# Patient Record
Sex: Female | Born: 1950 | Race: White | Hispanic: No | Marital: Single | Smoking: Current every day smoker
Health system: Southern US, Community
[De-identification: ages and names within clinical notes are randomized; demographics above are authoritative.]

## PROBLEM LIST (undated history)

## (undated) DIAGNOSIS — D696 Thrombocytopenia, unspecified: Secondary | ICD-10-CM

## (undated) DIAGNOSIS — J449 Chronic obstructive pulmonary disease, unspecified: Secondary | ICD-10-CM

## (undated) DIAGNOSIS — I509 Heart failure, unspecified: Secondary | ICD-10-CM

## (undated) DIAGNOSIS — E039 Hypothyroidism, unspecified: Secondary | ICD-10-CM

## (undated) DIAGNOSIS — I499 Cardiac arrhythmia, unspecified: Secondary | ICD-10-CM

## (undated) DIAGNOSIS — G250 Essential tremor: Secondary | ICD-10-CM

## (undated) DIAGNOSIS — N10 Acute tubulo-interstitial nephritis: Secondary | ICD-10-CM

## (undated) DIAGNOSIS — F419 Anxiety disorder, unspecified: Secondary | ICD-10-CM

## (undated) DIAGNOSIS — R509 Fever, unspecified: Secondary | ICD-10-CM

## (undated) DIAGNOSIS — Z8619 Personal history of other infectious and parasitic diseases: Secondary | ICD-10-CM

## (undated) DIAGNOSIS — J9611 Chronic respiratory failure with hypoxia: Secondary | ICD-10-CM

## (undated) DIAGNOSIS — R32 Unspecified urinary incontinence: Secondary | ICD-10-CM

## (undated) DIAGNOSIS — E785 Hyperlipidemia, unspecified: Secondary | ICD-10-CM

## (undated) DIAGNOSIS — N1330 Unspecified hydronephrosis: Secondary | ICD-10-CM

## (undated) DIAGNOSIS — L309 Dermatitis, unspecified: Secondary | ICD-10-CM

## (undated) DIAGNOSIS — Z8489 Family history of other specified conditions: Secondary | ICD-10-CM

## (undated) DIAGNOSIS — T8859XA Other complications of anesthesia, initial encounter: Secondary | ICD-10-CM

## (undated) DIAGNOSIS — T4145XA Adverse effect of unspecified anesthetic, initial encounter: Secondary | ICD-10-CM

## (undated) DIAGNOSIS — F319 Bipolar disorder, unspecified: Secondary | ICD-10-CM

## (undated) HISTORY — PX: OTHER SURGICAL HISTORY: SHX169

## (undated) HISTORY — PX: EYE SURGERY: SHX253

---

## 2008-07-06 ENCOUNTER — Ambulatory Visit (HOSPITAL_COMMUNITY): Admission: RE | Admit: 2008-07-06 | Discharge: 2008-07-06 | Payer: Self-pay | Admitting: Unknown Physician Specialty

## 2008-07-19 ENCOUNTER — Ambulatory Visit (HOSPITAL_COMMUNITY): Admission: RE | Admit: 2008-07-19 | Discharge: 2008-07-19 | Payer: Self-pay | Admitting: Unknown Physician Specialty

## 2013-12-02 HISTORY — PX: COLONOSCOPY: SHX174

## 2016-07-15 HISTORY — PX: CARDIAC CATHETERIZATION: SHX172

## 2016-08-18 ENCOUNTER — Other Ambulatory Visit (HOSPITAL_COMMUNITY): Payer: Self-pay | Admitting: Family Medicine

## 2016-08-18 DIAGNOSIS — Z1231 Encounter for screening mammogram for malignant neoplasm of breast: Secondary | ICD-10-CM

## 2016-08-31 ENCOUNTER — Encounter (HOSPITAL_COMMUNITY): Payer: Self-pay | Admitting: Radiology

## 2016-08-31 ENCOUNTER — Ambulatory Visit (HOSPITAL_COMMUNITY)
Admission: RE | Admit: 2016-08-31 | Discharge: 2016-08-31 | Disposition: A | Discharge status: Home / Self Care | Payer: Medicare Other | Source: Ambulatory Visit | Attending: Family Medicine | Admitting: Family Medicine

## 2016-08-31 DIAGNOSIS — Z1231 Encounter for screening mammogram for malignant neoplasm of breast: Secondary | ICD-10-CM | POA: Insufficient documentation

## 2017-08-05 ENCOUNTER — Emergency Department (HOSPITAL_COMMUNITY): Payer: Medicare Other

## 2017-08-05 ENCOUNTER — Other Ambulatory Visit: Payer: Self-pay

## 2017-08-05 ENCOUNTER — Inpatient Hospital Stay (HOSPITAL_COMMUNITY)
Admission: EM | Admit: 2017-08-05 | Discharge: 2017-08-08 | DRG: 193 | Disposition: A | Discharge status: Nursing Home (Includes ICF) | Payer: Medicare Other | Attending: Internal Medicine | Admitting: Internal Medicine

## 2017-08-05 DIAGNOSIS — J440 Chronic obstructive pulmonary disease with acute lower respiratory infection: Secondary | ICD-10-CM | POA: Diagnosis present

## 2017-08-05 DIAGNOSIS — R410 Disorientation, unspecified: Secondary | ICD-10-CM

## 2017-08-05 DIAGNOSIS — R4182 Altered mental status, unspecified: Secondary | ICD-10-CM | POA: Diagnosis present

## 2017-08-05 DIAGNOSIS — F319 Bipolar disorder, unspecified: Secondary | ICD-10-CM | POA: Diagnosis present

## 2017-08-05 DIAGNOSIS — G250 Essential tremor: Secondary | ICD-10-CM | POA: Diagnosis present

## 2017-08-05 DIAGNOSIS — G9341 Metabolic encephalopathy: Secondary | ICD-10-CM | POA: Diagnosis present

## 2017-08-05 DIAGNOSIS — J189 Pneumonia, unspecified organism: Secondary | ICD-10-CM | POA: Diagnosis not present

## 2017-08-05 DIAGNOSIS — J9622 Acute and chronic respiratory failure with hypercapnia: Secondary | ICD-10-CM | POA: Diagnosis present

## 2017-08-05 DIAGNOSIS — Y95 Nosocomial condition: Secondary | ICD-10-CM | POA: Diagnosis present

## 2017-08-05 DIAGNOSIS — J9621 Acute and chronic respiratory failure with hypoxia: Secondary | ICD-10-CM | POA: Diagnosis present

## 2017-08-05 DIAGNOSIS — F17200 Nicotine dependence, unspecified, uncomplicated: Secondary | ICD-10-CM | POA: Diagnosis present

## 2017-08-05 DIAGNOSIS — Z7982 Long term (current) use of aspirin: Secondary | ICD-10-CM

## 2017-08-05 DIAGNOSIS — Z79899 Other long term (current) drug therapy: Secondary | ICD-10-CM

## 2017-08-05 DIAGNOSIS — Z79890 Hormone replacement therapy (postmenopausal): Secondary | ICD-10-CM

## 2017-08-05 DIAGNOSIS — Z7951 Long term (current) use of inhaled steroids: Secondary | ICD-10-CM

## 2017-08-05 HISTORY — DX: Chronic obstructive pulmonary disease, unspecified: J44.9

## 2017-08-05 HISTORY — DX: Dermatitis, unspecified: L30.9

## 2017-08-05 HISTORY — DX: Cardiac arrhythmia, unspecified: I49.9

## 2017-08-05 HISTORY — DX: Hyperlipidemia, unspecified: E78.5

## 2017-08-05 HISTORY — DX: Anxiety disorder, unspecified: F41.9

## 2017-08-05 HISTORY — DX: Bipolar disorder, unspecified: F31.9

## 2017-08-05 HISTORY — DX: Unspecified urinary incontinence: R32

## 2017-08-05 LAB — COMPREHENSIVE METABOLIC PANEL
ALBUMIN: 3.5 g/dL (ref 3.5–5.0)
ALK PHOS: 43 U/L (ref 38–126)
ALT: 9 U/L — AB (ref 14–54)
AST: 16 U/L (ref 15–41)
Anion gap: 10 (ref 5–15)
BUN: 19 mg/dL (ref 6–20)
CHLORIDE: 105 mmol/L (ref 101–111)
CO2: 25 mmol/L (ref 22–32)
Calcium: 8.1 mg/dL — ABNORMAL LOW (ref 8.9–10.3)
Creatinine, Ser: 0.88 mg/dL (ref 0.44–1.00)
GFR calc Af Amer: >60 mL/min (ref >60)
GFR calc non Af Amer: >60 mL/min (ref >60)
GLUCOSE: 133 mg/dL — AB (ref 65–99)
Potassium: 3.9 mmol/L (ref 3.5–5.1)
SODIUM: 140 mmol/L (ref 135–145)
Total Bilirubin: 0.9 mg/dL (ref 0.3–1.2)
Total Protein: 5.9 g/dL — ABNORMAL LOW (ref 6.5–8.1)

## 2017-08-05 LAB — BLOOD GAS, ARTERIAL
Acid-Base Excess: 0 mmol/L (ref 0.0–2.0)
BICARBONATE: 23.4 mmol/L (ref 20.0–28.0)
DRAWN BY: 317771
O2 CONTENT: 4 L/min
O2 Saturation: 93.1 %
PO2 ART: 71.4 mmHg — AB (ref 83.0–108.0)
pCO2 arterial: 56.4 mmHg — ABNORMAL HIGH (ref 32.0–48.0)
pH, Arterial: 7.284 — ABNORMAL LOW (ref 7.350–7.450)

## 2017-08-05 LAB — I-STAT CHEM 8, ED
BUN: 18 mg/dL (ref 6–20)
CALCIUM ION: 1.13 mmol/L — AB (ref 1.15–1.40)
CHLORIDE: 104 mmol/L (ref 101–111)
CREATININE: 0.9 mg/dL (ref 0.44–1.00)
Glucose, Bld: 128 mg/dL — ABNORMAL HIGH (ref 65–99)
HEMATOCRIT: 36 % (ref 36.0–46.0)
Hemoglobin: 12.2 g/dL (ref 12.0–15.0)
POTASSIUM: 4 mmol/L (ref 3.5–5.1)
SODIUM: 142 mmol/L (ref 135–145)
TCO2: 25 mmol/L (ref 22–32)

## 2017-08-05 LAB — I-STAT TROPONIN, ED: Troponin i, poc: 0 ng/mL (ref 0.00–0.08)

## 2017-08-05 NOTE — ED Provider Notes (Signed)
[facility] EMERGENCY DEPARTMENT Provider Note   CSN: [id number] Arrival date & time: 08/05/17  2259   Level 5 caveat due to altered mental status  History   Chief Complaint Chief Complaint  Patient presents with  . Weakness    HPI Laurie Moreno is a 67 y.o. female.  The history is provided by the nursing home and the patient. The history is limited by the condition of the patient.  Weakness  This is a new problem. Episode onset: Unknown. The problem has not changed since onset.There was no focality noted. Associated symptoms include altered mental status.  Patient with history of COPD, tremor presents with altered mental status.  Per nursing facility, patient was checked on around 930 or 10pm and appeared confused and weak.  Last known well is unknown. Patient is usually ambulatory and interactive.  She is a smoker. Family at bedside reports that she stayed with them last week, and went back to the facility earlier today.  She had otherwise been well per family No recent falls reported, no new medications reported.    PMH-essential tremor, COPD Soc hx - smoker, lives in [place] OB History   None      Home Medications    Prior to Admission medications   Medication Sig Start Date End Date Taking? Authorizing Provider  aspirin 81 MG chewable tablet Chew 81 mg by mouth daily.   Yes [provider]  atorvastatin (LIPITOR) 40 MG tablet Take 40 mg by mouth daily.   Yes [provider]  carvedilol (COREG) 3.125 MG tablet Take 3.125 mg by mouth 2 (two) times daily with a meal.   Yes [provider]  clonazePAM (KLONOPIN) 0.5 MG tablet Take 0.5 mg by mouth 2 (two) times daily.   Yes [provider]  divalproex (DEPAKOTE) 250 MG DR tablet Take 250 mg by mouth 3 (three) times daily.   Yes [provider]  escitalopram (LEXAPRO) 10 MG tablet Take 10 mg by mouth daily.   Yes [provider]  gabapentin (NEURONTIN) 300 MG capsule  Take 300 mg by mouth 4 (four) times daily.   Yes [provider]  hydrOXYzine (ATARAX/VISTARIL) 25 MG tablet Take 25 mg by mouth every 8 (eight) hours as needed.   Yes [provider]  levothyroxine (SYNTHROID, LEVOTHROID) 100 MCG tablet Take 100 mcg by mouth daily before breakfast.   Yes [provider]  loratadine (CLARITIN) 10 MG tablet Take 10 mg by mouth daily.   Yes [provider]  Multiple Vitamin (MULTIVITAMIN) tablet Take 1 tablet by mouth daily.   Yes [provider]  primidone (MYSOLINE) 50 MG tablet Take 50 mg by mouth daily at 8 pm.   Yes [provider]  albuterol (PROVENTIL) (2.5 MG/3ML) 0.083% nebulizer solution Take 2.5 mg by nebulization every 6 (six) hours as needed for wheezing or shortness of breath.    [provider]  docusate sodium (COLACE) 100 MG capsule Take 100 mg by mouth daily as needed for mild constipation.    [provider]  ipratropium-albuterol (DUONEB) 0.5-2.5 (3) MG/3ML SOLN Take 3 mLs by nebulization every 6 (six) hours as needed.    [provider]  zinc oxide 20 % ointment Apply 1 application topically as needed for irritation.    [provider]    Family History No family history on file.  Social History Social History   Tobacco Use  . Smoking status: Not on file  Substance Use Topics  .  Alcohol use: Not on file  . Drug use: Not on file     Allergies   Patient has no allergy information on record.   Review of Systems Review of Systems  Unable to perform ROS: Mental status change  Neurological: Positive for weakness.     Physical Exam Updated Vital Signs BP 97/66   Pulse 90   Temp (!) 97.3 F (36.3 C) (Axillary)   Resp 19   Ht 1.575 m (5\' 2" )   Wt 86.2 kg (190 lb)   SpO2 (!) 85%   BMI 34.75 kg/m   Physical Exam  CONSTITUTIONAL: Elderly HEAD: Normocephalic/atraumatic EYES: Pupils pinpoint but equal, EOMI ENMT: Mucous membranes  moist, edentulous NECK: supple no meningeal signs SPINE/BACK:entire spine nontender CV: S1/S2 noted, no murmurs/rubs/gallops noted LUNGS: Crackles bilaterally ABDOMEN: soft, nontender GU:no cva tenderness NEURO: Pt is somnolent but arousable. She appears globally weak, but no focal weakness noted.  No obvious facial droop EXTREMITIES: pulses normal/equal, full ROM SKIN: warm, color normal PSYCH: Unable to assess  ED Treatments / Results  Labs (all labs ordered are listed, but only abnormal results are displayed) Labs Reviewed  CBC - Abnormal; Notable for the following components:      Result Value   Platelets 84 (*)    All other components within normal limits  DIFFERENTIAL - Abnormal; Notable for the following components:   Neutro Abs 0.9 (*)    All other components within normal limits  COMPREHENSIVE METABOLIC PANEL - Abnormal; Notable for the following components:   Glucose, Bld 133 (*)    Calcium 8.1 (*)    Total Protein 5.9 (*)    ALT 9 (*)    All other components within normal limits  BLOOD GAS, ARTERIAL - Abnormal; Notable for the following components:   pH, Arterial 7.284 (*)    pCO2 arterial 56.4 (*)    pO2, Arterial 71.4 (*)    All other components within normal limits  AMMONIA - Abnormal; Notable for the following components:   Ammonia 40 (*)    All other components within normal limits  VALPROIC ACID LEVEL - Abnormal; Notable for the following components:   Valproic Acid Lvl 35 (*)    All other components within normal limits  LACTIC ACID, PLASMA - Abnormal; Notable for the following components:   Lactic Acid, Venous 2.1 (*)    All other components within normal limits  I-STAT CHEM 8, ED - Abnormal; Notable for the following components:   Glucose, Bld 128 (*)    Calcium, Ion 1.13 (*)    All other components within normal limits  ETHANOL  PROTIME-INR  APTT  RAPID URINE DRUG SCREEN, HOSP PERFORMED  URINALYSIS, ROUTINE W REFLEX MICROSCOPIC  LACTIC ACID,  PLASMA  I-STAT TROPONIN, ED    EKG EKG Interpretation  Date/Time:  Thursday August 05 2017 23:02:25 EDT Ventricular Rate:  89 PR Interval:    QRS Duration: 92 QT Interval:  370 QTC Calculation: 451 R Axis:   -39 Text Interpretation:  Sinus rhythm Left axis deviation Anterior infarct, old No previous ECGs available Confirmed by Ripley Fraise [phone]) on 08/05/2017 11:12:44 PM   Radiology Dg Chest 1 View  Result Date: 08/05/2017 CLINICAL DATA:  Confusion EXAM: CHEST  1 VIEW COMPARISON:  Report 04/04/2015 FINDINGS: Scoliosis of the spine. Patchy atelectasis or minimal infiltrate at the left base. No large effusion. Mild cardiomegaly. No pneumothorax. IMPRESSION: Patchy atelectasis or minimal infiltrate at the left base. Mild cardiomegaly. Electronically Signed   By: Maudie Mercury  Francoise Ceo M.D.   On: 08/05/2017 23:49   Ct Head Wo Contrast  Result Date: 08/05/2017 CLINICAL DATA:  Altered LOC EXAM: CT HEAD WITHOUT CONTRAST TECHNIQUE: Contiguous axial images were obtained from the base of the skull through the vertex without intravenous contrast. COMPARISON:  None. FINDINGS: Brain: No acute territorial infarction, hemorrhage or intracranial mass is visualized. Mild atrophy. Normal ventricle size. Vascular: No hyperdense vessel or unexpected calcification. Skull: Normal. Negative for fracture or focal lesion. Sinuses/Orbits: Mucosal thickening in the sphenoid sinus. No acute orbital abnormality Other: None IMPRESSION: No CT evidence for acute intracranial abnormality.  Mild atrophy Electronically Signed   By: Donavan Foil M.D.   On: 08/05/2017 23:49    Procedures Procedures  CRITICAL CARE Performed by: Sharyon Cable Total critical care time: 40 minutes Critical care time was exclusive of separately billable procedures and treating other patients. Critical care was necessary to treat or prevent imminent or life-threatening deterioration. Critical care was time spent personally by me on the  following activities: development of treatment plan with patient and/or surrogate as well as nursing, discussions with consultants, evaluation of patient's response to treatment, examination of patient, obtaining history from patient or surrogate, ordering and performing treatments and interventions, ordering and review of laboratory studies, ordering and review of radiographic studies, pulse oximetry and re-evaluation of patient's condition. Patient on BiPAP, requiring frequent reassessments, requiring IV fluids and IV antibiotics Medications Ordered in ED Medications  sodium chloride 0.9 % bolus 1,000 mL (1,000 mLs Intravenous New Bag/Given 08/06/17 0121)  aztreonam (AZACTAM) 2 g in sodium chloride 0.9 % 100 mL IVPB (has no administration in time range)  ipratropium-albuterol (DUONEB) 0.5-2.5 (3) MG/3ML nebulizer solution 3 mL (3 mLs Nebulization Given 08/06/17 0017)     Initial Impression / Assessment and Plan / ED Course  I have reviewed the triage vital signs and the nursing notes.  Pertinent labs & imaging results that were available during my care of the patient were reviewed by me and considered in my medical decision making (see chart for details).     11:56 PM Patient presents for altered mental status.  There was initial concern for stroke, but on my exam there is no focal weakness.  She appears somnolent and no focal weakness noted Workup pending at this time tPA in stroke considered but not given due to: Onset over 3-4.5hours Per my discussion with the nursing facility, it is unclear when this began. 1:07 AM CT Head is negative.  I reviewed this personally. Stroke is still a possibility, but this is unclear at this time.  Patient is somnolent but arousable.  No focal weakness noted. Unclear cause of altered mental status.  She has some elevation of pneumonia, also some hypercapnia.  Did have some initial hypoxia, that is improved and she is not normally on oxygen-therefore will  place on BiPAP for now. 1:48 AM Patient tolerating BiPAP, no acute distress, somnolent but easily arousable. IV fluids are infusing, and have also ordered IV antibiotics for potential pneumonia.  Still unclear cause of altered mental status.  Could be due to medications, she receives at the nursing facility  Discussed the case with Dr. Darrick Meigs for admission  Final Clinical Impressions(s) / ED Diagnoses   Final diagnoses:  HCAP (healthcare-associated pneumonia)  Delirium    ED Discharge Orders    None       Ripley Fraise, MD 08/06/17 0150

## 2017-08-05 NOTE — ED Triage Notes (Signed)
Pt from [facility], reports last rounds at 2130 pt was fine, reports left sided weakness, unable to stand, left facial droop, slurred speech which is normally normal, per EMS pupils reactive but sluggish but restricted, 116/67, CBG: 139

## 2017-08-06 ENCOUNTER — Encounter (HOSPITAL_COMMUNITY): Payer: Self-pay

## 2017-08-06 ENCOUNTER — Observation Stay (HOSPITAL_COMMUNITY): Payer: Medicare Other

## 2017-08-06 DIAGNOSIS — Z7951 Long term (current) use of inhaled steroids: Secondary | ICD-10-CM | POA: Diagnosis not present

## 2017-08-06 DIAGNOSIS — R410 Disorientation, unspecified: Secondary | ICD-10-CM

## 2017-08-06 DIAGNOSIS — J9622 Acute and chronic respiratory failure with hypercapnia: Secondary | ICD-10-CM | POA: Diagnosis present

## 2017-08-06 DIAGNOSIS — R4182 Altered mental status, unspecified: Secondary | ICD-10-CM | POA: Diagnosis not present

## 2017-08-06 DIAGNOSIS — Z7982 Long term (current) use of aspirin: Secondary | ICD-10-CM | POA: Diagnosis not present

## 2017-08-06 DIAGNOSIS — Y95 Nosocomial condition: Secondary | ICD-10-CM | POA: Diagnosis present

## 2017-08-06 DIAGNOSIS — G9341 Metabolic encephalopathy: Secondary | ICD-10-CM | POA: Diagnosis present

## 2017-08-06 DIAGNOSIS — J189 Pneumonia, unspecified organism: Principal | ICD-10-CM

## 2017-08-06 DIAGNOSIS — Z79899 Other long term (current) drug therapy: Secondary | ICD-10-CM | POA: Diagnosis not present

## 2017-08-06 DIAGNOSIS — J9621 Acute and chronic respiratory failure with hypoxia: Secondary | ICD-10-CM | POA: Diagnosis present

## 2017-08-06 DIAGNOSIS — G250 Essential tremor: Secondary | ICD-10-CM | POA: Diagnosis present

## 2017-08-06 DIAGNOSIS — F17200 Nicotine dependence, unspecified, uncomplicated: Secondary | ICD-10-CM | POA: Diagnosis present

## 2017-08-06 DIAGNOSIS — J440 Chronic obstructive pulmonary disease with acute lower respiratory infection: Secondary | ICD-10-CM | POA: Diagnosis present

## 2017-08-06 DIAGNOSIS — Z79890 Hormone replacement therapy (postmenopausal): Secondary | ICD-10-CM | POA: Diagnosis not present

## 2017-08-06 DIAGNOSIS — F319 Bipolar disorder, unspecified: Secondary | ICD-10-CM | POA: Diagnosis present

## 2017-08-06 LAB — CBC
HCT: 39.5 % (ref 36.0–46.0)
HEMATOCRIT: 38.6 % (ref 36.0–46.0)
HEMOGLOBIN: 12.6 g/dL (ref 12.0–15.0)
HEMOGLOBIN: 12.3 g/dL (ref 12.0–15.0)
MCH: 30.9 pg (ref 26.0–34.0)
MCH: 31.5 pg (ref 26.0–34.0)
MCHC: 31.9 g/dL (ref 30.0–36.0)
MCHC: 31.9 g/dL (ref 30.0–36.0)
MCV: 96.8 fL (ref 78.0–100.0)
MCV: 98.7 fL (ref 78.0–100.0)
Platelets: 84 10*3/uL — ABNORMAL LOW (ref 150–400)
Platelets: 78 10*3/uL — ABNORMAL LOW (ref 150–400)
RBC: 4.08 MIL/uL (ref 3.87–5.11)
RBC: 3.91 MIL/uL (ref 3.87–5.11)
RDW: 13.1 % (ref 11.5–15.5)
RDW: 13.3 % (ref 11.5–15.5)
WBC: 4.4 10*3/uL (ref 4.0–10.5)
WBC: 3.2 10*3/uL — ABNORMAL LOW (ref 4.0–10.5)

## 2017-08-06 LAB — RAPID URINE DRUG SCREEN, HOSP PERFORMED
Amphetamines: NOT DETECTED
BARBITURATES: POSITIVE — AB
Benzodiazepines: NOT DETECTED
Cocaine: NOT DETECTED
Opiates: NOT DETECTED
TETRAHYDROCANNABINOL: NOT DETECTED

## 2017-08-06 LAB — URINALYSIS, ROUTINE W REFLEX MICROSCOPIC
BILIRUBIN URINE: NEGATIVE
Bacteria, UA: NONE SEEN
GLUCOSE, UA: NEGATIVE mg/dL
KETONES UR: NEGATIVE mg/dL
LEUKOCYTES UA: NEGATIVE
NITRITE: NEGATIVE
PH: 5 (ref 5.0–8.0)
Protein, ur: NEGATIVE mg/dL
Specific Gravity, Urine: 1.009 (ref 1.005–1.030)

## 2017-08-06 LAB — COMPREHENSIVE METABOLIC PANEL
ALT: 9 U/L — AB (ref 14–54)
AST: 14 U/L — AB (ref 15–41)
Albumin: 3.3 g/dL — ABNORMAL LOW (ref 3.5–5.0)
Alkaline Phosphatase: 42 U/L (ref 38–126)
Anion gap: 11 (ref 5–15)
BUN: 19 mg/dL (ref 6–20)
CHLORIDE: 109 mmol/L (ref 101–111)
CO2: 25 mmol/L (ref 22–32)
Calcium: 7.6 mg/dL — ABNORMAL LOW (ref 8.9–10.3)
Creatinine, Ser: 0.77 mg/dL (ref 0.44–1.00)
Glucose, Bld: 127 mg/dL — ABNORMAL HIGH (ref 65–99)
Potassium: 4.2 mmol/L (ref 3.5–5.1)
SODIUM: 145 mmol/L (ref 135–145)
Total Bilirubin: 0.6 mg/dL (ref 0.3–1.2)
Total Protein: 5.8 g/dL — ABNORMAL LOW (ref 6.5–8.1)

## 2017-08-06 LAB — DIFFERENTIAL
Basophils Absolute: 0 10*3/uL (ref 0.0–0.1)
Basophils Relative: 0 %
EOS ABS: 0.1 10*3/uL (ref 0.0–0.7)
EOS PCT: 2 %
LYMPHS ABS: 2.9 10*3/uL (ref 0.7–4.0)
LYMPHS PCT: 65 %
MONO ABS: 0.6 10*3/uL (ref 0.1–1.0)
Monocytes Relative: 13 %
Neutro Abs: 0.9 10*3/uL — ABNORMAL LOW (ref 1.7–7.7)
Neutrophils Relative %: 20 %

## 2017-08-06 LAB — AMMONIA: Ammonia: 40 umol/L — ABNORMAL HIGH (ref 9–35)

## 2017-08-06 LAB — LACTIC ACID, PLASMA
LACTIC ACID, VENOUS: 2.1 mmol/L — AB (ref 0.5–1.9)
Lactic Acid, Venous: 2.4 mmol/L (ref 0.5–1.9)

## 2017-08-06 LAB — PROTIME-INR
INR: 1.14
Prothrombin Time: 14.5 seconds (ref 11.4–15.2)

## 2017-08-06 LAB — APTT: APTT: 30 s (ref 24–36)

## 2017-08-06 LAB — VALPROIC ACID LEVEL: Valproic Acid Lvl: 35 ug/mL — ABNORMAL LOW (ref 50.0–100.0)

## 2017-08-06 LAB — MRSA PCR SCREENING: MRSA by PCR: NEGATIVE

## 2017-08-06 LAB — ETHANOL: Alcohol, Ethyl (B): <10 mg/dL (ref <10)

## 2017-08-06 MED ORDER — CARVEDILOL 3.125 MG PO TABS
3.1250 mg | ORAL_TABLET | Freq: Two times a day (BID) | ORAL | Status: DC
  Administered 2017-08-06 – 2017-08-08 (×6): 3.125 mg via ORAL
  Filled 2017-08-06 (×6): qty 1

## 2017-08-06 MED ORDER — SODIUM CHLORIDE 0.9 % IV SOLN
INTRAVENOUS | Status: DC
  Administered 2017-08-06 – 2017-08-07 (×5): via INTRAVENOUS

## 2017-08-06 MED ORDER — ENOXAPARIN SODIUM 40 MG/0.4ML ~~LOC~~ SOLN
40.0000 mg | SUBCUTANEOUS | Status: DC
  Administered 2017-08-06 – 2017-08-08 (×3): 40 mg via SUBCUTANEOUS
  Filled 2017-08-06 (×3): qty 0.4

## 2017-08-06 MED ORDER — SODIUM CHLORIDE 0.9 % IV SOLN
1500.0000 mg | Freq: Once | INTRAVENOUS | Status: AC
  Administered 2017-08-06: 1500 mg via INTRAVENOUS
  Filled 2017-08-06 (×2): qty 1500

## 2017-08-06 MED ORDER — SODIUM CHLORIDE 0.9 % IV BOLUS
1000.0000 mL | Freq: Once | INTRAVENOUS | Status: AC
  Administered 2017-08-06: 1000 mL via INTRAVENOUS

## 2017-08-06 MED ORDER — ASPIRIN 81 MG PO CHEW
81.0000 mg | CHEWABLE_TABLET | Freq: Every day | ORAL | Status: DC
  Administered 2017-08-07 – 2017-08-08 (×2): 81 mg via ORAL
  Filled 2017-08-06 (×2): qty 1

## 2017-08-06 MED ORDER — IPRATROPIUM-ALBUTEROL 0.5-2.5 (3) MG/3ML IN SOLN: 3.0000 mL | Freq: Four times a day (QID) | RESPIRATORY_TRACT | Status: DC | PRN

## 2017-08-06 MED ORDER — ADULT MULTIVITAMIN W/MINERALS CH
1.0000 | ORAL_TABLET | Freq: Every day | ORAL | Status: DC
  Administered 2017-08-06 – 2017-08-08 (×3): 1 via ORAL
  Filled 2017-08-06 (×3): qty 1

## 2017-08-06 MED ORDER — LEVOTHYROXINE SODIUM 100 MCG PO TABS
100.0000 ug | ORAL_TABLET | Freq: Every day | ORAL | Status: DC
  Administered 2017-08-06 – 2017-08-08 (×3): 100 ug via ORAL
  Filled 2017-08-06 (×3): qty 1

## 2017-08-06 MED ORDER — LORATADINE 10 MG PO TABS
10.0000 mg | ORAL_TABLET | Freq: Every day | ORAL | Status: DC
  Administered 2017-08-06 – 2017-08-08 (×3): 10 mg via ORAL
  Filled 2017-08-06 (×3): qty 1

## 2017-08-06 MED ORDER — SODIUM CHLORIDE 0.9 % IV SOLN
2.0000 g | Freq: Three times a day (TID) | INTRAVENOUS | Status: DC
  Administered 2017-08-06 – 2017-08-07 (×4): 2 g via INTRAVENOUS
  Filled 2017-08-06 (×10): qty 2

## 2017-08-06 MED ORDER — VANCOMYCIN HCL 10 G IV SOLR
1250.0000 mg | Freq: Two times a day (BID) | INTRAVENOUS | Status: DC
  Filled 2017-08-06 (×6): qty 1250

## 2017-08-06 MED ORDER — ACETAMINOPHEN 325 MG PO TABS
650.0000 mg | ORAL_TABLET | Freq: Four times a day (QID) | ORAL | Status: DC | PRN
  Administered 2017-08-06 – 2017-08-07 (×4): 650 mg via ORAL
  Filled 2017-08-06 (×5): qty 2

## 2017-08-06 MED ORDER — ACETAMINOPHEN 650 MG RE SUPP: 650.0000 mg | Freq: Four times a day (QID) | RECTAL | Status: DC | PRN

## 2017-08-06 MED ORDER — IPRATROPIUM-ALBUTEROL 0.5-2.5 (3) MG/3ML IN SOLN
3.0000 mL | Freq: Once | RESPIRATORY_TRACT | Status: AC
  Administered 2017-08-06: 3 mL via RESPIRATORY_TRACT
  Filled 2017-08-06: qty 3

## 2017-08-06 MED ORDER — SODIUM CHLORIDE 0.9 % IV SOLN
2.0000 g | Freq: Once | INTRAVENOUS | Status: AC
  Administered 2017-08-06: 2 g via INTRAVENOUS
  Filled 2017-08-06: qty 2

## 2017-08-06 MED ORDER — ONDANSETRON HCL 4 MG PO TABS: 4.0000 mg | ORAL_TABLET | Freq: Four times a day (QID) | ORAL | Status: DC | PRN

## 2017-08-06 MED ORDER — ATORVASTATIN CALCIUM 40 MG PO TABS
40.0000 mg | ORAL_TABLET | Freq: Every day | ORAL | Status: DC
  Administered 2017-08-06 – 2017-08-08 (×3): 40 mg via ORAL
  Filled 2017-08-06 (×3): qty 1

## 2017-08-06 MED ORDER — SODIUM CHLORIDE 0.9 % IV SOLN
INTRAVENOUS | Status: AC
  Administered 2017-08-06: 2 g via INTRAVENOUS
  Filled 2017-08-06: qty 2

## 2017-08-06 MED ORDER — ONDANSETRON HCL 4 MG/2ML IJ SOLN: 4.0000 mg | Freq: Four times a day (QID) | INTRAMUSCULAR | Status: DC | PRN

## 2017-08-06 MED ORDER — PRIMIDONE 50 MG PO TABS
50.0000 mg | ORAL_TABLET | Freq: Every day | ORAL | Status: DC
  Administered 2017-08-06 – 2017-08-07 (×3): 50 mg via ORAL
  Filled 2017-08-06 (×5): qty 1

## 2017-08-06 MED ORDER — DIVALPROEX SODIUM 250 MG PO DR TAB
250.0000 mg | DELAYED_RELEASE_TABLET | Freq: Three times a day (TID) | ORAL | Status: DC
  Administered 2017-08-06 – 2017-08-08 (×8): 250 mg via ORAL
  Filled 2017-08-06 (×8): qty 1

## 2017-08-06 MED ORDER — ESCITALOPRAM OXALATE 10 MG PO TABS
10.0000 mg | ORAL_TABLET | Freq: Every day | ORAL | Status: DC
  Administered 2017-08-06 – 2017-08-08 (×3): 10 mg via ORAL
  Filled 2017-08-06 (×3): qty 1

## 2017-08-06 NOTE — Progress Notes (Signed)
Pharmacy Antibiotic Note  Laura Caldas is a 67 y.o. female admitted on 08/05/2017 with pneumonia.  Pharmacy has been consulted for Aztreonam and Vancomycin dosing.  Plan: Vancomycin 1250mg  IV every 12 hours.  Goal trough 15-20 mcg/mL.  Aztreonam 2gm IV q8h F/U cxs and clinical progress Monitor V/S, labs and levels as indicated  Height: 5\' 2"  (157.5 cm) Weight: 200 lb 9.9 oz (91 kg) IBW/kg (Calculated) : 50.1  Temp (24hrs), Avg:97.3 F (36.3 C), Min:97 F (36.1 C), Max:97.6 F (36.4 C)  Recent Labs  Lab 08/05/17 2306 08/05/17 2314 08/05/17 2352 08/06/17 0233 08/06/17 0400  WBC 4.4  --   --   --  3.2*  CREATININE 0.88 0.90  --   --  0.77  LATICACIDVEN  --   --  2.1* 2.4*  --     Estimated Creatinine Clearance: 72.6 mL/min (by C-G formula based on SCr of 0.77 mg/dL).    Allergies  Allergen Reactions  . Codeine Nausea And Vomiting    Other reaction(s): GI Upset (intolerance)  . Penicillins Hives and Rash    Not effective per patient Pt does not know. She states it was when she was a child. unsure     Antimicrobials this admission: Vancomycin 4/5 >>   Aztreonam 4/5 >>   Dose adjustments this admission: N/A Microbiology results: 4/5 BCx: pending 4/5 MRSA PCR: negative  Thank you for allowing pharmacy to be a part of this patient's care.  Isac Sarna, BS Pharm D, BCPS Clinical Pharmacist Pager [phone] 08/06/2017 8:00 AM

## 2017-08-06 NOTE — ED Notes (Signed)
Purewick placed on pt at this time.  °

## 2017-08-06 NOTE — ED Notes (Signed)
Date and time results received: 08/06/17  3:17 AM   Test: lactic acid Critical Value: 2.4 mmol/L  Name of Provider Notified: wickline  Orders Received? Or Actions Taken?: n/a

## 2017-08-06 NOTE — ED Notes (Signed)
Date and time results received: 08/06/17 0120 (use smartphrase ".now" to insert current time)  Test: Lactic Critical Value: 2.1  Name of Provider Notified: Wickline  Orders Received? Or Actions Taken?:

## 2017-08-06 NOTE — Clinical Social Work Note (Signed)
Clinical Social Work Assessment  Patient Details  Name: Laurie Moreno MRN: [id number] Date of Birth: [birth year]  Date of referral:  08/06/17               Reason for consult:  Discharge Planning                Permission sought to share information with:    Permission granted to share information::     Name::        Agency::     Relationship::     Contact Information:     Housing/Transportation Living arrangements for the past 2 months:  [place] of Information:  Facility Patient Interpreter Needed:  None Criminal Activity/Legal Involvement Pertinent to Current Situation/Hospitalization:  No - Comment as needed Significant Relationships:  Siblings Lives with:  Facility Resident Do you feel safe going back to the place where you live?  Yes Need for family participation in patient care:  Yes (Comment)  Care giving concerns: Pt resides at at ALF.   Social Worker assessment / plan: Pt is a 67 year old female admitted from [place]. Attempted to meet with pt this AM though she was gone for a test. Spoke with pt's sister briefly. Pt's sister states that pt has been at the ALF for about a year. [place] and spoke with staff who state that they assist pt with bathing and dressing. Pt is independent in other ADL's.   The ALF will need a new FL2 at dc. If pt is discharged on the weekend and is on any new medications, she will need to return to the facility with enough supply to get her through until Monday when the ALF pharmacy re-opens. Will update [facility] staff of this need. The ALF staff will transport pt at dc. Weekend CSW will be available as needed. Will follow up Monday if pt does not dc over the weekend.  Employment status:  Retired Nurse, adult PT Recommendations:  Not assessed at this time Information / Referral to community resources:     Patient/Family's Response to care: Pt and family accepting of  care.  Patient/Family's Understanding of and Emotional Response to Diagnosis, Current Treatment, and Prognosis: Unable to assess pt at this time but pt's RN stated that she appears to have a good understanding of her treatment plan and no emotional distress was identified.  Emotional Assessment Appearance:  Appears stated age Attitude/Demeanor/Rapport:    Affect (typically observed):  Calm Orientation:  Oriented to Self, Oriented to Place, Oriented to  Time, Oriented to Situation Alcohol / Substance use:  Not Applicable Psych involvement (Current and /or in the community):     Discharge Needs  Concerns to be addressed:  Discharge Planning Concerns Readmission within the last 30 days:  No Current discharge risk:  None Barriers to Discharge:      Shade Flood, LCSW 08/06/2017, 11:23 AM

## 2017-08-06 NOTE — H&P (Signed)
[facility] H&P    Patient Demographics:    Laurie Moreno, is a 67 y.o. female  MRN: [id number]  DOB - 09/24/1950  Admit Date - 08/05/2017  Referring MD/NP/PA: Dr. Christy Gentles   Outpatient Primary MD for the patient is Futrell, Gildardo Griffes., MD  Patient coming from: Skilled nursing facility  Chief complaint- altered mental status   HPI:    Laurie Moreno  is a 67 y.o. female, with history of COPD, tremors presents with altered mental status.   Per nursing facility patient was found to be confused around 10 PM.  Earlier patient's sister checked her in the skilled facility around 1 PM when she was fine.  Patient was found to be in acute hypoxic and hypercapnic respiratory failure and put on BiPAP.  She has improved but unable to provide any significant history. Chest x-ray showed possible infiltrate and patient started on vancomycin and Azactam.     Review of systems:     Review of systems is unobtainable due to patient's altered mental status.   With Past History of the following :       Social History:      Social History   Tobacco Use  . Smoking status: Not on file  Substance Use Topics  . Alcohol use: Not on file       Family History :   No family history obtainable   Home Medications:   Prior to Admission medications   Medication Sig Start Date End Date Taking? Authorizing Provider  aspirin 81 MG chewable tablet Chew 81 mg by mouth daily.   Yes [provider]  atorvastatin (LIPITOR) 40 MG tablet Take 40 mg by mouth daily.   Yes [provider]  carvedilol (COREG) 3.125 MG tablet Take 3.125 mg by mouth 2 (two) times daily with a meal.   Yes [provider]  clonazePAM (KLONOPIN) 0.5 MG tablet Take 0.5 mg by mouth 2 (two) times daily.   Yes [provider]  divalproex (DEPAKOTE) 250 MG DR tablet Take 250 mg by mouth 3 (three) times daily.   Yes [provider]  escitalopram (LEXAPRO) 10 MG tablet Take 10 mg by mouth daily.   Yes [provider]  gabapentin (NEURONTIN) 300 MG capsule Take 300 mg by mouth 4 (four) times daily.   Yes [provider]  hydrOXYzine (ATARAX/VISTARIL) 25 MG tablet Take 25 mg by mouth every 8 (eight) hours as needed.   Yes [provider]  levothyroxine (SYNTHROID, LEVOTHROID) 100 MCG tablet Take 100 mcg by mouth daily before breakfast.   Yes [provider]  loratadine (CLARITIN) 10 MG tablet Take 10 mg by mouth daily.   Yes [provider]  Multiple Vitamin (MULTIVITAMIN) tablet Take 1 tablet by mouth daily.   Yes [provider]  primidone (MYSOLINE) 50 MG tablet Take 50 mg by mouth daily at 8 pm.   Yes [provider]  albuterol (PROVENTIL) (2.5 MG/3ML) 0.083% nebulizer solution Take 2.5 mg by nebulization every 6 (six) hours as needed for wheezing or  shortness of breath.    [provider]  docusate sodium (COLACE) 100 MG capsule Take 100 mg by mouth daily as needed for mild constipation.    [provider]  ipratropium-albuterol (DUONEB) 0.5-2.5 (3) MG/3ML SOLN Take 3 mLs by nebulization every 6 (six) hours as needed.    [provider]  zinc oxide 20 % ointment Apply 1 application topically as needed for irritation.    [provider]     Allergies:     Allergies  Allergen Reactions  . Codeine Nausea And Vomiting    Other reaction(s): GI Upset (intolerance)  . Penicillins Hives and Rash    Not effective per patient Pt does not know. She states it was when she was a child. unsure      Physical Exam:   Vitals  Blood pressure 104/64, pulse 82, temperature (!) 97 F (36.1 C), temperature source Rectal, resp. rate 14, height 5\' 2"  (1.575 m), weight 86.2 kg (190 lb), SpO2 95 %.  1.  General: Patient is currently on BiPAP  2. Psychiatric: Not tested  3. Neurologic: No focal neurological  deficits, all cranial nerves intact.Strength 5/5 all 4 extremities, sensation intact all 4 extremities, plantars down going.  4. Eyes :  anicteric sclerae, moist conjunctivae with no lid lag. PERRLA.  5. ENMT:  On BiPAP  6. Neck:  supple, no cervical lymphadenopathy appriciated, No thyromegaly  7. Respiratory : Normal respiratory effort, good air movement bilaterally,clear to  auscultation bilaterally  8. Cardiovascular : RRR, no gallops, rubs or murmurs, no leg edema  9. Gastrointestinal:  Positive bowel sounds, abdomen soft, non-tender to palpation,no hepatosplenomegaly, no rigidity or guarding       10. Skin:  No cyanosis, normal texture and turgor, no rash, lesions or ulcers  11.Musculoskeletal:  Good muscle tone,  joints appear normal , no effusions,  normal range of motion    Data Review:    CBC Recent Labs  Lab 08/05/17 2306 08/05/17 2314  WBC 4.4  --   HGB 12.6 12.2  HCT 39.5 36.0  PLT 84*  --   MCV 96.8  --   MCH 30.9  --   MCHC 31.9  --   RDW 13.1  --   LYMPHSABS 2.9  --   MONOABS 0.6  --   EOSABS 0.1  --   BASOSABS 0.0  --    ------------------------------------------------------------------------------------------------------------------  Chemistries  Recent Labs  Lab 08/05/17 2306 08/05/17 2314  NA 140 142  K 3.9 4.0  CL 105 104  CO2 25  --   GLUCOSE 133* 128*  BUN 19 18  CREATININE 0.88 0.90  CALCIUM 8.1*  --   AST 16  --   ALT 9*  --   ALKPHOS 43  --   BILITOT 0.9  --    ------------------------------------------------------------------------------------------------------------------  ------------------------------------------------------------------------------------------------------------------ GFR: Estimated Creatinine Clearance: 62.6 mL/min (by C-G formula based on SCr of 0.9 mg/dL). Liver Function Tests: Recent Labs  Lab 08/05/17 2306  AST 16  ALT 9*  ALKPHOS 43  BILITOT 0.9  PROT 5.9*  ALBUMIN 3.5   No results  for input(s): LIPASE, AMYLASE in the last 168 hours. Recent Labs  Lab 08/05/17 2352  AMMONIA 40*   Coagulation Profile: Recent Labs  Lab 08/05/17 2306  INR 1.14    --------------------------------------------------------------------------------------------------------------- Urine analysis: No results found for: COLORURINE, APPEARANCEUR, LABSPEC, PHURINE, GLUCOSEU, HGBUR, BILIRUBINUR, KETONESUR, PROTEINUR, UROBILINOGEN, NITRITE, LEUKOCYTESUR    Imaging Results:    Dg Chest 1 View  Result Date: 08/05/2017 CLINICAL DATA:  Confusion EXAM: CHEST  1 VIEW COMPARISON:  Report 04/04/2015 FINDINGS: Scoliosis of the spine. Patchy atelectasis or minimal infiltrate at the left base. No large effusion. Mild cardiomegaly. No pneumothorax. IMPRESSION: Patchy atelectasis or minimal infiltrate at the left base. Mild cardiomegaly. Electronically Signed   By: Donavan Foil M.D.   On: 08/05/2017 23:49   Ct Head Wo Contrast  Result Date: 08/05/2017 CLINICAL DATA:  Altered LOC EXAM: CT HEAD WITHOUT CONTRAST TECHNIQUE: Contiguous axial images were obtained from the base of the skull through the vertex without intravenous contrast. COMPARISON:  None. FINDINGS: Brain: No acute territorial infarction, hemorrhage or intracranial mass is visualized. Mild atrophy. Normal ventricle size. Vascular: No hyperdense vessel or unexpected calcification. Skull: Normal. Negative for fracture or focal lesion. Sinuses/Orbits: Mucosal thickening in the sphenoid sinus. No acute orbital abnormality Other: None IMPRESSION: No CT evidence for acute intracranial abnormality.  Mild atrophy Electronically Signed   By: Donavan Foil M.D.   On: 08/05/2017 23:49    My personal review of EKG: Rhythm NSR   Assessment & Plan:    Active Problems:   Altered mental status  HCAP     1. Healthcare associated pneumonia-chest x-ray showed infiltrate at left base, patient started on vancomycin and Azactam per pharmacy.  Follow blood culture  results.   2. Acute hypoxic/hypercapnic respiratory failure-suspect multifactorial from pneumonia, polypharmacy.  Continue BiPAP 3. Altered mental status-likely from polypharmacy, CT head is negative for stroke.  Will obtain MRI brain in a.m.  Hold gabapentin, clonazepam, Atarax.  Ammonia level is 40, Depakote level is 35 4. Tremors-continue primidone 5. Bipolar disorder-continue Depakote     DVT Prophylaxis-   Lovenox   AM Labs Ordered, also please review Full Orders  Family Communication: Admission, patients condition and plan of care including tests being ordered have been discussed with the patient's sister at bedside who indicate understanding and agree with the plan and Code Status.  Code Status: Full code  Admission status: Observation  Time spent in minutes : 60 minutes   Oswald Hillock M.D on 08/06/2017 at 3:18 AM  Between 7am to 7pm - Pager - [phone]. After 7pm go to [email or web address] - password [facility]  [facility] - Office  [phone]

## 2017-08-06 NOTE — Progress Notes (Signed)
Patient seen and examined, database reviewed.  Discussed with brother and sister at bedside with patient's permission.  Patient was sent from her ALF yesterday due to confusion and shortness of breath.  Was found to have acute hypoxemic respiratory failure due to pneumonia.  She was started on vancomycin and aztreonam.  Did require noninvasive positive pressure ventilation transiently.  She has been weaned off BiPAP.  MRSA PCR has subsequently resulted negative so we will discontinue vancomycin and continue aztreonam only.  She appears generally well, is saturating 99% on 4 L of oxygen.  We will monitor in stepdown unit another night in case she needs to use BiPAP again, although doubt this.  We will continue to follow closely.  Do not believe she has had a CVA, MRI brain is negative, will discontinue neurochecks and advance diet.  Domingo Mend, MD [facility] Pager: [phone]

## 2017-08-06 NOTE — ED Notes (Signed)
Report to [facility]

## 2017-08-06 NOTE — Progress Notes (Signed)
ANTIBIOTIC CONSULT NOTE-Preliminary  Pharmacy Consult for Vancomycin Indication: Pneumonia  Allergies  Allergen Reactions  . Codeine Nausea And Vomiting    Other reaction(s): GI Upset (intolerance)  . Penicillins Hives and Rash    Not effective per patient Pt does not know. She states it was when she was a child. unsure     Patient Measurements: Height: 5\' 2"  (157.5 cm) Weight: 190 lb (86.2 kg) IBW/kg (Calculated) : 50.1  Vital Signs: Temp: 97 F (36.1 C) (04/05 0001) Temp Source: Rectal (04/05 0001) BP: 90/57 (04/05 0130) Pulse Rate: 82 (04/05 0130)  Labs: Recent Labs    08/05/17 2306 08/05/17 2314  WBC 4.4  --   HGB 12.6 12.2  PLT 84*  --   CREATININE 0.88 0.90    Estimated Creatinine Clearance: 62.6 mL/min (by C-G formula based on SCr of 0.9 mg/dL).  No results for input(s): VANCOTROUGH, VANCOPEAK, VANCORANDOM, GENTTROUGH, GENTPEAK, GENTRANDOM, TOBRATROUGH, TOBRAPEAK, TOBRARND, AMIKACINPEAK, AMIKACINTROU, AMIKACIN in the last 72 hours.   Microbiology: No results found for this or any previous visit (from the past 720 hour(s)).  Medical History: No past medical history on file.  Medications:  Aztreonam 2 Gm IX x 1 ED dose  Assessment: 67 yo female nursing home resident with hx of COPD seen in the ED for altered mental status and weakness. Pharmacy has been consulted for Vancomycin for HCAP.  Goal of Therapy:  Vancomycin troughs 15-20 mcg/ml  Plan:  Preliminary review of pertinent patient information completed.  Protocol will be initiated with a loading dose of Vancomycin 1500 mg IV.  [facility] clinical pharmacist will complete review during morning rounds to assess patient and finalize treatment regimen if needed.  Norberto Sorenson, [facility] 08/06/2017,2:46 AM

## 2017-08-07 DIAGNOSIS — G9341 Metabolic encephalopathy: Secondary | ICD-10-CM

## 2017-08-07 DIAGNOSIS — F319 Bipolar disorder, unspecified: Secondary | ICD-10-CM

## 2017-08-07 DIAGNOSIS — J189 Pneumonia, unspecified organism: Secondary | ICD-10-CM

## 2017-08-07 DIAGNOSIS — J9621 Acute and chronic respiratory failure with hypoxia: Secondary | ICD-10-CM

## 2017-08-07 DIAGNOSIS — G250 Essential tremor: Secondary | ICD-10-CM

## 2017-08-07 LAB — BASIC METABOLIC PANEL
Anion gap: 7 (ref 5–15)
BUN: 12 mg/dL (ref 6–20)
CALCIUM: 8 mg/dL — AB (ref 8.9–10.3)
CO2: 27 mmol/L (ref 22–32)
CREATININE: 0.7 mg/dL (ref 0.44–1.00)
Chloride: 107 mmol/L (ref 101–111)
GFR calc non Af Amer: >60 mL/min (ref >60)
Glucose, Bld: 98 mg/dL (ref 65–99)
Potassium: 4.3 mmol/L (ref 3.5–5.1)
SODIUM: 141 mmol/L (ref 135–145)

## 2017-08-07 LAB — CBC
HCT: 38.7 % (ref 36.0–46.0)
Hemoglobin: 12.4 g/dL (ref 12.0–15.0)
MCH: 30.8 pg (ref 26.0–34.0)
MCHC: 32 g/dL (ref 30.0–36.0)
MCV: 96 fL (ref 78.0–100.0)
PLATELETS: 83 10*3/uL — AB (ref 150–400)
RBC: 4.03 MIL/uL (ref 3.87–5.11)
RDW: 12.7 % (ref 11.5–15.5)
WBC: 3.9 10*3/uL — AB (ref 4.0–10.5)

## 2017-08-07 MED ORDER — LEVOFLOXACIN 500 MG PO TABS
500.0000 mg | ORAL_TABLET | Freq: Every day | ORAL | Status: DC
  Administered 2017-08-07 – 2017-08-08 (×2): 500 mg via ORAL
  Filled 2017-08-07 (×2): qty 1

## 2017-08-07 MED ORDER — AMOXICILLIN-POT CLAVULANATE 500-125 MG PO TABS: 1.0000 | ORAL_TABLET | Freq: Two times a day (BID) | ORAL | Status: DC

## 2017-08-07 MED ORDER — LEVOFLOXACIN 750 MG PO TABS: 750.0000 mg | ORAL_TABLET | Freq: Every day | ORAL | Status: DC

## 2017-08-07 MED ORDER — DIPHENHYDRAMINE HCL 25 MG PO CAPS
25.0000 mg | ORAL_CAPSULE | Freq: Once | ORAL | Status: AC
  Administered 2017-08-07: 25 mg via ORAL
  Filled 2017-08-07: qty 1

## 2017-08-07 NOTE — Plan of Care (Signed)
progressing 

## 2017-08-07 NOTE — Progress Notes (Signed)
PROGRESS NOTE    Laurie Moreno  WLN:[id number] DOB: [birth year] DOA: 08/05/2017 PCP: Blythe Stanford., MD     Brief Narrative:  67 year old woman admitted from her assisted living facility on 4/4 due to confusion and shortness of breath.  She was found to have acute hypoxemic respiratory failure due to pneumonia, did require BiPAP transiently.  Admission was requested.   Assessment & Plan:   Active Problems:   Altered mental status   Acute metabolic encephalopathy   HCAP (healthcare-associated pneumonia)   Bipolar 1 disorder (HCC)   Essential tremor   Acute on chronic respiratory failure with hypoxia (HCC)   Acute encephalopathy -Completely resolved. -Presumed due to hypoxemia with pneumonia.  Acute hypoxemic respiratory failure -Did require BiPAP transiently. -Is currently on 4 L of oxygen, will wean today. -She does not use oxygen at baseline.  Hospital-acquired pneumonia -Was initially placed on vancomycin and aztreonam. -Given negative MRSA PCR vancomycin was discontinued on 4/5. -Given continued symptomatic improvement will elect to discontinue IV antibiotics today and initiate oral treatment with Augmentin for plan of a total 7-10-day course.  Benign essential tremor -Continue primidone  Bipolar disorder -Mood stable, continue Depakote   DVT prophylaxis: Lovenox Code Status: Full code Family Communication: Discussed with sister and brother at bedside on 4/5 Disposition Plan: Transfer to medical floor, anticipate discharge in 24 hours  Consultants:   None  Procedures:   None  Antimicrobials:  Anti-infectives (From admission, onward)   Start     Dose/Rate Route Frequency Ordered Stop   08/06/17 1700  vancomycin (VANCOCIN) 1,250 mg in sodium chloride 0.9 % 250 mL IVPB  Status:  Discontinued     1,250 mg 166.7 mL/hr over 90 Minutes Intravenous Every 12 hours 08/06/17 0805 08/06/17 1029   08/06/17 1000  aztreonam (AZACTAM) 2 g in sodium chloride 0.9 %  100 mL IVPB     2 g 200 mL/hr over 30 Minutes Intravenous Every 8 hours 08/06/17 0805     08/06/17 0245  vancomycin (VANCOCIN) 1,500 mg in sodium chloride 0.9 % 500 mL IVPB     1,500 mg 250 mL/hr over 120 Minutes Intravenous  Once 08/06/17 0235 08/06/17 0523   08/06/17 0145  aztreonam (AZACTAM) 2 g in sodium chloride 0.9 % 100 mL IVPB     2 g 200 mL/hr over 30 Minutes Intravenous  Once 08/06/17 0135 08/06/17 0301       Subjective: Feels much improved, no acute issues.  Did not use BiPAP overnight.  Denies chest pain or shortness of breath.  Objective: Vitals:   08/07/17 0500 08/07/17 0600 08/07/17 0725 08/07/17 0756  BP:  (!) 124/93  123/74  Pulse: 65 73  61  Resp: 20 (!) 22    Temp:   98.5 F (36.9 C)   TempSrc:   Oral   SpO2: 96% 95%    Weight:      Height:        Intake/Output Summary (Last 24 hours) at 08/07/2017 1043 Last data filed at 08/07/2017 0600 Gross per 24 hour  Intake 2860 ml  Output 1700 ml  Net 1160 ml   Filed Weights   08/05/17 2306 08/06/17 0500 08/07/17 0400  Weight: 86.2 kg (190 lb) 91 kg (200 lb 9.9 oz) 92.7 kg (204 lb 5.9 oz)    Examination:  30 minutesGeneral exam: Alert, awake, oriented x 3 Respiratory system: Clear to auscultation. Respiratory effort normal. Cardiovascular system:RRR. No murmurs, rubs, gallops. Gastrointestinal system: Abdomen is nondistended, soft and nontender. No  organomegaly or masses felt. Normal bowel sounds heard. Central nervous system: Alert and oriented. No focal neurological deficits. Extremities: No C/C/E, +pedal pulses Skin: No rashes, lesions or ulcers Psychiatry: Judgement and insight appear normal. Mood & affect appropriate.     Data Reviewed: I have personally reviewed following labs and imaging studies  CBC: Recent Labs  Lab 08/05/17 2306 08/05/17 2314 08/06/17 0400 08/07/17 0430  WBC 4.4  --  3.2* 3.9*  NEUTROABS 0.9*  --   --   --   HGB 12.6 12.2 12.3 12.4  HCT 39.5 36.0 38.6 38.7  MCV 96.8   --  98.7 96.0  PLT 84*  --  78* 83*   Basic Metabolic Panel: Recent Labs  Lab 08/05/17 2306 08/05/17 2314 08/06/17 0400 08/07/17 0430  NA 140 142 145 141  K 3.9 4.0 4.2 4.3  CL 105 104 109 107  CO2 25  --  25 27  GLUCOSE 133* 128* 127* 98  BUN 19 18 19 12   CREATININE 0.88 0.90 0.77 0.70  CALCIUM 8.1*  --  7.6* 8.0*   GFR: Estimated Creatinine Clearance: 73.3 mL/min (by C-G formula based on SCr of 0.7 mg/dL). Liver Function Tests: Recent Labs  Lab 08/05/17 2306 08/06/17 0400  AST 16 14*  ALT 9* 9*  ALKPHOS 43 42  BILITOT 0.9 0.6  PROT 5.9* 5.8*  ALBUMIN 3.5 3.3*   No results for input(s): LIPASE, AMYLASE in the last 168 hours. Recent Labs  Lab 08/05/17 2352  AMMONIA 40*   Coagulation Profile: Recent Labs  Lab 08/05/17 2306  INR 1.14   Cardiac Enzymes: No results for input(s): CKTOTAL, CKMB, CKMBINDEX, TROPONINI in the last 168 hours. BNP (last 3 results) No results for input(s): PROBNP in the last 8760 hours. HbA1C: No results for input(s): HGBA1C in the last 72 hours. CBG: No results for input(s): GLUCAP in the last 168 hours. Lipid Profile: No results for input(s): CHOL, HDL, LDLCALC, TRIG, CHOLHDL, LDLDIRECT in the last 72 hours. Thyroid Function Tests: No results for input(s): TSH, T4TOTAL, FREET4, T3FREE, THYROIDAB in the last 72 hours. Anemia Panel: No results for input(s): VITAMINB12, FOLATE, FERRITIN, TIBC, IRON, RETICCTPCT in the last 72 hours. Urine analysis:    Component Value Date/Time   COLORURINE YELLOW 08/06/2017 0255   APPEARANCEUR CLEAR 08/06/2017 0255   LABSPEC 1.009 08/06/2017 0255   PHURINE 5.0 08/06/2017 0255   GLUCOSEU NEGATIVE 08/06/2017 0255   HGBUR SMALL (A) 08/06/2017 0255   BILIRUBINUR NEGATIVE 08/06/2017 0255   KETONESUR NEGATIVE 08/06/2017 0255   PROTEINUR NEGATIVE 08/06/2017 0255   NITRITE NEGATIVE 08/06/2017 0255   LEUKOCYTESUR NEGATIVE 08/06/2017 0255   Sepsis  Labs: @LABRCNTIP (procalcitonin:4,lacticidven:4)  ) Recent Results (from the past 240 hour(s))  Culture, blood (Routine X 2) w Reflex to ID Panel     Status: None (Preliminary result)   Collection Time: 08/06/17  3:57 AM  Result Value Ref Range Status   Specimen Description BLOOD RIGHT HAND  Final   Special Requests   Final    BOTTLES DRAWN AEROBIC AND ANAEROBIC Blood Culture adequate volume   Culture   Final    NO GROWTH < 12 HOURS Performed at [facility], [address] [place], [place] [address]    Report Status PENDING  Incomplete  Culture, blood (Routine X 2) w Reflex to ID Panel     Status: None (Preliminary result)   Collection Time: 08/06/17  3:59 AM  Result Value Ref Range Status   Specimen Description BLOOD LEFT  HAND  Final   Special Requests   Final    BOTTLES DRAWN AEROBIC AND ANAEROBIC Blood Culture adequate volume   Culture   Final    NO GROWTH < 12 HOURS Performed at [facility], [address] [place], [place] [address]    Report Status PENDING  Incomplete  MRSA PCR Screening     Status: None   Collection Time: 08/06/17  4:10 AM  Result Value Ref Range Status   MRSA by PCR NEGATIVE NEGATIVE Final    Comment:        The GeneXpert MRSA Assay (FDA approved for NASAL specimens only), is one component of a comprehensive MRSA colonization surveillance program. It is not intended to diagnose MRSA infection nor to guide or monitor treatment for MRSA infections. Performed at [facility], [address] [place],  [address]          Radiology Studies: Dg Chest 1 View  Result Date: 08/05/2017 CLINICAL DATA:  Confusion EXAM: CHEST  1 VIEW COMPARISON:  Report 04/04/2015 FINDINGS: Scoliosis of the spine. Patchy atelectasis or minimal infiltrate at the left base. No large effusion. Mild cardiomegaly. No pneumothorax. IMPRESSION: Patchy atelectasis or minimal infiltrate at the left base. Mild cardiomegaly. Electronically Signed   By: Donavan Foil M.D.    On: 08/05/2017 23:49   Ct Head Wo Contrast  Result Date: 08/05/2017 CLINICAL DATA:  Altered LOC EXAM: CT HEAD WITHOUT CONTRAST TECHNIQUE: Contiguous axial images were obtained from the base of the skull through the vertex without intravenous contrast. COMPARISON:  None. FINDINGS: Brain: No acute territorial infarction, hemorrhage or intracranial mass is visualized. Mild atrophy. Normal ventricle size. Vascular: No hyperdense vessel or unexpected calcification. Skull: Normal. Negative for fracture or focal lesion. Sinuses/Orbits: Mucosal thickening in the sphenoid sinus. No acute orbital abnormality Other: None IMPRESSION: No CT evidence for acute intracranial abnormality.  Mild atrophy Electronically Signed   By: Donavan Foil M.D.   On: 08/05/2017 23:49   Mr Brain Wo Contrast  Result Date: 08/06/2017 CLINICAL DATA:  Altered level of consciousness, unexplained. EXAM: MRI HEAD WITHOUT CONTRAST TECHNIQUE: Multiplanar, multiecho pulse sequences of the brain and surrounding structures were obtained without intravenous contrast. COMPARISON:  Head CT from yesterday FINDINGS: Brain: No acute infarction, hemorrhage, hydrocephalus, extra-axial collection or mass lesion. Mild periventricular FLAIR hyperintensity best attributed to chronic small vessel ischemia. Age normal brain volume. Vascular: Diffusely preserved flow voids. Tortuous intracranial vessels, no chart history of hypertension. Skull and upper cervical spine: No evidence of marrow lesion. Sinuses/Orbits: Bilateral cataract resection. Chronic right sphenoid sinusitis with sclerotic wall thickening by preceding CT. IMPRESSION: No acute finding or explanation for symptoms. Electronically Signed   By: Monte Fantasia M.D.   On: 08/06/2017 11:07        Scheduled Meds: . aspirin  81 mg Oral Daily  . atorvastatin  40 mg Oral Daily  . carvedilol  3.125 mg Oral BID WC  . divalproex  250 mg Oral TID  . enoxaparin (LOVENOX) injection  40 mg Subcutaneous  Q24H  . escitalopram  10 mg Oral Daily  . levothyroxine  100 mcg Oral QAC breakfast  . loratadine  10 mg Oral Daily  . multivitamin with minerals  1 tablet Oral Daily  . primidone  50 mg Oral Q2000   Continuous Infusions: . sodium chloride 100 mL/hr at 08/07/17 0300  . aztreonam 2 g (08/07/17 0509)     LOS: 1 day    Time spent: 30 minutes. Greater than 50%  of this time was spent in direct contact with the patient coordinating care.     Lelon Frohlich, MD [facility] Pager [phone]  If 7PM-7AM, please contact night-coverage [email or web address] Password [facility] 08/07/2017, 10:43 AM

## 2017-08-08 DIAGNOSIS — F319 Bipolar disorder, unspecified: Secondary | ICD-10-CM

## 2017-08-08 MED ORDER — TRAMADOL HCL 50 MG PO TABS
50.0000 mg | ORAL_TABLET | Freq: Once | ORAL | Status: AC
  Administered 2017-08-08: 50 mg via ORAL
  Filled 2017-08-08: qty 1

## 2017-08-08 MED ORDER — LEVOFLOXACIN 500 MG PO TABS: 500.0000 mg | ORAL_TABLET | Freq: Every day | ORAL | 0 refills | Status: DC

## 2017-08-08 NOTE — Progress Notes (Signed)
Patient will Discharge To: [facility] Anticipated DC Date:08/08/17 Family Notified:yes, Fabio Bering [phone] Transport By: Regis Bill   Per MD patient ready for DC to [facility], patient, patient's family, and facility notified of DC. Assessment, Fl2/Pasrr, and Discharge Summary sent to facility. RN given number for report [phone]). DC packet on chart.    CSW signing off.  Reed Breech LCSWA [phone]

## 2017-08-08 NOTE — NC FL2 (Signed)
[place] LEVEL OF CARE SCREENING TOOL     IDENTIFICATION  Patient Name: Laurie Moreno Birthdate: 1950/06/15 Sex: female Admission Date (Current Location): 08/05/2017  [place] and [place] Number:  [place] [facility] [id number] [place] and Address:  [facility] [address] [place]      Provider Number: [id number]  Attending Physician Name and Address:  Isaac Bliss, [place] Name and Phone Number:  Fabio Bering (sister) [phone]    Current Level of Care: Hospital Recommended Level of Care: [place] Prior Approval Number:    Date Approved/Denied:   PASRR Number:    Discharge Plan: Other (Comment)    Current Diagnoses: Patient Active Problem List   Diagnosis Date Noted  . Acute metabolic encephalopathy 31/49/7026  . HCAP (healthcare-associated pneumonia) 08/07/2017  . Bipolar 1 disorder ([place]) 08/07/2017  . Essential tremor 08/07/2017  . Acute on chronic respiratory failure with hypoxia ([place]) 08/07/2017  . Altered mental status 08/06/2017    Orientation RESPIRATION BLADDER Height & Weight     Self, Time, Situation, Place  Normal Continent Weight: 204 lb 5.9 oz (92.7 kg) Height:  5\' 2"  (157.5 cm)  BEHAVIORAL SYMPTOMS/MOOD NEUROLOGICAL BOWEL NUTRITION STATUS      Continent Diet(cut up meats, no added salt)  AMBULATORY STATUS COMMUNICATION OF NEEDS Skin   Independent(with walker) Verbally Normal                       Personal Care Assistance Level of Assistance  Bathing, Feeding, Dressing Bathing Assistance: Limited assistance Feeding assistance: Independent Dressing Assistance: Limited assistance     Functional Limitations Info  Sight, Hearing, Speech Sight Info: Adequate Hearing Info: Adequate Speech Info: Adequate    SPECIAL CARE FACTORS FREQUENCY                       Contractures Contractures Info: Not present    Additional Factors Info  Code Status, Allergies,  Psychotropic Code Status Info: full Allergies Info: codeine, penicillins Psychotropic Info: Lexapro, Depakote         Current Medications (08/08/2017):  This is the current hospital active medication list TAKE these medications   aspirin 81 MG chewable tablet Chew 81 mg by mouth daily.   atorvastatin 40 MG tablet Commonly known as:  LIPITOR Take 40 mg by mouth daily.   carvedilol 3.125 MG tablet Commonly known as:  COREG Take 3.125 mg by mouth 2 (two) times daily with a meal.   clonazePAM 0.5 MG tablet Commonly known as:  KLONOPIN Take 0.5 mg by mouth 2 (two) times daily.   divalproex 250 MG DR tablet Commonly known as:  DEPAKOTE Take 250 mg by mouth 3 (three) times daily.   escitalopram 10 MG tablet Commonly known as:  LEXAPRO Take 10 mg by mouth daily.   gabapentin 300 MG capsule Commonly known as:  NEURONTIN Take 300 mg by mouth 4 (four) times daily.   hydrOXYzine 25 MG tablet Commonly known as:  ATARAX/VISTARIL Take 25 mg by mouth every 8 (eight) hours as needed.   ipratropium-albuterol 0.5-2.5 (3) MG/3ML Soln Commonly known as:  DUONEB Take 3 mLs by nebulization every 6 (six) hours as needed.   levofloxacin 500 MG tablet Commonly known as:  LEVAQUIN Take 1 tablet (500 mg total) by mouth daily. Start taking on:  08/09/2017   levothyroxine 100 MCG tablet Commonly known as:  SYNTHROID, LEVOTHROID Take 100 mcg by mouth daily before breakfast.  loratadine 10 MG tablet Commonly known as:  CLARITIN Take 10 mg by mouth daily.   multivitamin tablet Take 1 tablet by mouth daily.   primidone 50 MG tablet Commonly known as:  MYSOLINE Take 50 mg by mouth daily at 8 pm.   zinc oxide 20 % ointment Apply 1 application topically as needed for irritation.          Discharge Medications: Please see discharge summary for a list of discharge medications.  Relevant Imaging Results:  Relevant Lab Results:   Additional [place] Laddie Math, LCSWA

## 2017-08-08 NOTE — Discharge Instructions (Signed)

## 2017-08-08 NOTE — Progress Notes (Signed)
Received a call from Reed Breech, social worker who is covering [facility] this weekend.  She states that [facility] has faxed back a document with requests regarding medication changes.  There were multiple requests.  I thought it would be easier to contact the admission director at the facility directly and Baxter Flattery has provided me with her phone number.  It seems to me that the facility is having unreasonable requests and is not assisting [place] in discharging this patient back.  She tells me that any changes to medications that I am making on discharge that differ from what she was taking prior to admission to the hospital, need a "piece of paper" stating that these medications are being discontinued.  She even tells me that she needs a prescription to discontinue medications (I ask her how I am supposed to write a prescription to discontinue medications to which she has no answer).  I have discussed with her that medications that are listed on my discharge summary should suffice as orders for medications, the only new medication that she is on is Levaquin for treatment of her pneumonia and she has already gotten today's dose and should not need further antibiotics until tomorrow.  After a few minutes of back and forth in regards to me explaining to her that my orders for medications are what is listed on my discharge summary and she shouldn't need additional documentation as to what medications she is being sent with, she states that she never received a discharge summary and is only going off the FL 2.  I have called Baxter Flattery back again who states that she has faxed proof that the discharge summary was sent.  Nonetheless, she will resend the discharge summary and contact the facility again.  Patient remains stable for discharge.  Domingo Mend, MD [facility] Pager: [phone]

## 2017-08-08 NOTE — Discharge Summary (Addendum)
Physician Discharge Summary  Laurie Moreno EYC:[id number] DOB: [birth year] DOA: 08/05/2017  PCP: Blythe Stanford., MD  Admit date: 08/05/2017 Discharge date: 08/08/2017  Time spent: 45 minutes  Recommendations for Outpatient Follow-up:  -To be discharged back to ALF today. -To continue Levaquin once daily for 4 additional days.  Dose for 4/7 has already been given.  Discharge Diagnoses:  Active Problems:   Altered mental status   Acute metabolic encephalopathy   HCAP (healthcare-associated pneumonia)   Bipolar 1 disorder ([place])   Essential tremor   Acute on chronic respiratory failure with hypoxia [facility])   Discharge Condition: Stable and improved  Filed Weights   08/05/17 2306 08/06/17 0500 08/07/17 0400  Weight: 86.2 kg (190 lb) 91 kg (200 lb 9.9 oz) 92.7 kg (204 lb 5.9 oz)    History of present illness:  As per Dr. Darrick Meigs on 4/5: Laurie Moreno  is a 67 y.o. female, with history of COPD, tremors presents with altered mental status.   Per nursing facility patient was found to be confused around 10 PM.  Earlier patient's sister checked her in the skilled facility around 1 PM when she was fine.  Patient was found to be in acute hypoxic and hypercapnic respiratory failure and put on BiPAP.  She has improved but unable to provide any significant history. Chest x-ray showed possible infiltrate and patient started on vancomycin and Azactam.    Hospital Course:   Acute encephalopathy -Completely resolved. -Presumed due to hypoxemia with pneumonia.  Acute hypoxemic respiratory failure -Did require BiPAP transiently. -Has been completely weaned off oxygen at time of discharge. -She does not use oxygen at baseline.  Hospital-acquired pneumonia -Was initially placed on vancomycin and aztreonam. -Given negative MRSA PCR vancomycin was discontinued on 4/5. -Antibiotics have been further narrowed to Levaquin on 4/6. -We will need to continue antibiotics for an additional 4 days on  discharge. -Of note Levaquin dose for 4/7 has been administered prior to discharge. -All culture data remains negative to date.  Benign essential tremor -Continue primidone. Apparently at ALF she was taking 50 mg 1.5 tablets qhs. This has been changed in the hospital to 1 tablet qhs.  Bipolar disorder -Mood stable, continue Depakote    Procedures:  None   Consultations:  None  Discharge Instructions  Discharge Instructions    Diet - low sodium heart healthy   Complete by:  As directed    Increase activity slowly   Complete by:  As directed      Allergies as of 08/08/2017      Reactions   Codeine Nausea And Vomiting   Other reaction(s): GI Upset (intolerance)   Penicillins Hives, Rash   Not effective per patient Pt does not know. She states it was when she was a child. unsure      Medication List    TAKE these medications   aspirin 81 MG chewable tablet Chew 81 mg by mouth daily.   atorvastatin 40 MG tablet Commonly known as:  LIPITOR Take 40 mg by mouth daily.   carvedilol 3.125 MG tablet Commonly known as:  COREG Take 3.125 mg by mouth 2 (two) times daily with a meal.   clonazePAM 0.5 MG tablet Commonly known as:  KLONOPIN Take 0.5 mg by mouth 2 (two) times daily.   divalproex 250 MG DR tablet Commonly known as:  DEPAKOTE Take 250 mg by mouth 3 (three) times daily.   escitalopram 10 MG tablet Commonly known as:  LEXAPRO Take 10 mg  by mouth daily.   gabapentin 300 MG capsule Commonly known as:  NEURONTIN Take 300 mg by mouth 4 (four) times daily.   hydrOXYzine 25 MG tablet Commonly known as:  ATARAX/VISTARIL Take 25 mg by mouth every 8 (eight) hours as needed.   ipratropium-albuterol 0.5-2.5 (3) MG/3ML Soln Commonly known as:  DUONEB Take 3 mLs by nebulization every 6 (six) hours as needed.   levofloxacin 500 MG tablet Commonly known as:  LEVAQUIN Take 1 tablet (500 mg total) by mouth daily. Start taking on:  08/09/2017   levothyroxine  100 MCG tablet Commonly known as:  SYNTHROID, LEVOTHROID Take 100 mcg by mouth daily before breakfast.   loratadine 10 MG tablet Commonly known as:  CLARITIN Take 10 mg by mouth daily.   multivitamin tablet Take 1 tablet by mouth daily.   primidone 50 MG tablet Commonly known as:  MYSOLINE Take 50 mg by mouth daily at 8 pm.   zinc oxide 20 % ointment Apply 1 application topically as needed for irritation.      Allergies  Allergen Reactions  . Codeine Nausea And Vomiting    Other reaction(s): GI Upset (intolerance)  . Penicillins Hives and Rash    Not effective per patient Pt does not know. She states it was when she was a child. unsure       The results of significant diagnostics from this hospitalization (including imaging, microbiology, ancillary and laboratory) are listed below for reference.    Significant Diagnostic Studies: Dg Chest 1 View  Result Date: 08/05/2017 CLINICAL DATA:  Confusion EXAM: CHEST  1 VIEW COMPARISON:  Report 04/04/2015 FINDINGS: Scoliosis of the spine. Patchy atelectasis or minimal infiltrate at the left base. No large effusion. Mild cardiomegaly. No pneumothorax. IMPRESSION: Patchy atelectasis or minimal infiltrate at the left base. Mild cardiomegaly. Electronically Signed   By: Donavan Foil M.D.   On: 08/05/2017 23:49   Ct Head Wo Contrast  Result Date: 08/05/2017 CLINICAL DATA:  Altered LOC EXAM: CT HEAD WITHOUT CONTRAST TECHNIQUE: Contiguous axial images were obtained from the base of the skull through the vertex without intravenous contrast. COMPARISON:  None. FINDINGS: Brain: No acute territorial infarction, hemorrhage or intracranial mass is visualized. Mild atrophy. Normal ventricle size. Vascular: No hyperdense vessel or unexpected calcification. Skull: Normal. Negative for fracture or focal lesion. Sinuses/Orbits: Mucosal thickening in the sphenoid sinus. No acute orbital abnormality Other: None IMPRESSION: No CT evidence for acute  intracranial abnormality.  Mild atrophy Electronically Signed   By: Donavan Foil M.D.   On: 08/05/2017 23:49   Mr Brain Wo Contrast  Result Date: 08/06/2017 CLINICAL DATA:  Altered level of consciousness, unexplained. EXAM: MRI HEAD WITHOUT CONTRAST TECHNIQUE: Multiplanar, multiecho pulse sequences of the brain and surrounding structures were obtained without intravenous contrast. COMPARISON:  Head CT from yesterday FINDINGS: Brain: No acute infarction, hemorrhage, hydrocephalus, extra-axial collection or mass lesion. Mild periventricular FLAIR hyperintensity best attributed to chronic small vessel ischemia. Age normal brain volume. Vascular: Diffusely preserved flow voids. Tortuous intracranial vessels, no chart history of hypertension. Skull and upper cervical spine: No evidence of marrow lesion. Sinuses/Orbits: Bilateral cataract resection. Chronic right sphenoid sinusitis with sclerotic wall thickening by preceding CT. IMPRESSION: No acute finding or explanation for symptoms. Electronically Signed   By: Monte Fantasia M.D.   On: 08/06/2017 11:07    Microbiology: Recent Results (from the past 240 hour(s))  Culture, blood (Routine X 2) w Reflex to ID Panel     Status: None (Preliminary result)  Collection Time: 08/06/17  3:57 AM  Result Value Ref Range Status   Specimen Description BLOOD RIGHT HAND  Final   Special Requests   Final    BOTTLES DRAWN AEROBIC AND ANAEROBIC Blood Culture adequate volume   Culture   Final    NO GROWTH 2 DAYS Performed at [facility], [address], [place], [place] [address]    Report Status PENDING  Incomplete  Culture, blood (Routine X 2) w Reflex to ID Panel     Status: None (Preliminary result)   Collection Time: 08/06/17  3:59 AM  Result Value Ref Range Status   Specimen Description BLOOD LEFT HAND  Final   Special Requests   Final    BOTTLES DRAWN AEROBIC AND ANAEROBIC Blood Culture adequate volume   Culture   Final    NO GROWTH 2 DAYS Performed at  [facility], [address] [place], [place] [address]    Report Status PENDING  Incomplete  MRSA PCR Screening     Status: None   Collection Time: 08/06/17  4:10 AM  Result Value Ref Range Status   MRSA by PCR NEGATIVE NEGATIVE Final    Comment:        The GeneXpert MRSA Assay (FDA approved for NASAL specimens only), is one component of a comprehensive MRSA colonization surveillance program. It is not intended to diagnose MRSA infection nor to guide or monitor treatment for MRSA infections. Performed at [facility] [address], [place], [place] [address]      Labs: Basic Metabolic Panel: Recent Labs  Lab 08/05/17 2306 08/05/17 2314 08/06/17 0400 08/07/17 0430  NA 140 142 145 141  K 3.9 4.0 4.2 4.3  CL 105 104 109 107  CO2 25  --  25 27  GLUCOSE 133* 128* 127* 98  BUN 19 18 19 12   CREATININE 0.88 0.90 0.77 0.70  CALCIUM 8.1*  --  7.6* 8.0*   Liver Function Tests: Recent Labs  Lab 08/05/17 2306 08/06/17 0400  AST 16 14*  ALT 9* 9*  ALKPHOS 43 42  BILITOT 0.9 0.6  PROT 5.9* 5.8*  ALBUMIN 3.5 3.3*   No results for input(s): LIPASE, AMYLASE in the last 168 hours. Recent Labs  Lab 08/05/17 2352  AMMONIA 40*   CBC: Recent Labs  Lab 08/05/17 2306 08/05/17 2314 08/06/17 0400 08/07/17 0430  WBC 4.4  --  3.2* 3.9*  NEUTROABS 0.9*  --   --   --   HGB 12.6 12.2 12.3 12.4  HCT 39.5 36.0 38.6 38.7  MCV 96.8  --  98.7 96.0  PLT 84*  --  78* 83*   Cardiac Enzymes: No results for input(s): CKTOTAL, CKMB, CKMBINDEX, TROPONINI in the last 168 hours. BNP: BNP (last 3 results) No results for input(s): BNP in the last 8760 hours.  ProBNP (last 3 results) No results for input(s): PROBNP in the last 8760 hours.  CBG: No results for input(s): GLUCAP in the last 168 hours.     Signed:  Lelon Frohlich  [facility] Pager: [phone] 08/08/2017, 12:23 PM

## 2017-08-08 NOTE — Progress Notes (Signed)
CSW contacted Maudie Mercury at [facility] to let her know FL2 and discharge summary had been faxed to her at 1:55pm. CSW spoke with Maudie Mercury 30 minutes later.  Kim had not had a chance to review FL2 and Discharge summary.  Kim contacted CSW to stated some of patient's current medication was not on the FL2.  Kim faxed a list of medication that she will need to be placed on placed or changed on FL2.  CSW spoke with Dr. Jerilee Hoh concerning the request Maudie Mercury sent.  Dr. Jerilee Hoh contacted Maudie Mercury at the facility. CSW contacted AD Zack concerning facility not taking patient back.  AD Zack spoke with Alyse Low at facility. Dr. Jerilee Hoh added in discharge summary that the primidone was changed in hospital.  Reed Breech [facility] [phone]

## 2017-08-11 ENCOUNTER — Other Ambulatory Visit (HOSPITAL_COMMUNITY): Payer: Self-pay | Admitting: Family Medicine

## 2017-08-11 DIAGNOSIS — Z1231 Encounter for screening mammogram for malignant neoplasm of breast: Secondary | ICD-10-CM

## 2017-08-11 LAB — CULTURE, BLOOD (ROUTINE X 2)
CULTURE: NO GROWTH
Culture: NO GROWTH
Special Requests: ADEQUATE
Special Requests: ADEQUATE

## 2017-08-21 ENCOUNTER — Emergency Department (HOSPITAL_COMMUNITY): Payer: Medicare Other

## 2017-08-21 ENCOUNTER — Encounter (HOSPITAL_COMMUNITY): Payer: Self-pay | Admitting: Emergency Medicine

## 2017-08-21 ENCOUNTER — Other Ambulatory Visit: Payer: Self-pay

## 2017-08-21 ENCOUNTER — Inpatient Hospital Stay (HOSPITAL_COMMUNITY)
Admission: EM | Admit: 2017-08-21 | Discharge: 2017-08-25 | DRG: 690 | Disposition: A | Discharge status: Skilled Nursing Facility | Payer: Medicare Other | Attending: Internal Medicine | Admitting: Internal Medicine

## 2017-08-21 DIAGNOSIS — N12 Tubulo-interstitial nephritis, not specified as acute or chronic: Secondary | ICD-10-CM | POA: Diagnosis present

## 2017-08-21 DIAGNOSIS — D696 Thrombocytopenia, unspecified: Secondary | ICD-10-CM | POA: Diagnosis present

## 2017-08-21 DIAGNOSIS — N135 Crossing vessel and stricture of ureter without hydronephrosis: Secondary | ICD-10-CM | POA: Insufficient documentation

## 2017-08-21 DIAGNOSIS — J449 Chronic obstructive pulmonary disease, unspecified: Secondary | ICD-10-CM | POA: Diagnosis present

## 2017-08-21 DIAGNOSIS — Z79899 Other long term (current) drug therapy: Secondary | ICD-10-CM

## 2017-08-21 DIAGNOSIS — B9629 Other Escherichia coli [E. coli] as the cause of diseases classified elsewhere: Secondary | ICD-10-CM | POA: Diagnosis not present

## 2017-08-21 DIAGNOSIS — R509 Fever, unspecified: Secondary | ICD-10-CM

## 2017-08-21 DIAGNOSIS — E785 Hyperlipidemia, unspecified: Secondary | ICD-10-CM | POA: Diagnosis present

## 2017-08-21 DIAGNOSIS — Z880 Allergy status to penicillin: Secondary | ICD-10-CM

## 2017-08-21 DIAGNOSIS — Z452 Encounter for adjustment and management of vascular access device: Secondary | ICD-10-CM

## 2017-08-21 DIAGNOSIS — N390 Urinary tract infection, site not specified: Secondary | ICD-10-CM | POA: Diagnosis not present

## 2017-08-21 DIAGNOSIS — R739 Hyperglycemia, unspecified: Secondary | ICD-10-CM | POA: Diagnosis present

## 2017-08-21 DIAGNOSIS — Z1612 Extended spectrum beta lactamase (ESBL) resistance: Secondary | ICD-10-CM | POA: Diagnosis present

## 2017-08-21 DIAGNOSIS — F17200 Nicotine dependence, unspecified, uncomplicated: Secondary | ICD-10-CM | POA: Diagnosis present

## 2017-08-21 DIAGNOSIS — B9620 Unspecified Escherichia coli [E. coli] as the cause of diseases classified elsewhere: Secondary | ICD-10-CM | POA: Diagnosis present

## 2017-08-21 DIAGNOSIS — F319 Bipolar disorder, unspecified: Secondary | ICD-10-CM | POA: Diagnosis present

## 2017-08-21 DIAGNOSIS — Z885 Allergy status to narcotic agent status: Secondary | ICD-10-CM

## 2017-08-21 DIAGNOSIS — R197 Diarrhea, unspecified: Secondary | ICD-10-CM

## 2017-08-21 DIAGNOSIS — Z7982 Long term (current) use of aspirin: Secondary | ICD-10-CM | POA: Diagnosis not present

## 2017-08-21 DIAGNOSIS — N136 Pyonephrosis: Principal | ICD-10-CM | POA: Diagnosis present

## 2017-08-21 DIAGNOSIS — K7460 Unspecified cirrhosis of liver: Secondary | ICD-10-CM | POA: Diagnosis present

## 2017-08-21 DIAGNOSIS — R112 Nausea with vomiting, unspecified: Secondary | ICD-10-CM

## 2017-08-21 LAB — COMPREHENSIVE METABOLIC PANEL
ALT: 11 U/L — AB (ref 14–54)
ANION GAP: 13 (ref 5–15)
AST: 17 U/L (ref 15–41)
Albumin: 3.9 g/dL (ref 3.5–5.0)
Alkaline Phosphatase: 45 U/L (ref 38–126)
BUN: 16 mg/dL (ref 6–20)
CHLORIDE: 103 mmol/L (ref 101–111)
CO2: 25 mmol/L (ref 22–32)
CREATININE: 0.78 mg/dL (ref 0.44–1.00)
Calcium: 9 mg/dL (ref 8.9–10.3)
GFR calc non Af Amer: >60 mL/min (ref >60)
GLUCOSE: 182 mg/dL — AB (ref 65–99)
Potassium: 4.4 mmol/L (ref 3.5–5.1)
SODIUM: 141 mmol/L (ref 135–145)
Total Bilirubin: 2.1 mg/dL — ABNORMAL HIGH (ref 0.3–1.2)
Total Protein: 7.1 g/dL (ref 6.5–8.1)

## 2017-08-21 LAB — CBC WITH DIFFERENTIAL/PLATELET
BASOS PCT: 0 %
Basophils Absolute: 0 10*3/uL (ref 0.0–0.1)
Eosinophils Absolute: 0 10*3/uL (ref 0.0–0.7)
Eosinophils Relative: 0 %
HCT: 42 % (ref 36.0–46.0)
Hemoglobin: 13.7 g/dL (ref 12.0–15.0)
Lymphocytes Relative: 18 %
Lymphs Abs: 1.5 10*3/uL (ref 0.7–4.0)
MCH: 31 pg (ref 26.0–34.0)
MCHC: 32.6 g/dL (ref 30.0–36.0)
MCV: 95 fL (ref 78.0–100.0)
MONOS PCT: 21 %
Monocytes Absolute: 1.8 10*3/uL — ABNORMAL HIGH (ref 0.1–1.0)
NEUTROS PCT: 61 %
Neutro Abs: 5.2 10*3/uL (ref 1.7–7.7)
Platelets: 92 10*3/uL — ABNORMAL LOW (ref 150–400)
RBC: 4.42 MIL/uL (ref 3.87–5.11)
RDW: 13.1 % (ref 11.5–15.5)
WBC: 8.6 10*3/uL (ref 4.0–10.5)

## 2017-08-21 LAB — URINALYSIS, ROUTINE W REFLEX MICROSCOPIC
BILIRUBIN URINE: NEGATIVE
Glucose, UA: NEGATIVE mg/dL
Ketones, ur: 5 mg/dL — AB
NITRITE: NEGATIVE
PROTEIN: 100 mg/dL — AB
Specific Gravity, Urine: 1.013 (ref 1.005–1.030)
pH: 6 (ref 5.0–8.0)

## 2017-08-21 LAB — LACTIC ACID, PLASMA
LACTIC ACID, VENOUS: 1.9 mmol/L (ref 0.5–1.9)
Lactic Acid, Venous: 1.5 mmol/L (ref 0.5–1.9)

## 2017-08-21 LAB — TROPONIN I

## 2017-08-21 LAB — LIPASE, BLOOD: LIPASE: 48 U/L (ref 11–51)

## 2017-08-21 MED ORDER — SODIUM CHLORIDE 0.9 % IV SOLN
INTRAVENOUS | Status: DC
  Administered 2017-08-21: 17:00:00 via INTRAVENOUS

## 2017-08-21 MED ORDER — ESCITALOPRAM OXALATE 10 MG PO TABS
10.0000 mg | ORAL_TABLET | Freq: Every day | ORAL | Status: DC
  Administered 2017-08-22 – 2017-08-25 (×4): 10 mg via ORAL
  Filled 2017-08-21 (×4): qty 1

## 2017-08-21 MED ORDER — HYDROXYZINE HCL 25 MG PO TABS: 25.0000 mg | ORAL_TABLET | Freq: Four times a day (QID) | ORAL | Status: DC | PRN

## 2017-08-21 MED ORDER — GABAPENTIN 300 MG PO CAPS
300.0000 mg | ORAL_CAPSULE | Freq: Four times a day (QID) | ORAL | Status: DC
  Administered 2017-08-22 – 2017-08-25 (×14): 300 mg via ORAL
  Filled 2017-08-21 (×14): qty 1

## 2017-08-21 MED ORDER — ACETAMINOPHEN 500 MG PO TABS
1000.0000 mg | ORAL_TABLET | Freq: Once | ORAL | Status: AC
  Administered 2017-08-21: 1000 mg via ORAL
  Filled 2017-08-21: qty 2

## 2017-08-21 MED ORDER — SODIUM CHLORIDE 0.9 % IV SOLN
1.0000 g | Freq: Once | INTRAVENOUS | Status: AC
  Administered 2017-08-22: 1 g via INTRAVENOUS
  Filled 2017-08-21: qty 1

## 2017-08-21 MED ORDER — CARVEDILOL 3.125 MG PO TABS
3.1250 mg | ORAL_TABLET | Freq: Two times a day (BID) | ORAL | Status: DC
  Administered 2017-08-22 – 2017-08-25 (×7): 3.125 mg via ORAL
  Filled 2017-08-21 (×7): qty 1

## 2017-08-21 MED ORDER — ONDANSETRON HCL 4 MG/2ML IJ SOLN: 4.0000 mg | Freq: Four times a day (QID) | INTRAMUSCULAR | Status: DC | PRN

## 2017-08-21 MED ORDER — LEVOTHYROXINE SODIUM 50 MCG PO TABS
100.0000 ug | ORAL_TABLET | Freq: Every day | ORAL | Status: DC
  Administered 2017-08-22 – 2017-08-25 (×4): 100 ug via ORAL
  Filled 2017-08-21 (×4): qty 2

## 2017-08-21 MED ORDER — SENNOSIDES-DOCUSATE SODIUM 8.6-50 MG PO TABS: 1.0000 | ORAL_TABLET | Freq: Every evening | ORAL | Status: DC | PRN

## 2017-08-21 MED ORDER — IPRATROPIUM-ALBUTEROL 0.5-2.5 (3) MG/3ML IN SOLN: 3.0000 mL | Freq: Four times a day (QID) | RESPIRATORY_TRACT | Status: DC | PRN

## 2017-08-21 MED ORDER — HYDROCODONE-ACETAMINOPHEN 5-325 MG PO TABS
1.0000 | ORAL_TABLET | ORAL | Status: DC | PRN
  Administered 2017-08-22: 2 via ORAL
  Administered 2017-08-23: 1 via ORAL
  Filled 2017-08-21: qty 2
  Filled 2017-08-21: qty 1

## 2017-08-21 MED ORDER — ONDANSETRON HCL 4 MG PO TABS: 4.0000 mg | ORAL_TABLET | Freq: Four times a day (QID) | ORAL | Status: DC | PRN

## 2017-08-21 MED ORDER — SODIUM CHLORIDE 0.9 % IV SOLN
INTRAVENOUS | Status: AC
  Administered 2017-08-21: 21:00:00 via INTRAVENOUS

## 2017-08-21 MED ORDER — IOPAMIDOL (ISOVUE-300) INJECTION 61%
100.0000 mL | Freq: Once | INTRAVENOUS | Status: AC | PRN
  Administered 2017-08-21: 100 mL via INTRAVENOUS

## 2017-08-21 MED ORDER — ATORVASTATIN CALCIUM 40 MG PO TABS
40.0000 mg | ORAL_TABLET | Freq: Every day | ORAL | Status: DC
  Administered 2017-08-22 – 2017-08-24 (×3): 40 mg via ORAL
  Filled 2017-08-21 (×3): qty 1

## 2017-08-21 MED ORDER — ACETAMINOPHEN 650 MG RE SUPP: 650.0000 mg | Freq: Four times a day (QID) | RECTAL | Status: DC | PRN

## 2017-08-21 MED ORDER — CLONAZEPAM 0.5 MG PO TABS
0.5000 mg | ORAL_TABLET | Freq: Two times a day (BID) | ORAL | Status: DC
  Administered 2017-08-22 – 2017-08-25 (×8): 0.5 mg via ORAL
  Filled 2017-08-21 (×8): qty 1

## 2017-08-21 MED ORDER — PRIMIDONE 50 MG PO TABS
50.0000 mg | ORAL_TABLET | Freq: Every day | ORAL | Status: DC
  Administered 2017-08-22 – 2017-08-24 (×4): 50 mg via ORAL
  Filled 2017-08-21 (×4): qty 1

## 2017-08-21 MED ORDER — BISACODYL 5 MG PO TBEC: 5.0000 mg | DELAYED_RELEASE_TABLET | Freq: Every day | ORAL | Status: DC | PRN

## 2017-08-21 MED ORDER — ASPIRIN 81 MG PO CHEW
81.0000 mg | CHEWABLE_TABLET | Freq: Every day | ORAL | Status: DC
  Administered 2017-08-22 – 2017-08-25 (×4): 81 mg via ORAL
  Filled 2017-08-21 (×4): qty 1

## 2017-08-21 MED ORDER — DIVALPROEX SODIUM 250 MG PO DR TAB
250.0000 mg | DELAYED_RELEASE_TABLET | Freq: Three times a day (TID) | ORAL | Status: DC
  Administered 2017-08-22 – 2017-08-25 (×12): 250 mg via ORAL
  Filled 2017-08-21 (×12): qty 1

## 2017-08-21 MED ORDER — CIPROFLOXACIN IN D5W 400 MG/200ML IV SOLN
400.0000 mg | Freq: Once | INTRAVENOUS | Status: AC
  Administered 2017-08-21: 400 mg via INTRAVENOUS
  Filled 2017-08-21: qty 200

## 2017-08-21 MED ORDER — ACETAMINOPHEN 325 MG PO TABS
650.0000 mg | ORAL_TABLET | Freq: Four times a day (QID) | ORAL | Status: DC | PRN
  Administered 2017-08-23: 650 mg via ORAL
  Filled 2017-08-21: qty 2

## 2017-08-21 NOTE — ED Provider Notes (Signed)
[facility] EMERGENCY DEPARTMENT Provider Note   CSN: [id number] Arrival date & time: 08/21/17  1647     History   Chief Complaint Chief Complaint  Patient presents with  . Abdominal Pain    HPI Laurie Moreno is a 67 y.o. female.   Abdominal Pain    Pt was seen at 1650.  Per NH report and pt, c/o gradual onset and persistence of constant generalized abd "pain" since yesterday.  Has been associated with multiple intermittent episodes of N/V/D. Describes the abd pain as "cramping." Pt also states she continues to "cough" since her hospital discharge 08/08/2017 for pneumonia. Pt did not know she had a fever until arrival to the ED. Denies back pain, no rash, no CP/SOB, no black or blood in stools or emesis.      Past Medical History:  Diagnosis Date  . Anxiety   . Bipolar disorder, unspecified ([place])    From [facility] form  . COPD (chronic obstructive pulmonary disease) ([place])   . Dermatitis   . Dysrhythmia   . Hyperlipidemia   . Urinary incontinence     Patient Active Problem List   Diagnosis Date Noted  . Acute metabolic encephalopathy 81/82/9937  . HCAP (healthcare-associated pneumonia) 08/07/2017  . Bipolar 1 disorder ([place]) 08/07/2017  . Essential tremor 08/07/2017  . Acute on chronic respiratory failure with hypoxia ([place]) 08/07/2017  . Altered mental status 08/06/2017    History reviewed. No pertinent surgical history.   OB History   None      Home Medications    Prior to Admission medications   Medication Sig Start Date End Date Taking? Authorizing Provider  aspirin 81 MG chewable tablet Chew 81 mg by mouth daily.    [provider]  atorvastatin (LIPITOR) 40 MG tablet Take 40 mg by mouth daily.    [provider]  carvedilol (COREG) 3.125 MG tablet Take 3.125 mg by mouth 2 (two) times daily with a meal.    [provider]  clonazePAM (KLONOPIN) 0.5 MG tablet Take 0.5 mg by mouth 2 (two) times daily.    [provider]   divalproex (DEPAKOTE) 250 MG DR tablet Take 250 mg by mouth 3 (three) times daily.    [provider]  escitalopram (LEXAPRO) 10 MG tablet Take 10 mg by mouth daily.    [provider]  gabapentin (NEURONTIN) 300 MG capsule Take 300 mg by mouth 4 (four) times daily.    [provider]  hydrOXYzine (ATARAX/VISTARIL) 25 MG tablet Take 25 mg by mouth every 8 (eight) hours as needed.    [provider]  ipratropium-albuterol (DUONEB) 0.5-2.5 (3) MG/3ML SOLN Take 3 mLs by nebulization every 6 (six) hours as needed.    [provider]  levofloxacin (LEVAQUIN) 500 MG tablet Take 1 tablet (500 mg total) by mouth daily. 08/09/17   Isaac Bliss, Rayford Halsted, MD  levothyroxine (SYNTHROID, LEVOTHROID) 100 MCG tablet Take 100 mcg by mouth daily before breakfast.    [provider]  loratadine (CLARITIN) 10 MG tablet Take 10 mg by mouth daily.    [provider]  Multiple Vitamin (MULTIVITAMIN) tablet Take 1 tablet by mouth daily.    [provider]  primidone (MYSOLINE) 50 MG tablet Take 50 mg by mouth daily at 8 pm.    [provider]  zinc oxide 20 % ointment Apply 1 application topically as needed for irritation.    [provider]    Family History No family  history on file.  Social History Social History   Tobacco Use  . Smoking status: Current Some Day Smoker  . Smokeless tobacco: Never Used  Substance Use Topics  . Alcohol use: Not Currently  . Drug use: Not Currently     Allergies   Codeine and Penicillins   Review of Systems Review of Systems  Gastrointestinal: Positive for abdominal pain.  ROS: Statement: All systems negative except as marked or noted in the HPI; Constitutional: Negative for chills. ; ; Eyes: Negative for eye pain, redness and discharge. ; ; ENMT: Negative for ear pain, hoarseness, nasal congestion, sinus pressure and sore throat. ; ; Cardiovascular: Negative for chest pain,  palpitations, diaphoresis, dyspnea and peripheral edema. ; ; Respiratory: +cough. Negative for wheezing and stridor. ; ; Gastrointestinal: +N/V/D, abd pain. Negative for blood in stool, hematemesis, jaundice and rectal bleeding. . ; ; Genitourinary: Negative for dysuria, flank pain and hematuria. ; ; Musculoskeletal: Negative for back pain and neck pain. Negative for swelling and trauma.; ; Skin: Negative for pruritus, rash, abrasions, blisters, bruising and skin lesion.; ; Neuro: Negative for headache, lightheadedness and neck stiffness. Negative for weakness, altered level of consciousness, altered mental status, extremity weakness, paresthesias, involuntary movement, seizure and syncope.      Physical Exam Updated Vital Signs BP 140/77   Pulse 91   Temp (!) 102.3 F (39.1 C) (Oral)   Resp (!) 25   Ht 5\' 3"  (1.6 m)   Wt 81.6 kg (180 lb)   SpO2 94%   BMI 31.89 kg/m    Patient Vitals for the past 24 hrs:  BP Temp Temp src Pulse Resp SpO2 Height Weight  08/21/17 1804 140/77 - - 91 (!) 25 94 % - -  08/21/17 1645 (!) 142/77 (!) 102.3 F (39.1 C) Oral 94 - 90 % - -  08/21/17 1643 - - - - - - 5\' 3"  (1.6 m) 81.6 kg (180 lb)      Physical Exam 1655: Physical examination:  Nursing notes reviewed; Vital signs and O2 SAT reviewed;  Constitutional: Well developed, Well nourished, In no acute distress; Head:  Normocephalic, atraumatic; Eyes: EOMI, PERRL, No scleral icterus; ENMT: Mouth and pharynx normal, Mucous membranes dry; Neck: Supple, Full range of motion, No lymphadenopathy; Cardiovascular: Regular rate and rhythm, No gallop; Respiratory: Breath sounds clear & equal bilaterally, No wheezes.  Speaking full sentences with ease, Normal respiratory effort/excursion; Chest: Nontender, Movement normal; Abdomen: Soft, +diffuse tenderness to palp. No rebound or guarding. Nondistended, Normal bowel sounds; Genitourinary: No CVA tenderness; Extremities: Peripheral pulses normal, No tenderness, +1 pedal  edema bilat. No calf asymmetry.; Neuro: AA&Ox3, Major CN grossly intact.  Speech clear. No gross focal motor deficits in extremities.; Skin: Color normal, Warm, Dry.    ED Treatments / Results  Labs (all labs ordered are listed, but only abnormal results are displayed)   EKG EKG Interpretation  Date/Time:  Saturday August 21 2017 17:51:13 EDT Ventricular Rate:  90 PR Interval:    QRS Duration: 100 QT Interval:  328 QTC Calculation: 402 R Axis:   -47 Text Interpretation:  Sinus rhythm Left anterior fascicular block Abnormal R-wave progression, late transition Borderline T wave abnormalities Artifact When compared with ECG of 08/05/2017 Artifact is now Present Otherwise no significant change Confirmed by Francine Graven [phone]) on 08/21/2017 6:02:17 PM   Radiology   Procedures Procedures (including critical care time)  Medications Ordered in ED Medications  acetaminophen (TYLENOL) tablet 1,000 mg (has no administration in time range)  Initial Impression / Assessment and Plan / ED Course  I have reviewed the triage vital signs and the nursing notes.  Pertinent labs & imaging results that were available during my care of the patient were reviewed by me and considered in my medical decision making (see chart for details).  MDM Reviewed: previous chart, nursing note and vitals Reviewed previous: labs and ECG Interpretation: labs, ECG, x-ray and CT scan   Results for orders placed or performed during the hospital encounter of 08/21/17  Lipase, blood  Result Value Ref Range   Lipase 48 11 - 51 U/L  Comprehensive metabolic panel  Result Value Ref Range   Sodium 141 135 - 145 mmol/L   Potassium 4.4 3.5 - 5.1 mmol/L   Chloride 103 101 - 111 mmol/L   CO2 25 22 - 32 mmol/L   Glucose, Bld 182 (H) 65 - 99 mg/dL   BUN 16 6 - 20 mg/dL   Creatinine, Ser 0.78 0.44 - 1.00 mg/dL   Calcium 9.0 8.9 - 10.3 mg/dL   Total Protein 7.1 6.5 - 8.1 g/dL   Albumin 3.9 3.5 - 5.0 g/dL    AST 17 15 - 41 U/L   ALT 11 (L) 14 - 54 U/L   Alkaline Phosphatase 45 38 - 126 U/L   Total Bilirubin 2.1 (H) 0.3 - 1.2 mg/dL   GFR calc non Af Amer >60 >60 mL/min   GFR calc Af Amer >60 >60 mL/min   Anion gap 13 5 - 15  Urinalysis, Routine w reflex microscopic  Result Value Ref Range   Color, Urine YELLOW YELLOW   APPearance HAZY (A) CLEAR   Specific Gravity, Urine 1.013 1.005 - 1.030   pH 6.0 5.0 - 8.0   Glucose, UA NEGATIVE NEGATIVE mg/dL   Hgb urine dipstick LARGE (A) NEGATIVE   Bilirubin Urine NEGATIVE NEGATIVE   Ketones, ur 5 (A) NEGATIVE mg/dL   Protein, ur 100 (A) NEGATIVE mg/dL   Nitrite NEGATIVE NEGATIVE   Leukocytes, UA MODERATE (A) NEGATIVE   RBC / HPF TOO NUMEROUS TO COUNT 0 - 5 RBC/hpf   WBC, UA TOO NUMEROUS TO COUNT 0 - 5 WBC/hpf   Bacteria, UA RARE (A) NONE SEEN   Squamous Epithelial / LPF 0-5 (A) NONE SEEN   WBC Clumps PRESENT   Troponin I  Result Value Ref Range   Troponin I <0.03 <0.03 ng/mL  Lactic acid, plasma  Result Value Ref Range   Lactic Acid, Venous 1.9 0.5 - 1.9 mmol/L  Lactic acid, plasma  Result Value Ref Range   Lactic Acid, Venous 1.5 0.5 - 1.9 mmol/L  CBC with Differential  Result Value Ref Range   WBC 8.6 4.0 - 10.5 K/uL   RBC 4.42 3.87 - 5.11 MIL/uL   Hemoglobin 13.7 12.0 - 15.0 g/dL   HCT 42.0 36.0 - 46.0 %   MCV 95.0 78.0 - 100.0 fL   MCH 31.0 26.0 - 34.0 pg   MCHC 32.6 30.0 - 36.0 g/dL   RDW 13.1 11.5 - 15.5 %   Platelets 92 (L) 150 - 400 K/uL   Neutrophils Relative % 61 %   Neutro Abs 5.2 1.7 - 7.7 K/uL   Lymphocytes Relative 18 %   Lymphs Abs 1.5 0.7 - 4.0 K/uL   Monocytes Relative 21 %   Monocytes Absolute 1.8 (H) 0.1 - 1.0 K/uL   Eosinophils Relative 0 %   Eosinophils Absolute 0.0 0.0 - 0.7 K/uL   Basophils Relative 0 %  Basophils Absolute 0.0 0.0 - 0.1 K/uL    Dg Chest 2 View Result Date: 08/21/2017 CLINICAL DATA:  Fever, cough, diarrhea, and abdominal pain. EXAM: CHEST - 2 VIEW COMPARISON:  08/05/2017 FINDINGS:  Heart is enlarged. Patient is slightly rotated towards the LEFT. There are no focal consolidations or pleural effusions. No evidence for pulmonary edema. IMPRESSION: Stable cardiomegaly. Electronically Signed   By: Nolon Nations M.D.   On: 08/21/2017 18:59    Ct Abdomen Pelvis W Contrast Result Date: 08/21/2017 CLINICAL DATA:  Abdominal pain EXAM: CT ABDOMEN AND PELVIS WITH CONTRAST TECHNIQUE: Multidetector CT imaging of the abdomen and pelvis was performed using the standard protocol following bolus administration of intravenous contrast. CONTRAST:  151mL ISOVUE-300 IOPAMIDOL (ISOVUE-300) INJECTION 61% COMPARISON:  08/12/2015 FINDINGS: Lower chest: Lung bases are well aerated with stable right lower lobe nodule when compared with the prior exam. Hepatobiliary: Liver demonstrates some nodularity consistent with mild underlying cirrhosis. No focal mass is seen. Gallbladder is within normal limits. Pancreas: Unremarkable. No pancreatic ductal dilatation or surrounding inflammatory changes. Spleen: Normal in size without focal abnormality. Adrenals/Urinary Tract: Adrenal glands are within normal limits. Kidneys are well visualized bilaterally. The right kidney demonstrates no renal calculi or obstructive change. The left kidney demonstrates considerable perinephric stranding as well as hydronephrosis without evidence of significant hydroureter. This is consistent with a ureteropelvic junction obstruction and is similar to that seen on the prior examination. The perinephric changes likely represent a component of pyelonephritis. Some mottled enhancement is noted in the upper pole on the left. Some slight increased attenuation in the distal ureter on the left is noted. Again no stone is identified. Stomach/Bowel: The appendix is not well visualized. No obstructive or inflammatory changes are seen. Vascular/Lymphatic: No significant vascular findings are present. No enlarged abdominal or pelvic lymph nodes.  Reproductive: Scattered small fibroids are noted. Other: No abdominal wall hernia or abnormality. No abdominopelvic ascites. Musculoskeletal: Degenerative changes of lumbar spine are noted. IMPRESSION: Changes consistent with left UPJ obstruction. Associated inflammatory changes and mottled enhancement in the left kidney are noted suggestive of pyelonephritis. Mild cirrhotic change Uterine fibroids. Electronically Signed   By: Inez Catalina M.D.   On: 08/21/2017 19:08    1925:  APAP given for fever. BC and UC obtained. +UTI, CT with pyelonephritis; given pt's allergies, will dose IV cipro and admit. T/C returned from [facility]. Myna Hidalgo, case discussed, including:  HPI, pertinent PM/SHx, VS/PE, dx testing, ED course and treatment:  Agreeable to admit.      Final Clinical Impressions(s) / ED Diagnoses   Final diagnoses:  None    ED Discharge Orders    None       Francine Graven, DO 08/23/17 2359

## 2017-08-21 NOTE — ED Triage Notes (Signed)
Pt from SNF c/o of mid abdominal pain with n/v/d since last night.   Pt has not had any n/v/d today

## 2017-08-21 NOTE — ED Notes (Signed)
Provider at bedside

## 2017-08-21 NOTE — H&P (Signed)
History and Physical    Laurie Moreno YQM:[id number] DOB: 04/10/1951 DOA: 08/21/2017  PCP: Blythe Stanford., MD   Patient coming from: SNF  Chief Complaint: Abdominal pain, N/V   HPI: Laurie Moreno is a 67 y.o. female with medical history significant for bipolar disorder, COPD, hypothyroidism, and chronic thrombocytopenia, now presenting to the emergency department from her SNF for evaluation of abdominal pain with nausea and vomiting.  Patient reports that symptoms developed yesterday.  She has not felt febrile but has documented fever.  She denies chest pain or shortness of breath, but reports residual cough from recent pneumonia.  ED Course: Upon arrival to the ED, patient is found to be febrile to 39.1 C, saturating low 90s on room air, and vitals otherwise normal.  EKG features a sinus rhythm with LAFB.  Chest x-ray is negative for acute cardiopulmonary disease.  Chemistry panel features a mild hyperbilirubinemia and hyperglycemia.  CBC is notable for a chronic stable thrombocytopenia with platelets 92,000.  Lactic acid is reassuringly normal.  CT of the abdomen and pelvis reveals a left-sided hydronephrosis, similar to scan from 2 years ago, with new findings to suggest left pyelonephritis.  Urinalysis is consistent with infection.  Blood and urine cultures were collected and the patient was treated with empiric ciprofloxacin in the ED.  She remains hemodynamically stable, in no apparent respiratory distress, and will be admitted to the medical-surgical unit for ongoing evaluation and management of pyelonephritis.  Review of Systems:  All other systems reviewed and apart from HPI, are negative.  Past Medical History:  Diagnosis Date  . Anxiety   . Bipolar disorder, unspecified ([place])    From [facility] form  . COPD (chronic obstructive pulmonary disease) ([place])   . Dermatitis   . Dysrhythmia   . Hyperlipidemia   . Urinary incontinence     History reviewed. No pertinent surgical  history.   reports that she has been smoking.  She has never used smokeless tobacco. She reports that she drank alcohol. She reports that she has current or past drug history.  Allergies  Allergen Reactions  . Penicillins Hives and Rash    Not effective per patient Pt does not know. She states it was when she was a child. unsure     No family history on file.   Prior to Admission medications   Medication Sig Start Date End Date Taking? Authorizing Provider  albuterol (PROVENTIL HFA;VENTOLIN HFA) 108 (90 Base) MCG/ACT inhaler Inhale into the lungs every 6 (six) hours as needed for wheezing or shortness of breath.   Yes [provider]  aspirin 81 MG chewable tablet Chew 81 mg by mouth daily.   Yes [provider]  atorvastatin (LIPITOR) 40 MG tablet Take 40 mg by mouth daily.   Yes [provider]  carvedilol (COREG) 3.125 MG tablet Take 3.125 mg by mouth 2 (two) times daily with a meal.   Yes [provider]  clonazePAM (KLONOPIN) 0.5 MG tablet Take 0.5 mg by mouth 2 (two) times daily.   Yes [provider]  docusate sodium (COLACE) 100 MG capsule Take 100 mg by mouth as needed for mild constipation.   Yes [provider]  escitalopram (LEXAPRO) 10 MG tablet Take 10 mg by mouth daily.   Yes [provider]  gabapentin (NEURONTIN) 300 MG capsule Take 300 mg by mouth 4 (four) times daily.   Yes [provider]  levothyroxine (SYNTHROID, LEVOTHROID) 100 MCG tablet Take 100 mcg by mouth  daily before breakfast.   Yes [provider]  Multiple Vitamin (MULTIVITAMIN) tablet Take 1 tablet by mouth daily.   Yes [provider]  divalproex (DEPAKOTE) 250 MG DR tablet Take 250 mg by mouth 3 (three) times daily.    [provider]  hydrOXYzine (ATARAX/VISTARIL) 25 MG tablet Take 25 mg by mouth every 8 (eight) hours as needed.    [provider]  ipratropium-albuterol (DUONEB) 0.5-2.5 (3)  MG/3ML SOLN Take 3 mLs by nebulization every 6 (six) hours as needed.    [provider]  loratadine (CLARITIN) 10 MG tablet Take 10 mg by mouth daily.    [provider]  primidone (MYSOLINE) 50 MG tablet Take 50 mg by mouth daily at 8 pm.    [provider]  zinc oxide 20 % ointment Apply 1 application topically as needed for irritation.    [provider]    Physical Exam: Vitals:   08/21/17 1643 08/21/17 1645 08/21/17 1804  BP:  (!) 142/77 140/77  Pulse:  94 91  Resp:   (!) 25  Temp:  (!) 102.3 F (39.1 C)   TempSrc:  Oral   SpO2:  90% 94%  Weight: 81.6 kg (180 lb)    Height: 5\' 3"  (1.6 m)        Constitutional: NAD, calm  Eyes: PERTLA, lids and conjunctivae normal ENMT: Mucous membranes are moist. Posterior pharynx clear of any exudate or lesions.   Neck: normal, supple, no masses, no thyromegaly Respiratory: clear to auscultation bilaterally, no wheezing, no crackles. Normal respiratory effort.    Cardiovascular: S1 & S2 heard, regular rate and rhythm. No significant JVD. Abdomen: No distension, tender in lower abd and left flank without rebound pain or guarding, soft. Bowel sounds normal.  Musculoskeletal: no clubbing / cyanosis. No joint deformity upper and lower extremities.   Skin: no significant rashes, lesions, ulcers. Warm, dry, well-perfused. Neurologic: CN 2-12 grossly intact. Sensation intact. Strength 5/5 in all 4 limbs.  Psychiatric: Alert and oriented x 3. Calm, cooperative.     Labs on Admission: I have personally reviewed following labs and imaging studies  CBC: Recent Labs  Lab 08/21/17 1713  WBC 8.6  NEUTROABS 5.2  HGB 13.7  HCT 42.0  MCV 95.0  PLT 92*   Basic Metabolic Panel: Recent Labs  Lab 08/21/17 1713  NA 141  K 4.4  CL 103  CO2 25  GLUCOSE 182*  BUN 16  CREATININE 0.78  CALCIUM 9.0   GFR: Estimated Creatinine Clearance: 70 mL/min (by C-G formula based on SCr of 0.78 mg/dL). Liver Function  Tests: Recent Labs  Lab 08/21/17 1713  AST 17  ALT 11*  ALKPHOS 45  BILITOT 2.1*  PROT 7.1  ALBUMIN 3.9   Recent Labs  Lab 08/21/17 1713  LIPASE 48   No results for input(s): AMMONIA in the last 168 hours. Coagulation Profile: No results for input(s): INR, PROTIME in the last 168 hours. Cardiac Enzymes: Recent Labs  Lab 08/21/17 1713  TROPONINI <0.03   BNP (last 3 results) No results for input(s): PROBNP in the last 8760 hours. HbA1C: No results for input(s): HGBA1C in the last 72 hours. CBG: No results for input(s): GLUCAP in the last 168 hours. Lipid Profile: No results for input(s): CHOL, HDL, LDLCALC, TRIG, CHOLHDL, LDLDIRECT in the last 72 hours. Thyroid Function Tests: No results for input(s): TSH, T4TOTAL, FREET4, T3FREE, THYROIDAB in the last 72 hours. Anemia Panel: No results for input(s): VITAMINB12, FOLATE, FERRITIN,  TIBC, IRON, RETICCTPCT in the last 72 hours. Urine analysis:    Component Value Date/Time   COLORURINE YELLOW 08/21/2017 1759   APPEARANCEUR HAZY (A) 08/21/2017 1759   LABSPEC 1.013 08/21/2017 1759   PHURINE 6.0 08/21/2017 1759   GLUCOSEU NEGATIVE 08/21/2017 1759   HGBUR LARGE (A) 08/21/2017 1759   BILIRUBINUR NEGATIVE 08/21/2017 1759   KETONESUR 5 (A) 08/21/2017 1759   PROTEINUR 100 (A) 08/21/2017 1759   NITRITE NEGATIVE 08/21/2017 1759   LEUKOCYTESUR MODERATE (A) 08/21/2017 1759   Sepsis Labs: @LABRCNTIP (procalcitonin:4,lacticidven:4) )No results found for this or any previous visit (from the past 240 hour(s)).   Radiological Exams on Admission: Dg Chest 2 View  Result Date: 08/21/2017 CLINICAL DATA:  Fever, cough, diarrhea, and abdominal pain. EXAM: CHEST - 2 VIEW COMPARISON:  08/05/2017 FINDINGS: Heart is enlarged. Patient is slightly rotated towards the LEFT. There are no focal consolidations or pleural effusions. No evidence for pulmonary edema. IMPRESSION: Stable cardiomegaly. Electronically Signed   By: Nolon Nations M.D.    On: 08/21/2017 18:59   Ct Abdomen Pelvis W Contrast  Result Date: 08/21/2017 CLINICAL DATA:  Abdominal pain EXAM: CT ABDOMEN AND PELVIS WITH CONTRAST TECHNIQUE: Multidetector CT imaging of the abdomen and pelvis was performed using the standard protocol following bolus administration of intravenous contrast. CONTRAST:  135mL ISOVUE-300 IOPAMIDOL (ISOVUE-300) INJECTION 61% COMPARISON:  08/12/2015 FINDINGS: Lower chest: Lung bases are well aerated with stable right lower lobe nodule when compared with the prior exam. Hepatobiliary: Liver demonstrates some nodularity consistent with mild underlying cirrhosis. No focal mass is seen. Gallbladder is within normal limits. Pancreas: Unremarkable. No pancreatic ductal dilatation or surrounding inflammatory changes. Spleen: Normal in size without focal abnormality. Adrenals/Urinary Tract: Adrenal glands are within normal limits. Kidneys are well visualized bilaterally. The right kidney demonstrates no renal calculi or obstructive change. The left kidney demonstrates considerable perinephric stranding as well as hydronephrosis without evidence of significant hydroureter. This is consistent with a ureteropelvic junction obstruction and is similar to that seen on the prior examination. The perinephric changes likely represent a component of pyelonephritis. Some mottled enhancement is noted in the upper pole on the left. Some slight increased attenuation in the distal ureter on the left is noted. Again no stone is identified. Stomach/Bowel: The appendix is not well visualized. No obstructive or inflammatory changes are seen. Vascular/Lymphatic: No significant vascular findings are present. No enlarged abdominal or pelvic lymph nodes. Reproductive: Scattered small fibroids are noted. Other: No abdominal wall hernia or abnormality. No abdominopelvic ascites. Musculoskeletal: Degenerative changes of lumbar spine are noted. IMPRESSION: Changes consistent with left UPJ  obstruction. Associated inflammatory changes and mottled enhancement in the left kidney are noted suggestive of pyelonephritis. Mild cirrhotic change Uterine fibroids. Electronically Signed   By: Inez Catalina M.D.   On: 08/21/2017 19:08    EKG: Independently reviewed. Sinus rhythm, LAFB.   Assessment/Plan   1. Pyelonephritis; chronic left UPJ obstruction  - Presents with abdominal pain, nausea, and vomiting, and is found to be febrile with UA suggestive of infection and CT-findings consistent with chronic left UPJ obstruction and pyelonephritis  - Renal fxn is preserved  - Blood and urine cultures collected in ED and empiric ciprofloxacin started  - Discussed with urology and advised to treat with abx and have her follow-up with urology after discharge  - Continue empiric abx, follow cultures and clinical course   2. COPD  - No wheezing or dyspnea on admission   - Continue prn nebs  3. Bipolar disorder  - Stable, continue Depakote, Lexapro, and Klonopin    4. Thrombocytosis  - Platelets stable at 92,000 without bleeding  - Likely secondary to chronic liver disease     DVT prophylaxis: SCD's  Code Status: Full  Family Communication: Family updated at bedside Consults called: Discussed case with urology Admission status: Inpatient    Vianne Bulls, MD [facility] Pager [phone]  If 7PM-7AM, please contact night-coverage [email or web address] Password [facility]  08/21/2017, 8:04 PM

## 2017-08-21 NOTE — ED Notes (Signed)
Call Pt's Sister at [phone] with any updates on Pt's status in the morning.

## 2017-08-22 DIAGNOSIS — N12 Tubulo-interstitial nephritis, not specified as acute or chronic: Secondary | ICD-10-CM

## 2017-08-22 DIAGNOSIS — F319 Bipolar disorder, unspecified: Secondary | ICD-10-CM

## 2017-08-22 LAB — CBC WITH DIFFERENTIAL/PLATELET
BASOS ABS: 0 10*3/uL (ref 0.0–0.1)
BASOS PCT: 0 %
Eosinophils Absolute: 0 10*3/uL (ref 0.0–0.7)
Eosinophils Relative: 0 %
HEMATOCRIT: 41.9 % (ref 36.0–46.0)
Hemoglobin: 13.1 g/dL (ref 12.0–15.0)
Lymphocytes Relative: 19 %
Lymphs Abs: 1.5 10*3/uL (ref 0.7–4.0)
MCH: 30.5 pg (ref 26.0–34.0)
MCHC: 31.3 g/dL (ref 30.0–36.0)
MCV: 97.7 fL (ref 78.0–100.0)
MONO ABS: 1.5 10*3/uL — AB (ref 0.1–1.0)
Monocytes Relative: 19 %
NEUTROS ABS: 5.1 10*3/uL (ref 1.7–7.7)
Neutrophils Relative %: 62 %
PLATELETS: 82 10*3/uL — AB (ref 150–400)
RBC: 4.29 MIL/uL (ref 3.87–5.11)
RDW: 13.6 % (ref 11.5–15.5)
WBC: 8.2 10*3/uL (ref 4.0–10.5)

## 2017-08-22 LAB — COMPREHENSIVE METABOLIC PANEL
ALT: 11 U/L — ABNORMAL LOW (ref 14–54)
ANION GAP: 10 (ref 5–15)
AST: 12 U/L — AB (ref 15–41)
Albumin: 3.5 g/dL (ref 3.5–5.0)
Alkaline Phosphatase: 39 U/L (ref 38–126)
BILIRUBIN TOTAL: 2.2 mg/dL — AB (ref 0.3–1.2)
BUN: 13 mg/dL (ref 6–20)
CO2: 25 mmol/L (ref 22–32)
Calcium: 8.3 mg/dL — ABNORMAL LOW (ref 8.9–10.3)
Chloride: 105 mmol/L (ref 101–111)
Creatinine, Ser: 0.71 mg/dL (ref 0.44–1.00)
GFR calc Af Amer: >60 mL/min (ref >60)
GFR calc non Af Amer: >60 mL/min (ref >60)
GLUCOSE: 136 mg/dL — AB (ref 65–99)
POTASSIUM: 4.7 mmol/L (ref 3.5–5.1)
Sodium: 140 mmol/L (ref 135–145)
Total Protein: 6.8 g/dL (ref 6.5–8.1)

## 2017-08-22 MED ORDER — CIPROFLOXACIN IN D5W 400 MG/200ML IV SOLN
400.0000 mg | Freq: Two times a day (BID) | INTRAVENOUS | Status: DC
  Administered 2017-08-22 – 2017-08-24 (×4): 400 mg via INTRAVENOUS
  Filled 2017-08-22 (×4): qty 200

## 2017-08-22 MED ORDER — SODIUM CHLORIDE 0.9 % IV SOLN
1.0000 g | Freq: Three times a day (TID) | INTRAVENOUS | Status: DC
  Administered 2017-08-22 (×2): 1 g via INTRAVENOUS
  Filled 2017-08-22 (×4): qty 1

## 2017-08-22 NOTE — Progress Notes (Signed)
Pharmacy Antibiotic Note  Laurie Moreno is a 67 y.o. female admitted on 08/21/2017 with UTI / pyelo w/ obstruction.  Pharmacy has been consulted for MEROPENEM dosing.  Plan: Meropenem 1gm IV q8h Monitor labs, progress, c/s  Height: 5\' 3"  (160 cm) Weight: 193 lb 9 oz (87.8 kg) IBW/kg (Calculated) : 52.4  Temp (24hrs), Avg:100.1 F (37.8 C), Min:98.9 F (37.2 C), Max:102.3 F (39.1 C)  Recent Labs  Lab 08/21/17 1713 08/21/17 1910 08/22/17 0616  WBC 8.6  --  8.2  CREATININE 0.78  --  0.71  LATICACIDVEN 1.9 1.5  --     Estimated Creatinine Clearance: 72.7 mL/min (by C-G formula based on SCr of 0.71 mg/dL).    Allergies  Allergen Reactions  . Penicillins Hives and Rash    Not effective per patient Pt does not know. She states it was when she was a child. unsure    Antimicrobials this admission: Meropenem 4/21 >>   Dose adjustments this admission:  Microbiology results:  BCx: pending  UCx: pending   Sputum:    MRSA PCR:   Thank you for allowing pharmacy to be a part of this patient's care.  Hart Robinsons A 08/22/2017 9:26 AM

## 2017-08-22 NOTE — Progress Notes (Signed)
PROGRESS NOTE    Florene Brill  RWE:[id number] DOB: [birth year] DOA: 08/21/2017 PCP: Blythe Stanford., MD     Brief Narrative:  67 year old woman admitted from home on 4/20 due to abdominal pain, nausea, vomiting.  She has a history significant for bipolar disorder, COPD, hypothyroidism and chronic thrombocytopenia.  In the ED she was found to be febrile, CT scan of the abdomen shows left-sided hydronephrosis which is similar to scan from 2 years ago and acute findings concerning for pyelonephritis.  Admission was requested.   Assessment & Plan:   Principal Problem:   Pyelonephritis Active Problems:   Bipolar 1 disorder (HCC)   COPD (chronic obstructive pulmonary disease) (HCC)   Thrombocytopenia (HCC)   Pyelonephritis -Continue Cipro pending culture data. -Given findings of hydronephrosis that was observed on CT scan, urology was consulted by EDP who recommended outpatient follow-up given these are chronic findings. -Patient is symptomatically improved and would anticipate discharge home over the next 24 hours.  Bipolar disorder -Mood is stable, continue psychotropic medications.  COPD -Stable, compensated.  Thrombocytopenia -Platelet count is stable at around 80,000, likely secondary to chronic liver disease.   DVT prophylaxis: SCDs Code Status: Full code Family Communication: Patient only Disposition Plan: Anticipate discharge home over next 24 hours with outpatient urology follow-up  Consultants:   None  Procedures:   None  Antimicrobials:  Anti-infectives (From admission, onward)   Start     Dose/Rate Route Frequency Ordered Stop   08/22/17 0900  meropenem (MERREM) 1 g in sodium chloride 0.9 % 100 mL IVPB     1 g 200 mL/hr over 30 Minutes Intravenous Every 8 hours 08/22/17 0739     08/21/17 2130  meropenem (MERREM) 1 g in sodium chloride 0.9 % 100 mL IVPB     1 g 200 mL/hr over 30 Minutes Intravenous  Once 08/21/17 2117 08/22/17 0030   08/21/17 1915   ciprofloxacin (CIPRO) IVPB 400 mg     400 mg 200 mL/hr over 60 Minutes Intravenous  Once 08/21/17 1914 08/21/17 2113       Subjective: Lying in bed, feels weak but other than this no complaints, no further nausea or vomiting, tolerating diet without issues.  Objective: Vitals:   08/21/17 2147 08/22/17 0526 08/22/17 1417 08/22/17 1442  BP: (!) 146/81 137/80 122/67 121/60  Pulse: 85 95 91 91  Resp:  (!) 21 20 20   Temp: 99 F (37.2 C) 98.9 F (37.2 C) 97.6 F (36.4 C) 97.6 F (36.4 C)  TempSrc: Oral Oral    SpO2: 95% (!) 88% 92% 99%  Weight: 87.8 kg (193 lb 9 oz)     Height: 5\' 3"  (1.6 m)       Intake/Output Summary (Last 24 hours) at 08/22/2017 1705 Last data filed at 08/22/2017 1546 Gross per 24 hour  Intake 1427.5 ml  Output 1350 ml  Net 77.5 ml   Filed Weights   08/21/17 1643 08/21/17 2147  Weight: 81.6 kg (180 lb) 87.8 kg (193 lb 9 oz)    Examination:  General exam: Alert, awake, oriented x 3 Respiratory system: Clear to auscultation. Respiratory effort normal. Cardiovascular system:RRR. No murmurs, rubs, gallops. Gastrointestinal system: Abdomen is nondistended, soft and nontender. No organomegaly or masses felt. Normal bowel sounds heard. Central nervous system: Alert and oriented. No focal neurological deficits. Extremities: No C/C/E, +pedal pulses Skin: No rashes, lesions or ulcers Psychiatry: Judgement and insight appear normal. Mood & affect appropriate.     Data Reviewed: I have personally reviewed  following labs and imaging studies  CBC: Recent Labs  Lab 08/21/17 1713 08/22/17 0616  WBC 8.6 8.2  NEUTROABS 5.2 5.1  HGB 13.7 13.1  HCT 42.0 41.9  MCV 95.0 97.7  PLT 92* 82*   Basic Metabolic Panel: Recent Labs  Lab 08/21/17 1713 08/22/17 0616  NA 141 140  K 4.4 4.7  CL 103 105  CO2 25 25  GLUCOSE 182* 136*  BUN 16 13  CREATININE 0.78 0.71  CALCIUM 9.0 8.3*   GFR: Estimated Creatinine Clearance: 72.7 mL/min (by C-G formula based on  SCr of 0.71 mg/dL). Liver Function Tests: Recent Labs  Lab 08/21/17 1713 08/22/17 0616  AST 17 12*  ALT 11* 11*  ALKPHOS 45 39  BILITOT 2.1* 2.2*  PROT 7.1 6.8  ALBUMIN 3.9 3.5   Recent Labs  Lab 08/21/17 1713  LIPASE 48   No results for input(s): AMMONIA in the last 168 hours. Coagulation Profile: No results for input(s): INR, PROTIME in the last 168 hours. Cardiac Enzymes: Recent Labs  Lab 08/21/17 1713  TROPONINI <0.03   BNP (last 3 results) No results for input(s): PROBNP in the last 8760 hours. HbA1C: No results for input(s): HGBA1C in the last 72 hours. CBG: No results for input(s): GLUCAP in the last 168 hours. Lipid Profile: No results for input(s): CHOL, HDL, LDLCALC, TRIG, CHOLHDL, LDLDIRECT in the last 72 hours. Thyroid Function Tests: No results for input(s): TSH, T4TOTAL, FREET4, T3FREE, THYROIDAB in the last 72 hours. Anemia Panel: No results for input(s): VITAMINB12, FOLATE, FERRITIN, TIBC, IRON, RETICCTPCT in the last 72 hours. Urine analysis:    Component Value Date/Time   COLORURINE YELLOW 08/21/2017 1759   APPEARANCEUR HAZY (A) 08/21/2017 1759   LABSPEC 1.013 08/21/2017 1759   PHURINE 6.0 08/21/2017 1759   GLUCOSEU NEGATIVE 08/21/2017 1759   HGBUR LARGE (A) 08/21/2017 1759   BILIRUBINUR NEGATIVE 08/21/2017 1759   KETONESUR 5 (A) 08/21/2017 1759   PROTEINUR 100 (A) 08/21/2017 1759   NITRITE NEGATIVE 08/21/2017 1759   LEUKOCYTESUR MODERATE (A) 08/21/2017 1759   Sepsis Labs: @LABRCNTIP (procalcitonin:4,lacticidven:4)  ) Recent Results (from the past 240 hour(s))  Culture, blood (routine x 2)     Status: None (Preliminary result)   Collection Time: 08/21/17  5:13 PM  Result Value Ref Range Status   Specimen Description BLOOD  Final   Special Requests NONE  Final   Culture   Final    NO GROWTH < 12 HOURS Performed at [facility], [address], [place], [place] [address]    Report Status PENDING  Incomplete  Culture, blood (routine x  2)     Status: None (Preliminary result)   Collection Time: 08/21/17  5:15 PM  Result Value Ref Range Status   Specimen Description BLOOD  Final   Special Requests NONE  Final   Culture   Final    NO GROWTH < 12 HOURS Performed at [facility], [address], [place], [place] [address]    Report Status PENDING  Incomplete  Urine culture     Status: Abnormal (Preliminary result)   Collection Time: 08/21/17  5:59 PM  Result Value Ref Range Status   Specimen Description   Final    URINE, CLEAN CATCH Performed at [facility], [address] [place], [place] [address]    Special Requests   Final    NONE Performed at [facility], [address] [place], [place] [address]    Culture (A)  Final    >=100,000  COLONIES/mL ESCHERICHIA COLI SUSCEPTIBILITIES TO FOLLOW Performed at [facility] [address], [place], [place] [address]    Report Status PENDING  Incomplete         Radiology Studies: Dg Chest 2 View  Result Date: 08/21/2017 CLINICAL DATA:  Fever, cough, diarrhea, and abdominal pain. EXAM: CHEST - 2 VIEW COMPARISON:  08/05/2017 FINDINGS: Heart is enlarged. Patient is slightly rotated towards the LEFT. There are no focal consolidations or pleural effusions. No evidence for pulmonary edema. IMPRESSION: Stable cardiomegaly. Electronically Signed   By: Nolon Nations M.D.   On: 08/21/2017 18:59   Ct Abdomen Pelvis W Contrast  Result Date: 08/21/2017 CLINICAL DATA:  Abdominal pain EXAM: CT ABDOMEN AND PELVIS WITH CONTRAST TECHNIQUE: Multidetector CT imaging of the abdomen and pelvis was performed using the standard protocol following bolus administration of intravenous contrast. CONTRAST:  137mL ISOVUE-300 IOPAMIDOL (ISOVUE-300) INJECTION 61% COMPARISON:  08/12/2015 FINDINGS: Lower chest: Lung bases are well aerated with stable right lower lobe nodule when compared with the prior exam. Hepatobiliary: Liver demonstrates some nodularity consistent with mild underlying  cirrhosis. No focal mass is seen. Gallbladder is within normal limits. Pancreas: Unremarkable. No pancreatic ductal dilatation or surrounding inflammatory changes. Spleen: Normal in size without focal abnormality. Adrenals/Urinary Tract: Adrenal glands are within normal limits. Kidneys are well visualized bilaterally. The right kidney demonstrates no renal calculi or obstructive change. The left kidney demonstrates considerable perinephric stranding as well as hydronephrosis without evidence of significant hydroureter. This is consistent with a ureteropelvic junction obstruction and is similar to that seen on the prior examination. The perinephric changes likely represent a component of pyelonephritis. Some mottled enhancement is noted in the upper pole on the left. Some slight increased attenuation in the distal ureter on the left is noted. Again no stone is identified. Stomach/Bowel: The appendix is not well visualized. No obstructive or inflammatory changes are seen. Vascular/Lymphatic: No significant vascular findings are present. No enlarged abdominal or pelvic lymph nodes. Reproductive: Scattered small fibroids are noted. Other: No abdominal wall hernia or abnormality. No abdominopelvic ascites. Musculoskeletal: Degenerative changes of lumbar spine are noted. IMPRESSION: Changes consistent with left UPJ obstruction. Associated inflammatory changes and mottled enhancement in the left kidney are noted suggestive of pyelonephritis. Mild cirrhotic change Uterine fibroids. Electronically Signed   By: Inez Catalina M.D.   On: 08/21/2017 19:08        Scheduled Meds: . aspirin  81 mg Oral Daily  . atorvastatin  40 mg Oral q1800  . carvedilol  3.125 mg Oral BID WC  . clonazePAM  0.5 mg Oral BID  . divalproex  250 mg Oral TID  . escitalopram  10 mg Oral Daily  . gabapentin  300 mg Oral QID  . levothyroxine  100 mcg Oral QAC breakfast  . primidone  50 mg Oral Q2000   Continuous Infusions: . meropenem  (MERREM) IV Stopped (08/22/17 1552)     LOS: 1 day    Time spent: 25 minutes.      Lelon Frohlich, MD [facility] Pager [phone]  If 7PM-7AM, please contact night-coverage [email or web address] Password [facility] 08/22/2017, 5:05 PM

## 2017-08-23 LAB — BASIC METABOLIC PANEL
Anion gap: 9 (ref 5–15)
BUN: 14 mg/dL (ref 6–20)
CO2: 28 mmol/L (ref 22–32)
Calcium: 8.7 mg/dL — ABNORMAL LOW (ref 8.9–10.3)
Chloride: 103 mmol/L (ref 101–111)
Creatinine, Ser: 0.75 mg/dL (ref 0.44–1.00)
GFR calc Af Amer: >60 mL/min (ref >60)
GLUCOSE: 150 mg/dL — AB (ref 65–99)
Potassium: 4.7 mmol/L (ref 3.5–5.1)
Sodium: 140 mmol/L (ref 135–145)

## 2017-08-23 LAB — CBC
HEMATOCRIT: 37.5 % (ref 36.0–46.0)
Hemoglobin: 12 g/dL (ref 12.0–15.0)
MCH: 31 pg (ref 26.0–34.0)
MCHC: 32 g/dL (ref 30.0–36.0)
MCV: 96.9 fL (ref 78.0–100.0)
Platelets: 92 10*3/uL — ABNORMAL LOW (ref 150–400)
RBC: 3.87 MIL/uL (ref 3.87–5.11)
RDW: 13.1 % (ref 11.5–15.5)
WBC: 6.5 10*3/uL (ref 4.0–10.5)

## 2017-08-23 NOTE — Evaluation (Signed)
Clinical/Bedside Swallow Evaluation Patient Details  Name: Laurie Moreno MRN: [id number] Date of Birth: 1950-08-07  Today's Date: 08/23/2017 Time: SLP Start Time (ACUTE ONLY): 1455 SLP Stop Time (ACUTE ONLY): 1513 SLP Time Calculation (min) (ACUTE ONLY): 18 min  Past Medical History:  Past Medical History:  Diagnosis Date  . Anxiety   . Bipolar disorder, unspecified ([place])    From [facility] form  . COPD (chronic obstructive pulmonary disease) ([place])   . Dermatitis   . Dysrhythmia   . Hyperlipidemia   . Urinary incontinence    Past Surgical History: History reviewed. No pertinent surgical history. HPI:  67 year old woman admitted from home on 4/20 due to abdominal pain, nausea, vomiting.  She has a history significant for bipolar disorder, COPD, hypothyroidism and chronic thrombocytopenia.  In the ED she was found to be febrile, CT scan of the abdomen shows left-sided hydronephrosis which is similar to scan from 2 years ago and acute findings concerning for pyelonephritis.  Admission was requested. Family reports 2 choking episodes in the last 24 hours. BSE ordered   Assessment / Plan / Recommendation Clinical Impression  SLP administered clinical swallowing evaluation at bedside. Pt ws repositioned to optimal swallowing positioning and consumed thin liquids, regular textures and puree textures with no overt s/sx of aspiration noted. Pt reported that recent episodes of getting "strangled" occured while she was "not sitting up straight". SLP provided education of the importance of always being seated at 90 degrees for all PO. Pt also reports being lethargic during meals and nursing reports family feeding Pt even when she is "falling asleep". Recommend pt self feed and only when she is seated at 90 degrees and is ALERT. Recommend universal aspiration precautions be followed. No further ST needs noted at this time. ST to sign off. SLP Visit Diagnosis: Dysphagia, unspecified (R13.10)     Aspiration Risk  Mild aspiration risk    Diet Recommendation Regular;Thin liquid   Liquid Administration via: Cup;Straw Medication Administration: Whole meds with liquid Supervision: Patient able to self feed;Intermittent supervision to cue for compensatory strategies Compensations: Minimize environmental distractions;Slow rate;Small sips/bites;Follow solids with liquid;Clear throat intermittently Postural Changes: Seated upright at 90 degrees;Remain upright for at least 30 minutes after po intake    Other  Recommendations Oral Care Recommendations: Oral care BID   Follow up Recommendations None        Swallow Study   General Date of Onset: 08/21/17 HPI: 67 year old woman admitted from home on 4/20 due to abdominal pain, nausea, vomiting.  She has a history significant for bipolar disorder, COPD, hypothyroidism and chronic thrombocytopenia.  In the ED she was found to be febrile, CT scan of the abdomen shows left-sided hydronephrosis which is similar to scan from 2 years ago and acute findings concerning for pyelonephritis.  Admission was requested. Family reports 2 choking episodes in the last 24 hours. BSE ordered Type of Study: Bedside Swallow Evaluation Previous Swallow Assessment: none Diet Prior to this Study: Regular;Thin liquids Temperature Spikes Noted: No Respiratory Status: Nasal cannula History of Recent Intubation: No Behavior/Cognition: Alert;Cooperative;Pleasant mood Oral Cavity Assessment: Within Functional Limits Oral Cavity - Dentition: Dentures, top;Dentures, bottom Vision: Functional for self-feeding Self-Feeding Abilities: Able to feed self;Needs set up Patient Positioning: Upright in bed Baseline Vocal Quality: Normal Volitional Cough: Strong Volitional Swallow: Able to elicit    Oral/Motor/Sensory Function Overall Oral Motor/Sensory Function: Within functional limits   Ice Chips Ice chips: Within functional limits   Thin Liquid Thin Liquid: Within  functional limits  Nectar Thick Nectar Thick Liquid: Not tested   Honey Thick     Puree Puree: Within functional limits   Solid   Laurie Moreno H. Roddie Mc, CCC-SLP Speech Language Pathologist  Solid: Within functional limits        Wende Bushy 08/23/2017,3:18 PM

## 2017-08-23 NOTE — Clinical Social Work Note (Signed)
Patient was recently admitted 08/06/17-08/08/17. She will return to [facility] at discharge today. See full assessment below.   Clinical Social Work Assessment  Patient Details  Name: Laurie Moreno MRN: [id number] Date of Birth: Sep 27, 1950  Date of referral:  08/06/17               Reason for consult:  Discharge Planning                           Permission sought to share information with:    Permission granted to share information::                Name::                   Agency::                Relationship::                Contact Information:     Housing/Transportation Living arrangements for the past 2 months:  [place] of Information:  Facility Patient Interpreter Needed:  None Criminal Activity/Legal Involvement Pertinent to Current Situation/Hospitalization:  No - Comment as needed Significant Relationships:  Siblings Lives with:  Facility Resident Do you feel safe going back to the place where you live?  Yes Need for family participation in patient care:  Yes (Comment)  Care giving concerns: Pt resides at at ALF.   Social Worker assessment / plan: Pt is a 67 year old female admitted from [place]. Attempted to meet with pt this AM though she was gone for a test. Spoke with pt's sister briefly. Pt's sister states that pt has been at the ALF for about a year. [place] and spoke with staff who state that they assist pt with bathing and dressing. Pt is independent in other ADL's.   The ALF will need a new FL2 at dc. If pt is discharged on the weekend and is on any new medications, she will need to return to the facility with enough supply to get her through until Monday when the ALF pharmacy re-opens. Will update [facility] staff of this need. The ALF staff will transport pt at dc. Weekend CSW will be available as needed. Will follow up Monday if pt does not dc over the weekend.  Employment status:  Retired Programmer, applications PT Recommendations:  Not assessed at this time Information / Referral to community resources:     Patient/Family's Response to care: Pt and family accepting of care.  Patient/Family's Understanding of and Emotional Response to Diagnosis, Current Treatment, and Prognosis: Unable to assess pt at this time but pt's RN stated that she appears to have a good understanding of her treatment plan and no emotional distress was identified.  Emotional Assessment Appearance:  Appears stated age Attitude/Demeanor/Rapport:    Affect (typically observed):  Calm Orientation:  Oriented to Self, Oriented to Place, Oriented to  Time, Oriented to Situation Alcohol / Substance use:  Not Applicable Psych involvement (Current and /or in the community):     Discharge Needs  Concerns to be addressed:  Discharge Planning Concerns Readmission within the last 30 days:  No Current discharge risk:  None Barriers to Discharge:      Shade Flood, LCSW 08/06/2017, 11:23 AM

## 2017-08-23 NOTE — Progress Notes (Signed)
Received verbal order to Discontinue patient's Norco from Dr. Jerilee Hoh.

## 2017-08-23 NOTE — Progress Notes (Signed)
PROGRESS NOTE    Laurie Moreno  PXT:[id number] DOB: [birth year] DOA: 08/21/2017 PCP: Blythe Stanford., MD     Brief Narrative:  67 year old woman admitted from home on 4/20 due to abdominal pain, nausea, vomiting.  She has a history significant for bipolar disorder, COPD, hypothyroidism and chronic thrombocytopenia.  In the ED she was found to be febrile, CT scan of the abdomen shows left-sided hydronephrosis which is similar to scan from 2 years ago and acute findings concerning for pyelonephritis.  Admission was requested.   Assessment & Plan:   Principal Problem:   Pyelonephritis Active Problems:   Bipolar 1 disorder (HCC)   COPD (chronic obstructive pulmonary disease) (HCC)   Thrombocytopenia (HCC)   Pyelonephritis -Continue Cipro pending culture data. -Given findings of hydronephrosis that was observed on CT scan, urology was consulted by EDP who recommended outpatient follow-up given these are chronic findings. -Patient is symptomatically improved and would anticipate discharge home over the next 24 hours pending finalization of urine cx data.  Bipolar disorder -Mood is stable, continue psychotropic medications.  COPD -Stable, compensated.  Thrombocytopenia -Platelet count is stable at around 80-90,000, likely secondary to chronic liver disease.   DVT prophylaxis: SCDs Code Status: Full code Family Communication: Patient only Disposition Plan: Anticipate discharge home over next 24 hours with outpatient urology follow-up  Consultants:   None  Procedures:   None  Antimicrobials:  Anti-infectives (From admission, onward)   Start     Dose/Rate Route Frequency Ordered Stop   08/22/17 1800  ciprofloxacin (CIPRO) IVPB 400 mg     400 mg 200 mL/hr over 60 Minutes Intravenous Every 12 hours 08/22/17 1707     08/22/17 0900  meropenem (MERREM) 1 g in sodium chloride 0.9 % 100 mL IVPB  Status:  Discontinued     1 g 200 mL/hr over 30 Minutes Intravenous Every 8  hours 08/22/17 0739 08/22/17 1707   08/21/17 2130  meropenem (MERREM) 1 g in sodium chloride 0.9 % 100 mL IVPB     1 g 200 mL/hr over 30 Minutes Intravenous  Once 08/21/17 2117 08/22/17 0030   08/21/17 1915  ciprofloxacin (CIPRO) IVPB 400 mg     400 mg 200 mL/hr over 60 Minutes Intravenous  Once 08/21/17 1914 08/21/17 2113       Subjective: In bed, no acute issues.  Objective: Vitals:   08/22/17 1417 08/22/17 1442 08/23/17 0516 08/23/17 1508  BP: 122/67 121/60 125/71 118/71  Pulse: 91 91 78 84  Resp: 20 20 16 18   Temp: 97.6 F (36.4 C) 97.6 F (36.4 C) (!) 97.4 F (36.3 C) 98.4 F (36.9 C)  TempSrc:   Oral Oral  SpO2: 92% 99% 93% 90%  Weight:      Height:        Intake/Output Summary (Last 24 hours) at 08/23/2017 1516 Last data filed at 08/23/2017 0838 Gross per 24 hour  Intake 1140 ml  Output 1850 ml  Net -710 ml   Filed Weights   08/21/17 1643 08/21/17 2147  Weight: 81.6 kg (180 lb) 87.8 kg (193 lb 9 oz)    Examination:  General exam: Alert, awake, oriented x 3, morbidly obese. Respiratory system: Clear to auscultation. Respiratory effort normal. Cardiovascular system:RRR. No murmurs, rubs, gallops. Gastrointestinal system: Abdomen is nondistended, soft and nontender. No organomegaly or masses felt. Normal bowel sounds heard. Central nervous system: Alert and oriented. No focal neurological deficits. Extremities: No C/C/E, +pedal pulses Skin: No rashes, lesions or ulcers Psychiatry: Judgement and insight  appear normal. Mood & affect appropriate.      Data Reviewed: I have personally reviewed following labs and imaging studies  CBC: Recent Labs  Lab 08/21/17 1713 08/22/17 0616 08/23/17 0548  WBC 8.6 8.2 6.5  NEUTROABS 5.2 5.1  --   HGB 13.7 13.1 12.0  HCT 42.0 41.9 37.5  MCV 95.0 97.7 96.9  PLT 92* 82* 92*   Basic Metabolic Panel: Recent Labs  Lab 08/21/17 1713 08/22/17 0616 08/23/17 0548  NA 141 140 140  K 4.4 4.7 4.7  CL 103 105 103    CO2 25 25 28   GLUCOSE 182* 136* 150*  BUN 16 13 14   CREATININE 0.78 0.71 0.75  CALCIUM 9.0 8.3* 8.7*   GFR: Estimated Creatinine Clearance: 72.7 mL/min (by C-G formula based on SCr of 0.75 mg/dL). Liver Function Tests: Recent Labs  Lab 08/21/17 1713 08/22/17 0616  AST 17 12*  ALT 11* 11*  ALKPHOS 45 39  BILITOT 2.1* 2.2*  PROT 7.1 6.8  ALBUMIN 3.9 3.5   Recent Labs  Lab 08/21/17 1713  LIPASE 48   No results for input(s): AMMONIA in the last 168 hours. Coagulation Profile: No results for input(s): INR, PROTIME in the last 168 hours. Cardiac Enzymes: Recent Labs  Lab 08/21/17 1713  TROPONINI <0.03   BNP (last 3 results) No results for input(s): PROBNP in the last 8760 hours. HbA1C: No results for input(s): HGBA1C in the last 72 hours. CBG: No results for input(s): GLUCAP in the last 168 hours. Lipid Profile: No results for input(s): CHOL, HDL, LDLCALC, TRIG, CHOLHDL, LDLDIRECT in the last 72 hours. Thyroid Function Tests: No results for input(s): TSH, T4TOTAL, FREET4, T3FREE, THYROIDAB in the last 72 hours. Anemia Panel: No results for input(s): VITAMINB12, FOLATE, FERRITIN, TIBC, IRON, RETICCTPCT in the last 72 hours. Urine analysis:    Component Value Date/Time   COLORURINE YELLOW 08/21/2017 1759   APPEARANCEUR HAZY (A) 08/21/2017 1759   LABSPEC 1.013 08/21/2017 1759   PHURINE 6.0 08/21/2017 1759   GLUCOSEU NEGATIVE 08/21/2017 1759   HGBUR LARGE (A) 08/21/2017 1759   BILIRUBINUR NEGATIVE 08/21/2017 1759   KETONESUR 5 (A) 08/21/2017 1759   PROTEINUR 100 (A) 08/21/2017 1759   NITRITE NEGATIVE 08/21/2017 1759   LEUKOCYTESUR MODERATE (A) 08/21/2017 1759   Sepsis Labs: @LABRCNTIP (procalcitonin:4,lacticidven:4)  ) Recent Results (from the past 240 hour(s))  Culture, blood (routine x 2)     Status: None (Preliminary result)   Collection Time: 08/21/17  5:13 PM  Result Value Ref Range Status   Specimen Description BLOOD RIGHT ARM  Final   Special  Requests   Final    BOTTLES DRAWN AEROBIC AND ANAEROBIC Blood Culture adequate volume   Culture   Final    NO GROWTH 2 DAYS Performed at [facility], [address], [place], [place] [address]    Report Status PENDING  Incomplete  Culture, blood (routine x 2)     Status: None (Preliminary result)   Collection Time: 08/21/17  5:15 PM  Result Value Ref Range Status   Specimen Description BLOOD RIGHT HAND  Final   Special Requests   Final    BOTTLES DRAWN AEROBIC AND ANAEROBIC Blood Culture adequate volume   Culture   Final    NO GROWTH 2 DAYS Performed at [facility], [address] [place], [place] [address]    Report Status PENDING  Incomplete  Urine culture     Status: Abnormal (Preliminary result)   Collection Time: 08/21/17  5:59 PM  Result Value Ref Range Status   Specimen Description   Final    URINE, CLEAN CATCH Performed at [facility], [address], [place], [place] [address]    Special Requests   Final    NONE Performed at [facility], [address], [place], [place] [address]    Culture (A)  Final    >=100,000 COLONIES/mL ESCHERICHIA COLI REPEATING SENSITIVITIES Performed at [facility], [place] [address] [place], [place] [address]    Report Status PENDING  Incomplete         Radiology Studies: Dg Chest 2 View  Result Date: 08/21/2017 CLINICAL DATA:  Fever, cough, diarrhea, and abdominal pain. EXAM: CHEST - 2 VIEW COMPARISON:  08/05/2017 FINDINGS: Heart is enlarged. Patient is slightly rotated towards the LEFT. There are no focal consolidations or pleural effusions. No evidence for pulmonary edema. IMPRESSION: Stable cardiomegaly. Electronically Signed   By: Nolon Nations M.D.   On: 08/21/2017 18:59   Ct Abdomen Pelvis W Contrast  Result Date: 08/21/2017 CLINICAL DATA:  Abdominal pain EXAM: CT ABDOMEN AND PELVIS WITH CONTRAST TECHNIQUE: Multidetector CT imaging of the abdomen and pelvis was performed using the standard protocol following bolus  administration of intravenous contrast. CONTRAST:  164mL ISOVUE-300 IOPAMIDOL (ISOVUE-300) INJECTION 61% COMPARISON:  08/12/2015 FINDINGS: Lower chest: Lung bases are well aerated with stable right lower lobe nodule when compared with the prior exam. Hepatobiliary: Liver demonstrates some nodularity consistent with mild underlying cirrhosis. No focal mass is seen. Gallbladder is within normal limits. Pancreas: Unremarkable. No pancreatic ductal dilatation or surrounding inflammatory changes. Spleen: Normal in size without focal abnormality. Adrenals/Urinary Tract: Adrenal glands are within normal limits. Kidneys are well visualized bilaterally. The right kidney demonstrates no renal calculi or obstructive change. The left kidney demonstrates considerable perinephric stranding as well as hydronephrosis without evidence of significant hydroureter. This is consistent with a ureteropelvic junction obstruction and is similar to that seen on the prior examination. The perinephric changes likely represent a component of pyelonephritis. Some mottled enhancement is noted in the upper pole on the left. Some slight increased attenuation in the distal ureter on the left is noted. Again no stone is identified. Stomach/Bowel: The appendix is not well visualized. No obstructive or inflammatory changes are seen. Vascular/Lymphatic: No significant vascular findings are present. No enlarged abdominal or pelvic lymph nodes. Reproductive: Scattered small fibroids are noted. Other: No abdominal wall hernia or abnormality. No abdominopelvic ascites. Musculoskeletal: Degenerative changes of lumbar spine are noted. IMPRESSION: Changes consistent with left UPJ obstruction. Associated inflammatory changes and mottled enhancement in the left kidney are noted suggestive of pyelonephritis. Mild cirrhotic change Uterine fibroids. Electronically Signed   By: Inez Catalina M.D.   On: 08/21/2017 19:08        Scheduled Meds: . aspirin  81 mg  Oral Daily  . atorvastatin  40 mg Oral q1800  . carvedilol  3.125 mg Oral BID WC  . clonazePAM  0.5 mg Oral BID  . divalproex  250 mg Oral TID  . escitalopram  10 mg Oral Daily  . gabapentin  300 mg Oral QID  . levothyroxine  100 mcg Oral QAC breakfast  . primidone  50 mg Oral Q2000   Continuous Infusions: . ciprofloxacin Stopped (08/23/17 0648)     LOS: 2 days    Time spent: 25 minutes.      Lelon Frohlich, MD [facility] Pager [phone]  If 7PM-7AM, please contact night-coverage [email or web address] Password [facility] 08/23/2017, 3:16 PM

## 2017-08-24 LAB — URINE CULTURE: Culture: >=100000 — AB

## 2017-08-24 MED ORDER — SODIUM CHLORIDE 0.9 % IV SOLN
1.0000 g | Freq: Three times a day (TID) | INTRAVENOUS | Status: DC
  Administered 2017-08-24 – 2017-08-25 (×4): 1 g via INTRAVENOUS
  Filled 2017-08-24 (×7): qty 1

## 2017-08-24 MED ORDER — SODIUM CHLORIDE 0.9 % IV SOLN: 1.0000 g | Freq: Three times a day (TID) | INTRAVENOUS | Status: AC

## 2017-08-24 MED ORDER — SODIUM CHLORIDE 0.9% FLUSH: 10.0000 mL | INTRAVENOUS | Status: DC | PRN

## 2017-08-24 MED ORDER — SODIUM CHLORIDE 0.9% FLUSH
10.0000 mL | Freq: Two times a day (BID) | INTRAVENOUS | Status: DC
  Administered 2017-08-24 – 2017-08-25 (×3): 10 mL

## 2017-08-24 NOTE — Evaluation (Signed)
Physical Therapy Evaluation Patient Details Name: Laurie Moreno MRN: [id number] DOB: 07/15/1950 Today's Date: 08/24/2017   History of Present Illness  Laurie Moreno is a 67 y.o. female with medical history significant for bipolar disorder, COPD, hypothyroidism, and chronic thrombocytopenia, now presenting to the emergency department from her SNF for evaluation of abdominal pain with nausea and vomiting.  Patient reports that symptoms developed yesterday.  She has not felt febrile but has documented fever.  She denies chest pain or shortness of breath, but reports residual cough from recent pneumonia.    Clinical Impression  Patient slightly impulsive requiring frequent verbal cues for safety, had difficulty with sit to stands from commode in bathroom with near loss of balance due to BLE weakness, very shaky/trembly during gait training, limited mostly due to c/o fatigue, on room air with O2 saturation between 90-92 % after sitting in chair, patient put back on 2 LPM O2 and tolerated sitting up in chair to eat lunch after therapy.  Patient will benefit from continued physical therapy in hospital and recommended venue below to increase strength, balance, endurance for safe ADLs and gait.    Follow Up Recommendations SNF;Supervision/Assistance - 24 hour    Equipment Recommendations  None recommended by PT    Recommendations for Other Services       Precautions / Restrictions Precautions Precautions: Fall Restrictions Weight Bearing Restrictions: No      Mobility  Bed Mobility Overal bed mobility: Needs Assistance Bed Mobility: Supine to Sit     Supine to sit: Supervision        Transfers Overall transfer level: Needs assistance Equipment used: Rolling walker (2 wheeled) Transfers: Sit to/from [facility] Sit to Stand: Min assist Stand pivot transfers: Min assist          Ambulation/Gait Ambulation/Gait assistance: Min assist Ambulation Distance (Feet): 45  Feet Assistive device: Rolling walker (2 wheeled) Gait Pattern/deviations: Decreased step length - right;Decreased step length - left;Decreased stride length Gait velocity: decreased   General Gait Details: demonstrates slightly unsteady shaky cadence without loss of balance, limited secondary to fatigue while on room air with O2 saturation between 89-92%  Stairs            Wheelchair Mobility    Modified Rankin (Stroke Patients Only)       Balance Overall balance assessment: Needs assistance Sitting-balance support: Feet supported;No upper extremity supported Sitting balance-Leahy Scale: Good     Standing balance support: During functional activity;No upper extremity supported Standing balance-Leahy Scale: Poor Standing balance comment: fair using RW                             Pertinent Vitals/Pain Pain Assessment: 0-10 Pain Score: 5  Pain Location: stomach Pain Descriptors / Indicators: Aching Pain Intervention(s): Limited activity within patient's tolerance;Monitored during session    Home Living Family/patient expects to be discharged to:: Assisted living               Home Equipment: Walker - 2 wheels      Prior Function Level of Independence: Needs assistance   Gait / Transfers Assistance Needed: Supervised household gait with RW  ADL's / Homemaking Assistance Needed: assisted by ALF staff        Hand Dominance        Extremity/Trunk Assessment   Upper Extremity Assessment Upper Extremity Assessment: Generalized weakness    Lower Extremity Assessment Lower Extremity Assessment: Generalized weakness    Cervical /  Trunk Assessment Cervical / Trunk Assessment: Normal  Communication   Communication: No difficulties  Cognition Arousal/Alertness: Awake/alert Behavior During Therapy: WFL for tasks assessed/performed Overall Cognitive Status: Within Functional Limits for tasks assessed                                         General Comments      Exercises     Assessment/Plan    PT Assessment Patient needs continued PT services  PT Problem List Decreased strength;Decreased activity tolerance;Decreased balance;Decreased mobility       PT Treatment Interventions Gait training;Functional mobility training;Therapeutic activities;Therapeutic exercise;Stair training;Patient/family education    PT Goals (Current goals can be found in the Care Plan section)  Acute Rehab PT Goals Patient Stated Goal: return to ALF when able to walk better PT Goal Formulation: With patient Time For Goal Achievement: 09/07/17 Potential to Achieve Goals: Good    Frequency Min 3X/week   Barriers to discharge        Co-evaluation               AM-PAC PT "6 Clicks" Daily Activity  Outcome Measure Difficulty turning over in bed (including adjusting bedclothes, sheets and blankets)?: None Difficulty moving from lying on back to sitting on the side of the bed? : None Difficulty sitting down on and standing up from a chair with arms (e.g., wheelchair, bedside commode, etc,.)?: A Little Help needed moving to and from a bed to chair (including a wheelchair)?: A Little Help needed walking in hospital room?: A Little Help needed climbing 3-5 steps with a railing? : A Lot 6 Click Score: 19    End of Session Equipment Utilized During Treatment: Gait belt Activity Tolerance: Patient tolerated treatment well;Patient limited by fatigue Patient left: in chair;with call bell/phone within reach;with chair alarm set Nurse Communication: Mobility status PT Visit Diagnosis: Unsteadiness on feet (R26.81);Other abnormalities of gait and mobility (R26.89);Muscle weakness (generalized) (M62.81)    Time: 0737-1062 PT Time Calculation (min) (ACUTE ONLY): 37 min   Charges:   PT Evaluation $PT Eval Moderate Complexity: 1 Mod PT Treatments $Therapeutic Activity: 23-37 mins   PT G Codes:        12:21 PM,  August 25, 2017 Lonell Grandchild, MPT Physical Therapist with [facility] 336 [phone] office [phone] mobile phone

## 2017-08-24 NOTE — Discharge Summary (Signed)
Physician Discharge Summary  Laurie Moreno UDJ:[id number] DOB: 13-Aug-1950 DOA: 08/21/2017  PCP: Blythe Stanford., MD  Admit date: 08/21/2017 Discharge date: 08/24/2017  Time spent: 45 minutes  Recommendations for Outpatient Follow-up:  -To be discharged to skilled nursing facility today to complete 7 days of meropenem for ESBL UTI. -Please arrange outpatient follow-up with urology for evaluation of chronic left hydronephrosis.  Discharge Diagnoses:  Principal Problem:   Pyelonephritis Active Problems:   Bipolar 1 disorder ([place])   COPD (chronic obstructive pulmonary disease) (HCC)   Thrombocytopenia ([place])   Discharge Condition: Stable and improved  Filed Weights   08/21/17 1643 08/21/17 2147  Weight: 81.6 kg (180 lb) 87.8 kg (193 lb 9 oz)    History of present illness:  As per Dr. Myna Hidalgo on 4/20: Laurie Moreno is a 67 y.o. female with medical history significant for bipolar disorder, COPD, hypothyroidism, and chronic thrombocytopenia, now presenting to the emergency department from her SNF for evaluation of abdominal pain with nausea and vomiting.  Patient reports that symptoms developed yesterday.  She has not felt febrile but has documented fever.  She denies chest pain or shortness of breath, but reports residual cough from recent pneumonia.  ED Course: Upon arrival to the ED, patient is found to be febrile to 39.1 C, saturating low 90s on room air, and vitals otherwise normal.  EKG features a sinus rhythm with LAFB.  Chest x-ray is negative for acute cardiopulmonary disease.  Chemistry panel features a mild hyperbilirubinemia and hyperglycemia.  CBC is notable for a chronic stable thrombocytopenia with platelets 92,000.  Lactic acid is reassuringly normal.  CT of the abdomen and pelvis reveals a left-sided hydronephrosis, similar to scan from 2 years ago, with new findings to suggest left pyelonephritis.  Urinalysis is consistent with infection.  Blood and urine cultures were  collected and the patient was treated with empiric ciprofloxacin in the ED.  She remains hemodynamically stable, in no apparent respiratory distress, and will be admitted to the medical-surgical unit for ongoing evaluation and management of pyelonephritis.    Hospital Course:   Pyelonephritis -Urine culture has unfortunately grown ESBL E. coli. -We will need to transition antibiotics over to meropenem which she will need to continue for 7 days.  Midline has been placed for this to be administered at SNF. -Given findings of hydronephrosis that was observed on CT scan, urology was consulted by EDP who recommended outpatient follow-up given these are chronic findings.  Bipolar disorder -Mood is stable, continue psychotropic medications.  COPD -Stable, compensated.  Thrombocytopenia -Platelet count is stable at around 80-90,000, likely secondary to chronic liver disease.    Procedures:  None   Consultations:  None  Discharge Instructions  Discharge Instructions    Diet - low sodium heart healthy   Complete by:  As directed    Increase activity slowly   Complete by:  As directed      Allergies as of 08/24/2017      Reactions   Penicillins Hives, Rash   Not effective per patient Pt does not know. She states it was when she was a child. unsure      Medication List    STOP taking these medications   hydrOXYzine 25 MG tablet Commonly known as:  ATARAX/VISTARIL     TAKE these medications   albuterol 108 (90 Base) MCG/ACT inhaler Commonly known as:  PROVENTIL HFA;VENTOLIN HFA Inhale into the lungs every 6 (six) hours as needed for wheezing or shortness of breath.  aspirin 81 MG chewable tablet Chew 81 mg by mouth daily.   atorvastatin 40 MG tablet Commonly known as:  LIPITOR Take 40 mg by mouth daily.   carvedilol 3.125 MG tablet Commonly known as:  COREG Take 3.125 mg by mouth 2 (two) times daily with a meal.   clonazePAM 0.5 MG tablet Commonly known  as:  KLONOPIN Take 0.5 mg by mouth 2 (two) times daily.   divalproex 250 MG DR tablet Commonly known as:  DEPAKOTE Take 250 mg by mouth 3 (three) times daily.   docusate sodium 100 MG capsule Commonly known as:  COLACE Take 100 mg by mouth as needed for mild constipation.   escitalopram 10 MG tablet Commonly known as:  LEXAPRO Take 10 mg by mouth daily.   gabapentin 300 MG capsule Commonly known as:  NEURONTIN Take 300 mg by mouth 4 (four) times daily.   ipratropium-albuterol 0.5-2.5 (3) MG/3ML Soln Commonly known as:  DUONEB Take 3 mLs by nebulization every 6 (six) hours as needed.   levothyroxine 100 MCG tablet Commonly known as:  SYNTHROID, LEVOTHROID Take 100 mcg by mouth daily before breakfast.   loratadine 10 MG tablet Commonly known as:  CLARITIN Take 10 mg by mouth daily.   meropenem 1 g in sodium chloride 0.9 % 100 mL Inject 1 g into the vein every 8 (eight) hours for 7 days.   multivitamin tablet Take 1 tablet by mouth daily.   primidone 50 MG tablet Commonly known as:  MYSOLINE Take 50 mg by mouth daily at 8 pm.   zinc oxide 20 % ointment Apply 1 application topically as needed for irritation.      Allergies  Allergen Reactions  . Penicillins Hives and Rash    Not effective per patient Pt does not know. She states it was when she was a child. unsure    Contact information for after-discharge care    [facility] .   Service:  Skilled Nursing Contact information: [address] [place] [phone]               The results of significant diagnostics from this hospitalization (including imaging, microbiology, ancillary and laboratory) are listed below for reference.    Significant Diagnostic Studies: Dg Chest 1 View  Result Date: 08/05/2017 CLINICAL DATA:  Confusion EXAM: CHEST  1 VIEW COMPARISON:  Report 04/04/2015 FINDINGS: Scoliosis of the spine. Patchy atelectasis or  minimal infiltrate at the left base. No large effusion. Mild cardiomegaly. No pneumothorax. IMPRESSION: Patchy atelectasis or minimal infiltrate at the left base. Mild cardiomegaly. Electronically Signed   By: Donavan Foil M.D.   On: 08/05/2017 23:49   Dg Chest 2 View  Result Date: 08/21/2017 CLINICAL DATA:  Fever, cough, diarrhea, and abdominal pain. EXAM: CHEST - 2 VIEW COMPARISON:  08/05/2017 FINDINGS: Heart is enlarged. Patient is slightly rotated towards the LEFT. There are no focal consolidations or pleural effusions. No evidence for pulmonary edema. IMPRESSION: Stable cardiomegaly. Electronically Signed   By: Nolon Nations M.D.   On: 08/21/2017 18:59   Ct Head Wo Contrast  Result Date: 08/05/2017 CLINICAL DATA:  Altered LOC EXAM: CT HEAD WITHOUT CONTRAST TECHNIQUE: Contiguous axial images were obtained from the base of the skull through the vertex without intravenous contrast. COMPARISON:  None. FINDINGS: Brain: No acute territorial infarction, hemorrhage or intracranial mass is visualized. Mild atrophy. Normal ventricle size. Vascular: No hyperdense vessel or unexpected calcification. Skull: Normal. Negative for  fracture or focal lesion. Sinuses/Orbits: Mucosal thickening in the sphenoid sinus. No acute orbital abnormality Other: None IMPRESSION: No CT evidence for acute intracranial abnormality.  Mild atrophy Electronically Signed   By: Donavan Foil M.D.   On: 08/05/2017 23:49   Mr Brain Wo Contrast  Result Date: 08/06/2017 CLINICAL DATA:  Altered level of consciousness, unexplained. EXAM: MRI HEAD WITHOUT CONTRAST TECHNIQUE: Multiplanar, multiecho pulse sequences of the brain and surrounding structures were obtained without intravenous contrast. COMPARISON:  Head CT from yesterday FINDINGS: Brain: No acute infarction, hemorrhage, hydrocephalus, extra-axial collection or mass lesion. Mild periventricular FLAIR hyperintensity best attributed to chronic small vessel ischemia. Age normal brain  volume. Vascular: Diffusely preserved flow voids. Tortuous intracranial vessels, no chart history of hypertension. Skull and upper cervical spine: No evidence of marrow lesion. Sinuses/Orbits: Bilateral cataract resection. Chronic right sphenoid sinusitis with sclerotic wall thickening by preceding CT. IMPRESSION: No acute finding or explanation for symptoms. Electronically Signed   By: Monte Fantasia M.D.   On: 08/06/2017 11:07   Ct Abdomen Pelvis W Contrast  Result Date: 08/21/2017 CLINICAL DATA:  Abdominal pain EXAM: CT ABDOMEN AND PELVIS WITH CONTRAST TECHNIQUE: Multidetector CT imaging of the abdomen and pelvis was performed using the standard protocol following bolus administration of intravenous contrast. CONTRAST:  135mL ISOVUE-300 IOPAMIDOL (ISOVUE-300) INJECTION 61% COMPARISON:  08/12/2015 FINDINGS: Lower chest: Lung bases are well aerated with stable right lower lobe nodule when compared with the prior exam. Hepatobiliary: Liver demonstrates some nodularity consistent with mild underlying cirrhosis. No focal mass is seen. Gallbladder is within normal limits. Pancreas: Unremarkable. No pancreatic ductal dilatation or surrounding inflammatory changes. Spleen: Normal in size without focal abnormality. Adrenals/Urinary Tract: Adrenal glands are within normal limits. Kidneys are well visualized bilaterally. The right kidney demonstrates no renal calculi or obstructive change. The left kidney demonstrates considerable perinephric stranding as well as hydronephrosis without evidence of significant hydroureter. This is consistent with a ureteropelvic junction obstruction and is similar to that seen on the prior examination. The perinephric changes likely represent a component of pyelonephritis. Some mottled enhancement is noted in the upper pole on the left. Some slight increased attenuation in the distal ureter on the left is noted. Again no stone is identified. Stomach/Bowel: The appendix is not well  visualized. No obstructive or inflammatory changes are seen. Vascular/Lymphatic: No significant vascular findings are present. No enlarged abdominal or pelvic lymph nodes. Reproductive: Scattered small fibroids are noted. Other: No abdominal wall hernia or abnormality. No abdominopelvic ascites. Musculoskeletal: Degenerative changes of lumbar spine are noted. IMPRESSION: Changes consistent with left UPJ obstruction. Associated inflammatory changes and mottled enhancement in the left kidney are noted suggestive of pyelonephritis. Mild cirrhotic change Uterine fibroids. Electronically Signed   By: Inez Catalina M.D.   On: 08/21/2017 19:08    Microbiology: Recent Results (from the past 240 hour(s))  Culture, blood (routine x 2)     Status: None (Preliminary result)   Collection Time: 08/21/17  5:13 PM  Result Value Ref Range Status   Specimen Description BLOOD RIGHT ARM  Final   Special Requests   Final    BOTTLES DRAWN AEROBIC AND ANAEROBIC Blood Culture adequate volume   Culture   Final    NO GROWTH 3 DAYS Performed at [facility], [address], [place], [place] [address]    Report Status PENDING  Incomplete  Culture, blood (routine x 2)     Status: None (Preliminary result)   Collection Time: 08/21/17  5:15 PM  Result  Value Ref Range Status   Specimen Description BLOOD RIGHT HAND  Final   Special Requests   Final    BOTTLES DRAWN AEROBIC AND ANAEROBIC Blood Culture adequate volume   Culture   Final    NO GROWTH 3 DAYS Performed at [facility], [address], [place], [place] [address]    Report Status PENDING  Incomplete  Urine culture     Status: Abnormal   Collection Time: 08/21/17  5:59 PM  Result Value Ref Range Status   Specimen Description   Final    URINE, CLEAN CATCH Performed at [facility], [address], [place], [place] [address]    Special Requests   Final    NONE Performed at [facility], [address], [place], [place] [address]    Culture >=100,000  COLONIES/mL ESCHERICHIA COLI (A)  Final   Report Status 08/24/2017 FINAL  Final   Organism ID, Bacteria ESCHERICHIA COLI (A)  Final      Susceptibility   Escherichia coli - MIC*    AMPICILLIN >=32 RESISTANT Resistant     CEFAZOLIN >=64 RESISTANT Resistant     CEFTRIAXONE >=64 RESISTANT Resistant     CIPROFLOXACIN >=4 RESISTANT Resistant     GENTAMICIN <=1 SENSITIVE Sensitive     IMIPENEM <=0.25 SENSITIVE Sensitive     NITROFURANTOIN <=16 SENSITIVE Sensitive     TRIMETH/SULFA >=320 RESISTANT Resistant     AMPICILLIN/SULBACTAM >=32 RESISTANT Resistant     PIP/TAZO <=4 SENSITIVE Sensitive     * >=100,000 COLONIES/mL ESCHERICHIA COLI     Labs: Basic Metabolic Panel: Recent Labs  Lab 08/21/17 1713 08/22/17 0616 08/23/17 0548  NA 141 140 140  K 4.4 4.7 4.7  CL 103 105 103  CO2 25 25 28   GLUCOSE 182* 136* 150*  BUN 16 13 14   CREATININE 0.78 0.71 0.75  CALCIUM 9.0 8.3* 8.7*   Liver Function Tests: Recent Labs  Lab 08/21/17 1713 08/22/17 0616  AST 17 12*  ALT 11* 11*  ALKPHOS 45 39  BILITOT 2.1* 2.2*  PROT 7.1 6.8  ALBUMIN 3.9 3.5   Recent Labs  Lab 08/21/17 1713  LIPASE 48   No results for input(s): AMMONIA in the last 168 hours. CBC: Recent Labs  Lab 08/21/17 1713 08/22/17 0616 08/23/17 0548  WBC 8.6 8.2 6.5  NEUTROABS 5.2 5.1  --   HGB 13.7 13.1 12.0  HCT 42.0 41.9 37.5  MCV 95.0 97.7 96.9  PLT 92* 82* 92*   Cardiac Enzymes: Recent Labs  Lab 08/21/17 1713  TROPONINI <0.03   BNP: BNP (last 3 results) No results for input(s): BNP in the last 8760 hours.  ProBNP (last 3 results) No results for input(s): PROBNP in the last 8760 hours.  CBG: No results for input(s): GLUCAP in the last 168 hours.     Signed:  Lelon Frohlich  [facility] Pager: [phone] 08/24/2017, 2:00 PM

## 2017-08-24 NOTE — Care Management Important Message (Signed)
Important Message  Patient Details  Name: Laurie Moreno MRN: [id number] Date of Birth: [birth year]   Medicare Important Message Given:  Yes    Kendrea Cerritos, Chauncey Reading, RN 08/24/2017, 2:22 PM

## 2017-08-24 NOTE — NC FL2 (Signed)
[place] LEVEL OF CARE SCREENING TOOL     IDENTIFICATION  Patient Name: Laurie Moreno Birthdate: 1950/05/19 Sex: female Admission Date (Current Location): 08/21/2017  [place] and [place] Number:  [place] [id number] [place] and Address:  [facility] [address] [place]      Provider Number: [id number]  Attending Physician Name and Address:  Isaac Bliss, [place]  Relative Name and Phone Number:  Fabio Bering (sister) [phone]    Current Level of Care: Hospital Recommended Level of Care: [place] Prior Approval Number:    Date Approved/Denied:   PASRR Number: pending  Discharge Plan: SNF    Current Diagnoses: Patient Active Problem List   Diagnosis Date Noted  . Pyelonephritis 08/21/2017  . COPD (chronic obstructive pulmonary disease) ([place]) 08/21/2017  . Thrombocytopenia ([place]) 08/21/2017  . Ureteropelvic junction (UPJ) obstruction, left   . HCAP (healthcare-associated pneumonia) 08/07/2017  . Bipolar 1 disorder ([place]) 08/07/2017  . Essential tremor 08/07/2017  . Acute on chronic respiratory failure with hypoxia ([place]) 08/07/2017  . Altered mental status 08/06/2017    Orientation RESPIRATION BLADDER Height & Weight     Self, Situation, Place    Continent Weight: 193 lb 9 oz (87.8 kg) Height:  5\' 3"  (160 cm)  BEHAVIORAL SYMPTOMS/MOOD NEUROLOGICAL BOWEL NUTRITION STATUS      Continent Diet(See Discharge Summary)  AMBULATORY STATUS COMMUNICATION OF NEEDS Skin   Limited Assist Verbally Normal                       Personal Care Assistance Level of Assistance  Bathing, Feeding, Dressing Bathing Assistance: Limited assistance Feeding assistance: Independent Dressing Assistance: Limited assistance     Functional Limitations Info  Sight, Hearing, Speech Sight Info: Adequate Hearing Info: Adequate Speech Info: Adequate    SPECIAL CARE FACTORS FREQUENCY  PT (By licensed PT)     PT  Frequency: 5 times week              Contractures Contractures Info: Not present    Additional Factors Info  Code Status, Allergies, Psychotropic Code Status Info: Full code Allergies Info: Codeine, Penicillins Psychotropic Info: Lexapro, Depakote, Klonopin         Current Medications (08/24/2017):  This is the current hospital active medication list Current Facility-Administered Medications  Medication Dose Route Frequency Provider Last Rate Last Dose  . acetaminophen (TYLENOL) tablet 650 mg  650 mg Oral Q6H PRN Opyd, Ilene Qua, MD   650 mg at 08/23/17 1723   Or  . acetaminophen (TYLENOL) suppository 650 mg  650 mg Rectal Q6H PRN Opyd, Ilene Qua, MD      . aspirin chewable tablet 81 mg  81 mg Oral Daily Opyd, Ilene Qua, MD   81 mg at 08/24/17 1029  . atorvastatin (LIPITOR) tablet 40 mg  40 mg Oral q1800 Opyd, Ilene Qua, MD   40 mg at 08/23/17 1724  . bisacodyl (DULCOLAX) EC tablet 5 mg  5 mg Oral Daily PRN Opyd, Ilene Qua, MD      . carvedilol (COREG) tablet 3.125 mg  3.125 mg Oral BID WC Opyd, Ilene Qua, MD   3.125 mg at 08/24/17 0804  . clonazePAM (KLONOPIN) tablet 0.5 mg  0.5 mg Oral BID Opyd, Ilene Qua, MD   0.5 mg at 08/24/17 1030  . divalproex (DEPAKOTE) DR tablet 250 mg  250 mg Oral TID Vianne Bulls, MD   250 mg at 08/24/17 1029  . escitalopram (LEXAPRO)  tablet 10 mg  10 mg Oral Daily Opyd, Ilene Qua, MD   10 mg at 08/24/17 1029  . gabapentin (NEURONTIN) capsule 300 mg  300 mg Oral QID Opyd, Ilene Qua, MD   300 mg at 08/24/17 1029  . hydrOXYzine (ATARAX/VISTARIL) tablet 25 mg  25 mg Oral Q6H PRN Opyd, Ilene Qua, MD      . ipratropium-albuterol (DUONEB) 0.5-2.5 (3) MG/3ML nebulizer solution 3 mL  3 mL Nebulization Q6H PRN Opyd, Ilene Qua, MD      . levothyroxine (SYNTHROID, LEVOTHROID) tablet 100 mcg  100 mcg Oral QAC breakfast Opyd, Ilene Qua, MD   100 mcg at 08/24/17 0803  . meropenem (MERREM) 1 g in sodium chloride 0.9 % 100 mL IVPB  1 g Intravenous Q8H Isaac Bliss, Rayford Halsted, MD      . ondansetron [facility]) tablet 4 mg  4 mg Oral Q6H PRN Opyd, Ilene Qua, MD       Or  . ondansetron (ZOFRAN) injection 4 mg  4 mg Intravenous Q6H PRN Opyd, Ilene Qua, MD      . primidone (MYSOLINE) tablet 50 mg  50 mg Oral Q2000 Opyd, Ilene Qua, MD   50 mg at 08/23/17 2108  . senna-docusate (Senokot-S) tablet 1 tablet  1 tablet Oral QHS PRN Opyd, Ilene Qua, MD         Discharge Medications: Please see discharge summary for a list of discharge medications.  Relevant Imaging Results:  Relevant Lab Results:   Additional Information SSN: [place]  Shade Flood, LCSW

## 2017-08-24 NOTE — Plan of Care (Signed)
  Problem: Acute Rehab PT Goals(only PT should resolve) Goal: Pt Will Go Supine/Side To Sit Outcome: Progressing Flowsheets (Taken 08/24/2017 1223) Pt will go Supine/Side to Sit: with modified independence Goal: Patient Will Transfer Sit To/From Stand Outcome: Progressing Flowsheets (Taken 08/24/2017 1223) Patient will transfer sit to/from stand: with min guard assist Goal: Pt Will Transfer Bed To Chair/Chair To Bed Outcome: Progressing Flowsheets (Taken 08/24/2017 1223) Pt will Transfer Bed to Chair/Chair to Bed: min guard assist Goal: Pt Will Ambulate Outcome: Progressing Flowsheets (Taken 08/24/2017 1223) Pt will Ambulate: with min guard assist;75 feet;with rolling walker  12:23 PM, 08/24/17 Lonell Grandchild, MPT Physical Therapist with [facility] [phone] office [phone] mobile phone

## 2017-08-25 ENCOUNTER — Inpatient Hospital Stay (HOSPITAL_COMMUNITY): Payer: Medicare Other

## 2017-08-25 ENCOUNTER — Inpatient Hospital Stay
Admission: RE | Admit: 2017-08-25 | Discharge: 2017-09-07 | Disposition: A | Discharge status: Other Acute Inpatient Hospital | Payer: Medicare Other | Source: Ambulatory Visit | Attending: Internal Medicine | Admitting: Internal Medicine

## 2017-08-25 DIAGNOSIS — B9629 Other Escherichia coli [E. coli] as the cause of diseases classified elsewhere: Secondary | ICD-10-CM | POA: Diagnosis present

## 2017-08-25 DIAGNOSIS — N390 Urinary tract infection, site not specified: Secondary | ICD-10-CM

## 2017-08-25 DIAGNOSIS — Z1612 Extended spectrum beta lactamase (ESBL) resistance: Secondary | ICD-10-CM

## 2017-08-25 MED ORDER — SODIUM CHLORIDE 0.9% FLUSH: 10.0000 mL | Freq: Two times a day (BID) | INTRAVENOUS | Status: DC

## 2017-08-25 MED ORDER — SODIUM CHLORIDE 0.9% FLUSH: 10.0000 mL | INTRAVENOUS | Status: DC | PRN

## 2017-08-25 NOTE — Discharge Summary (Signed)
Physician Discharge Summary  Laurie Moreno GUR:[id number] DOB: 1951-04-02 DOA: 08/21/2017  PCP: Blythe Stanford., MD  Admit date: 08/21/2017 Discharge date: 08/25/2017  Admitted From: Home Disposition: Skilled nursing facility [facility])  Recommendations for Outpatient Follow-up:  1. Follow-up with MD at SNF within 1 week.  Patient will complete 10-day course of antibiotic with meropenem on 4/30.  Please discontinue PICC line after completion of antibiotics.  Home Health: None Equipment/Devices: As per therapy at the facility  Discharge Condition: Fair CODE STATUS: Full code Diet recommendation: Regular    Discharge Diagnoses:  Principal Problem:   Urinary tract infection due to extended-spectrum beta lactamase (ESBL) producing Escherichia coli  Active Problems:   Bipolar 1 disorder (HCC)   Pyelonephritis   COPD (chronic obstructive pulmonary disease) (HCC)   Thrombocytopenia (HCC)  Brief narrative/HPI Please refer to admission H&P for details, in brief, 67 year old obese female with history of bipolar disorder, COPD, hypothyroidism and chronic thrombus cytopenia presented to the ED from SNF with abdominal pain associated with nausea and vomiting.  Symptoms started on the day prior to admission.  In the ED she was febrile with temperature of 39.1 C, remaining vitals normal.  Chest x-ray was negative for any infiltrate.  CBC showed chronic stable thrombocytopenia with platelets of 92,000, normal lactic acid, chemistry showed mild hyperbilirubinemia and hyperglycemia. CT of the abdomen pelvis showed a left-sided hydronephrosis unchanged from CT scan 2 years ago but showed findings of left pyelonephritis.  UA was positive for UTI. Culture sent and patient placed on empiric ciprofloxacin and admitted to medical floor.Marland Kitchen  Hospital course ESBL E. coli UTI with acute pyelonephritis. Antibiotic transition to IV meropenem with plan on treating for 7 more days.  Midline placed but is not  working so I have ordered for a PICC line to be placed today prior to discharge. Encourage patient on hydration.  Right-sided hydronephrosis Urology was consulted by ED physician who recommended outpatient follow-up given chronic findings.  Bipolar disorder Continue home medications.  Stable  Chronic thrombocytopenia Platelets stable in the range of 80-90,000.  COPD Stable.  Continue albuterol inhaler.  Remaining medical issues are stable.  Seen by PT and recommends SNF.  Consults: None  Procedure: CT abdomen  Family to medication: Brother at bedside  Disposition: SNF   Discharge Instructions  Discharge Instructions    Diet - low sodium heart healthy   Complete by:  As directed    Increase activity slowly   Complete by:  As directed      Allergies as of 08/25/2017      Reactions   Penicillins Hives, Rash   Not effective per patient Pt does not know. She states it was when she was a child. unsure      Medication List    STOP taking these medications   hydrOXYzine 25 MG tablet Commonly known as:  ATARAX/VISTARIL     TAKE these medications   albuterol 108 (90 Base) MCG/ACT inhaler Commonly known as:  PROVENTIL HFA;VENTOLIN HFA Inhale into the lungs every 6 (six) hours as needed for wheezing or shortness of breath.   aspirin 81 MG chewable tablet Chew 81 mg by mouth daily.   atorvastatin 40 MG tablet Commonly known as:  LIPITOR Take 40 mg by mouth daily.   carvedilol 3.125 MG tablet Commonly known as:  COREG Take 3.125 mg by mouth 2 (two) times daily with a meal.   clonazePAM 0.5 MG tablet Commonly known as:  KLONOPIN Take 0.5 mg by mouth 2 (  two) times daily.   divalproex 250 MG DR tablet Commonly known as:  DEPAKOTE Take 250 mg by mouth 3 (three) times daily.   docusate sodium 100 MG capsule Commonly known as:  COLACE Take 100 mg by mouth as needed for mild constipation.   escitalopram 10 MG tablet Commonly known as:  LEXAPRO Take 10 mg by  mouth daily.   gabapentin 300 MG capsule Commonly known as:  NEURONTIN Take 300 mg by mouth 4 (four) times daily.   ipratropium-albuterol 0.5-2.5 (3) MG/3ML Soln Commonly known as:  DUONEB Take 3 mLs by nebulization every 6 (six) hours as needed.   levothyroxine 100 MCG tablet Commonly known as:  SYNTHROID, LEVOTHROID Take 100 mcg by mouth daily before breakfast.   loratadine 10 MG tablet Commonly known as:  CLARITIN Take 10 mg by mouth daily.   meropenem 1 g in sodium chloride 0.9 % 100 mL Inject 1 g into the vein every 8 (eight) hours for 7 days.   multivitamin tablet Take 1 tablet by mouth daily.   primidone 50 MG tablet Commonly known as:  MYSOLINE Take 50 mg by mouth daily at 8 pm.   zinc oxide 20 % ointment Apply 1 application topically as needed for irritation.      Contact information for after-discharge care    [facility] .   Service:  Skilled Nursing Contact information: [address] [address] [phone]             Allergies  Allergen Reactions  . Penicillins Hives and Rash    Not effective per patient Pt does not know. She states it was when she was a child. unsure        Procedures/Studies: Dg Chest 1 View  Result Date: 08/05/2017 CLINICAL DATA:  Confusion EXAM: CHEST  1 VIEW COMPARISON:  Report 04/04/2015 FINDINGS: Scoliosis of the spine. Patchy atelectasis or minimal infiltrate at the left base. No large effusion. Mild cardiomegaly. No pneumothorax. IMPRESSION: Patchy atelectasis or minimal infiltrate at the left base. Mild cardiomegaly. Electronically Signed   By: Donavan Foil M.D.   On: 08/05/2017 23:49   Dg Chest 2 View  Result Date: 08/21/2017 CLINICAL DATA:  Fever, cough, diarrhea, and abdominal pain. EXAM: CHEST - 2 VIEW COMPARISON:  08/05/2017 FINDINGS: Heart is enlarged. Patient is slightly rotated towards the LEFT. There are no focal consolidations or pleural  effusions. No evidence for pulmonary edema. IMPRESSION: Stable cardiomegaly. Electronically Signed   By: Nolon Nations M.D.   On: 08/21/2017 18:59   Ct Head Wo Contrast  Result Date: 08/05/2017 CLINICAL DATA:  Altered LOC EXAM: CT HEAD WITHOUT CONTRAST TECHNIQUE: Contiguous axial images were obtained from the base of the skull through the vertex without intravenous contrast. COMPARISON:  None. FINDINGS: Brain: No acute territorial infarction, hemorrhage or intracranial mass is visualized. Mild atrophy. Normal ventricle size. Vascular: No hyperdense vessel or unexpected calcification. Skull: Normal. Negative for fracture or focal lesion. Sinuses/Orbits: Mucosal thickening in the sphenoid sinus. No acute orbital abnormality Other: None IMPRESSION: No CT evidence for acute intracranial abnormality.  Mild atrophy Electronically Signed   By: Donavan Foil M.D.   On: 08/05/2017 23:49   Mr Brain Wo Contrast  Result Date: 08/06/2017 CLINICAL DATA:  Altered level of consciousness, unexplained. EXAM: MRI HEAD WITHOUT CONTRAST TECHNIQUE: Multiplanar, multiecho pulse sequences of the brain and surrounding structures were obtained without intravenous contrast. COMPARISON:  Head CT from yesterday FINDINGS: Brain: No  acute infarction, hemorrhage, hydrocephalus, extra-axial collection or mass lesion. Mild periventricular FLAIR hyperintensity best attributed to chronic small vessel ischemia. Age normal brain volume. Vascular: Diffusely preserved flow voids. Tortuous intracranial vessels, no chart history of hypertension. Skull and upper cervical spine: No evidence of marrow lesion. Sinuses/Orbits: Bilateral cataract resection. Chronic right sphenoid sinusitis with sclerotic wall thickening by preceding CT. IMPRESSION: No acute finding or explanation for symptoms. Electronically Signed   By: Monte Fantasia M.D.   On: 08/06/2017 11:07   Ct Abdomen Pelvis W Contrast  Result Date: 08/21/2017 CLINICAL DATA:  Abdominal pain  EXAM: CT ABDOMEN AND PELVIS WITH CONTRAST TECHNIQUE: Multidetector CT imaging of the abdomen and pelvis was performed using the standard protocol following bolus administration of intravenous contrast. CONTRAST:  183mL ISOVUE-300 IOPAMIDOL (ISOVUE-300) INJECTION 61% COMPARISON:  08/12/2015 FINDINGS: Lower chest: Lung bases are well aerated with stable right lower lobe nodule when compared with the prior exam. Hepatobiliary: Liver demonstrates some nodularity consistent with mild underlying cirrhosis. No focal mass is seen. Gallbladder is within normal limits. Pancreas: Unremarkable. No pancreatic ductal dilatation or surrounding inflammatory changes. Spleen: Normal in size without focal abnormality. Adrenals/Urinary Tract: Adrenal glands are within normal limits. Kidneys are well visualized bilaterally. The right kidney demonstrates no renal calculi or obstructive change. The left kidney demonstrates considerable perinephric stranding as well as hydronephrosis without evidence of significant hydroureter. This is consistent with a ureteropelvic junction obstruction and is similar to that seen on the prior examination. The perinephric changes likely represent a component of pyelonephritis. Some mottled enhancement is noted in the upper pole on the left. Some slight increased attenuation in the distal ureter on the left is noted. Again no stone is identified. Stomach/Bowel: The appendix is not well visualized. No obstructive or inflammatory changes are seen. Vascular/Lymphatic: No significant vascular findings are present. No enlarged abdominal or pelvic lymph nodes. Reproductive: Scattered small fibroids are noted. Other: No abdominal wall hernia or abnormality. No abdominopelvic ascites. Musculoskeletal: Degenerative changes of lumbar spine are noted. IMPRESSION: Changes consistent with left UPJ obstruction. Associated inflammatory changes and mottled enhancement in the left kidney are noted suggestive of  pyelonephritis. Mild cirrhotic change Uterine fibroids. Electronically Signed   By: Inez Catalina M.D.   On: 08/21/2017 19:08       Subjective: Denies any pain or dysuria.  No overnight events.  Discharge Exam: Vitals:   08/24/17 2122 08/25/17 0334  BP: 120/75 (!) 146/88  Pulse: 86 77  Resp: 19 18  Temp: 97.8 F (36.6 C) 99.7 F (37.6 C)  SpO2: 93% 94%   Vitals:   08/24/17 1335 08/24/17 2022 08/24/17 2122 08/25/17 0334  BP: 125/79  120/75 (!) 146/88  Pulse: 75  86 77  Resp: 20  19 18   Temp: 99.5 F (37.5 C)  97.8 F (36.6 C) 99.7 F (37.6 C)  TempSrc: Oral  Oral Oral  SpO2: 94% (!) 88% 93% 94%  Weight:      Height:        General: Elderly obese female with flat affect, not in distress HEENT: Moist mucosa, supple neck Chest: Clear to auscultation bilaterally CVs: Normal S1-S2, no murmurs GI: Soft, nondistended, nontender Musculoskeletal: Warm, no edema    The results of significant diagnostics from this hospitalization (including imaging, microbiology, ancillary and laboratory) are listed below for reference.     Microbiology: Recent Results (from the past 240 hour(s))  Culture, blood (routine x 2)     Status: None (Preliminary result)   Collection Time:  08/21/17  5:13 PM  Result Value Ref Range Status   Specimen Description BLOOD RIGHT ARM  Final   Special Requests   Final    BOTTLES DRAWN AEROBIC AND ANAEROBIC Blood Culture adequate volume   Culture   Final    NO GROWTH 4 DAYS Performed at [facility], [address], [place], [place] [address]    Report Status PENDING  Incomplete  Culture, blood (routine x 2)     Status: None (Preliminary result)   Collection Time: 08/21/17  5:15 PM  Result Value Ref Range Status   Specimen Description BLOOD RIGHT HAND  Final   Special Requests   Final    BOTTLES DRAWN AEROBIC AND ANAEROBIC Blood Culture adequate volume   Culture   Final    NO GROWTH 4 DAYS Performed at [facility], [address]  [place], [place] [address]    Report Status PENDING  Incomplete  Urine culture     Status: Abnormal   Collection Time: 08/21/17  5:59 PM  Result Value Ref Range Status   Specimen Description   Final    URINE, CLEAN CATCH Performed at [facility], [address] [place], [place] [address]    Special Requests   Final    NONE Performed at [facility], [address], [place], [place] [address]    Culture >=100,000 COLONIES/mL ESCHERICHIA COLI (A)  Final   Report Status 08/24/2017 FINAL  Final   Organism ID, Bacteria ESCHERICHIA COLI (A)  Final      Susceptibility   Escherichia coli - MIC*    AMPICILLIN >=32 RESISTANT Resistant     CEFAZOLIN >=64 RESISTANT Resistant     CEFTRIAXONE >=64 RESISTANT Resistant     CIPROFLOXACIN >=4 RESISTANT Resistant     GENTAMICIN <=1 SENSITIVE Sensitive     IMIPENEM <=0.25 SENSITIVE Sensitive     NITROFURANTOIN <=16 SENSITIVE Sensitive     TRIMETH/SULFA >=320 RESISTANT Resistant     AMPICILLIN/SULBACTAM >=32 RESISTANT Resistant     PIP/TAZO <=4 SENSITIVE Sensitive     * >=100,000 COLONIES/mL ESCHERICHIA COLI     Labs: BNP (last 3 results) No results for input(s): BNP in the last 8760 hours. Basic Metabolic Panel: Recent Labs  Lab 08/21/17 1713 08/22/17 0616 08/23/17 0548  NA 141 140 140  K 4.4 4.7 4.7  CL 103 105 103  CO2 25 25 28   GLUCOSE 182* 136* 150*  BUN 16 13 14   CREATININE 0.78 0.71 0.75  CALCIUM 9.0 8.3* 8.7*   Liver Function Tests: Recent Labs  Lab 08/21/17 1713 08/22/17 0616  AST 17 12*  ALT 11* 11*  ALKPHOS 45 39  BILITOT 2.1* 2.2*  PROT 7.1 6.8  ALBUMIN 3.9 3.5   Recent Labs  Lab 08/21/17 1713  LIPASE 48   No results for input(s): AMMONIA in the last 168 hours. CBC: Recent Labs  Lab 08/21/17 1713 08/22/17 0616 08/23/17 0548  WBC 8.6 8.2 6.5  NEUTROABS 5.2 5.1  --   HGB 13.7 13.1 12.0  HCT 42.0 41.9 37.5  MCV 95.0 97.7 96.9  PLT 92* 82* 92*   Cardiac Enzymes: Recent Labs  Lab 08/21/17 1713   TROPONINI <0.03   BNP: Invalid input(s): POCBNP CBG: No results for input(s): GLUCAP in the last 168 hours. D-Dimer No results for input(s): DDIMER in the last 72 hours. Hgb A1c No results for input(s): HGBA1C in the last 72 hours. Lipid Profile No results for input(s): CHOL, HDL, LDLCALC, TRIG, CHOLHDL, LDLDIRECT in  the last 72 hours. Thyroid function studies No results for input(s): TSH, T4TOTAL, T3FREE, THYROIDAB in the last 72 hours.  Invalid input(s): FREET3 Anemia work up No results for input(s): VITAMINB12, FOLATE, FERRITIN, TIBC, IRON, RETICCTPCT in the last 72 hours. Urinalysis    Component Value Date/Time   COLORURINE YELLOW 08/21/2017 1759   APPEARANCEUR HAZY (A) 08/21/2017 1759   LABSPEC 1.013 08/21/2017 1759   PHURINE 6.0 08/21/2017 1759   GLUCOSEU NEGATIVE 08/21/2017 1759   HGBUR LARGE (A) 08/21/2017 1759   BILIRUBINUR NEGATIVE 08/21/2017 1759   KETONESUR 5 (A) 08/21/2017 1759   PROTEINUR 100 (A) 08/21/2017 1759   NITRITE NEGATIVE 08/21/2017 1759   LEUKOCYTESUR MODERATE (A) 08/21/2017 1759   Sepsis Labs Invalid input(s): PROCALCITONIN,  WBC,  LACTICIDVEN Microbiology Recent Results (from the past 240 hour(s))  Culture, blood (routine x 2)     Status: None (Preliminary result)   Collection Time: 08/21/17  5:13 PM  Result Value Ref Range Status   Specimen Description BLOOD RIGHT ARM  Final   Special Requests   Final    BOTTLES DRAWN AEROBIC AND ANAEROBIC Blood Culture adequate volume   Culture   Final    NO GROWTH 4 DAYS Performed at [facility], [address], [place], [place] [address]    Report Status PENDING  Incomplete  Culture, blood (routine x 2)     Status: None (Preliminary result)   Collection Time: 08/21/17  5:15 PM  Result Value Ref Range Status   Specimen Description BLOOD RIGHT HAND  Final   Special Requests   Final    BOTTLES DRAWN AEROBIC AND ANAEROBIC Blood Culture adequate volume   Culture   Final    NO GROWTH 4 DAYS Performed  at [facility], [address], [place], [place] [address]    Report Status PENDING  Incomplete  Urine culture     Status: Abnormal   Collection Time: 08/21/17  5:59 PM  Result Value Ref Range Status   Specimen Description   Final    URINE, CLEAN CATCH Performed at [facility], [address] [place], [place] [address]    Special Requests   Final    NONE Performed at [facility], [address], [place],  [address]    Culture >=100,000 COLONIES/mL ESCHERICHIA COLI (A)  Final   Report Status 08/24/2017 FINAL  Final   Organism ID, Bacteria ESCHERICHIA COLI (A)  Final      Susceptibility   Escherichia coli - MIC*    AMPICILLIN >=32 RESISTANT Resistant     CEFAZOLIN >=64 RESISTANT Resistant     CEFTRIAXONE >=64 RESISTANT Resistant     CIPROFLOXACIN >=4 RESISTANT Resistant     GENTAMICIN <=1 SENSITIVE Sensitive     IMIPENEM <=0.25 SENSITIVE Sensitive     NITROFURANTOIN <=16 SENSITIVE Sensitive     TRIMETH/SULFA >=320 RESISTANT Resistant     AMPICILLIN/SULBACTAM >=32 RESISTANT Resistant     PIP/TAZO <=4 SENSITIVE Sensitive     * >=100,000 COLONIES/mL ESCHERICHIA COLI     Time coordinating discharge: Over 30 minutes  SIGNED:   Louellen Molder, MD  [facility] 08/25/2017, 12:01 PM Pager   If 7PM-7AM, please contact night-coverage [email or web address] Password [email or web address]

## 2017-08-25 NOTE — Progress Notes (Signed)
Report called to the [facility]. 

## 2017-08-25 NOTE — Progress Notes (Addendum)
Peripherally Inserted Central Catheter/Midline Placement  The IV Nurse has discussed with the patient and/or persons authorized to consent for the patient, the purpose of this procedure and the potential benefits and risks involved with this procedure.  The benefits include less needle sticks, lab draws from the catheter, and the patient may be discharged home with the catheter. Risks include, but not limited to, infection, bleeding, blood clot (thrombus formation), and puncture of an artery; nerve damage and irregular heartbeat and possibility to perform a PICC exchange if needed/ordered by physician.  Alternatives to this procedure were also discussed.  Bard Power PICC patient education guide, fact sheet on infection prevention and patient information card has been provided to patient /or left at bedside.  Consent signed by sister at patient request.  PICC/Midline Placement Documentation  PICC Single Lumen 23/76/28 PICC Right Basilic 42 cm 0 cm (Active)  Indication for Insertion or Continuance of Line Home intravenous therapies (PICC only) 08/25/2017  2:43 PM  Exposed Catheter (cm) 0 cm 08/25/2017  2:43 PM  Site Assessment Clean;Dry;Intact 08/25/2017  2:43 PM  Line Status Flushed;Saline locked;Blood return noted 08/25/2017  2:43 PM  Dressing Type Transparent 08/25/2017  2:43 PM  Dressing Status Clean;Dry;Intact;Antimicrobial disc in place 08/25/2017  2:43 PM  Dressing Change Due 09/01/17 08/25/2017  2:43 PM       Alaina Donati, Nicolette Bang 08/25/2017, 2:44 PM

## 2017-08-26 ENCOUNTER — Non-Acute Institutional Stay (SKILLED_NURSING_FACILITY): Payer: Medicare Other | Admitting: Internal Medicine

## 2017-08-26 ENCOUNTER — Encounter: Payer: Self-pay | Admitting: Internal Medicine

## 2017-08-26 DIAGNOSIS — F319 Bipolar disorder, unspecified: Secondary | ICD-10-CM | POA: Diagnosis not present

## 2017-08-26 DIAGNOSIS — N135 Crossing vessel and stricture of ureter without hydronephrosis: Secondary | ICD-10-CM

## 2017-08-26 DIAGNOSIS — N12 Tubulo-interstitial nephritis, not specified as acute or chronic: Secondary | ICD-10-CM | POA: Diagnosis not present

## 2017-08-26 DIAGNOSIS — D696 Thrombocytopenia, unspecified: Secondary | ICD-10-CM | POA: Diagnosis not present

## 2017-08-26 LAB — CULTURE, BLOOD (ROUTINE X 2)
Culture: NO GROWTH
Culture: NO GROWTH
SPECIAL REQUESTS: ADEQUATE
SPECIAL REQUESTS: ADEQUATE

## 2017-08-26 NOTE — Progress Notes (Signed)
Provider: Veleta Miners  Location:   [place] Room Number: 159/P Place of Service:  SNF (31)  PCP: No primary care provider on file. No care team member to display  Extended Emergency Contact Information Primary Emergency Contact: [facility] Address: [place],  [place] Home Phone: [phone] Relation: Sister  Code Status: Full Code Goals of Care: Advanced Directive information Advanced Directives 08/26/2017  Does Patient Have a Medical Advance Directive? Yes  Type of Advance Directive (No Data)  Does patient want to make changes to medical advance directive? No - Patient declined  Would patient like information on creating a medical advance directive? No - Patient declined      Chief Complaint  Patient presents with  . New Admit To SNF    New Admission Visit    HPI: Patient is a 67 y.o. female seen today for admission to SNF for therapy and IV antibiotics after staying in the hospital from 04/20-04/24 for Pyelonephritis. Patient has h/o Bipolar disorder, COPD, Hypothyroidism,,tremors, Pneumonia with respiratory failure, CHF with EF of 50% in 03/19  She lives in Assisted living facility and presented to ED for fever and lower abdominal pain with dysuria. Her CT scan of abdomen showed Left UPJ Obstruction and Pyelonephritis. Her Urine Culture was positive for E Coli resistant to Most of the Antibiotics and she was started on IV Meropenem for 7 Days. Patient doing well in facility. Her pain is resolved. No Fever or chills. She is walking with her Walker.Denies any SOB or Chest pain. She lives in [facility] and is planning to go back there.   Past Medical History:  Diagnosis Date  . Anxiety   . Bipolar disorder, unspecified ([place])    From [facility] form  . COPD (chronic obstructive pulmonary disease) ([place])   . Dermatitis   . Dysrhythmia   . Hyperlipidemia   . Urinary incontinence    History reviewed. No  pertinent surgical history.  reports that she has been smoking.  She has never used smokeless tobacco. She reports that she drank alcohol. She reports that she has current or past drug history. Social History   Socioeconomic History  . Marital status: Single    Spouse name: Not on file  . Number of children: Not on file  . Years of education: Not on file  . Highest education level: Not on file  Occupational History  . Not on file  Social Needs  . Financial resource strain: Not on file  . Food insecurity:    Worry: Not on file    Inability: Not on file  . Transportation needs:    Medical: Not on file    Non-medical: Not on file  Tobacco Use  . Smoking status: Current Some Day Smoker  . Smokeless tobacco: Never Used  Substance and Sexual Activity  . Alcohol use: Not Currently  . Drug use: Not Currently  . Sexual activity: Not Currently  Lifestyle  . Physical activity:    Days per week: Not on file    Minutes per session: Not on file  . Stress: Not on file  Relationships  . Social connections:    Talks on phone: Not on file    Gets together: Not on file    Attends religious service: Not on file    Active member of club or organization: Not on file    Attends meetings of clubs or organizations: Not on  file    Relationship status: Not on file  . Intimate partner violence:    Fear of current or ex partner: Not on file    Emotionally abused: Not on file    Physically abused: Not on file    Forced sexual activity: Not on file  Other Topics Concern  . Not on file  Social History Narrative  . Not on file    Functional Status Survey:    History reviewed. No pertinent family history.  There are no preventive care reminders to display for this patient.  Allergies  Allergen Reactions  . Penicillins Hives and Rash    Not effective per patient Pt does not know. She states it was when she was a child. unsure     Outpatient Encounter Medications as of 08/26/2017    Medication Sig  . albuterol (PROVENTIL HFA;VENTOLIN HFA) 108 (90 Base) MCG/ACT inhaler Inhale into the lungs every 6 (six) hours as needed for wheezing or shortness of breath.  Marland Kitchen aspirin 81 MG chewable tablet Chew 81 mg by mouth daily.  Marland Kitchen atorvastatin (LIPITOR) 40 MG tablet Take 40 mg by mouth daily.  . carvedilol (COREG) 3.125 MG tablet Take 3.125 mg by mouth 2 (two) times daily with a meal.  . clonazePAM (KLONOPIN) 0.5 MG tablet Take 0.5 mg by mouth 2 (two) times daily.  . divalproex (DEPAKOTE) 250 MG DR tablet Take 250 mg by mouth 3 (three) times daily.  Marland Kitchen docusate sodium (COLACE) 100 MG capsule Take 100 mg by mouth as needed for mild constipation.  Marland Kitchen escitalopram (LEXAPRO) 10 MG tablet Take 10 mg by mouth daily.  Marland Kitchen gabapentin (NEURONTIN) 300 MG capsule Take 300 mg by mouth 4 (four) times daily.  Marland Kitchen ipratropium-albuterol (DUONEB) 0.5-2.5 (3) MG/3ML SOLN Take 3 mLs by nebulization every 6 (six) hours as needed.  Marland Kitchen levothyroxine (SYNTHROID, LEVOTHROID) 100 MCG tablet Take 100 mcg by mouth daily before breakfast.  . loratadine (CLARITIN) 10 MG tablet Take 10 mg by mouth daily.  . meropenem 1 g in sodium chloride 0.9 % 100 mL Inject 1 g into the vein every 8 (eight) hours for 7 days.  . Multiple Vitamin (MULTIVITAMIN) tablet Take 1 tablet by mouth daily.  . OXYGEN Oxygen @@ 2lpm via [place] every shift  . primidone (MYSOLINE) 50 MG tablet Take 50 mg by mouth daily at 8 pm.  . [DISCONTINUED] zinc oxide 20 % ointment Apply 1 application topically as needed for irritation.   No facility-administered encounter medications on file as of 08/26/2017.      Review of Systems  Review of Systems  Constitutional: Negative for activity change, appetite change, chills, diaphoresis, fatigue and fever.  HENT: Negative for mouth sores, postnasal drip, rhinorrhea, sinus pain and sore throat.   Respiratory: Negative for apnea, cough, chest tightness, shortness of breath and wheezing.   Cardiovascular: Negative  for chest pain, palpitations and leg swelling.  Gastrointestinal: Negative for abdominal distention, abdominal pain, constipation, diarrhea, nausea and vomiting.  Genitourinary: Negative for dysuria and frequency.  Musculoskeletal: Negative for arthralgias, joint swelling and myalgias.  Skin: Negative for rash.  Neurological: Negative for dizziness, syncope, weakness, light-headedness and numbness.  Psychiatric/Behavioral: Negative for behavioral problems, confusion and sleep disturbance.     Vitals:   08/26/17 1511  BP: 104/73  Pulse: 74  Resp: 18  Temp: 98.7 F (37.1 C)  TempSrc: Oral  SpO2: 94%   There is no height or weight on file to calculate BMI. Physical Exam  Constitutional: She  is oriented to person, place, and time. She appears well-developed and well-nourished.  HENT:  Head: Normocephalic.  Mouth/Throat: Oropharynx is clear and moist.  Eyes: Pupils are equal, round, and reactive to light.  Neck: Normal range of motion.  Cardiovascular: Normal rate and regular rhythm.  No murmur heard. Pulmonary/Chest: Effort normal and breath sounds normal. No stridor. No respiratory distress. She has no wheezes.  Abdominal: Soft. Bowel sounds are normal. She exhibits no distension. There is no tenderness. There is no guarding.  Musculoskeletal: She exhibits no edema.  Lymphadenopathy:    She has no cervical adenopathy.  Neurological: She is alert and oriented to person, place, and time.  No Focal deficits  Skin: Skin is warm and dry.  Psychiatric: She has a normal mood and affect. Her behavior is normal. Thought content normal.    Labs reviewed: Basic Metabolic Panel: Recent Labs    08/21/17 1713 08/22/17 0616 08/23/17 0548  NA 141 140 140  K 4.4 4.7 4.7  CL 103 105 103  CO2 25 25 28   GLUCOSE 182* 136* 150*  BUN 16 13 14   CREATININE 0.78 0.71 0.75  CALCIUM 9.0 8.3* 8.7*   Liver Function Tests: Recent Labs    08/06/17 0400 08/21/17 1713 08/22/17 0616  AST 14*  17 12*  ALT 9* 11* 11*  ALKPHOS 42 45 39  BILITOT 0.6 2.1* 2.2*  PROT 5.8* 7.1 6.8  ALBUMIN 3.3* 3.9 3.5   Recent Labs    08/21/17 1713  LIPASE 48   Recent Labs    08/05/17 2352  AMMONIA 40*   CBC: Recent Labs    08/05/17 2306  08/21/17 1713 08/22/17 0616 08/23/17 0548  WBC 4.4   < > 8.6 8.2 6.5  NEUTROABS 0.9*  --  5.2 5.1  --   HGB 12.6   < > 13.7 13.1 12.0  HCT 39.5   < > 42.0 41.9 37.5  MCV 96.8   < > 95.0 97.7 96.9  PLT 84*   < > 92* 82* 92*   < > = values in this interval not displayed.   Cardiac Enzymes: Recent Labs    08/21/17 1713  TROPONINI <0.03   BNP: Invalid input(s): POCBNP No results found for: HGBA1C No results found for: TSH No results found for: VITAMINB12 No results found for: FOLATE No results found for: IRON, TIBC, FERRITIN  Imaging and Procedures obtained prior to SNF admission: Dg Chest Port 1 View  Result Date: 08/25/2017 CLINICAL DATA:  67 y/o  F; status post right PICC line placement. EXAM: PORTABLE CHEST 1 VIEW COMPARISON:  08/21/2017 chest radiograph FINDINGS: Right PICC line tip projects over lower SVC. Stable cardiomegaly. No consolidation, effusion, or pneumothorax. Bones are unremarkable. IMPRESSION: Right PICC line tip projects over lower SVC. Electronically Signed   By: Kristine Garbe M.D.   On: 08/25/2017 15:08    Assessment/Plan  Multidrug-resistant E. Coli pyelonephritis Patient will continue IV Meropenem 7 more days She is afebrile Patient has appointment with urology Left UPJ obstruction Follow-up with urology Chronic thrombocytopenia Platelets stable. History of CHF Continue on Coreg Not on any diuretic We will slowly taper the oxygen off Continue with aspirin COPD  continue bronchodilators Hyperlipidemia Continue atorvastatin Bipolar disorder  On valproic acid and Lexapro and Klonipin Hand Tremors On Primidone Hypothyroid Continue levothyroxine Check TSH  Family/ staff Communication:    Labs/tests ordered:  Total time spent in this patient care encounter was 45_ minutes; greater than 50% of the visit spent counseling  patient, reviewing records , Labs and coordinating care for problems addressed at this encounter.

## 2017-08-27 ENCOUNTER — Other Ambulatory Visit: Payer: Self-pay

## 2017-08-27 MED ORDER — CLONAZEPAM 0.5 MG PO TABS: 0.5000 mg | ORAL_TABLET | Freq: Two times a day (BID) | ORAL | 0 refills | Status: DC

## 2017-08-27 NOTE — Telephone Encounter (Signed)
RX Fax for [facility]@ [phone]  

## 2017-08-29 ENCOUNTER — Encounter (HOSPITAL_COMMUNITY)
Admission: RE | Admit: 2017-08-29 | Discharge: 2017-08-29 | Disposition: A | Discharge status: Home / Self Care | Payer: Medicare Other | Source: Skilled Nursing Facility | Attending: Internal Medicine | Admitting: Internal Medicine

## 2017-08-29 DIAGNOSIS — R509 Fever, unspecified: Secondary | ICD-10-CM | POA: Insufficient documentation

## 2017-08-29 LAB — CBC WITH DIFFERENTIAL/PLATELET
Basophils Absolute: 0 10*3/uL (ref 0.0–0.1)
Basophils Relative: 1 %
EOS ABS: 0.1 10*3/uL (ref 0.0–0.7)
EOS PCT: 2 %
HCT: 34.9 % — ABNORMAL LOW (ref 36.0–46.0)
HEMOGLOBIN: 11 g/dL — AB (ref 12.0–15.0)
LYMPHS ABS: 2.1 10*3/uL (ref 0.7–4.0)
Lymphocytes Relative: 41 %
MCH: 29.6 pg (ref 26.0–34.0)
MCHC: 31.5 g/dL (ref 30.0–36.0)
MCV: 94.1 fL (ref 78.0–100.0)
MONO ABS: 0.9 10*3/uL (ref 0.1–1.0)
MONOS PCT: 18 %
NEUTROS PCT: 40 %
Neutro Abs: 2.1 10*3/uL (ref 1.7–7.7)
PLATELETS: 174 10*3/uL (ref 150–400)
RBC: 3.71 MIL/uL — ABNORMAL LOW (ref 3.87–5.11)
RDW: 12.6 % (ref 11.5–15.5)
WBC: 5.2 10*3/uL (ref 4.0–10.5)

## 2017-08-29 LAB — COMPREHENSIVE METABOLIC PANEL
ALBUMIN: 2.8 g/dL — AB (ref 3.5–5.0)
ALK PHOS: 64 U/L (ref 38–126)
ALT: 61 U/L — ABNORMAL HIGH (ref 14–54)
ANION GAP: 8 (ref 5–15)
AST: 35 U/L (ref 15–41)
BUN: 20 mg/dL (ref 6–20)
CALCIUM: 8.6 mg/dL — AB (ref 8.9–10.3)
CO2: 30 mmol/L (ref 22–32)
Chloride: 102 mmol/L (ref 101–111)
Creatinine, Ser: 0.7 mg/dL (ref 0.44–1.00)
GFR calc non Af Amer: >60 mL/min (ref >60)
GLUCOSE: 162 mg/dL — AB (ref 65–99)
POTASSIUM: 5.1 mmol/L (ref 3.5–5.1)
SODIUM: 140 mmol/L (ref 135–145)
Total Bilirubin: 1.1 mg/dL (ref 0.3–1.2)
Total Protein: 5.8 g/dL — ABNORMAL LOW (ref 6.5–8.1)

## 2017-08-30 ENCOUNTER — Encounter (HOSPITAL_COMMUNITY)
Admission: RE | Admit: 2017-08-30 | Discharge: 2017-08-30 | Disposition: A | Discharge status: Home / Self Care | Payer: Medicare Other | Source: Skilled Nursing Facility | Attending: *Deleted | Admitting: *Deleted

## 2017-08-30 ENCOUNTER — Non-Acute Institutional Stay (SKILLED_NURSING_FACILITY): Payer: Medicare Other | Admitting: Internal Medicine

## 2017-08-30 ENCOUNTER — Encounter: Payer: Self-pay | Admitting: Internal Medicine

## 2017-08-30 DIAGNOSIS — N135 Crossing vessel and stricture of ureter without hydronephrosis: Secondary | ICD-10-CM

## 2017-08-30 DIAGNOSIS — N12 Tubulo-interstitial nephritis, not specified as acute or chronic: Secondary | ICD-10-CM

## 2017-08-30 LAB — CBC WITH DIFFERENTIAL/PLATELET
BASOS ABS: 0 10*3/uL (ref 0.0–0.1)
BASOS PCT: 0 %
Eosinophils Absolute: 0.1 10*3/uL (ref 0.0–0.7)
Eosinophils Relative: 1 %
HEMATOCRIT: 33.8 % — AB (ref 36.0–46.0)
HEMOGLOBIN: 10.5 g/dL — AB (ref 12.0–15.0)
LYMPHS PCT: 22 %
Lymphs Abs: 1.2 10*3/uL (ref 0.7–4.0)
MCH: 29.3 pg (ref 26.0–34.0)
MCHC: 31.1 g/dL (ref 30.0–36.0)
MCV: 94.4 fL (ref 78.0–100.0)
Monocytes Absolute: 1 10*3/uL (ref 0.1–1.0)
Monocytes Relative: 17 %
NEUTROS ABS: 3.4 10*3/uL (ref 1.7–7.7)
NEUTROS PCT: 60 %
Platelets: 189 10*3/uL (ref 150–400)
RBC: 3.58 MIL/uL — AB (ref 3.87–5.11)
RDW: 12.6 % (ref 11.5–15.5)
WBC: 5.7 10*3/uL (ref 4.0–10.5)

## 2017-08-30 LAB — BASIC METABOLIC PANEL
ANION GAP: 10 (ref 5–15)
BUN: 19 mg/dL (ref 6–20)
CHLORIDE: 101 mmol/L (ref 101–111)
CO2: 29 mmol/L (ref 22–32)
CREATININE: 0.6 mg/dL (ref 0.44–1.00)
Calcium: 8.7 mg/dL — ABNORMAL LOW (ref 8.9–10.3)
GFR calc non Af Amer: >60 mL/min (ref >60)
Glucose, Bld: 164 mg/dL — ABNORMAL HIGH (ref 65–99)
POTASSIUM: 4.8 mmol/L (ref 3.5–5.1)
Sodium: 140 mmol/L (ref 135–145)

## 2017-08-30 LAB — TSH: TSH: 10.134 u[IU]/mL — ABNORMAL HIGH (ref 0.350–4.500)

## 2017-08-30 NOTE — Progress Notes (Signed)
Location:   [place] Room Number: 159/P Place of Service:  SNF (31) Provider:  Veleta Miners  No primary care provider on file.  No care team member to display  Extended Emergency Contact Information Primary Emergency Contact: [facility] Address: [place],  [place] Home Phone: [phone] Relation: Sister  Code Status:  Full Code Goals of care: Advanced Directive information Advanced Directives 08/30/2017  Does Patient Have a Medical Advance Directive? Yes  Type of Advance Directive (No Data)  Does patient want to make changes to medical advance directive? No - Patient declined  Would patient like information on creating a medical advance directive? No - Patient declined     Chief Complaint  Patient presents with  . Acute Visit    Patients c/o Has had low grade temp all weekend    HPI:  Pt is a 67 y.o. female seen today for an acute visit for Low grade temp over the weekend. Patient was admitted to the SNF for Therapy and IV antibiotics after staying int he hospital from 04/20-04/24 for Pyelonephritis. Patient has h/o Bipolar disorder, COPD, Hypothyroidism,,tremors, Pneumonia with respiratory failure, CHF with EF of 50% in 03/19  She lives in Assisted living facility and presented to ED for fever and lower abdominal pain with dysuria. Her CT scan of abdomen showed Left UPJ Obstruction and Pyelonephritis. Her Urine Culture was positive for E Coli resistant to Most of the Antibiotics and she was started on IV Meropenem for 7 Days  Patient over the weekend had low grade temp of 100. She denies any Chills. No Abdominal Pain. No Nausea ,Vomiting or dysuria. She denies any Cough , SOB or Chest Pain She responded well to Tylenol. And is continuing on IV Meropenem.  Past Medical History:  Diagnosis Date  . Anxiety   . Bipolar disorder, unspecified ([place])    From [facility] form  . COPD (chronic obstructive pulmonary  disease) ([place])   . Dermatitis   . Dysrhythmia   . Hyperlipidemia   . Urinary incontinence    History reviewed. No pertinent surgical history.  Allergies  Allergen Reactions  . Penicillins Hives and Rash    Not effective per patient Pt does not know. She states it was when she was a child. unsure     Review of Systems  Review of Systems  Constitutional: Negative for activity change, appetite change, chills, diaphoresis, fatigue and fever.  HENT: Negative for mouth sores, postnasal drip, rhinorrhea, sinus pain and sore throat.   Respiratory: Negative for apnea, cough, chest tightness, shortness of breath and wheezing.   Cardiovascular: Negative for chest pain, palpitations and leg swelling.  Gastrointestinal: Negative for abdominal distention, abdominal pain, constipation, diarrhea, nausea and vomiting.  Genitourinary: Negative for dysuria and frequency.  Musculoskeletal: Negative for arthralgias, joint swelling and myalgias.  Skin: Negative for rash.  Neurological: Negative for dizziness, syncope, weakness, light-headedness and numbness.  Psychiatric/Behavioral: Negative for behavioral problems, confusion and sleep disturbance.    Outpatient Encounter Medications as of 08/30/2017  Medication Sig  . acetaminophen (TYLENOL) 325 MG tablet Take 650 mg by mouth every 6 (six) hours as needed.  Marland Kitchen albuterol (PROVENTIL HFA;VENTOLIN HFA) 108 (90 Base) MCG/ACT inhaler Inhale into the lungs every 6 (six) hours as needed for wheezing or shortness of breath.  Marland Kitchen aspirin 81 MG chewable tablet Chew 81 mg by mouth daily.  Marland Kitchen atorvastatin (LIPITOR) 40 MG tablet Take 40 mg  by mouth daily.  . carvedilol (COREG) 3.125 MG tablet Take 3.125 mg by mouth 2 (two) times daily with a meal.  . clonazePAM (KLONOPIN) 0.5 MG tablet Take 1 tablet (0.5 mg total) by mouth 2 (two) times daily.  . divalproex (DEPAKOTE) 250 MG DR tablet Take 250 mg by mouth 3 (three) times daily.  Marland Kitchen docusate sodium (COLACE) 100 MG  capsule Take 100 mg by mouth as needed for mild constipation.  Marland Kitchen escitalopram (LEXAPRO) 10 MG tablet Take 10 mg by mouth daily.  Marland Kitchen gabapentin (NEURONTIN) 300 MG capsule Take 300 mg by mouth 4 (four) times daily.  Marland Kitchen ipratropium-albuterol (DUONEB) 0.5-2.5 (3) MG/3ML SOLN Take 3 mLs by nebulization every 6 (six) hours as needed.  Marland Kitchen levothyroxine (SYNTHROID, LEVOTHROID) 100 MCG tablet Take 100 mcg by mouth daily before breakfast.  . loratadine (CLARITIN) 10 MG tablet Take 10 mg by mouth daily.  . meropenem 1 g in sodium chloride 0.9 % 100 mL Inject 1 g into the vein every 8 (eight) hours for 7 days.  . Multiple Vitamin (MULTIVITAMIN) tablet Take 1 tablet by mouth daily.  . OXYGEN Oxygen @@ 2lpm via [place] every shift  . primidone (MYSOLINE) 50 MG tablet Take 50 mg by mouth daily at 8 pm.   No facility-administered encounter medications on file as of 08/30/2017.      There is no immunization history on file for this patient. Pertinent  Health Maintenance Due  Topic Date Due  . DEXA SCAN  09/29/2017 (Originally 03/10/2016)  . COLONOSCOPY  09/29/2017 (Originally 03/10/2001)  . PNA vac Low Risk Adult (1 of 2 - PCV13) 09/29/2017 (Originally 03/10/2016)  . INFLUENZA VACCINE  12/02/2017  . MAMMOGRAM  09/01/2018   No flowsheet data found. Functional Status Survey:    Vitals:   08/30/17 1138  BP: 132/87  Pulse: 61  Resp: 20  Temp: 97.6 F (36.4 C)  TempSrc: Oral  SpO2: 98%   There is no height or weight on file to calculate BMI. Physical Exam  Constitutional: She is oriented to person, place, and time. She appears well-developed and well-nourished.  HENT:  Head: Normocephalic.  Mouth/Throat: Oropharynx is clear and moist.  Eyes: Pupils are equal, round, and reactive to light.  Neck: Neck supple.  Cardiovascular: Normal rate, regular rhythm and normal heart sounds.  Pulmonary/Chest: Effort normal and breath sounds normal.  Abdominal: Soft. Bowel sounds are normal. She exhibits no  distension. There is no tenderness. There is no guarding.  Musculoskeletal: She exhibits no edema.  Neurological: She is alert and oriented to person, place, and time.  Skin: Skin is warm and dry.  Psychiatric: She has a normal mood and affect. Her behavior is normal. Thought content normal.    Labs reviewed: Recent Labs    08/23/17 0548 08/29/17 0615 08/30/17 0610  NA 140 140 140  K 4.7 5.1 4.8  CL 103 102 101  CO2 28 30 29   GLUCOSE 150* 162* 164*  BUN 14 20 19   CREATININE 0.75 0.70 0.60  CALCIUM 8.7* 8.6* 8.7*   Recent Labs    08/21/17 1713 08/22/17 0616 08/29/17 0615  AST 17 12* 35  ALT 11* 11* 61*  ALKPHOS 45 39 64  BILITOT 2.1* 2.2* 1.1  PROT 7.1 6.8 5.8*  ALBUMIN 3.9 3.5 2.8*   Recent Labs    08/22/17 0616 08/23/17 0548 08/29/17 0615 08/30/17 0610  WBC 8.2 6.5 5.2 5.7  NEUTROABS 5.1  --  2.1 3.4  HGB 13.1 12.0 11.0* 10.5*  HCT 41.9 37.5 34.9* 33.8*  MCV 97.7 96.9 94.1 94.4  PLT 82* 92* 174 189   Lab Results  Component Value Date   TSH 10.134 (H) 08/30/2017   No results found for: HGBA1C No results found for: CHOL, HDL, LDLCALC, LDLDIRECT, TRIG, CHOLHDL  Significant Diagnostic Results in last 30 days:  Dg Chest 1 View  Result Date: 08/05/2017 CLINICAL DATA:  Confusion EXAM: CHEST  1 VIEW COMPARISON:  Report 04/04/2015 FINDINGS: Scoliosis of the spine. Patchy atelectasis or minimal infiltrate at the left base. No large effusion. Mild cardiomegaly. No pneumothorax. IMPRESSION: Patchy atelectasis or minimal infiltrate at the left base. Mild cardiomegaly. Electronically Signed   By: Donavan Foil M.D.   On: 08/05/2017 23:49   Dg Chest 2 View  Result Date: 08/21/2017 CLINICAL DATA:  Fever, cough, diarrhea, and abdominal pain. EXAM: CHEST - 2 VIEW COMPARISON:  08/05/2017 FINDINGS: Heart is enlarged. Patient is slightly rotated towards the LEFT. There are no focal consolidations or pleural effusions. No evidence for pulmonary edema. IMPRESSION: Stable  cardiomegaly. Electronically Signed   By: Nolon Nations M.D.   On: 08/21/2017 18:59   Ct Head Wo Contrast  Result Date: 08/05/2017 CLINICAL DATA:  Altered LOC EXAM: CT HEAD WITHOUT CONTRAST TECHNIQUE: Contiguous axial images were obtained from the base of the skull through the vertex without intravenous contrast. COMPARISON:  None. FINDINGS: Brain: No acute territorial infarction, hemorrhage or intracranial mass is visualized. Mild atrophy. Normal ventricle size. Vascular: No hyperdense vessel or unexpected calcification. Skull: Normal. Negative for fracture or focal lesion. Sinuses/Orbits: Mucosal thickening in the sphenoid sinus. No acute orbital abnormality Other: None IMPRESSION: No CT evidence for acute intracranial abnormality.  Mild atrophy Electronically Signed   By: Donavan Foil M.D.   On: 08/05/2017 23:49   Mr Brain Wo Contrast  Result Date: 08/06/2017 CLINICAL DATA:  Altered level of consciousness, unexplained. EXAM: MRI HEAD WITHOUT CONTRAST TECHNIQUE: Multiplanar, multiecho pulse sequences of the brain and surrounding structures were obtained without intravenous contrast. COMPARISON:  Head CT from yesterday FINDINGS: Brain: No acute infarction, hemorrhage, hydrocephalus, extra-axial collection or mass lesion. Mild periventricular FLAIR hyperintensity best attributed to chronic small vessel ischemia. Age normal brain volume. Vascular: Diffusely preserved flow voids. Tortuous intracranial vessels, no chart history of hypertension. Skull and upper cervical spine: No evidence of marrow lesion. Sinuses/Orbits: Bilateral cataract resection. Chronic right sphenoid sinusitis with sclerotic wall thickening by preceding CT. IMPRESSION: No acute finding or explanation for symptoms. Electronically Signed   By: Monte Fantasia M.D.   On: 08/06/2017 11:07   Ct Abdomen Pelvis W Contrast  Result Date: 08/21/2017 CLINICAL DATA:  Abdominal pain EXAM: CT ABDOMEN AND PELVIS WITH CONTRAST TECHNIQUE:  Multidetector CT imaging of the abdomen and pelvis was performed using the standard protocol following bolus administration of intravenous contrast. CONTRAST:  141mL ISOVUE-300 IOPAMIDOL (ISOVUE-300) INJECTION 61% COMPARISON:  08/12/2015 FINDINGS: Lower chest: Lung bases are well aerated with stable right lower lobe nodule when compared with the prior exam. Hepatobiliary: Liver demonstrates some nodularity consistent with mild underlying cirrhosis. No focal mass is seen. Gallbladder is within normal limits. Pancreas: Unremarkable. No pancreatic ductal dilatation or surrounding inflammatory changes. Spleen: Normal in size without focal abnormality. Adrenals/Urinary Tract: Adrenal glands are within normal limits. Kidneys are well visualized bilaterally. The right kidney demonstrates no renal calculi or obstructive change. The left kidney demonstrates considerable perinephric stranding as well as hydronephrosis without evidence of significant hydroureter. This is consistent with a ureteropelvic junction obstruction and is similar  to that seen on the prior examination. The perinephric changes likely represent a component of pyelonephritis. Some mottled enhancement is noted in the upper pole on the left. Some slight increased attenuation in the distal ureter on the left is noted. Again no stone is identified. Stomach/Bowel: The appendix is not well visualized. No obstructive or inflammatory changes are seen. Vascular/Lymphatic: No significant vascular findings are present. No enlarged abdominal or pelvic lymph nodes. Reproductive: Scattered small fibroids are noted. Other: No abdominal wall hernia or abnormality. No abdominopelvic ascites. Musculoskeletal: Degenerative changes of lumbar spine are noted. IMPRESSION: Changes consistent with left UPJ obstruction. Associated inflammatory changes and mottled enhancement in the left kidney are noted suggestive of pyelonephritis. Mild cirrhotic change Uterine fibroids.  Electronically Signed   By: Inez Catalina M.D.   On: 08/21/2017 19:08   Dg Chest Port 1 View  Result Date: 08/25/2017 CLINICAL DATA:  67 y/o  F; status post right PICC line placement. EXAM: PORTABLE CHEST 1 VIEW COMPARISON:  08/21/2017 chest radiograph FINDINGS: Right PICC line tip projects over lower SVC. Stable cardiomegaly. No consolidation, effusion, or pneumothorax. Bones are unremarkable. IMPRESSION: Right PICC line tip projects over lower SVC. Electronically Signed   By: Kristine Garbe M.D.   On: 08/25/2017 15:08    Assessment/Plan Multidrug-resistant E. Coli pyelonephritis Today is her last day of Meropenem. Will continue PICC line till sure she stays afebrile She has been afebrile now for 24 hours. Blood Cultures if Fever spike more then 101. Patient has appointment with Urology tomorrow. Will wait for their Input. Don't see any other signs of infection. White Count is normal.   Family/ staff Communication:   Labs/tests ordered:   Total time spent in this patient care encounter was 25_ minutes; greater than 50% of the visit spent counseling patient and her sister, reviewing records , Labs and coordinating care for problems addressed at this encounter.

## 2017-09-02 ENCOUNTER — Ambulatory Visit (HOSPITAL_COMMUNITY): Payer: Medicare Other

## 2017-09-03 ENCOUNTER — Non-Acute Institutional Stay (SKILLED_NURSING_FACILITY): Payer: Medicare Other | Admitting: Internal Medicine

## 2017-09-03 DIAGNOSIS — D696 Thrombocytopenia, unspecified: Secondary | ICD-10-CM | POA: Diagnosis not present

## 2017-09-03 DIAGNOSIS — N12 Tubulo-interstitial nephritis, not specified as acute or chronic: Secondary | ICD-10-CM

## 2017-09-03 DIAGNOSIS — R509 Fever, unspecified: Secondary | ICD-10-CM | POA: Diagnosis not present

## 2017-09-03 DIAGNOSIS — E039 Hypothyroidism, unspecified: Secondary | ICD-10-CM | POA: Diagnosis not present

## 2017-09-04 DIAGNOSIS — R509 Fever, unspecified: Secondary | ICD-10-CM | POA: Insufficient documentation

## 2017-09-04 NOTE — Progress Notes (Signed)
This is an acute visit.    Level care skilled.  Facility is [facility].  Chief complaint-acute visit secondary to low-grade fever-  elevated TSH  History of present illness.  Patient is a pleasant 67 year old female seen today for a mildly elevated temperature as well as elevated TSH.  She is here for rehab after hospital admission for pyelonephritis.  She also has a history of bipolar disorder COPD hypothyroidism pneumonia with respiratory failure and CHF with an ejection fraction of 50% on echo done in March 2019.  She presented to the ER with fever and lower abdominal pain with dysuria-CT scan of the abdomen showed a left UPJ obstruction and pyelonephritis-urine culture was positive for E. coli which was resistant to many antibiotics- she was started on IV meropenem for 7 days.  And she did well pain resolved.  It appears during her stay here she has at times developed some low-grade fevers per review of chart appears at times she runs a slightly over 100-was 99.9 this evening according to nursing.  She is not complaining of any dysuria fever or chills or increased cough or congestion.  --Is she has completed her antibiotic and PICC line was removed today  I do note she did have a recent TSH done however which is elevated at 10.134 she is on Synthroid 100 mcg a day and appears she has been on this for a while from what I can ascertain from the chart.  Currently she is resting in bed comfortably other than the low-grade temp vital signs are stable    Past Medical History:  Diagnosis Date  . Anxiety   . Bipolar disorder, unspecified ([place])    From [facility] form  . COPD (chronic obstructive pulmonary disease) ([place])   . Dermatitis   . Dysrhythmia   . Hyperlipidemia   . Urinary incontinence    History reviewed. No pertinent surgical history.  reports that she has been smoking.  She has never used smokeless tobacco. She reports that she drank alcohol. She  reports that she has current or past drug history. Social History        Socioeconomic History  . Marital status: Single    Spouse name: Not on file  . Number of children: Not on file  . Years of education: Not on file  . Highest education level: Not on file  Occupational History  . Not on file  Social Needs  . Financial resource strain: Not on file  . Food insecurity:    Worry: Not on file    Inability: Not on file  . Transportation needs:    Medical: Not on file    Non-medical: Not on file  Tobacco Use  . Smoking status: Current Some Day Smoker  . Smokeless tobacco: Never Used  Substance and Sexual Activity  . Alcohol use: Not Currently  . Drug use: Not Currently  . Sexual activity: Not Currently  Lifestyle  . Physical activity:    Days per week: Not on file    Minutes per session: Not on file  . Stress: Not on file  Relationships  . Social connections:    Talks on phone: Not on file    Gets together: Not on file    Attends religious service: Not on file    Active member of club or organization: Not on file    Attends meetings of clubs or organizations: Not on file    Relationship status: Not on file  . Intimate partner  violence:    Fear of current or ex partner: Not on file    Emotionally abused: Not on file    Physically abused: Not on file    Forced sexual activity: Not on file  Other Topics Concern  . Not on file  Social History Narrative  . Not on file    Functional Status Survey:  History reviewed. No pertinent family history.  There are no preventive care reminders to display for this patient.       Allergies  Allergen Reactions  . Penicillins Hives and Rash    Not effective per patient Pt does not know. She states it was when she was a child. unsure       Medications        Sig  . albuterol (PROVENTIL HFA;VENTOLIN HFA) 108 (90 Base) MCG/ACT inhaler Inhale into the lungs every 6 (six) hours  as needed for wheezing or shortness of breath.  Marland Kitchen aspirin 81 MG chewable tablet Chew 81 mg by mouth daily.  Marland Kitchen atorvastatin (LIPITOR) 40 MG tablet Take 40 mg by mouth daily.  . carvedilol (COREG) 3.125 MG tablet Take 3.125 mg by mouth 2 (two) times daily with a meal.  . clonazePAM (KLONOPIN) 0.5 MG tablet Take 0.5 mg by mouth 2 (two) times daily.  . divalproex (DEPAKOTE) 250 MG DR tablet Take 250 mg by mouth 3 (three) times daily.  Marland Kitchen docusate sodium (COLACE) 100 MG capsule Take 100 mg by mouth as needed for mild constipation.  Marland Kitchen escitalopram (LEXAPRO) 10 MG tablet Take 10 mg by mouth daily.  Marland Kitchen gabapentin (NEURONTIN) 300 MG capsule Take 300 mg by mouth 4 (four) times daily.  Marland Kitchen ipratropium-albuterol (DUONEB) 0.5-2.5 (3) MG/3ML SOLN Take 3 mLs by nebulization every 6 (six) hours as needed.  Marland Kitchen levothyroxine (SYNTHROID, LEVOTHROID) 100 MCG tablet Take 100 mcg by mouth daily before breakfast.  . loratadine (CLARITIN) 10 MG tablet Take 10 mg by mouth daily.  .  She has completed her antibiotic meropenem   . Multiple Vitamin (MULTIVITAMIN) tablet Take 1 tablet by mouth daily.  . OXYGEN Oxygen @@ 2lpm via [place] every shift  . primidone (MYSOLINE) 50 MG tablet Take 50 mg by mouth daily at 8 pm.  . [DISCONTINUED] zinc oxide 20 % ointment Apply 1 application topically as needed for irritation.   No facility-administered encounter medications on file as of 08/26/2017.     I review of systems.  Is not complaining of any fever or chills.  Skin does not complain of rashes or itching.  Head ears eyes nose mouth and throat does not complain of sore throat or visual changes.  Respiratory is not complaining of shortness of breath cough or congestion.  Cardiac does not or increased lower extremity edema.  GI denies abdominal pain nausea vomiting diarrhea constipation.  GU denies dysuria or any burning with urination.  Musculoskeletal is not complaining of joint pain currently.  Neurologic does not  complain of dizziness headache syncope or numbness.  Insight is not complaining feeling depressed or anxious at this time.  Physical exam.  Temperature is 99.9 pulse 80 respirations 18 blood pressure 110/80.  In  general this is a pleasant elderly female no distress sitting comfortably on side of her bed.  Her skin is warm and dry is not increased warm to touch from baseline there is no diaphoresis.  Eyes pupils appear equal round react to light sclera and conjunctive are clear I do not note any drainage.  Oropharynx is clear  mucous membranes moist.  Chest is clear to auscultation there is no labored breathing.  Heart is regular rate and rhythm without murmur gallop or rub she has trace lower extremity edema.  Abdomen is soft nontender with positive bowel sounds  Musculoskeletal  extremities x4 it appears at baseline.  Neurologic is grossly intact no lateralizing findings her speech is clear.  Psych she is grossly alert and oriented pleasant and appropriate Labs.    August 30, 2017.  Sodium 140 potassium 4.8 BUN 19 creatinine 0.6.  TSH 10.134.  WBC 5.7 hemoglobin 10.5 platelets 189   assessment and plan.  Low-grade elevated temp of 99.9- I did discuss this with Dr. Lyndel Safe via phone who actually saw her earlier this week- at this point will monitor since she does not appear to be overtly febrile-again she has had occasional mildly elevated temperatures during her stay here--.  Per discussion with Dr. Lyndel Safe will order blood cultures for any temperature greater than 101.1-continue to monitor this closely but clinically she appears to actually be doing quite well with no complaints that would indicate sepsis including fever chills cough congestion or dysuria or diarrhea.  She has just completed her antibiotic for pyelonephritis and appears to be asymptomatic in this regard  2.  History of hypothyroidism TSH is elevated at over 10 will increase her Synthroid up to 125 mcg a day  and update a TSH in 4 weeks.    #3 history of anemia as well as thrombocytopenia-this appears to have stabilized but will updatelab next lab day last hemoglobin was 10.5 platelet 189 white count was normal at 5.7 on lab done on April 29        [id number]

## 2017-09-05 ENCOUNTER — Encounter: Payer: Self-pay | Admitting: Internal Medicine

## 2017-09-06 ENCOUNTER — Encounter (HOSPITAL_COMMUNITY)
Admission: RE | Admit: 2017-09-06 | Discharge: 2017-09-06 | Disposition: A | Discharge status: Home / Self Care | Payer: Medicare Other | Source: Skilled Nursing Facility | Attending: Internal Medicine | Admitting: Internal Medicine

## 2017-09-06 ENCOUNTER — Encounter: Payer: Self-pay | Admitting: Internal Medicine

## 2017-09-06 ENCOUNTER — Non-Acute Institutional Stay (SKILLED_NURSING_FACILITY): Payer: Medicare Other | Admitting: Internal Medicine

## 2017-09-06 DIAGNOSIS — N135 Crossing vessel and stricture of ureter without hydronephrosis: Secondary | ICD-10-CM | POA: Diagnosis not present

## 2017-09-06 DIAGNOSIS — N12 Tubulo-interstitial nephritis, not specified as acute or chronic: Secondary | ICD-10-CM | POA: Diagnosis not present

## 2017-09-06 DIAGNOSIS — R509 Fever, unspecified: Secondary | ICD-10-CM | POA: Insufficient documentation

## 2017-09-06 LAB — BASIC METABOLIC PANEL
ANION GAP: 7 (ref 5–15)
BUN: 19 mg/dL (ref 6–20)
CO2: 30 mmol/L (ref 22–32)
CREATININE: 0.72 mg/dL (ref 0.44–1.00)
Calcium: 8.5 mg/dL — ABNORMAL LOW (ref 8.9–10.3)
Chloride: 101 mmol/L (ref 101–111)
Glucose, Bld: 194 mg/dL — ABNORMAL HIGH (ref 65–99)
Potassium: 4.7 mmol/L (ref 3.5–5.1)
Sodium: 138 mmol/L (ref 135–145)

## 2017-09-06 LAB — CBC WITH DIFFERENTIAL/PLATELET
Basophils Absolute: 0 10*3/uL (ref 0.0–0.1)
Basophils Relative: 1 %
EOS ABS: 0 10*3/uL (ref 0.0–0.7)
Eosinophils Relative: 1 %
HEMATOCRIT: 31.4 % — AB (ref 36.0–46.0)
Hemoglobin: 9.8 g/dL — ABNORMAL LOW (ref 12.0–15.0)
Lymphocytes Relative: 25 %
Lymphs Abs: 1.4 10*3/uL (ref 0.7–4.0)
MCH: 29.2 pg (ref 26.0–34.0)
MCHC: 31.2 g/dL (ref 30.0–36.0)
MCV: 93.5 fL (ref 78.0–100.0)
MONO ABS: 0.8 10*3/uL (ref 0.1–1.0)
MONOS PCT: 14 %
NEUTROS ABS: 3.4 10*3/uL (ref 1.7–7.7)
Neutrophils Relative %: 59 %
Platelets: 272 10*3/uL (ref 150–400)
RBC: 3.36 MIL/uL — ABNORMAL LOW (ref 3.87–5.11)
RDW: 13 % (ref 11.5–15.5)
WBC: 5.6 10*3/uL (ref 4.0–10.5)

## 2017-09-06 LAB — VITAMIN B12: Vitamin B-12: 671 pg/mL (ref 180–914)

## 2017-09-06 LAB — FOLATE: Folate: 32.1 ng/mL (ref >5.9)

## 2017-09-06 NOTE — Progress Notes (Signed)
Location:   [facility] Room Number: 159/P Place of Service:  SNF (31) Provider:  Veleta Miners  No primary care provider on file.  No care team member to display  Extended Emergency Contact Information Primary Emergency Contact: [facility] Address: [place],  [place] Home Phone: [phone] Relation: Sister  Code Status: Full Code  Goals of care: Advanced Directive information Advanced Directives 09/06/2017  Does Patient Have a Medical Advance Directive? Yes  Type of Advance Directive (No Data)  Does patient want to make changes to medical advance directive? No - Patient declined  Would patient like information on creating a medical advance directive? No - Patient declined     Chief Complaint  Patient presents with  . Acute Visit    Patients c/o Low Grade Temperature    HPI:  Pt is a 67 y.o. female seen today for an acute visit for Low grade Fever.  Patient was admitted to the SNF for Therapy and IV antibiotics after staying int he hospital from 04/20-04/24 for Pyelonephritis. Patient has h/o Bipolardisorder, COPD, Hypothyroidism,,tremors, Pneumonia with respiratory failure, CHF with EF of 50% in 03/19 She lives in Assisted living facility and presented to ED for fever and lower abdominal pain with dysuria.Her CT scan of abdomen showed Left UPJ Obstruction and Pyelonephritis. Her Urine Culture was positive for E Coli resistant to Most of the Antibiotics and she was started on IV Meropenem for 7 Days Patient continues to have Low grade temp in facility. She has finished her Meropenem and her PICC line was removed as her Urine cultures were negative in the Urology office.Patient is suppose to be discharged to ALF in 2 days. She says last night with fever she also had sweats enough to change clothes. She denies any abdominal pain. Her fever was upto 100 last night. No Cough or SOB.  Past Medical History:  Diagnosis Date  .  Anxiety   . Bipolar disorder, unspecified ([place])    From [facility] form  . COPD (chronic obstructive pulmonary disease) ([place])   . Dermatitis   . Dysrhythmia   . Hyperlipidemia   . Urinary incontinence    History reviewed. No pertinent surgical history.  Allergies  Allergen Reactions  . Penicillins Hives and Rash    Not effective per patient Pt does not know. She states it was when she was a child. unsure     Outpatient Encounter Medications as of 09/06/2017  Medication Sig  . acetaminophen (TYLENOL) 325 MG tablet Take 650 mg by mouth every 6 (six) hours as needed.  Marland Kitchen albuterol (PROVENTIL HFA;VENTOLIN HFA) 108 (90 Base) MCG/ACT inhaler Inhale into the lungs every 6 (six) hours as needed for wheezing or shortness of breath.  Marland Kitchen aspirin 81 MG chewable tablet Chew 81 mg by mouth daily.  Marland Kitchen atorvastatin (LIPITOR) 40 MG tablet Take 40 mg by mouth daily.  . carvedilol (COREG) 3.125 MG tablet Take 3.125 mg by mouth 2 (two) times daily with a meal.  . clonazePAM (KLONOPIN) 0.5 MG tablet Take 1 tablet (0.5 mg total) by mouth 2 (two) times daily.  . divalproex (DEPAKOTE) 250 MG DR tablet Take 250 mg by mouth 3 (three) times daily.  Marland Kitchen docusate sodium (COLACE) 100 MG capsule Take 100 mg by mouth as needed for mild constipation.  Marland Kitchen escitalopram (LEXAPRO) 10 MG tablet Take 10 mg by mouth daily.  Marland Kitchen gabapentin (NEURONTIN) 300 MG capsule Take 300  mg by mouth 4 (four) times daily.  Marland Kitchen ipratropium-albuterol (DUONEB) 0.5-2.5 (3) MG/3ML SOLN Take 3 mLs by nebulization every 6 (six) hours as needed.  Marland Kitchen levothyroxine (SYNTHROID, LEVOTHROID) 125 MCG tablet Take 125 mcg by mouth daily before breakfast.  . loratadine (CLARITIN) 10 MG tablet Take 10 mg by mouth daily.  . Multiple Vitamin (MULTIVITAMIN) tablet Take 1 tablet by mouth daily.  . OXYGEN Oxygen @@ 2lpm via [place] every shift  . primidone (MYSOLINE) 50 MG tablet Take 50 mg by mouth daily at 8 pm.  . [DISCONTINUED] levothyroxine (SYNTHROID, LEVOTHROID)  100 MCG tablet Take 100 mcg by mouth daily before breakfast.   No facility-administered encounter medications on file as of 09/06/2017.      Review of Systems  Review of Systems  Constitutional: Negative for activity change, appetite change, chills, diaphoresis, fatigue and fever.  HENT: Negative for mouth sores, postnasal drip, rhinorrhea, sinus pain and sore throat.   Respiratory: Negative for apnea, cough, chest tightness, shortness of breath and wheezing.   Cardiovascular: Negative for chest pain, palpitations and leg swelling.  Gastrointestinal: Negative for abdominal distention, abdominal pain, constipation, diarrhea, nausea and vomiting.  Genitourinary: Negative for dysuria and frequency.  Musculoskeletal: Negative for arthralgias, joint swelling and myalgias.  Skin: Negative for rash.  Neurological: Negative for dizziness, syncope, weakness, light-headedness and numbness.  Psychiatric/Behavioral: Negative for behavioral problems, confusion and sleep disturbance.      There is no immunization history on file for this patient. Pertinent  Health Maintenance Due  Topic Date Due  . DEXA SCAN  09/29/2017 (Originally 03/10/2016)  . COLONOSCOPY  09/29/2017 (Originally 03/10/2001)  . PNA vac Low Risk Adult (1 of 2 - PCV13) 09/29/2017 (Originally 03/10/2016)  . INFLUENZA VACCINE  12/02/2017  . MAMMOGRAM  09/01/2018   No flowsheet data found. Functional Status Survey:    There were no vitals filed for this visit. There is no height or weight on file to calculate BMI. Physical Exam  Constitutional: She is oriented to person, place, and time. She appears well-developed and well-nourished.  HENT:  Head: Normocephalic.  Mouth/Throat: Oropharynx is clear and moist.  Eyes: Pupils are equal, round, and reactive to light.  Neck: Normal range of motion. Neck supple.  Cardiovascular: Normal rate, regular rhythm and normal heart sounds.  Pulmonary/Chest: Effort normal and breath sounds  normal. No stridor. No respiratory distress. She has no wheezes. She has no rales.  Abdominal: Soft. Bowel sounds are normal. She exhibits no distension. There is no tenderness. There is no guarding.  Musculoskeletal: She exhibits no edema.  Neurological: She is alert and oriented to person, place, and time.  Skin: Skin is warm and dry.  Psychiatric: She has a normal mood and affect. Her behavior is normal. Thought content normal.    Labs reviewed: Recent Labs    08/29/17 0615 08/30/17 0610 09/06/17 0730  NA 140 140 138  K 5.1 4.8 4.7  CL 102 101 101  CO2 30 29 30   GLUCOSE 162* 164* 194*  BUN 20 19 19   CREATININE 0.70 0.60 0.72  CALCIUM 8.6* 8.7* 8.5*   Recent Labs    08/21/17 1713 08/22/17 0616 08/29/17 0615  AST 17 12* 35  ALT 11* 11* 61*  ALKPHOS 45 39 64  BILITOT 2.1* 2.2* 1.1  PROT 7.1 6.8 5.8*  ALBUMIN 3.9 3.5 2.8*   Recent Labs    08/29/17 0615 08/30/17 0610 09/06/17 0645  WBC 5.2 5.7 5.6  NEUTROABS 2.1 3.4 3.4  HGB 11.0*  10.5* 9.8*  HCT 34.9* 33.8* 31.4*  MCV 94.1 94.4 93.5  PLT 174 189 272   Lab Results  Component Value Date   TSH 10.134 (H) 08/30/2017   No results found for: HGBA1C No results found for: CHOL, HDL, LDLCALC, LDLDIRECT, TRIG, CHOLHDL  Significant Diagnostic Results in last 30 days:  Dg Chest 2 View  Result Date: 08/21/2017 CLINICAL DATA:  Fever, cough, diarrhea, and abdominal pain. EXAM: CHEST - 2 VIEW COMPARISON:  08/05/2017 FINDINGS: Heart is enlarged. Patient is slightly rotated towards the LEFT. There are no focal consolidations or pleural effusions. No evidence for pulmonary edema. IMPRESSION: Stable cardiomegaly. Electronically Signed   By: Nolon Nations M.D.   On: 08/21/2017 18:59   Ct Abdomen Pelvis W Contrast  Result Date: 08/21/2017 CLINICAL DATA:  Abdominal pain EXAM: CT ABDOMEN AND PELVIS WITH CONTRAST TECHNIQUE: Multidetector CT imaging of the abdomen and pelvis was performed using the standard protocol following  bolus administration of intravenous contrast. CONTRAST:  148mL ISOVUE-300 IOPAMIDOL (ISOVUE-300) INJECTION 61% COMPARISON:  08/12/2015 FINDINGS: Lower chest: Lung bases are well aerated with stable right lower lobe nodule when compared with the prior exam. Hepatobiliary: Liver demonstrates some nodularity consistent with mild underlying cirrhosis. No focal mass is seen. Gallbladder is within normal limits. Pancreas: Unremarkable. No pancreatic ductal dilatation or surrounding inflammatory changes. Spleen: Normal in size without focal abnormality. Adrenals/Urinary Tract: Adrenal glands are within normal limits. Kidneys are well visualized bilaterally. The right kidney demonstrates no renal calculi or obstructive change. The left kidney demonstrates considerable perinephric stranding as well as hydronephrosis without evidence of significant hydroureter. This is consistent with a ureteropelvic junction obstruction and is similar to that seen on the prior examination. The perinephric changes likely represent a component of pyelonephritis. Some mottled enhancement is noted in the upper pole on the left. Some slight increased attenuation in the distal ureter on the left is noted. Again no stone is identified. Stomach/Bowel: The appendix is not well visualized. No obstructive or inflammatory changes are seen. Vascular/Lymphatic: No significant vascular findings are present. No enlarged abdominal or pelvic lymph nodes. Reproductive: Scattered small fibroids are noted. Other: No abdominal wall hernia or abnormality. No abdominopelvic ascites. Musculoskeletal: Degenerative changes of lumbar spine are noted. IMPRESSION: Changes consistent with left UPJ obstruction. Associated inflammatory changes and mottled enhancement in the left kidney are noted suggestive of pyelonephritis. Mild cirrhotic change Uterine fibroids. Electronically Signed   By: Inez Catalina M.D.   On: 08/21/2017 19:08   Dg Chest Port 1 View  Result Date:  08/25/2017 CLINICAL DATA:  67 y/o  F; status post right PICC line placement. EXAM: PORTABLE CHEST 1 VIEW COMPARISON:  08/21/2017 chest radiograph FINDINGS: Right PICC line tip projects over lower SVC. Stable cardiomegaly. No consolidation, effusion, or pneumothorax. Bones are unremarkable. IMPRESSION: Right PICC line tip projects over lower SVC. Electronically Signed   By: Kristine Garbe M.D.   On: 08/25/2017 15:08    Assessment/Plan Multidrug-resistant E. Coli pyelonephritis D/W the Discharge Nurse Will hold discharge. Call Urology Office and update them of low grade fever Send to ED if her fever spikes more then 101 or she is symptomatic.    Family/ staff Communication:   Labs/tests ordered:   Total time spent in this patient care encounter was 25_ minutes; greater than 50% of the visit spent counseling patient, reviewing records , Labs and coordinating care for problems addressed at this encounter.

## 2017-09-06 NOTE — Progress Notes (Deleted)
Location:   [facility] Room Number: 159/P Place of Service:  SNF (31)  Provider: Veleta Miners  PCP: No primary care provider on file. No care team member to display  Extended Emergency Contact Information Primary Emergency Contact: [facility] Address: [place],  [place] Home Phone: [phone] Relation: Sister  Code Status: Full Code Goals of care:  Advanced Directive information Advanced Directives 09/06/2017  Does Patient Have a Medical Advance Directive? Yes  Type of Advance Directive (No Data)  Does patient want to make changes to medical advance directive? No - Patient declined  Would patient like information on creating a medical advance directive? No - Patient declined     Allergies  Allergen Reactions  . Penicillins Hives and Rash    Not effective per patient Pt does not know. She states it was when she was a child. unsure     Chief Complaint  Patient presents with  . Discharge Note    Dischsrge Visit    HPI:  67 y.o. female      Past Medical History:  Diagnosis Date  . Anxiety   . Bipolar disorder, unspecified ([place])    From [facility] form  . COPD (chronic obstructive pulmonary disease) ([place])   . Dermatitis   . Dysrhythmia   . Hyperlipidemia   . Urinary incontinence     History reviewed. No pertinent surgical history.    reports that she has been smoking.  She has never used smokeless tobacco. She reports that she drank alcohol. She reports that she has current or past drug history. Social History   Socioeconomic History  . Marital status: Single    Spouse name: Not on file  . Number of children: Not on file  . Years of education: Not on file  . Highest education level: Not on file  Occupational History  . Not on file  Social Needs  . Financial resource strain: Not on file  . Food insecurity:    Worry: Not on file    Inability: Not on file  . Transportation needs:    Medical:  Not on file    Non-medical: Not on file  Tobacco Use  . Smoking status: Current Some Day Smoker  . Smokeless tobacco: Never Used  Substance and Sexual Activity  . Alcohol use: Not Currently  . Drug use: Not Currently  . Sexual activity: Not Currently  Lifestyle  . Physical activity:    Days per week: Not on file    Minutes per session: Not on file  . Stress: Not on file  Relationships  . Social connections:    Talks on phone: Not on file    Gets together: Not on file    Attends religious service: Not on file    Active member of club or organization: Not on file    Attends meetings of clubs or organizations: Not on file    Relationship status: Not on file  . Intimate partner violence:    Fear of current or ex partner: Not on file    Emotionally abused: Not on file    Physically abused: Not on file    Forced sexual activity: Not on file  Other Topics Concern  . Not on file  Social History Narrative  . Not on file   Functional Status Survey:    Allergies  Allergen Reactions  . Penicillins Hives and Rash    Not effective  per patient Pt does not know. She states it was when she was a child. unsure     Pertinent  Health Maintenance Due  Topic Date Due  . DEXA SCAN  09/29/2017 (Originally 03/10/2016)  . COLONOSCOPY  09/29/2017 (Originally 03/10/2001)  . PNA vac Low Risk Adult (1 of 2 - PCV13) 09/29/2017 (Originally 03/10/2016)  . INFLUENZA VACCINE  12/02/2017  . MAMMOGRAM  09/01/2018    Medications: Outpatient Encounter Medications as of 09/06/2017  Medication Sig  . acetaminophen (TYLENOL) 325 MG tablet Take 650 mg by mouth every 6 (six) hours as needed.  Marland Kitchen albuterol (PROVENTIL HFA;VENTOLIN HFA) 108 (90 Base) MCG/ACT inhaler Inhale into the lungs every 6 (six) hours as needed for wheezing or shortness of breath.  Marland Kitchen aspirin 81 MG chewable tablet Chew 81 mg by mouth daily.  Marland Kitchen atorvastatin (LIPITOR) 40 MG tablet Take 40 mg by mouth daily.  . carvedilol (COREG) 3.125 MG  tablet Take 3.125 mg by mouth 2 (two) times daily with a meal.  . clonazePAM (KLONOPIN) 0.5 MG tablet Take 1 tablet (0.5 mg total) by mouth 2 (two) times daily.  . divalproex (DEPAKOTE) 250 MG DR tablet Take 250 mg by mouth 3 (three) times daily.  Marland Kitchen docusate sodium (COLACE) 100 MG capsule Take 100 mg by mouth as needed for mild constipation.  Marland Kitchen escitalopram (LEXAPRO) 10 MG tablet Take 10 mg by mouth daily.  Marland Kitchen gabapentin (NEURONTIN) 300 MG capsule Take 300 mg by mouth 4 (four) times daily.  Marland Kitchen ipratropium-albuterol (DUONEB) 0.5-2.5 (3) MG/3ML SOLN Take 3 mLs by nebulization every 6 (six) hours as needed.  Marland Kitchen levothyroxine (SYNTHROID, LEVOTHROID) 125 MCG tablet Take 125 mcg by mouth daily before breakfast.  . loratadine (CLARITIN) 10 MG tablet Take 10 mg by mouth daily.  . Multiple Vitamin (MULTIVITAMIN) tablet Take 1 tablet by mouth daily.  . OXYGEN Oxygen @@ 2lpm via [place] every shift  . primidone (MYSOLINE) 50 MG tablet Take 50 mg by mouth daily at 8 pm.  . [DISCONTINUED] levothyroxine (SYNTHROID, LEVOTHROID) 100 MCG tablet Take 100 mcg by mouth daily before breakfast.   No facility-administered encounter medications on file as of 09/06/2017.      Review of Systems  There were no vitals filed for this visit. There is no height or weight on file to calculate BMI. Physical Exam  Labs reviewed: Basic Metabolic Panel: Recent Labs    08/29/17 0615 08/30/17 0610 09/06/17 0730  NA 140 140 138  K 5.1 4.8 4.7  CL 102 101 101  CO2 30 29 30   GLUCOSE 162* 164* 194*  BUN 20 19 19   CREATININE 0.70 0.60 0.72  CALCIUM 8.6* 8.7* 8.5*   Liver Function Tests: Recent Labs    08/21/17 1713 08/22/17 0616 08/29/17 0615  AST 17 12* 35  ALT 11* 11* 61*  ALKPHOS 45 39 64  BILITOT 2.1* 2.2* 1.1  PROT 7.1 6.8 5.8*  ALBUMIN 3.9 3.5 2.8*   Recent Labs    08/21/17 1713  LIPASE 48   Recent Labs    08/05/17 2352  AMMONIA 40*   CBC: Recent Labs    08/29/17 0615 08/30/17 0610 09/06/17 0645   WBC 5.2 5.7 5.6  NEUTROABS 2.1 3.4 3.4  HGB 11.0* 10.5* 9.8*  HCT 34.9* 33.8* 31.4*  MCV 94.1 94.4 93.5  PLT 174 189 272   Cardiac Enzymes: Recent Labs    08/21/17 1713  TROPONINI <0.03   BNP: Invalid input(s): POCBNP CBG: No results for input(s): GLUCAP in  the last 8760 hours.  Procedures and Imaging Studies During Stay: Dg Chest 2 View  Result Date: 08/21/2017 CLINICAL DATA:  Fever, cough, diarrhea, and abdominal pain. EXAM: CHEST - 2 VIEW COMPARISON:  08/05/2017 FINDINGS: Heart is enlarged. Patient is slightly rotated towards the LEFT. There are no focal consolidations or pleural effusions. No evidence for pulmonary edema. IMPRESSION: Stable cardiomegaly. Electronically Signed   By: Nolon Nations M.D.   On: 08/21/2017 18:59   Ct Abdomen Pelvis W Contrast  Result Date: 08/21/2017 CLINICAL DATA:  Abdominal pain EXAM: CT ABDOMEN AND PELVIS WITH CONTRAST TECHNIQUE: Multidetector CT imaging of the abdomen and pelvis was performed using the standard protocol following bolus administration of intravenous contrast. CONTRAST:  131mL ISOVUE-300 IOPAMIDOL (ISOVUE-300) INJECTION 61% COMPARISON:  08/12/2015 FINDINGS: Lower chest: Lung bases are well aerated with stable right lower lobe nodule when compared with the prior exam. Hepatobiliary: Liver demonstrates some nodularity consistent with mild underlying cirrhosis. No focal mass is seen. Gallbladder is within normal limits. Pancreas: Unremarkable. No pancreatic ductal dilatation or surrounding inflammatory changes. Spleen: Normal in size without focal abnormality. Adrenals/Urinary Tract: Adrenal glands are within normal limits. Kidneys are well visualized bilaterally. The right kidney demonstrates no renal calculi or obstructive change. The left kidney demonstrates considerable perinephric stranding as well as hydronephrosis without evidence of significant hydroureter. This is consistent with a ureteropelvic junction obstruction and is similar  to that seen on the prior examination. The perinephric changes likely represent a component of pyelonephritis. Some mottled enhancement is noted in the upper pole on the left. Some slight increased attenuation in the distal ureter on the left is noted. Again no stone is identified. Stomach/Bowel: The appendix is not well visualized. No obstructive or inflammatory changes are seen. Vascular/Lymphatic: No significant vascular findings are present. No enlarged abdominal or pelvic lymph nodes. Reproductive: Scattered small fibroids are noted. Other: No abdominal wall hernia or abnormality. No abdominopelvic ascites. Musculoskeletal: Degenerative changes of lumbar spine are noted. IMPRESSION: Changes consistent with left UPJ obstruction. Associated inflammatory changes and mottled enhancement in the left kidney are noted suggestive of pyelonephritis. Mild cirrhotic change Uterine fibroids. Electronically Signed   By: Inez Catalina M.D.   On: 08/21/2017 19:08   Dg Chest Port 1 View  Result Date: 08/25/2017 CLINICAL DATA:  67 y/o  F; status post right PICC line placement. EXAM: PORTABLE CHEST 1 VIEW COMPARISON:  08/21/2017 chest radiograph FINDINGS: Right PICC line tip projects over lower SVC. Stable cardiomegaly. No consolidation, effusion, or pneumothorax. Bones are unremarkable. IMPRESSION: Right PICC line tip projects over lower SVC. Electronically Signed   By: Kristine Garbe M.D.   On: 08/25/2017 15:08    Assessment/Plan:   Future labs/tests needed:

## 2017-09-07 ENCOUNTER — Inpatient Hospital Stay (HOSPITAL_COMMUNITY)
Admission: EM | Admit: 2017-09-07 | Discharge: 2017-09-13 | DRG: 853 | Disposition: A | Discharge status: Skilled Nursing Facility | Payer: Medicare Other | Attending: Internal Medicine | Admitting: Internal Medicine

## 2017-09-07 ENCOUNTER — Emergency Department (HOSPITAL_COMMUNITY): Payer: Medicare Other

## 2017-09-07 ENCOUNTER — Other Ambulatory Visit: Payer: Self-pay | Admitting: Urology

## 2017-09-07 ENCOUNTER — Emergency Department (HOSPITAL_COMMUNITY): Payer: Medicare Other | Admitting: Certified Registered"

## 2017-09-07 ENCOUNTER — Encounter (HOSPITAL_COMMUNITY)
Admission: EM | Disposition: A | Discharge status: Skilled Nursing Facility | Payer: Self-pay | Source: Home / Self Care | Attending: Internal Medicine

## 2017-09-07 ENCOUNTER — Other Ambulatory Visit (HOSPITAL_COMMUNITY): Payer: Self-pay

## 2017-09-07 ENCOUNTER — Encounter (HOSPITAL_COMMUNITY)
Admission: AD | Admit: 2017-09-07 | Discharge: 2017-09-07 | Disposition: A | Discharge status: Home / Self Care | Payer: Medicare Other | Source: Skilled Nursing Facility | Attending: Internal Medicine | Admitting: Internal Medicine

## 2017-09-07 ENCOUNTER — Encounter (HOSPITAL_COMMUNITY): Payer: Self-pay | Admitting: Anesthesiology

## 2017-09-07 ENCOUNTER — Other Ambulatory Visit: Payer: Self-pay

## 2017-09-07 ENCOUNTER — Encounter (HOSPITAL_COMMUNITY): Payer: Self-pay | Admitting: Certified Registered"

## 2017-09-07 ENCOUNTER — Encounter: Payer: Self-pay | Admitting: Internal Medicine

## 2017-09-07 ENCOUNTER — Encounter (HOSPITAL_COMMUNITY): Payer: Self-pay | Admitting: *Deleted

## 2017-09-07 DIAGNOSIS — D649 Anemia, unspecified: Secondary | ICD-10-CM | POA: Diagnosis not present

## 2017-09-07 DIAGNOSIS — Z1612 Extended spectrum beta lactamase (ESBL) resistance: Secondary | ICD-10-CM | POA: Diagnosis not present

## 2017-09-07 DIAGNOSIS — E039 Hypothyroidism, unspecified: Secondary | ICD-10-CM | POA: Diagnosis not present

## 2017-09-07 DIAGNOSIS — Z7982 Long term (current) use of aspirin: Secondary | ICD-10-CM

## 2017-09-07 DIAGNOSIS — F319 Bipolar disorder, unspecified: Secondary | ICD-10-CM | POA: Diagnosis present

## 2017-09-07 DIAGNOSIS — J9622 Acute and chronic respiratory failure with hypercapnia: Secondary | ICD-10-CM | POA: Diagnosis present

## 2017-09-07 DIAGNOSIS — J9692 Respiratory failure, unspecified with hypercapnia: Secondary | ICD-10-CM

## 2017-09-07 DIAGNOSIS — R112 Nausea with vomiting, unspecified: Secondary | ICD-10-CM

## 2017-09-07 DIAGNOSIS — B9620 Unspecified Escherichia coli [E. coli] as the cause of diseases classified elsewhere: Secondary | ICD-10-CM | POA: Diagnosis present

## 2017-09-07 DIAGNOSIS — B9629 Other Escherichia coli [E. coli] as the cause of diseases classified elsewhere: Secondary | ICD-10-CM | POA: Diagnosis not present

## 2017-09-07 DIAGNOSIS — E785 Hyperlipidemia, unspecified: Secondary | ICD-10-CM | POA: Diagnosis present

## 2017-09-07 DIAGNOSIS — Z87442 Personal history of urinary calculi: Secondary | ICD-10-CM

## 2017-09-07 DIAGNOSIS — F419 Anxiety disorder, unspecified: Secondary | ICD-10-CM | POA: Diagnosis not present

## 2017-09-07 DIAGNOSIS — E875 Hyperkalemia: Secondary | ICD-10-CM | POA: Diagnosis present

## 2017-09-07 DIAGNOSIS — J9621 Acute and chronic respiratory failure with hypoxia: Secondary | ICD-10-CM | POA: Diagnosis not present

## 2017-09-07 DIAGNOSIS — N390 Urinary tract infection, site not specified: Secondary | ICD-10-CM

## 2017-09-07 DIAGNOSIS — Y92230 Patient room in hospital as the place of occurrence of the external cause: Secondary | ICD-10-CM | POA: Diagnosis not present

## 2017-09-07 DIAGNOSIS — J449 Chronic obstructive pulmonary disease, unspecified: Secondary | ICD-10-CM | POA: Diagnosis present

## 2017-09-07 DIAGNOSIS — D259 Leiomyoma of uterus, unspecified: Secondary | ICD-10-CM | POA: Diagnosis not present

## 2017-09-07 DIAGNOSIS — T41205A Adverse effect of unspecified general anesthetics, initial encounter: Secondary | ICD-10-CM | POA: Diagnosis not present

## 2017-09-07 DIAGNOSIS — N179 Acute kidney failure, unspecified: Secondary | ICD-10-CM | POA: Diagnosis present

## 2017-09-07 DIAGNOSIS — I10 Essential (primary) hypertension: Secondary | ICD-10-CM | POA: Diagnosis present

## 2017-09-07 DIAGNOSIS — A4151 Sepsis due to Escherichia coli [E. coli]: Principal | ICD-10-CM | POA: Diagnosis present

## 2017-09-07 DIAGNOSIS — G250 Essential tremor: Secondary | ICD-10-CM | POA: Diagnosis not present

## 2017-09-07 DIAGNOSIS — N151 Renal and perinephric abscess: Secondary | ICD-10-CM | POA: Diagnosis present

## 2017-09-07 DIAGNOSIS — Z1624 Resistance to multiple antibiotics: Secondary | ICD-10-CM | POA: Diagnosis not present

## 2017-09-07 DIAGNOSIS — Z79890 Hormone replacement therapy (postmenopausal): Secondary | ICD-10-CM

## 2017-09-07 DIAGNOSIS — N136 Pyonephrosis: Secondary | ICD-10-CM | POA: Diagnosis present

## 2017-09-07 DIAGNOSIS — Z8619 Personal history of other infectious and parasitic diseases: Secondary | ICD-10-CM

## 2017-09-07 DIAGNOSIS — G92 Toxic encephalopathy: Secondary | ICD-10-CM | POA: Diagnosis not present

## 2017-09-07 DIAGNOSIS — N12 Tubulo-interstitial nephritis, not specified as acute or chronic: Secondary | ICD-10-CM | POA: Diagnosis present

## 2017-09-07 DIAGNOSIS — Z01818 Encounter for other preprocedural examination: Secondary | ICD-10-CM

## 2017-09-07 DIAGNOSIS — Z79899 Other long term (current) drug therapy: Secondary | ICD-10-CM

## 2017-09-07 DIAGNOSIS — F17200 Nicotine dependence, unspecified, uncomplicated: Secondary | ICD-10-CM | POA: Diagnosis not present

## 2017-09-07 DIAGNOSIS — N119 Chronic tubulo-interstitial nephritis, unspecified: Secondary | ICD-10-CM | POA: Diagnosis not present

## 2017-09-07 DIAGNOSIS — R509 Fever, unspecified: Secondary | ICD-10-CM

## 2017-09-07 DIAGNOSIS — Z880 Allergy status to penicillin: Secondary | ICD-10-CM

## 2017-09-07 HISTORY — PX: CYSTOSCOPY W/ URETERAL STENT PLACEMENT: SHX1429

## 2017-09-07 LAB — COMPREHENSIVE METABOLIC PANEL
ALBUMIN: 2.9 g/dL — AB (ref 3.5–5.0)
ALK PHOS: 56 U/L (ref 38–126)
ALT: 22 U/L (ref 14–54)
AST: 21 U/L (ref 15–41)
Anion gap: 11 (ref 5–15)
BILIRUBIN TOTAL: 0.9 mg/dL (ref 0.3–1.2)
BUN: 23 mg/dL — AB (ref 6–20)
CALCIUM: 8.7 mg/dL — AB (ref 8.9–10.3)
CO2: 27 mmol/L (ref 22–32)
CREATININE: 1.03 mg/dL — AB (ref 0.44–1.00)
Chloride: 100 mmol/L — ABNORMAL LOW (ref 101–111)
GFR calc Af Amer: >60 mL/min (ref >60)
GFR, EST NON AFRICAN AMERICAN: 55 mL/min — AB (ref >60)
GLUCOSE: 154 mg/dL — AB (ref 65–99)
POTASSIUM: 4.5 mmol/L (ref 3.5–5.1)
Sodium: 138 mmol/L (ref 135–145)
TOTAL PROTEIN: 7.5 g/dL (ref 6.5–8.1)

## 2017-09-07 LAB — CBC WITH DIFFERENTIAL/PLATELET
BASOS ABS: 0 10*3/uL (ref 0.0–0.1)
BASOS PCT: 0 %
BASOS PCT: 1 %
Basophils Absolute: 0 10*3/uL (ref 0.0–0.1)
EOS ABS: 0.1 10*3/uL (ref 0.0–0.7)
EOS ABS: 0 10*3/uL (ref 0.0–0.7)
EOS PCT: 0 %
Eosinophils Relative: 1 %
HCT: 30 % — ABNORMAL LOW (ref 36.0–46.0)
HEMATOCRIT: 30.7 % — AB (ref 36.0–46.0)
Hemoglobin: 9.8 g/dL — ABNORMAL LOW (ref 12.0–15.0)
Hemoglobin: 9.4 g/dL — ABNORMAL LOW (ref 12.0–15.0)
LYMPHS PCT: 20 %
Lymphocytes Relative: 26 %
Lymphs Abs: 1.6 10*3/uL (ref 0.7–4.0)
Lymphs Abs: 1.2 10*3/uL (ref 0.7–4.0)
MCH: 29.7 pg (ref 26.0–34.0)
MCH: 29 pg (ref 26.0–34.0)
MCHC: 31.9 g/dL (ref 30.0–36.0)
MCHC: 31.3 g/dL (ref 30.0–36.0)
MCV: 93 fL (ref 78.0–100.0)
MCV: 92.6 fL (ref 78.0–100.0)
MONO ABS: 1 10*3/uL (ref 0.1–1.0)
MONOS PCT: 16 %
Monocytes Absolute: 1.1 10*3/uL — ABNORMAL HIGH (ref 0.1–1.0)
Monocytes Relative: 19 %
Neutro Abs: 3.5 10*3/uL (ref 1.7–7.7)
Neutro Abs: 3.4 10*3/uL (ref 1.7–7.7)
Neutrophils Relative %: 57 %
Neutrophils Relative %: 60 %
PLATELETS: 255 10*3/uL (ref 150–400)
Platelets: 268 10*3/uL (ref 150–400)
RBC: 3.3 MIL/uL — ABNORMAL LOW (ref 3.87–5.11)
RBC: 3.24 MIL/uL — AB (ref 3.87–5.11)
RDW: 13.1 % (ref 11.5–15.5)
RDW: 13.2 % (ref 11.5–15.5)
WBC: 6.1 10*3/uL (ref 4.0–10.5)
WBC: 5.7 10*3/uL (ref 4.0–10.5)

## 2017-09-07 LAB — URINALYSIS, ROUTINE W REFLEX MICROSCOPIC
BILIRUBIN URINE: NEGATIVE
GLUCOSE, UA: NEGATIVE mg/dL
KETONES UR: NEGATIVE mg/dL
NITRITE: NEGATIVE
PH: 6 (ref 5.0–8.0)
PROTEIN: 100 mg/dL — AB
Specific Gravity, Urine: 1.014 (ref 1.005–1.030)

## 2017-09-07 LAB — BASIC METABOLIC PANEL
Anion gap: 10 (ref 5–15)
BUN: 18 mg/dL (ref 6–20)
CALCIUM: 8.5 mg/dL — AB (ref 8.9–10.3)
CO2: 28 mmol/L (ref 22–32)
CREATININE: 0.79 mg/dL (ref 0.44–1.00)
Chloride: 99 mmol/L — ABNORMAL LOW (ref 101–111)
GFR calc non Af Amer: >60 mL/min (ref >60)
Glucose, Bld: 147 mg/dL — ABNORMAL HIGH (ref 65–99)
Potassium: 5 mmol/L (ref 3.5–5.1)
Sodium: 137 mmol/L (ref 135–145)

## 2017-09-07 LAB — TROPONIN I: Troponin I: <0.03 ng/mL (ref <0.03)

## 2017-09-07 LAB — I-STAT CG4 LACTIC ACID, ED: Lactic Acid, Venous: 1 mmol/L (ref 0.5–1.9)

## 2017-09-07 SURGERY — CYSTOSCOPY, WITH RETROGRADE PYELOGRAM AND URETERAL STENT INSERTION
Anesthesia: General | Site: Ureter | Laterality: Left

## 2017-09-07 MED ORDER — SODIUM CHLORIDE 0.9 % IV SOLN
1.0000 g | Freq: Once | INTRAVENOUS | Status: AC
  Administered 2017-09-07: 1 g via INTRAVENOUS
  Filled 2017-09-07: qty 1

## 2017-09-07 MED ORDER — OXYCODONE HCL 5 MG PO TABS: 5.0000 mg | ORAL_TABLET | Freq: Once | ORAL | Status: DC | PRN

## 2017-09-07 MED ORDER — VANCOMYCIN HCL IN DEXTROSE 1-5 GM/200ML-% IV SOLN
1000.0000 mg | Freq: Once | INTRAVENOUS | Status: AC
  Administered 2017-09-07: 1000 mg via INTRAVENOUS
  Filled 2017-09-07: qty 200

## 2017-09-07 MED ORDER — SODIUM CHLORIDE 0.9 % IV SOLN
2.0000 g | Freq: Once | INTRAVENOUS | Status: AC
  Administered 2017-09-07: 2 g via INTRAVENOUS
  Filled 2017-09-07: qty 2

## 2017-09-07 MED ORDER — MIDAZOLAM HCL 2 MG/2ML IJ SOLN
INTRAMUSCULAR | Status: AC
  Filled 2017-09-07: qty 2

## 2017-09-07 MED ORDER — ONDANSETRON HCL 4 MG/2ML IJ SOLN
4.0000 mg | INTRAMUSCULAR | Status: DC | PRN
  Administered 2017-09-07: 4 mg via INTRAVENOUS
  Filled 2017-09-07: qty 2

## 2017-09-07 MED ORDER — SUCCINYLCHOLINE CHLORIDE 200 MG/10ML IV SOSY
PREFILLED_SYRINGE | INTRAVENOUS | Status: DC | PRN
  Administered 2017-09-07: 100 mg via INTRAVENOUS

## 2017-09-07 MED ORDER — LEVOFLOXACIN IN D5W 750 MG/150ML IV SOLN
750.0000 mg | Freq: Once | INTRAVENOUS | Status: AC
  Administered 2017-09-07: 750 mg via INTRAVENOUS
  Filled 2017-09-07: qty 150

## 2017-09-07 MED ORDER — SODIUM CHLORIDE 0.9 % IV SOLN
1000.0000 mL | INTRAVENOUS | Status: DC
Start: 2017-09-07 — End: 2017-09-08
  Administered 2017-09-07: 1000 mL via INTRAVENOUS

## 2017-09-07 MED ORDER — IOHEXOL 300 MG/ML  SOLN
INTRAMUSCULAR | Status: DC | PRN
  Administered 2017-09-07: 50 mL

## 2017-09-07 MED ORDER — ONDANSETRON HCL 4 MG/2ML IJ SOLN: 4.0000 mg | Freq: Once | INTRAMUSCULAR | Status: DC | PRN

## 2017-09-07 MED ORDER — OXYCODONE HCL 5 MG/5ML PO SOLN
5.0000 mg | Freq: Once | ORAL | Status: DC | PRN
  Filled 2017-09-07: qty 5

## 2017-09-07 MED ORDER — FENTANYL CITRATE (PF) 100 MCG/2ML IJ SOLN
INTRAMUSCULAR | Status: AC
  Filled 2017-09-07: qty 2

## 2017-09-07 MED ORDER — SODIUM CHLORIDE 0.9 % IV BOLUS
500.0000 mL | Freq: Once | INTRAVENOUS | Status: AC
  Administered 2017-09-07: 500 mL via INTRAVENOUS

## 2017-09-07 MED ORDER — PROPOFOL 10 MG/ML IV BOLUS
INTRAVENOUS | Status: AC
  Filled 2017-09-07: qty 20

## 2017-09-07 MED ORDER — ALBUTEROL SULFATE HFA 108 (90 BASE) MCG/ACT IN AERS
INHALATION_SPRAY | RESPIRATORY_TRACT | Status: DC | PRN
  Administered 2017-09-07 (×2): 3 via RESPIRATORY_TRACT

## 2017-09-07 MED ORDER — PROPOFOL 10 MG/ML IV BOLUS
INTRAVENOUS | Status: DC | PRN
  Administered 2017-09-07: 150 mg via INTRAVENOUS
  Administered 2017-09-08: 40 mg via INTRAVENOUS
  Administered 2017-09-08: 20 mg via INTRAVENOUS
  Administered 2017-09-08 (×3): 40 mg via INTRAVENOUS
  Administered 2017-09-08 (×4): 20 mg via INTRAVENOUS

## 2017-09-07 MED ORDER — LIDOCAINE 2% (20 MG/ML) 5 ML SYRINGE
INTRAMUSCULAR | Status: AC
  Filled 2017-09-07: qty 5

## 2017-09-07 MED ORDER — ACETAMINOPHEN 650 MG RE SUPP
650.0000 mg | Freq: Once | RECTAL | Status: AC
  Administered 2017-09-07: 650 mg via RECTAL
  Filled 2017-09-07: qty 1

## 2017-09-07 MED ORDER — LIDOCAINE 2% (20 MG/ML) 5 ML SYRINGE
INTRAMUSCULAR | Status: DC | PRN
  Administered 2017-09-07: 100 mg via INTRAVENOUS

## 2017-09-07 MED ORDER — ONDANSETRON HCL 4 MG/2ML IJ SOLN
INTRAMUSCULAR | Status: DC | PRN
  Administered 2017-09-07: 4 mg via INTRAVENOUS

## 2017-09-07 MED ORDER — FENTANYL CITRATE (PF) 100 MCG/2ML IJ SOLN
INTRAMUSCULAR | Status: DC | PRN
  Administered 2017-09-07 (×2): 50 ug via INTRAVENOUS

## 2017-09-07 MED ORDER — SODIUM CHLORIDE 0.9 % IV BOLUS: 500.0000 mL | Freq: Once | INTRAVENOUS | Status: DC

## 2017-09-07 MED ORDER — SODIUM CHLORIDE 0.9 % IR SOLN
Status: DC | PRN
  Administered 2017-09-07: 3000 mL

## 2017-09-07 MED ORDER — FENTANYL CITRATE (PF) 100 MCG/2ML IJ SOLN: 25.0000 ug | INTRAMUSCULAR | Status: DC | PRN

## 2017-09-07 MED ORDER — MIDAZOLAM HCL 5 MG/5ML IJ SOLN
INTRAMUSCULAR | Status: DC | PRN
  Administered 2017-09-07: 2 mg via INTRAVENOUS

## 2017-09-07 MED ORDER — DEXAMETHASONE SODIUM PHOSPHATE 10 MG/ML IJ SOLN
INTRAMUSCULAR | Status: DC | PRN
  Administered 2017-09-07: 10 mg via INTRAVENOUS

## 2017-09-07 SURGICAL SUPPLY — 12 items
BAG URO CATCHER STRL LF (MISCELLANEOUS) ×2 IMPLANT
CATH INTERMIT  6FR 70CM (CATHETERS) ×2 IMPLANT
CLOTH BEACON ORANGE TIMEOUT ST (SAFETY) ×2 IMPLANT
COVER FOOTSWITCH UNIV (MISCELLANEOUS) ×2 IMPLANT
COVER SURGICAL LIGHT HANDLE (MISCELLANEOUS) ×2 IMPLANT
GLOVE BIOGEL M STRL SZ7.5 (GLOVE) ×2 IMPLANT
GOWN STRL REUS W/TWL LRG LVL3 (GOWN DISPOSABLE) ×4 IMPLANT
GUIDEWIRE STR DUAL SENSOR (WIRE) ×2 IMPLANT
MANIFOLD NEPTUNE II (INSTRUMENTS) ×2 IMPLANT
PACK CYSTO (CUSTOM PROCEDURE TRAY) ×2 IMPLANT
STENT URET 6FRX24 CONTOUR (STENTS) ×2 IMPLANT
TUBING CONNECTING 10 (TUBING) ×2 IMPLANT

## 2017-09-07 NOTE — H&P (Addendum)
History and Physical    Laurie Moreno NGE:[id number] DOB: 1951/01/19 DOA: 09/07/2017  Referring MD/NP/PA: Dr Verta Ellen  PCP: Salvadore Dom, MD   Outpatient Specialists: Dr. Link Snuffer   Patient coming from: [facility] hospital  Chief Complaint: fever and chills  HPI: Laurie Moreno is a 67 y.o. female with medical history significant of nephrolithiasis, infected kidney stone with left hydronephrosis and sepsis who had ESBL Escherichia coli infection diagnosed on 08/21/2017. Patient was placed on meropenem and went home. She came back today due to worsening fever and chills. Urology was consulted and patient was transferred here from any painful treatment. She has other comorbidities and is therefore being admitted to the medical service. She denied any active fever at the moment. No nausea vomiting or diarrhea. She had a temperature up to 103 at home.   ED Course: patient was was evaluated any pain. She's got a white count of 5.7 with hemoglobin 9.4 platelets are 255.Lactic acid is 1.0. rinalysis showed significant evidence of UTI. Previous CT scan on 08/21/2017 showed left sided hydronephrosis with evidence of obstructive uropathy.  Review of Systems: As per HPI otherwise 10 point review of systems negative.    Past Medical History:  Diagnosis Date  . Acute pyelonephritis   . Anxiety   . Bipolar disorder, unspecified ([place])    From [facility] form  . Chronic respiratory failure with hypoxia ([place])   . COPD (chronic obstructive pulmonary disease) ([place])   . Dermatitis   . Dermatitis   . Dysrhythmia   . Essential tremor   . Fever   . History of ESBL E. coli infection   . Hyperlipidemia   . Hypothyroidism   . Thrombocytopenia ([place])   . Unspecified hydronephrosis   . Urinary incontinence     History reviewed. No pertinent surgical history.   reports that she has been smoking.  She has never used smokeless tobacco. She reports that she drank alcohol. She reports that she  has current or past drug history.  Allergies  Allergen Reactions  . Penicillins Hives, Rash and Other (See Comments)    Not effective per patient. Pt does not know. She states it was when she was a child. Unknown    History reviewed. No pertinent family history.   Prior to Admission medications   Medication Sig Start Date End Date Taking? Authorizing Provider  acetaminophen (TYLENOL) 325 MG tablet Take 650 mg by mouth every 6 (six) hours as needed (for temperatures greater than 101. Obtain blood cultures twice for any temperature greater than 101 and notify provider, per [facility]).    Yes [provider]  albuterol (PROVENTIL HFA;VENTOLIN HFA) 108 (90 Base) MCG/ACT inhaler Inhale 1 puff into the lungs every 6 (six) hours as needed for wheezing or shortness of breath.    Yes [provider]  aspirin 81 MG chewable tablet Chew 81 mg by mouth every morning.    Yes [provider]  atorvastatin (LIPITOR) 40 MG tablet Take 40 mg by mouth at bedtime.    Yes [provider]  carvedilol (COREG) 3.125 MG tablet Take 3.125 mg by mouth 2 (two) times daily with a meal.   Yes [provider]  clonazePAM (KLONOPIN) 0.5 MG tablet Take 1 tablet (0.5 mg total) by mouth 2 (two) times daily. 08/27/17  Yes Lassen, Arlo C, PA-C  divalproex (DEPAKOTE) 250 MG DR tablet Take 250 mg by mouth 3 (three) times daily. Do not crush   Yes [provider]  docusate sodium (COLACE) 100 MG capsule Take 100 mg by mouth daily as needed for mild constipation.    Yes [provider]  escitalopram (LEXAPRO) 10 MG tablet Take 10 mg by mouth every morning.    Yes [provider]  gabapentin (NEURONTIN) 300 MG capsule Take 300 mg by mouth 4 (four) times daily.   Yes [provider]  ipratropium-albuterol (DUONEB) 0.5-2.5 (3) MG/3ML SOLN Take 3 mLs by nebulization every 6 (six) hours as needed (Wheezing/Shortness of Breath).    Yes [provider]    levothyroxine (SYNTHROID, LEVOTHROID) 125 MCG tablet Take 125 mcg by mouth daily before breakfast.   Yes [provider]  loratadine (CLARITIN) 10 MG tablet Take 10 mg by mouth every morning.    Yes [provider]  Multiple Vitamin (MULTIVITAMIN) tablet Take 1 tablet by mouth every morning.    Yes [provider]  primidone (MYSOLINE) 50 MG tablet Take 50 mg by mouth at bedtime.    Yes [provider]    Physical Exam: Vitals:   09/07/17 2100 09/07/17 2130 09/07/17 2147 09/07/17 2240  BP: (!) 97/50 (!) 96/57  (!) 98/53  Pulse: 87 84  76  Resp: (!) 28 (!) 26  20  Temp:   99.7 F (37.6 C) 99.1 F (37.3 C)  TempSrc:   Oral Oral  SpO2: 96% 95%  98%  Weight:      Height:          Constitutional: NAD, calm, comfortable Vitals:   09/07/17 2100 09/07/17 2130 09/07/17 2147 09/07/17 2240  BP: (!) 97/50 (!) 96/57  (!) 98/53  Pulse: 87 84  76  Resp: (!) 28 (!) 26  20  Temp:   99.7 F (37.6 C) 99.1 F (37.3 C)  TempSrc:   Oral Oral  SpO2: 96% 95%  98%  Weight:      Height:       Eyes: PERRL, lids and conjunctivae normal ENMT: Mucous membranes are moist. Posterior pharynx clear of any exudate or lesions.Normal dentition.  Neck: normal, supple, no masses, no thyromegaly Respiratory: clear to auscultation bilaterally, no wheezing, no crackles. Normal respiratory effort. No accessory muscle use.  Cardiovascular: Regular rate and rhythm, no murmurs / rubs / gallops. No extremity edema. 2+ pedal pulses. No carotid bruits.  Abdomen: no tenderness, no masses palpated. No hepatosplenomegaly. Bowel sounds positive.  Musculoskeletal: no clubbing / cyanosis. No joint deformity upper and lower extremities. Good ROM, no contractures. Normal muscle tone.  Skin: no rashes, lesions, ulcers. No induration Neurologic: CN 2-12 grossly intact. Sensation intact, DTR normal. Strength 5/5 in all 4.  Psychiatric: Normal judgment and insight. Alert and oriented x 3.  Normal mood.    Labs on Admission: I have personally reviewed following labs and imaging studies  CBC: Recent Labs  Lab 09/06/17 0645 09/07/17 0025 09/07/17 1906  WBC 5.6 6.1 5.7  NEUTROABS 3.4 3.5 3.4  HGB 9.8* 9.8* 9.4*  HCT 31.4* 30.7* 30.0*  MCV 93.5 93.0 92.6  PLT 272 268 094   Basic Metabolic Panel: Recent Labs  Lab 09/06/17 0730 09/07/17 0025 09/07/17 1906  NA 138 137 138  K 4.7 5.0 4.5  CL 101 99* 100*  CO2 30 28 27   GLUCOSE 194* 147* 154*  BUN 19 18 23*  CREATININE 0.72 0.79 1.03*  CALCIUM 8.5* 8.5* 8.7*   GFR: Estimated Creatinine Clearance: 56.3 mL/min (A) (by C-G formula based on SCr of 1.03 mg/dL (H)). Liver Function Tests: Recent Labs  Lab 09/07/17 1906  AST 21  ALT 22  ALKPHOS 56  BILITOT 0.9  PROT 7.5  ALBUMIN 2.9*   No results for input(s): LIPASE, AMYLASE in the last 168 hours. No results for input(s): AMMONIA in the last 168 hours. Coagulation Profile: No results for input(s): INR, PROTIME in the last 168 hours. Cardiac Enzymes: Recent Labs  Lab 09/07/17 1925  TROPONINI <0.03   BNP (last 3 results) No results for input(s): PROBNP in the last 8760 hours. HbA1C: No results for input(s): HGBA1C in the last 72 hours. CBG: No results for input(s): GLUCAP in the last 168 hours. Lipid Profile: No results for input(s): CHOL, HDL, LDLCALC, TRIG, CHOLHDL, LDLDIRECT in the last 72 hours. Thyroid Function Tests: No results for input(s): TSH, T4TOTAL, FREET4, T3FREE, THYROIDAB in the last 72 hours. Anemia Panel: Recent Labs    09/06/17 0648  VITAMINB12 671  FOLATE 32.1   Urine analysis:    Component Value Date/Time   COLORURINE YELLOW 09/07/2017 1857   APPEARANCEUR CLEAR 09/07/2017 1857   LABSPEC 1.014 09/07/2017 1857   PHURINE 6.0 09/07/2017 1857   GLUCOSEU NEGATIVE 09/07/2017 1857   HGBUR MODERATE (A) 09/07/2017 1857   BILIRUBINUR NEGATIVE 09/07/2017 [place] NEGATIVE 09/07/2017 1857   PROTEINUR 100 (A) 09/07/2017  1857   NITRITE NEGATIVE 09/07/2017 1857   LEUKOCYTESUR TRACE (A) 09/07/2017 1857   Sepsis Labs: @LABRCNTIP (procalcitonin:4,lacticidven:4) ) Recent Results (from the past 240 hour(s))  Culture, blood (routine x 2)     Status: None (Preliminary result)   Collection Time: 09/07/17 12:26 AM  Result Value Ref Range Status   Specimen Description LEFT ANTECUBITAL DRAWN BY RN  Final   Special Requests   Final    BOTTLES DRAWN AEROBIC AND ANAEROBIC Blood Culture adequate volume   Culture   Final    NO GROWTH < 24 HOURS Performed at [facility], [address], [place], [place] [address]    Report Status PENDING  Incomplete  Culture, blood (routine x 2)     Status: None (Preliminary result)   Collection Time: 09/07/17 12:30 AM  Result Value Ref Range Status   Specimen Description RIGHT ANTECUBITAL DRAWN BY RN  Final   Special Requests   Final    BOTTLES DRAWN AEROBIC AND ANAEROBIC Blood Culture adequate volume   Culture   Final    NO GROWTH < 24 HOURS Performed at [facility], [address], [place], [place] [address]    Report Status PENDING  Incomplete  Blood Culture (routine x 2)     Status: None (Preliminary result)   Collection Time: 09/07/17  7:07 PM  Result Value Ref Range Status   Specimen Description BLOOD RIGHT FOREARM  Final   Special Requests   Final    BOTTLES DRAWN AEROBIC AND ANAEROBIC Blood Culture adequate volume Performed at [facility], [address], [place], [place] [address]    Culture PENDING  Incomplete   Report Status PENDING  Incomplete  Blood Culture (routine x 2)     Status: None (Preliminary result)   Collection Time: 09/07/17  7:11 PM  Result Value Ref Range Status   Specimen Description BLOOD LEFT FOREARM  Final   Special Requests   Final    BOTTLES DRAWN AEROBIC AND ANAEROBIC Blood Culture adequate volume Performed at [facility], [address], [place],  [address]    Culture PENDING  Incomplete   Report Status PENDING  Incomplete      Radiological Exams on Admission: Dg  Chest Port 1 View  Result Date: 09/07/2017 CLINICAL DATA:  67 year old female with fever for 2 days.  Hypoxia. EXAM: PORTABLE CHEST 1 VIEW COMPARISON:  Portable chest 08/25/2017 and earlier. FINDINGS: Portable AP upright view at 1905 hours. Lower lung volumes and persistent lordotic positioning. Stable mild cardiomegaly. Other mediastinal contours are within normal limits. Visualized tracheal air column is within normal limits. Allowing for portable technique the lungs are clear. Paucity of bowel gas in the upper abdomen. No acute osseous abnormality identified. IMPRESSION: Low lung volumes.  No acute cardiopulmonary abnormality. Electronically Signed   By: Genevie Ann M.D.   On: 09/07/2017 19:38     Assessment/Plan Principal Problem:   Urinary tract infection due to extended-spectrum beta lactamase (ESBL) producing Escherichia coli Active Problems:   Pyelonephritis   COPD (chronic obstructive pulmonary disease) ([place])    #1 acute pyelonephritis with abscess: patient has been taken to the OR and found to have significant abscess within the collecting tubules. Aspirated the abscess intraoperatively. A stent has been placed. She grew ESBL Escherichia coli before. We will continue with meropenem in the hospital. Await cultures from today's aspirate to see if it is the same organism.  #2 nephrolithiasis: Patient has left hydronephrosis. Continue with stent placement by urology  #3 history of COPD: No exacerbation. Continue to monitor patient.  #4 hypothyroidism: Continue levothyroxine   DVT prophylaxis: Lovenox  Code Status: full  Family Communication: sister with patient  Disposition Plan: home when ready  Consults called: Dr. Gloriann Loan urology  Admission status: inpatient  Severity of Illness: The appropriate patient status for this patient is INPATIENT. Inpatient status is judged to be reasonable and necessary in order to provide the required intensity of  service to ensure the patient's safety. The patient's presenting symptoms, physical exam findings, and initial radiographic and laboratory data in the context of their chronic comorbidities is felt to place them at high risk for further clinical deterioration. Furthermore, it is not anticipated that the patient will be medically stable for discharge from the hospital within 2 midnights of admission. The following factors support the patient status of inpatient.   " The patient's presenting symptoms include fever or chills and abdominal pain. " The worrisome physical exam findings include suprapubic tenderness. " The initial radiographic and laboratory data are worrisome because of evidence of UTI. " The chronic co-morbidities include known history of kidney stones.   * I certify that at the point of admission it is my clinical judgment that the patient will require inpatient hospital care spanning beyond 2 midnights from the point of admission due to high intensity of service, high risk for further deterioration and high frequency of surveillance required.Barbette Merino MD [facility] Pager [phone]   If 7PM-7AM, please contact night-coverage [email or web address] Password [facility]  09/07/2017, 11:54 PM

## 2017-09-07 NOTE — Interval H&P Note (Signed)
History and Physical Interval Note:  09/07/2017 10:51 PM  Laurie Moreno  has presented today for surgery, with the diagnosis of left hydronephrosis, sepsis  The various methods of treatment have been discussed with the patient and family. After consideration of risks, benefits and other options for treatment, the patient has consented to  Procedure(s): CYSTOSCOPY WITH RETROGRADE PYELOGRAM/URETERAL STENT PLACEMENT (Left) as a surgical intervention .  The patient's history has been reviewed, patient examined, no change in status, stable for surgery.  I have reviewed the patient's chart and labs.  Questions were answered to the patient's satisfaction.     Marton Redwood, III

## 2017-09-07 NOTE — H&P (Signed)
H&P Physician requesting consult: Sherwood Gambler, MD  Chief Complaint: Left hydronephrosis, questional chronic pyelonephritis  History of Present Illness: 67 year old female recently admitted to the hospital for pyelonephritis on the left.  Urine culture revealed multidrug resistant E. coli.  She was discharged on IV antibiotics.  She continued to have cyclical fever and was seen in my office this afternoon.  She was stable at that time.  She had a low-grade temperature of 100 and a slightly soft blood pressure but physical exam was completely normal and she had 0 complaints including no pain specifically, no flank pain, dysuria, or voiding complaints.  She was therefore scheduled for tomorrow to have a left ureteral stent placement to rule out chronic pyelonephritis causing an inability to clear infection due to ureteropelvic junction obstruction.  Later this evening, the patient developed a fever with nausea and vomiting.  She denies any associated pain.  She presented to [facility] emergency department and a code sepsis was called given that she had a fever of 103.  She has otherwise remained stable and she was transferred down to [place] long in stable condition.  The patient currently has no complaints.  She still denies any significant pain, voiding complaints.  Blood pressure is still little bit soft.  Urinalysis was not particularly impressive for infection.  Past Medical History:  Diagnosis Date  . Acute pyelonephritis   . Anxiety   . Bipolar disorder, unspecified ([place])    From [facility] form  . Chronic respiratory failure with hypoxia ([place])   . COPD (chronic obstructive pulmonary disease) ([place])   . Dermatitis   . Dermatitis   . Dysrhythmia   . Essential tremor   . Fever   . History of ESBL E. coli infection   . Hyperlipidemia   . Hypothyroidism   . Thrombocytopenia ([place])   . Unspecified hydronephrosis   . Urinary incontinence    History reviewed. No pertinent surgical  history.  Home Medications:   (Not in a hospital admission) Allergies:  Allergies  Allergen Reactions  . Penicillins Hives, Rash and Other (See Comments)    Not effective per patient. Pt does not know. She states it was when she was a child. Unknown    History reviewed. No pertinent family history. Social History:  reports that she has been smoking.  She has never used smokeless tobacco. She reports that she drank alcohol. She reports that she has current or past drug history.  ROS: A complete review of systems was performed.  All systems are negative except for pertinent findings as noted. ROS   Physical Exam:  Vital signs in last 24 hours: Temp:  [99.7 F (37.6 C)-103.4 F (39.7 C)] 99.7 F (37.6 C) (05/07 2147) Pulse Rate:  [84-98] 84 (05/07 2130) Resp:  [22-29] 26 (05/07 2130) BP: (96-115)/(50-77) 96/57 (05/07 2130) SpO2:  [92 %-96 %] 95 % (05/07 2130) Weight:  [87.5 kg (193 lb)] 87.5 kg (193 lb) (05/07 1912) General:  Alert and oriented, No acute distress HEENT: Normocephalic, atraumatic Neck: No JVD or lymphadenopathy Cardiovascular: Regular rate and rhythm, slightly hypotensive Lungs: Regular rate and effort Abdomen: Soft, nontender, nondistended, no abdominal masses Back: No CVA tenderness Extremities: No edema Neurologic: Grossly intact  Laboratory Data:  Results for orders placed or performed during the hospital encounter of 09/07/17 (from the past 24 hour(s))  Urinalysis, Routine w reflex microscopic     Status: Abnormal   Collection Time: 09/07/17  6:57 PM  Result Value Ref Range  Color, Urine YELLOW YELLOW   APPearance CLEAR CLEAR   Specific Gravity, Urine 1.014 1.005 - 1.030   pH 6.0 5.0 - 8.0   Glucose, UA NEGATIVE NEGATIVE mg/dL   Hgb urine dipstick MODERATE (A) NEGATIVE   Bilirubin Urine NEGATIVE NEGATIVE   Ketones, ur NEGATIVE NEGATIVE mg/dL   Protein, ur 100 (A) NEGATIVE mg/dL   Nitrite NEGATIVE NEGATIVE   Leukocytes, UA TRACE (A) NEGATIVE    RBC / HPF 6-10 0 - 5 RBC/hpf   WBC, UA 11-20 0 - 5 WBC/hpf   Bacteria, UA RARE (A) NONE SEEN   Squamous Epithelial / LPF 0-5 0 - 5  Comprehensive metabolic panel     Status: Abnormal   Collection Time: 09/07/17  7:06 PM  Result Value Ref Range   Sodium 138 135 - 145 mmol/L   Potassium 4.5 3.5 - 5.1 mmol/L   Chloride 100 (L) 101 - 111 mmol/L   CO2 27 22 - 32 mmol/L   Glucose, Bld 154 (H) 65 - 99 mg/dL   BUN 23 (H) 6 - 20 mg/dL   Creatinine, Ser 1.03 (H) 0.44 - 1.00 mg/dL   Calcium 8.7 (L) 8.9 - 10.3 mg/dL   Total Protein 7.5 6.5 - 8.1 g/dL   Albumin 2.9 (L) 3.5 - 5.0 g/dL   AST 21 15 - 41 U/L   ALT 22 14 - 54 U/L   Alkaline Phosphatase 56 38 - 126 U/L   Total Bilirubin 0.9 0.3 - 1.2 mg/dL   GFR calc non Af Amer 55 (L) >60 mL/min   GFR calc Af Amer >60 >60 mL/min   Anion gap 11 5 - 15  CBC WITH DIFFERENTIAL     Status: Abnormal   Collection Time: 09/07/17  7:06 PM  Result Value Ref Range   WBC 5.7 4.0 - 10.5 K/uL   RBC 3.24 (L) 3.87 - 5.11 MIL/uL   Hemoglobin 9.4 (L) 12.0 - 15.0 g/dL   HCT 30.0 (L) 36.0 - 46.0 %   MCV 92.6 78.0 - 100.0 fL   MCH 29.0 26.0 - 34.0 pg   MCHC 31.3 30.0 - 36.0 g/dL   RDW 13.2 11.5 - 15.5 %   Platelets 255 150 - 400 K/uL   Neutrophils Relative % 60 %   Neutro Abs 3.4 1.7 - 7.7 K/uL   Lymphocytes Relative 20 %   Lymphs Abs 1.2 0.7 - 4.0 K/uL   Monocytes Relative 19 %   Monocytes Absolute 1.1 (H) 0.1 - 1.0 K/uL   Eosinophils Relative 0 %   Eosinophils Absolute 0.0 0.0 - 0.7 K/uL   Basophils Relative 1 %   Basophils Absolute 0.0 0.0 - 0.1 K/uL  Blood Culture (routine x 2)     Status: None (Preliminary result)   Collection Time: 09/07/17  7:07 PM  Result Value Ref Range   Specimen Description BLOOD RIGHT FOREARM    Special Requests      BOTTLES DRAWN AEROBIC AND ANAEROBIC Blood Culture adequate volume Performed at [facility], [address], [place], [place] [address]    Culture PENDING    Report Status PENDING   Blood Culture (routine  x 2)     Status: None (Preliminary result)   Collection Time: 09/07/17  7:11 PM  Result Value Ref Range   Specimen Description BLOOD LEFT FOREARM    Special Requests      BOTTLES DRAWN AEROBIC AND ANAEROBIC Blood Culture adequate volume Performed at [facility], [address], [place], [place]  [address]    Culture PENDING    Report Status PENDING   I-Stat CG4 Lactic Acid, ED  (not at  [facility])     Status: None   Collection Time: 09/07/17  7:13 PM  Result Value Ref Range   Lactic Acid, Venous 1.00 0.5 - 1.9 mmol/L  Troponin I     Status: None   Collection Time: 09/07/17  7:25 PM  Result Value Ref Range   Troponin I <0.03 <0.03 ng/mL   Recent Results (from the past 240 hour(s))  Culture, blood (routine x 2)     Status: None (Preliminary result)   Collection Time: 09/07/17 12:26 AM  Result Value Ref Range Status   Specimen Description LEFT ANTECUBITAL DRAWN BY RN  Final   Special Requests   Final    BOTTLES DRAWN AEROBIC AND ANAEROBIC Blood Culture adequate volume   Culture   Final    NO GROWTH < 24 HOURS Performed at [facility], [address] [place], [place] [address]    Report Status PENDING  Incomplete  Culture, blood (routine x 2)     Status: None (Preliminary result)   Collection Time: 09/07/17 12:30 AM  Result Value Ref Range Status   Specimen Description RIGHT ANTECUBITAL DRAWN BY RN  Final   Special Requests   Final    BOTTLES DRAWN AEROBIC AND ANAEROBIC Blood Culture adequate volume   Culture   Final    NO GROWTH < 24 HOURS Performed at [facility], [address], [place], [place] [address]    Report Status PENDING  Incomplete  Blood Culture (routine x 2)     Status: None (Preliminary result)   Collection Time: 09/07/17  7:07 PM  Result Value Ref Range Status   Specimen Description BLOOD RIGHT FOREARM  Final   Special Requests   Final    BOTTLES DRAWN AEROBIC AND ANAEROBIC Blood Culture adequate volume Performed at [facility], [address]  [place], [place] [address]    Culture PENDING  Incomplete   Report Status PENDING  Incomplete  Blood Culture (routine x 2)     Status: None (Preliminary result)   Collection Time: 09/07/17  7:11 PM  Result Value Ref Range Status   Specimen Description BLOOD LEFT FOREARM  Final   Special Requests   Final    BOTTLES DRAWN AEROBIC AND ANAEROBIC Blood Culture adequate volume Performed at 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  She understands that this may potentially not be the cause of her fever and infection but this will help [place] rule this in or out.  I requested that our hospitalist evaluate the patient for admission for assistance in her care and medical treatment.  I appreciate the assistance.  Marton Redwood, III 09/07/2017, 10:41 PM

## 2017-09-07 NOTE — Anesthesia Preprocedure Evaluation (Deleted)
Anesthesia Evaluation  Patient identified by MRN, date of birth, ID band Patient awake    Reviewed: Allergy & Precautions, NPO status , Patient's Chart, lab work & pertinent test results  Airway Mallampati: II  TM Distance: >3 FB Neck ROM: Full    Dental no notable dental hx.    Pulmonary COPD, Current Smoker,    Pulmonary exam normal breath sounds clear to auscultation       Cardiovascular hypertension, Pt. on home beta blockers Normal cardiovascular exam Rhythm:Regular Rate:Normal     Neuro/Psych negative neurological ROS     GI/Hepatic negative GI ROS, Neg liver ROS,   Endo/Other  negative endocrine ROSHypothyroidism Morbid obesity  Renal/GU   negative genitourinary   Musculoskeletal   Abdominal   Peds negative pediatric ROS (+)  Hematology negative hematology ROS (+)   Anesthesia Other Findings   Reproductive/Obstetrics                             Lab Results  Component Value Date   WBC 6.1 09/07/2017   HGB 9.8 (L) 09/07/2017   HCT 30.7 (L) 09/07/2017   MCV 93.0 09/07/2017   PLT 268 09/07/2017   Lab Results  Component Value Date   CREATININE 0.79 09/07/2017   BUN 18 09/07/2017   NA 137 09/07/2017   K 5.0 09/07/2017   CL 99 (L) 09/07/2017   CO2 28 09/07/2017     Anesthesia Physical Anesthesia Plan  ASA: III  Anesthesia Plan: General   Post-op Pain Management:    Induction: Intravenous  PONV Risk Score and Plan: Treatment may vary due to age or medical condition  Airway Management Planned: LMA  Additional Equipment:   Intra-op Plan:   Post-operative Plan:   Informed Consent: I have reviewed the patients History and Physical, chart, labs and discussed the procedure including the risks, benefits and alternatives for the proposed anesthesia with the patient or authorized representative who has indicated his/her understanding and acceptance.   Dental advisory  given  Plan Discussed with: CRNA  Anesthesia Plan Comments:         Anesthesia Quick Evaluation

## 2017-09-07 NOTE — ED Notes (Signed)
Notified OR that patient is ready for surgery.

## 2017-09-07 NOTE — Op Note (Signed)
Operative Note  Preoperative diagnosis:  1.  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  Cystoscopy with left retrograde pyelogram, left ureteral catheterization with aspiration of 350 cc of pus for culture, and left ureteral stent placement  Surgeon: Link Snuffer, MD  Assistants: None  Anesthesia: General  Complications: None immediate  EBL: Minimal  Specimens: 1.  None  Drains/Catheters: 1.  6 X 24 double-J ureteral stent  Intraoperative findings: 1.  Normal urethra and bladder 2.  Left retrograde pyelogram revealed a filling defect at the level of the ureteropelvic junction.  There was severe hydronephrosis. 3.  Ureteral catheterization with aspiration evacuated approximately 350 cc of pus that was sent for culture.  Indication: 67 year old female recently admitted for pyelonephritis who was placed on IV antibiotics.  She was subsequently discharged with IV antibiotics after being found to have multidrug-resistant ES BL.  CT scan showed chronic left ureteropelvic junction obstruction with possible superimposed pyelonephritis back on 08/21/2017.  After being discharged, she continues to experience cyclical fevers.  She was seen in the office today and was stable and set up for stenting in the morning.  However, she became febrile and presented to [facility] and was subsequently transferred down here to [place] long to undergo urgent ureteral stent placement.  Description of procedure:  The patient was identified and consent was obtained.  The patient was taken to the operating room and placed in the supine position.  The patient was placed under general anesthesia.  Perioperative antibiotics were administered.  The patient was placed in dorsal lithotomy.  Patient was prepped and draped in a standard sterile fashion and a timeout was  performed.  A 21 French rigid cystoscope was advanced into the urethra and into the bladder.  The left distal most portion of the ureter was cannulated with an open-ended ureteral catheter.  Retrograde pyelogram was performed with the findings noted above.  A sensor wire was then advanced up to the kidney under fluoroscopic guidance.  The open-ended ureteral catheter was advanced into the renal pelvis.  I subsequently aspirated at least 350 cc of purulent thick fluid.  I readvanced the wire into the kidney and withdrew the open-ended ureteral catheter.  A 6 X 24 double-J ureteral stent was advanced up to the kidney under fluoroscopic guidance over the wire.  The wire was withdrawn and fluoroscopy confirmed good proximal placement and direct visualization confirmed a good coil within the bladder.  The bladder was drained and the scope withdrawn.  Foley catheter was placed.  This concluded the operation.  Patient tolerated procedure well and was stable postoperatively.  Plan: The patient will need to continue on antibiotics until culture returns.  I will plan to have her return in 2 weeks or so.  She will need a renal scan with Lasix to evaluate renal function once she recovers from this.  This will determine the best next course of action whether it be pyeloplasty or nephrectomy.

## 2017-09-07 NOTE — Anesthesia Procedure Notes (Signed)
Procedure Name: Intubation Date/Time: 09/07/2017 11:21 PM Performed by: Jatavia Keltner D, CRNA Pre-anesthesia Checklist: Patient identified, Emergency Drugs available, Suction available and Patient being monitored Patient Re-evaluated:Patient Re-evaluated prior to induction Oxygen Delivery Method: Circle system utilized Preoxygenation: Pre-oxygenation with 100% oxygen Induction Type: IV induction, Rapid sequence and Cricoid Pressure applied Laryngoscope Size: Mac and 3 Grade View: Grade I Tube type: Oral Tube size: 7.5 mm Number of attempts: 1 Airway Equipment and Method: Stylet Placement Confirmation: ETT inserted through vocal cords under direct vision,  positive ETCO2 and breath sounds checked- equal and bilateral Tube secured with: Tape Dental Injury: Teeth and Oropharynx as per pre-operative assessment

## 2017-09-07 NOTE — ED Notes (Signed)
Dr. Regenia Skeeter at bedside and Dr. Gloriann Loan at bedside.

## 2017-09-07 NOTE — ED Notes (Addendum)
Pt 84% on RA upon arrival.  Pt placed on 2L Brush.

## 2017-09-07 NOTE — ED Triage Notes (Signed)
Pt brought in by [facility] for c/o fever and vomiting x 5 days; pt is from [facility]

## 2017-09-07 NOTE — ED Notes (Signed)
Patient was received by [facility] from [facility]. Per [facility], patient was scheduled for Cystoscopy Left Retrograde Pyclogram Left Stent Placement. However, she was see at [facility] for fever. [facility]'s vital's are BP 108/64, O2 at 96% 2L.

## 2017-09-07 NOTE — Interval H&P Note (Signed)
History and Physical Interval Note:  09/07/2017 10:53 PM  Laurie Moreno  has presented today for surgery, with the diagnosis of left hydronephrosis, sepsis  The various methods of treatment have been discussed with the patient and family. After consideration of risks, benefits and other options for treatment, the patient has consented to  Procedure(s): CYSTOSCOPY WITH RETROGRADE PYELOGRAM/URETERAL STENT PLACEMENT (Left) as a surgical intervention .  The patient's history has been reviewed, patient examined, no change in status, stable for surgery.  I have reviewed the patient's chart and labs.  Questions were answered to the patient's satisfaction.     Marton Redwood, III

## 2017-09-07 NOTE — ED Provider Notes (Signed)
10:36 PM Patient transferred from [facility].  Blood pressure is soft at 98/57.  However she is mentating well.  She will be given IV meropenem as she has ESBL and was treated with this just a couple weeks ago.  Dr. Gloriann Loan down here and will take her to the OR immediately.  Asks for hospitalist to admit.  Dr Jonelle Sidle to admit   Sherwood Gambler, MD 09/07/17 2245

## 2017-09-07 NOTE — Progress Notes (Signed)
Preop instructions for:  Laurie Moreno                        Date of Birth  Apr 06, 1951                           Date of Procedure: 09/08/2017        Doctor: Dr Link Snuffer  Time to arrive at [facility]: [id number] Report to: Admitting  Procedure: Any procedure time changes, MD office will notify you!   Do not eat or drink past midnight the night before your procedure.(To include any tube feedings-must be discontinued) Reminder:Follow bowel prep instructions per MD office!   Take these morning medications only with sips of water.(or give through gastrostomy or feeding tube). Use Inhalers as usual and send with patient, Carvedilol ( Coreg), Clonazepam ( Klonopin), Depakote, Lexapro, Gabapentin ( Neurontin), Nebulizer if needed, Synthroid, Claritin,   p   Facility contact:  [place]                 Phone:  [place]:  Transportation contact phone#: Sister- Sharmon Revere- [phone] Please send day of procedure:current med list and meds last taken that day, confirm nothing by mouth status from what time, Patient Demographic info( to include DNR status, problem list, allergies)   RN contact name/phone#:   RN                            and Fax #:  [phone]  Bring Insurance card and picture ID Leave all jewelry and other valuables at place where living( no metal or rings to be worn) No contact lens Women-no make-up, no lotions,perfumes,powders   Any questions day of procedure,call Short Stay at [phone] !   Sent from :[facility]                   [place]                   [phone]  Sent by :Gillian Shields RN

## 2017-09-07 NOTE — ED Provider Notes (Signed)
[facility] EMERGENCY DEPARTMENT Provider Note   CSN: [id number] Arrival date & time: 09/07/17  1844     History   Chief Complaint Chief Complaint  Patient presents with  . Fever    HPI Laurie Moreno is a 67 y.o. female.  HPI  Pt was seen at [facility]. Per NH, EMS and pt: Pt c/o gradual onset and persistence of constant "fevers" for the past 4 days. Has been associated with 1 episode of N/V today PTA. 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 Denies abd pain, no diarrhea, no black or blood in emesis, no cough/SOB, no CP/palpitations.   Past Medical History:  Diagnosis Date  . Acute pyelonephritis   . Anxiety   . Bipolar disorder, unspecified ([place])    From [facility] form  . Chronic respiratory failure with hypoxia ([place])   . COPD (chronic obstructive pulmonary disease) ([place])   . Dermatitis   . Dermatitis   . Dysrhythmia   . Essential tremor   . Fever   . History of ESBL E. coli infection   . Hyperlipidemia   . Hypothyroidism   . Thrombocytopenia ([place])   . Unspecified hydronephrosis   . Urinary incontinence     Patient Active Problem List   Diagnosis Date Noted  . Low grade fever 09/04/2017  . Urinary tract infection due to extended-spectrum beta lactamase (ESBL) producing Escherichia coli 08/25/2017  . Pyelonephritis 08/21/2017  . COPD (chronic obstructive pulmonary disease) ([place]) 08/21/2017  . Thrombocytopenia ([place]) 08/21/2017  . Ureteropelvic junction (UPJ) obstruction, left   . HCAP (healthcare-associated pneumonia) 08/07/2017  . Bipolar 1 disorder ([place]) 08/07/2017  . Essential tremor 08/07/2017  . Acute on chronic respiratory failure with hypoxia ([place]) 08/07/2017  . Altered mental status 08/06/2017    No past surgical history on file.   OB History   None      Home Medications    Prior to Admission  medications   Medication Sig Start Date End Date Taking? Authorizing Provider  acetaminophen (TYLENOL) 325 MG tablet Take 650 mg by mouth every 6 (six) hours as needed (for temperatures greater than 101. Obtain blood cultures twice for any temperature greater than 101 and notify provider, per [facility]).     [provider]  albuterol (PROVENTIL HFA;VENTOLIN HFA) 108 (90 Base) MCG/ACT inhaler Inhale 1 puff into the lungs every 6 (six) hours as needed for wheezing or shortness of breath.     [provider]  aspirin 81 MG chewable tablet Chew 81 mg by mouth every morning.     [provider]  atorvastatin (LIPITOR) 40 MG tablet Take 40 mg by mouth at bedtime.     [provider]  carvedilol (COREG) 3.125 MG tablet Take 3.125 mg by mouth 2 (two) times daily with a meal.    [provider]  clonazePAM (KLONOPIN) 0.5 MG tablet Take 1 tablet (0.5 mg total) by mouth 2 (two) times daily. 08/27/17   Granville Lewis C, PA-C  divalproex (DEPAKOTE) 250 MG DR tablet Take 250 mg by mouth 3 (three) times daily. Do not crush    [provider]  docusate sodium (COLACE) 100 MG capsule Take 100 mg by mouth daily as needed for mild constipation.     [provider]  escitalopram (LEXAPRO) 10 MG tablet Take 10 mg by mouth every morning.  [provider]  gabapentin (NEURONTIN) 300 MG capsule Take 300 mg by mouth 4 (four) times daily.    [provider]  ipratropium-albuterol (DUONEB) 0.5-2.5 (3) MG/3ML SOLN Take 3 mLs by nebulization every 6 (six) hours as needed (Wheezing/Shortness of Breath).     [provider]  levothyroxine (SYNTHROID, LEVOTHROID) 125 MCG tablet Take 125 mcg by mouth daily before breakfast.    [provider]  loratadine (CLARITIN) 10 MG tablet Take 10 mg by mouth every morning.     [provider]  Multiple Vitamin (MULTIVITAMIN) tablet Take 1 tablet by mouth every morning.     [provider]  primidone (MYSOLINE) 50 MG tablet Take 50 mg by mouth at bedtime.     [provider]    Family History No family history on file.  Social History Social History   Tobacco Use  . Smoking status: Current Some Day Smoker  . Smokeless tobacco: Never Used  Substance Use Topics  . Alcohol use: Not Currently  . Drug use: Not Currently     Allergies   Penicillins   Review of Systems Review of Systems ROS: Statement: All systems negative except as marked or noted in the HPI; Constitutional: +fever and chills. ; ; Eyes: Negative for eye pain, redness and discharge. ; ; ENMT: Negative for ear pain, hoarseness, nasal congestion, sinus pressure and sore throat. ; ; Cardiovascular: Negative for chest pain, palpitations, diaphoresis, dyspnea and peripheral edema. ; ; Respiratory: Negative for cough, wheezing and stridor. ; ; Gastrointestinal: +N/V. Negative for diarrhea, abdominal pain, blood in stool, hematemesis, jaundice and rectal bleeding. . ; ; Genitourinary: Negative for dysuria, flank pain and hematuria. ; ; Musculoskeletal: Negative for back pain and neck pain. Negative for swelling and trauma.; ; Skin: Negative for pruritus, rash, abrasions, blisters, bruising and skin lesion.; ; Neuro: Negative for headache, lightheadedness and neck stiffness. Negative for weakness, altered level of consciousness, altered mental status, extremity weakness, paresthesias, involuntary movement, seizure and syncope.       Physical Exam Updated Vital Signs BP 115/72 (BP Location: Right Arm)   Pulse 98   Temp (!) 103.4 F (39.7 C) (Oral)   Resp (!) 22   Ht 5\' 3"  (1.6 m)   Wt 87.5 kg (193 lb)   SpO2 93%   BMI 34.19 kg/m    Patient Vitals for the past 24 hrs:  BP Temp Temp src Pulse Resp SpO2 Height Weight  09/07/17 2042 - 100.1 F (37.8 C) Oral - - - - -  09/07/17 2030 (!) 104/59 - - 88 (!) 29 94 % - -  09/07/17 2009 - (!) 100.8 F (38.2 C) Oral - - - - -  09/07/17  2000 (!) 106/58 - - 93 (!) 27 93 % - -  09/07/17 1930 108/63 - - 97 (!) 26 94 % - -  09/07/17 1914 115/72 (!) 103.4 F (39.7 C) Oral 98 (!) 22 93 % - -  09/07/17 1912 - - - - - - 5\' 3"  (1.6 m) 87.5 kg (193 lb)  09/07/17 1900 113/77 - - 98 (!) 28 92 % - -     Physical Exam 1900: Physical examination:  Nursing notes reviewed; Vital signs and O2 SAT reviewed;  Constitutional: Well developed, Well nourished, In no acute distress; Head:  Normocephalic, atraumatic; Eyes: EOMI, PERRL, No scleral icterus; ENMT: Mouth and pharynx normal, Mucous membranes dry; Neck: Supple, Full range of motion, No lymphadenopathy; Cardiovascular: Regular rate and rhythm,  No gallop; Respiratory: Breath sounds coarse & equal bilaterally, No wheezes.  Speaking full sentences with ease, Normal respiratory effort/excursion; Chest: Nontender, Movement normal; Abdomen: Soft, Nontender, Nondistended, Normal bowel sounds; Genitourinary: No CVA tenderness; Extremities: Peripheral pulses normal, No tenderness, No edema, No calf edema or asymmetry.; Neuro: AA&Ox3, Major CN grossly intact.  Speech clear. Moves all extremities on stretcher..; Skin: Color normal, Warm, Dry.   ED Treatments / Results  Labs (all labs ordered are listed, but only abnormal results are displayed)   EKG None  Radiology   Procedures Procedures (including critical care time)  Medications Ordered in ED Medications  0.9 %  sodium chloride infusion (1,000 mLs Intravenous New Bag/Given 09/07/17 1929)  levofloxacin (LEVAQUIN) IVPB 750 mg (750 mg Intravenous New Bag/Given 09/07/17 1929)  aztreonam (AZACTAM) 2 g in sodium chloride 0.9 % 100 mL IVPB (2 g Intravenous New Bag/Given 09/07/17 1929)  vancomycin (VANCOCIN) IVPB 1000 mg/200 mL premix (has no administration in time range)  ondansetron (ZOFRAN) injection 4 mg (has no administration in time range)  acetaminophen (TYLENOL) suppository 650 mg (650 mg Rectal Given 09/07/17 1859)     Initial Impression /  Assessment and Plan / ED Course  I have reviewed the triage vital signs and the nursing notes.  Pertinent labs & imaging results that were available during my care of the patient were reviewed by me and considered in my medical decision making (see chart for details).  MDM Reviewed: previous chart, nursing note and vitals Reviewed previous: labs, ECG and CT scan Interpretation: labs, ECG and x-ray Total time providing critical care: 30-74 minutes. This excludes time spent performing separately reportable procedures and services. Consults: admitting MD and urology   CRITICAL CARE Performed by: Alfonzo Feller Total critical care time: 35 minutes Critical care time was exclusive of separately billable procedures and treating other patients. Critical care was necessary to treat or prevent imminent or life-threatening deterioration. Critical care was time spent personally by me on the following activities: development of treatment plan with patient and/or surrogate as well as nursing, discussions with consultants, evaluation of patient's response to treatment, examination of patient, obtaining history from patient or surrogate, ordering and performing treatments and interventions, ordering and review of laboratory studies, ordering and review of radiographic studies, pulse oximetry and re-evaluation of patient's condition.    ED ECG REPORT   Date: 09/07/2017  Rate: 101  Rhythm: sinus tachycardia  QRS Axis: left  Intervals: normal  ST/T Wave abnormalities: normal  Conduction Disutrbances:none  Narrative Interpretation:   Old EKG Reviewed: unchanged; no significant changes from previous EKG dated 08/21/2017. I have personally reviewed the EKG tracing and agree with the computerized printout as noted.   Results for orders placed or performed during the hospital encounter of 09/07/17  Comprehensive metabolic panel  Result Value Ref Range   Sodium 138 135 - 145 mmol/L   Potassium 4.5  3.5 - 5.1 mmol/L   Chloride 100 (L) 101 - 111 mmol/L   CO2 27 22 - 32 mmol/L   Glucose, Bld 154 (H) 65 - 99 mg/dL   BUN 23 (H) 6 - 20 mg/dL   Creatinine, Ser 1.03 (H) 0.44 - 1.00 mg/dL   Calcium 8.7 (L) 8.9 - 10.3 mg/dL   Total Protein 7.5 6.5 - 8.1 g/dL   Albumin 2.9 (L) 3.5 - 5.0 g/dL   AST 21 15 - 41 U/L   ALT 22 14 - 54 U/L   Alkaline Phosphatase 56 38 - 126 U/L  Total Bilirubin 0.9 0.3 - 1.2 mg/dL   GFR calc non Af Amer 55 (L) >60 mL/min   GFR calc Af Amer >60 >60 mL/min   Anion gap 11 5 - 15  CBC WITH DIFFERENTIAL  Result Value Ref Range   WBC 5.7 4.0 - 10.5 K/uL   RBC 3.24 (L) 3.87 - 5.11 MIL/uL   Hemoglobin 9.4 (L) 12.0 - 15.0 g/dL   HCT 30.0 (L) 36.0 - 46.0 %   MCV 92.6 78.0 - 100.0 fL   MCH 29.0 26.0 - 34.0 pg   MCHC 31.3 30.0 - 36.0 g/dL   RDW 13.2 11.5 - 15.5 %   Platelets 255 150 - 400 K/uL   Neutrophils Relative % 60 %   Neutro Abs 3.4 1.7 - 7.7 K/uL   Lymphocytes Relative 20 %   Lymphs Abs 1.2 0.7 - 4.0 K/uL   Monocytes Relative 19 %   Monocytes Absolute 1.1 (H) 0.1 - 1.0 K/uL   Eosinophils Relative 0 %   Eosinophils Absolute 0.0 0.0 - 0.7 K/uL   Basophils Relative 1 %   Basophils Absolute 0.0 0.0 - 0.1 K/uL  Urinalysis, Routine w reflex microscopic  Result Value Ref Range   Color, Urine YELLOW YELLOW   APPearance CLEAR CLEAR   Specific Gravity, Urine 1.014 1.005 - 1.030   pH 6.0 5.0 - 8.0   Glucose, UA NEGATIVE NEGATIVE mg/dL   Hgb urine dipstick MODERATE (A) NEGATIVE   Bilirubin Urine NEGATIVE NEGATIVE   Ketones, ur NEGATIVE NEGATIVE mg/dL   Protein, ur 100 (A) NEGATIVE mg/dL   Nitrite NEGATIVE NEGATIVE   Leukocytes, UA TRACE (A) NEGATIVE   RBC / HPF 6-10 0 - 5 RBC/hpf   WBC, UA 11-20 0 - 5 WBC/hpf   Bacteria, UA RARE (A) NONE SEEN   Squamous Epithelial / LPF 0-5 0 - 5  Troponin I  Result Value Ref Range   Troponin I <0.03 <0.03 ng/mL  I-Stat CG4 Lactic Acid, ED  (not at  [facility])  Result Value Ref Range   Lactic Acid, Venous 1.00 0.5 - 1.9  mmol/L    Ct Abdomen Pelvis W Contrast Result Date: 08/21/2017 CLINICAL DATA:  Abdominal pain EXAM: CT ABDOMEN AND PELVIS WITH CONTRAST TECHNIQUE: Multidetector CT imaging of the abdomen and pelvis was performed using the standard protocol following bolus administration of intravenous contrast. CONTRAST:  172mL ISOVUE-300 IOPAMIDOL (ISOVUE-300) INJECTION 61% COMPARISON:  08/12/2015 FINDINGS: Lower chest: Lung bases are well aerated with stable right lower lobe nodule when compared with the prior exam. Hepatobiliary: Liver demonstrates some nodularity consistent with mild underlying cirrhosis. No focal mass is seen. Gallbladder is within normal limits. Pancreas: Unremarkable. No pancreatic ductal dilatation or surrounding inflammatory changes. Spleen: Normal in size without focal abnormality. Adrenals/Urinary Tract: Adrenal glands are within normal limits. Kidneys are well visualized bilaterally. The right kidney demonstrates no renal calculi or obstructive change. The left kidney demonstrates considerable perinephric stranding as well as hydronephrosis without evidence of significant hydroureter. This is consistent with a ureteropelvic junction obstruction and is similar to that seen on the prior examination. The perinephric changes likely represent a component of pyelonephritis. Some mottled enhancement is noted in the upper pole on the left. Some slight increased attenuation in the distal ureter on the left is noted. Again no stone is identified. Stomach/Bowel: The appendix is not well visualized. No obstructive or inflammatory changes are seen. Vascular/Lymphatic: No significant vascular findings are present. No enlarged abdominal or pelvic lymph nodes. Reproductive: Scattered small  fibroids are noted. Other: No abdominal wall hernia or abnormality. No abdominopelvic ascites. Musculoskeletal: Degenerative changes of lumbar spine are noted. IMPRESSION: Changes consistent with left UPJ obstruction. Associated  inflammatory changes and mottled enhancement in the left kidney are noted suggestive of pyelonephritis. Mild cirrhotic change Uterine fibroids. Electronically Signed   By: Inez Catalina M.D.   On: 08/21/2017 19:08   Dg Chest Port 1 View Result Date: 09/07/2017 CLINICAL DATA:  67 year old female with fever for 2 days.  Hypoxia. EXAM: PORTABLE CHEST 1 VIEW COMPARISON:  Portable chest 08/25/2017 and earlier. FINDINGS: Portable AP upright view at 1905 hours. Lower lung volumes and persistent lordotic positioning. Stable mild cardiomegaly. Other mediastinal contours are within normal limits. Visualized tracheal air column is within normal limits. Allowing for portable technique the lungs are clear. Paucity of bowel gas in the upper abdomen. No acute osseous abnormality identified. IMPRESSION: Low lung volumes.  No acute cardiopulmonary abnormality. Electronically Signed   By: Genevie Ann M.D.   On: 09/07/2017 19:38    2040:  Code Sepsis called on pt's arrival. APAP given for fever with improvement. IV abx started after UC and BC obtained. Pt remains A&O, resps without distress. No N/V while in the ED. Pt concerned "about my procedure tomorrow." Pt reassured that I was about to speak with Uro MD. Judicious IVF given for soft BP.   2050:  T/C returned from Uro Dr. Gloriann Loan, case discussed, including:  HPI, pertinent PM/SHx, VS/PE, dx testing, ED course and treatment:  States he just saw pt in his office today, BP was soft then, states he needs to place stent tonight, requests to transfer to [facility] ED where he will evaluate pt and consult [facility] to admit. Dx and testing, as well as d/w Uro MD, d/w pt.  Questions answered.  Verb understanding, agreeable to transfer/admit to [facility]. [facility] ED[facility] Dr. Regenia Skeeter called with report.    Final Clinical Impressions(s) / ED Diagnoses   Final diagnoses:  None    ED Discharge Orders    None       Francine Graven, DO 09/08/17 2035

## 2017-09-07 NOTE — Anesthesia Preprocedure Evaluation (Addendum)
Anesthesia Evaluation  Patient identified by MRN, date of birth, ID band Patient awake    Reviewed: Allergy & Precautions, NPO status , Patient's Chart, lab work & pertinent test results, reviewed documented beta blocker date and time   Airway Mallampati: III  TM Distance: >3 FB Neck ROM: Full    Dental  (+) Dental Advisory Given, Edentulous Lower   Pulmonary COPD, Current Smoker,  Hx chronic respiratory failure   Pulmonary exam normal breath sounds clear to auscultation       Cardiovascular hypertension, Pt. on home beta blockers and Pt. on medications Normal cardiovascular exam+ dysrhythmias  Rhythm:Regular Rate:Normal  EKG - ST, LAFB   Neuro/Psych Anxiety Bipolar Disorder negative neurological ROS     GI/Hepatic negative GI ROS, Neg liver ROS,   Endo/Other  Hypothyroidism Obesity  Renal/GU Renal InsufficiencyRenal diseaseLeft hydronephrosis  negative genitourinary   Musculoskeletal negative musculoskeletal ROS (+)   Abdominal   Peds negative pediatric ROS (+)  Hematology  (+) anemia ,   Anesthesia Other Findings   Reproductive/Obstetrics                            Anesthesia Physical Anesthesia Plan  ASA: III and emergent  Anesthesia Plan: General   Post-op Pain Management:    Induction: Intravenous, Rapid sequence and Cricoid pressure planned  PONV Risk Score and Plan: 3 and Treatment may vary due to age or medical condition, Ondansetron, Dexamethasone and Midazolam  Airway Management Planned: Oral ETT  Additional Equipment: None  Intra-op Plan:   Post-operative Plan: Extubation in OR  Informed Consent: I have reviewed the patients History and Physical, chart, labs and discussed the procedure including the risks, benefits and alternatives for the proposed anesthesia with the patient or authorized representative who has indicated his/her understanding and acceptance.    Dental advisory given  Plan Discussed with: CRNA and Anesthesiologist  Anesthesia Plan Comments:        Anesthesia Quick Evaluation

## 2017-09-08 ENCOUNTER — Other Ambulatory Visit: Payer: Self-pay

## 2017-09-08 ENCOUNTER — Encounter (HOSPITAL_COMMUNITY): Payer: Self-pay | Admitting: Urology

## 2017-09-08 ENCOUNTER — Ambulatory Visit (HOSPITAL_COMMUNITY): Admission: RE | Admit: 2017-09-08 | Payer: Medicare Other | Source: Ambulatory Visit | Admitting: Urology

## 2017-09-08 ENCOUNTER — Inpatient Hospital Stay (HOSPITAL_COMMUNITY): Payer: Medicare Other

## 2017-09-08 ENCOUNTER — Encounter (HOSPITAL_COMMUNITY)
Admission: EM | Disposition: A | Discharge status: Skilled Nursing Facility | Payer: Self-pay | Source: Home / Self Care | Attending: Internal Medicine

## 2017-09-08 DIAGNOSIS — R112 Nausea with vomiting, unspecified: Secondary | ICD-10-CM | POA: Diagnosis present

## 2017-09-08 DIAGNOSIS — G250 Essential tremor: Secondary | ICD-10-CM | POA: Diagnosis present

## 2017-09-08 DIAGNOSIS — G92 Toxic encephalopathy: Secondary | ICD-10-CM | POA: Diagnosis not present

## 2017-09-08 DIAGNOSIS — J9692 Respiratory failure, unspecified with hypercapnia: Secondary | ICD-10-CM

## 2017-09-08 DIAGNOSIS — J9622 Acute and chronic respiratory failure with hypercapnia: Secondary | ICD-10-CM | POA: Diagnosis present

## 2017-09-08 DIAGNOSIS — A4151 Sepsis due to Escherichia coli [E. coli]: Secondary | ICD-10-CM | POA: Diagnosis present

## 2017-09-08 DIAGNOSIS — D649 Anemia, unspecified: Secondary | ICD-10-CM | POA: Diagnosis present

## 2017-09-08 DIAGNOSIS — E785 Hyperlipidemia, unspecified: Secondary | ICD-10-CM | POA: Diagnosis present

## 2017-09-08 DIAGNOSIS — N119 Chronic tubulo-interstitial nephritis, unspecified: Secondary | ICD-10-CM | POA: Diagnosis present

## 2017-09-08 DIAGNOSIS — J9621 Acute and chronic respiratory failure with hypoxia: Secondary | ICD-10-CM | POA: Diagnosis present

## 2017-09-08 DIAGNOSIS — N12 Tubulo-interstitial nephritis, not specified as acute or chronic: Secondary | ICD-10-CM | POA: Diagnosis not present

## 2017-09-08 DIAGNOSIS — E875 Hyperkalemia: Secondary | ICD-10-CM | POA: Diagnosis present

## 2017-09-08 DIAGNOSIS — J9602 Acute respiratory failure with hypercapnia: Secondary | ICD-10-CM | POA: Diagnosis not present

## 2017-09-08 DIAGNOSIS — N390 Urinary tract infection, site not specified: Secondary | ICD-10-CM

## 2017-09-08 DIAGNOSIS — N179 Acute kidney failure, unspecified: Secondary | ICD-10-CM | POA: Diagnosis present

## 2017-09-08 DIAGNOSIS — B9620 Unspecified Escherichia coli [E. coli] as the cause of diseases classified elsewhere: Secondary | ICD-10-CM | POA: Diagnosis present

## 2017-09-08 DIAGNOSIS — N151 Renal and perinephric abscess: Secondary | ICD-10-CM | POA: Diagnosis present

## 2017-09-08 DIAGNOSIS — F419 Anxiety disorder, unspecified: Secondary | ICD-10-CM | POA: Diagnosis present

## 2017-09-08 DIAGNOSIS — B9629 Other Escherichia coli [E. coli] as the cause of diseases classified elsewhere: Secondary | ICD-10-CM

## 2017-09-08 DIAGNOSIS — T41205A Adverse effect of unspecified general anesthetics, initial encounter: Secondary | ICD-10-CM | POA: Diagnosis not present

## 2017-09-08 DIAGNOSIS — Z1612 Extended spectrum beta lactamase (ESBL) resistance: Secondary | ICD-10-CM | POA: Diagnosis present

## 2017-09-08 DIAGNOSIS — F319 Bipolar disorder, unspecified: Secondary | ICD-10-CM | POA: Diagnosis present

## 2017-09-08 DIAGNOSIS — E039 Hypothyroidism, unspecified: Secondary | ICD-10-CM | POA: Diagnosis present

## 2017-09-08 DIAGNOSIS — J449 Chronic obstructive pulmonary disease, unspecified: Secondary | ICD-10-CM | POA: Diagnosis present

## 2017-09-08 DIAGNOSIS — I10 Essential (primary) hypertension: Secondary | ICD-10-CM | POA: Diagnosis present

## 2017-09-08 DIAGNOSIS — D259 Leiomyoma of uterus, unspecified: Secondary | ICD-10-CM | POA: Diagnosis present

## 2017-09-08 DIAGNOSIS — N136 Pyonephrosis: Secondary | ICD-10-CM | POA: Diagnosis present

## 2017-09-08 DIAGNOSIS — Y92230 Patient room in hospital as the place of occurrence of the external cause: Secondary | ICD-10-CM | POA: Diagnosis not present

## 2017-09-08 DIAGNOSIS — Z87442 Personal history of urinary calculi: Secondary | ICD-10-CM | POA: Diagnosis not present

## 2017-09-08 DIAGNOSIS — Z1624 Resistance to multiple antibiotics: Secondary | ICD-10-CM | POA: Diagnosis present

## 2017-09-08 DIAGNOSIS — F17200 Nicotine dependence, unspecified, uncomplicated: Secondary | ICD-10-CM | POA: Diagnosis present

## 2017-09-08 HISTORY — DX: Essential tremor: G25.0

## 2017-09-08 HISTORY — DX: Thrombocytopenia, unspecified: D69.6

## 2017-09-08 HISTORY — DX: Chronic respiratory failure with hypoxia: J96.11

## 2017-09-08 HISTORY — DX: Fever, unspecified: R50.9

## 2017-09-08 HISTORY — DX: Hypothyroidism, unspecified: E03.9

## 2017-09-08 HISTORY — DX: Acute pyelonephritis: N10

## 2017-09-08 HISTORY — DX: Unspecified hydronephrosis: N13.30

## 2017-09-08 HISTORY — DX: Personal history of other infectious and parasitic diseases: Z86.19

## 2017-09-08 LAB — COMPREHENSIVE METABOLIC PANEL
ALBUMIN: 2.3 g/dL — AB (ref 3.5–5.0)
ALT: 18 U/L (ref 14–54)
AST: 14 U/L — AB (ref 15–41)
Alkaline Phosphatase: 44 U/L (ref 38–126)
Anion gap: 10 (ref 5–15)
BUN: 20 mg/dL (ref 6–20)
CALCIUM: 7.9 mg/dL — AB (ref 8.9–10.3)
CO2: 23 mmol/L (ref 22–32)
Chloride: 105 mmol/L (ref 101–111)
Creatinine, Ser: 0.77 mg/dL (ref 0.44–1.00)
GFR calc Af Amer: >60 mL/min (ref >60)
GFR calc non Af Amer: >60 mL/min (ref >60)
Glucose, Bld: 235 mg/dL — ABNORMAL HIGH (ref 65–99)
POTASSIUM: 5.2 mmol/L — AB (ref 3.5–5.1)
SODIUM: 138 mmol/L (ref 135–145)
Total Bilirubin: 0.8 mg/dL (ref 0.3–1.2)
Total Protein: 6.2 g/dL — ABNORMAL LOW (ref 6.5–8.1)

## 2017-09-08 LAB — BLOOD CULTURE ID PANEL (REFLEXED)
ACINETOBACTER BAUMANNII: NOT DETECTED
CANDIDA KRUSEI: NOT DETECTED
CANDIDA PARAPSILOSIS: NOT DETECTED
CANDIDA TROPICALIS: NOT DETECTED
Candida albicans: NOT DETECTED
Candida glabrata: NOT DETECTED
ESCHERICHIA COLI: NOT DETECTED
Enterobacter cloacae complex: NOT DETECTED
Enterobacteriaceae species: NOT DETECTED
Enterococcus species: NOT DETECTED
HAEMOPHILUS INFLUENZAE: NOT DETECTED
KLEBSIELLA OXYTOCA: NOT DETECTED
KLEBSIELLA PNEUMONIAE: NOT DETECTED
Listeria monocytogenes: NOT DETECTED
Methicillin resistance: DETECTED — AB
Neisseria meningitidis: NOT DETECTED
PROTEUS SPECIES: NOT DETECTED
PSEUDOMONAS AERUGINOSA: NOT DETECTED
SERRATIA MARCESCENS: NOT DETECTED
STAPHYLOCOCCUS AUREUS BCID: NOT DETECTED
STREPTOCOCCUS PNEUMONIAE: NOT DETECTED
Staphylococcus species: DETECTED — AB
Streptococcus agalactiae: NOT DETECTED
Streptococcus pyogenes: NOT DETECTED
Streptococcus species: NOT DETECTED

## 2017-09-08 LAB — POCT I-STAT EG7
ACID-BASE DEFICIT: 2 mmol/L (ref 0.0–2.0)
BICARBONATE: 27.7 mmol/L (ref 20.0–28.0)
Calcium, Ion: 1.18 mmol/L (ref 1.15–1.40)
HCT: 29 % — ABNORMAL LOW (ref 36.0–46.0)
Hemoglobin: 9.9 g/dL — ABNORMAL LOW (ref 12.0–15.0)
O2 SAT: 98 %
PH VEN: 7.146 — AB (ref 7.250–7.430)
PO2 VEN: 143 mmHg — AB (ref 32.0–45.0)
Potassium: 5.1 mmol/L (ref 3.5–5.1)
Sodium: 139 mmol/L (ref 135–145)
TCO2: 30 mmol/L (ref 22–32)
pCO2, Ven: 80.4 mmHg (ref 44.0–60.0)

## 2017-09-08 LAB — BLOOD GAS, ARTERIAL
Acid-base deficit: 2 mmol/L (ref 0.0–2.0)
BICARBONATE: 24.3 mmol/L (ref 20.0–28.0)
Drawn by: 225631
FIO2: 40
MECHVT: 384 mL
MODE: POSITIVE
O2 Saturation: 94.6 %
PEEP: 5 cmH2O
PO2 ART: 83.1 mmHg (ref 83.0–108.0)
Patient temperature: 99.1
Pressure support: 10 cmH2O
RATE: 20 resp/min
pCO2 arterial: 54.1 mmHg — ABNORMAL HIGH (ref 32.0–48.0)
pH, Arterial: 7.277 — ABNORMAL LOW (ref 7.350–7.450)

## 2017-09-08 LAB — CBC WITH DIFFERENTIAL/PLATELET
BASOS PCT: 0 %
Basophils Absolute: 0 10*3/uL (ref 0.0–0.1)
Eosinophils Absolute: 0 10*3/uL (ref 0.0–0.7)
Eosinophils Relative: 0 %
HEMATOCRIT: 25.4 % — AB (ref 36.0–46.0)
HEMOGLOBIN: 8.2 g/dL — AB (ref 12.0–15.0)
Lymphocytes Relative: 13 %
Lymphs Abs: 0.6 10*3/uL — ABNORMAL LOW (ref 0.7–4.0)
MCH: 30.3 pg (ref 26.0–34.0)
MCHC: 32.3 g/dL (ref 30.0–36.0)
MCV: 93.7 fL (ref 78.0–100.0)
Monocytes Absolute: 0.4 10*3/uL (ref 0.1–1.0)
Monocytes Relative: 10 %
NEUTROS ABS: 3.3 10*3/uL (ref 1.7–7.7)
NEUTROS PCT: 77 %
Platelets: 215 10*3/uL (ref 150–400)
RBC: 2.71 MIL/uL — ABNORMAL LOW (ref 3.87–5.11)
RDW: 13.6 % (ref 11.5–15.5)
WBC: 4.2 10*3/uL (ref 4.0–10.5)

## 2017-09-08 LAB — CREATININE, SERUM
Creatinine, Ser: 0.75 mg/dL (ref 0.44–1.00)
GFR calc Af Amer: >60 mL/min (ref >60)
GFR calc non Af Amer: >60 mL/min (ref >60)

## 2017-09-08 LAB — GLUCOSE, CAPILLARY
Glucose-Capillary: 221 mg/dL — ABNORMAL HIGH (ref 65–99)
Glucose-Capillary: 240 mg/dL — ABNORMAL HIGH (ref 65–99)

## 2017-09-08 LAB — POCT I-STAT 4, (NA,K, GLUC, HGB,HCT)
GLUCOSE: 169 mg/dL — AB (ref 65–99)
HEMATOCRIT: 27 % — AB (ref 36.0–46.0)
HEMOGLOBIN: 9.2 g/dL — AB (ref 12.0–15.0)
POTASSIUM: 5 mmol/L (ref 3.5–5.1)
Sodium: 139 mmol/L (ref 135–145)

## 2017-09-08 LAB — MRSA PCR SCREENING: MRSA BY PCR: NEGATIVE

## 2017-09-08 SURGERY — CYSTOSCOPY, WITH RETROGRADE PYELOGRAM AND URETERAL STENT INSERTION
Anesthesia: General | Laterality: Left

## 2017-09-08 MED ORDER — ASPIRIN 81 MG PO CHEW
81.0000 mg | CHEWABLE_TABLET | Freq: Every morning | ORAL | Status: DC
  Administered 2017-09-08 – 2017-09-13 (×6): 81 mg via ORAL
  Filled 2017-09-08 (×6): qty 1

## 2017-09-08 MED ORDER — SODIUM CHLORIDE 0.9 % IV SOLN
1000.0000 mL | INTRAVENOUS | Status: DC
  Administered 2017-09-08 – 2017-09-10 (×2): 1000 mL via INTRAVENOUS

## 2017-09-08 MED ORDER — ATORVASTATIN CALCIUM 40 MG PO TABS
40.0000 mg | ORAL_TABLET | Freq: Every day | ORAL | Status: DC
  Administered 2017-09-08 – 2017-09-12 (×5): 40 mg via ORAL
  Filled 2017-09-08 (×5): qty 1

## 2017-09-08 MED ORDER — VANCOMYCIN HCL IN DEXTROSE 1-5 GM/200ML-% IV SOLN: 1000.0000 mg | INTRAVENOUS | Status: DC

## 2017-09-08 MED ORDER — MORPHINE SULFATE (PF) 4 MG/ML IV SOLN: 2.0000 mg | INTRAVENOUS | Status: DC | PRN

## 2017-09-08 MED ORDER — FENTANYL 2500MCG IN NS 250ML (10MCG/ML) PREMIX INFUSION: 25.0000 ug/h | INTRAVENOUS | Status: DC

## 2017-09-08 MED ORDER — SODIUM CHLORIDE 0.9 % IV SOLN
1.0000 g | Freq: Three times a day (TID) | INTRAVENOUS | Status: DC
  Administered 2017-09-08 – 2017-09-13 (×16): 1 g via INTRAVENOUS
  Filled 2017-09-08 (×21): qty 1

## 2017-09-08 MED ORDER — FAMOTIDINE IN NACL 20-0.9 MG/50ML-% IV SOLN
20.0000 mg | Freq: Two times a day (BID) | INTRAVENOUS | Status: DC
  Administered 2017-09-08 – 2017-09-13 (×11): 20 mg via INTRAVENOUS
  Filled 2017-09-08 (×11): qty 50

## 2017-09-08 MED ORDER — ALBUTEROL SULFATE HFA 108 (90 BASE) MCG/ACT IN AERS: 1.0000 | INHALATION_SPRAY | Freq: Four times a day (QID) | RESPIRATORY_TRACT | Status: DC | PRN

## 2017-09-08 MED ORDER — CARVEDILOL 3.125 MG PO TABS: 3.1250 mg | ORAL_TABLET | Freq: Two times a day (BID) | ORAL | Status: DC

## 2017-09-08 MED ORDER — SODIUM CHLORIDE 0.9 % IV SOLN
25.0000 ug/h | INTRAVENOUS | Status: DC
  Administered 2017-09-08: 50 ug/h via INTRAVENOUS
  Filled 2017-09-08: qty 50

## 2017-09-08 MED ORDER — ONDANSETRON HCL 4 MG/2ML IJ SOLN
INTRAMUSCULAR | Status: AC
  Filled 2017-09-08: qty 2

## 2017-09-08 MED ORDER — DOCUSATE SODIUM 100 MG PO CAPS: 100.0000 mg | ORAL_CAPSULE | Freq: Every day | ORAL | Status: DC | PRN

## 2017-09-08 MED ORDER — FENTANYL BOLUS VIA INFUSION
25.0000 ug | INTRAVENOUS | Status: DC | PRN
  Filled 2017-09-08: qty 25

## 2017-09-08 MED ORDER — FENTANYL CITRATE (PF) 100 MCG/2ML IJ SOLN
50.0000 ug | Freq: Once | INTRAMUSCULAR | Status: AC
  Administered 2017-09-08: 50 ug via INTRAVENOUS
  Filled 2017-09-08: qty 2

## 2017-09-08 MED ORDER — ADULT MULTIVITAMIN W/MINERALS CH
1.0000 | ORAL_TABLET | Freq: Every morning | ORAL | Status: DC
Start: 2017-09-08 — End: 2017-09-13
  Administered 2017-09-08 – 2017-09-13 (×6): 1 via ORAL
  Filled 2017-09-08 (×6): qty 1

## 2017-09-08 MED ORDER — GABAPENTIN 300 MG PO CAPS: 300.0000 mg | ORAL_CAPSULE | Freq: Four times a day (QID) | ORAL | Status: DC

## 2017-09-08 MED ORDER — LORATADINE 10 MG PO TABS
10.0000 mg | ORAL_TABLET | Freq: Every morning | ORAL | Status: DC
  Administered 2017-09-08 – 2017-09-13 (×6): 10 mg via ORAL
  Filled 2017-09-08 (×6): qty 1

## 2017-09-08 MED ORDER — PROPOFOL 1000 MG/100ML IV EMUL
5.0000 ug/kg/min | INTRAVENOUS | Status: DC
  Administered 2017-09-08: 5 ug/kg/min via INTRAVENOUS
  Filled 2017-09-08: qty 100

## 2017-09-08 MED ORDER — NALOXONE HCL 0.4 MG/ML IJ SOLN
INTRAMUSCULAR | Status: DC | PRN
  Administered 2017-09-07 – 2017-09-08 (×2): 40 ug via INTRAVENOUS

## 2017-09-08 MED ORDER — OXYCODONE-ACETAMINOPHEN 5-325 MG PO TABS
1.0000 | ORAL_TABLET | ORAL | Status: DC | PRN
  Administered 2017-09-08 – 2017-09-12 (×4): 1 via ORAL
  Filled 2017-09-08 (×4): qty 1

## 2017-09-08 MED ORDER — PROPOFOL 10 MG/ML IV BOLUS
INTRAVENOUS | Status: AC
  Filled 2017-09-08: qty 20

## 2017-09-08 MED ORDER — ALBUTEROL SULFATE HFA 108 (90 BASE) MCG/ACT IN AERS
INHALATION_SPRAY | RESPIRATORY_TRACT | Status: AC
  Filled 2017-09-08: qty 6.7

## 2017-09-08 MED ORDER — NALOXONE HCL 0.4 MG/ML IJ SOLN
INTRAMUSCULAR | Status: AC
  Filled 2017-09-08: qty 1

## 2017-09-08 MED ORDER — DEXAMETHASONE SODIUM PHOSPHATE 10 MG/ML IJ SOLN
INTRAMUSCULAR | Status: AC
  Filled 2017-09-08: qty 1

## 2017-09-08 MED ORDER — IPRATROPIUM-ALBUTEROL 0.5-2.5 (3) MG/3ML IN SOLN: 3.0000 mL | Freq: Four times a day (QID) | RESPIRATORY_TRACT | Status: DC | PRN

## 2017-09-08 MED ORDER — ESCITALOPRAM OXALATE 10 MG PO TABS
10.0000 mg | ORAL_TABLET | Freq: Every morning | ORAL | Status: DC
  Administered 2017-09-08 – 2017-09-13 (×6): 10 mg via ORAL
  Filled 2017-09-08 (×6): qty 1

## 2017-09-08 MED ORDER — CLONAZEPAM 0.5 MG PO TABS: 0.5000 mg | ORAL_TABLET | Freq: Two times a day (BID) | ORAL | Status: DC

## 2017-09-08 MED ORDER — LEVOTHYROXINE SODIUM 100 MCG PO TABS
125.0000 ug | ORAL_TABLET | Freq: Every day | ORAL | Status: DC
  Administered 2017-09-08 – 2017-09-13 (×6): 125 ug via ORAL
  Filled 2017-09-08 (×6): qty 1

## 2017-09-08 MED ORDER — DIVALPROEX SODIUM 250 MG PO DR TAB
250.0000 mg | DELAYED_RELEASE_TABLET | Freq: Three times a day (TID) | ORAL | Status: DC
  Administered 2017-09-08 – 2017-09-13 (×16): 250 mg via ORAL
  Filled 2017-09-08 (×17): qty 1

## 2017-09-08 MED ORDER — PRIMIDONE 50 MG PO TABS
50.0000 mg | ORAL_TABLET | Freq: Every day | ORAL | Status: DC
  Administered 2017-09-08 – 2017-09-12 (×5): 50 mg via ORAL
  Filled 2017-09-08 (×5): qty 1

## 2017-09-08 MED ORDER — ENOXAPARIN SODIUM 40 MG/0.4ML ~~LOC~~ SOLN
40.0000 mg | SUBCUTANEOUS | Status: DC
  Administered 2017-09-08 – 2017-09-12 (×5): 40 mg via SUBCUTANEOUS
  Filled 2017-09-08 (×6): qty 0.4

## 2017-09-08 MED ORDER — FENTANYL CITRATE (PF) 100 MCG/2ML IJ SOLN
INTRAMUSCULAR | Status: AC
  Filled 2017-09-08: qty 2

## 2017-09-08 MED ORDER — ACETAMINOPHEN 325 MG PO TABS
650.0000 mg | ORAL_TABLET | Freq: Four times a day (QID) | ORAL | Status: DC | PRN
Start: 2017-09-08 — End: 2017-09-13

## 2017-09-08 NOTE — Progress Notes (Signed)
Nutrition Brief Note  Patient identified for new vent.  Wt Readings from Last 15 Encounters:  09/07/17 193 lb (87.5 kg)  08/21/17 193 lb 9 oz (87.8 kg)  08/07/17 204 lb 5.9 oz (92.7 kg)    Body mass index is 34.19 kg/m. Patient meets criteria for obesity based on current BMI. Pt has lost 11 lbs (5% body weight) in the past 1 month which is borderline significant for time frame.   Pt with PMH of bipolar disorder, nephrolithiasis, COPD, and hypothyroidism. She was transferred from [facility]. Pt was hospitalized for treatment of ESBL recently (4/20-4/24) and was admitted yesterday for cystoscopy with retrograde pyelogram/ureteral stent placement. Pt intubated for procedure. Dr. Agustina Caroli note from earlier this AM states that pt will be extubated this AM.   Current diet order is NPO. Medications reviewed; 20 mg IV Pepcid BID, 125 mcg oral Synthroid/day, daily multivitamin with minerals. Labs reviewed; K: 5.2 mmol/L, Ca: 7.9 mg/dL.  IVF: NS @ 50 mL/hr.   No nutrition interventions warranted at this time. If nutrition issues arise, please consult RD.     Jarome Matin, MS, RD, LDN, [facility] Inpatient Clinical Dietitian Pager # [phone] After hours/weekend pager # [phone]

## 2017-09-08 NOTE — Progress Notes (Signed)
Pt. Transported uneventfully from PACU to ICU/SD-36, RT covering unit made aware.

## 2017-09-08 NOTE — Consult Note (Addendum)
PULMONARY / CRITICAL CARE MEDICINE   Name: Maddilynn Esperanza MRN: [id number] DOB: 01-03-1951    ADMISSION DATE:  09/07/2017 CONSULTATION DATE:  09/08/2017  REFERRING MD:  Dr. Jonelle Sidle [facility]  CHIEF COMPLAINT:  Post op vent, pyelonephritis  HISTORY OF PRESENT ILLNESS:  Patient is encephalopathic and/or intubated. Therefore history has been obtained from chart review. 67 year old female with PMH as below, which is significant for Bipolar disorder, nephrolithiasis, COPD, and hypothyroidism. She was recently admitted for ESBL E. coli pyelonephritis and treated with 7 days of meropenem and discharged to SNF. Hydronephrosis was seen on CT, but was chronic and urology recommended outpatient follow up. She was scheduled to have L ureteral stent 5/8. She then presented again to ED 5/7 with complaints of fever/chills. She was transferred to [facility] for urology evaluation. She had fever to 103F. She was seen by urology who took her to the OR for Cystoscopy with left retrograde pyelogram, left ureteral catheterization with aspiration of 350 cc of pus for culture, and left ureteral stent placement. Post operatively she remained on vent and was transferred to ICU for further care.  PAST MEDICAL HISTORY :  She  has a past medical history of Acute pyelonephritis, Anxiety, Bipolar disorder, unspecified ([place]), Chronic respiratory failure with hypoxia ([place]), COPD (chronic obstructive pulmonary disease) ([place]), Dermatitis, Dermatitis, Dysrhythmia, Essential tremor, Fever, History of ESBL E. coli infection, Hyperlipidemia, Hypothyroidism, Thrombocytopenia ([place]), Unspecified hydronephrosis, and Urinary incontinence.  PAST SURGICAL HISTORY: She  has no past surgical history on file.  Allergies  Allergen Reactions  . Penicillins Hives, Rash and Other (See Comments)    Not effective per patient. Pt does not know. She states it was when she was a child. Unknown    No current facility-administered medications on file prior to  encounter.    Current Outpatient Medications on File Prior to Encounter  Medication Sig  . acetaminophen (TYLENOL) 325 MG tablet Take 650 mg by mouth every 6 (six) hours as needed (for temperatures greater than 101. Obtain blood cultures twice for any temperature greater than 101 and notify provider, per [facility]).   Marland Kitchen albuterol (PROVENTIL HFA;VENTOLIN HFA) 108 (90 Base) MCG/ACT inhaler Inhale 1 puff into the lungs every 6 (six) hours as needed for wheezing or shortness of breath.   Marland Kitchen aspirin 81 MG chewable tablet Chew 81 mg by mouth every morning.   Marland Kitchen atorvastatin (LIPITOR) 40 MG tablet Take 40 mg by mouth at bedtime.   . carvedilol (COREG) 3.125 MG tablet Take 3.125 mg by mouth 2 (two) times daily with a meal.  . clonazePAM (KLONOPIN) 0.5 MG tablet Take 1 tablet (0.5 mg total) by mouth 2 (two) times daily.  . divalproex (DEPAKOTE) 250 MG DR tablet Take 250 mg by mouth 3 (three) times daily. Do not crush  . docusate sodium (COLACE) 100 MG capsule Take 100 mg by mouth daily as needed for mild constipation.   Marland Kitchen escitalopram (LEXAPRO) 10 MG tablet Take 10 mg by mouth every morning.   . gabapentin (NEURONTIN) 300 MG capsule Take 300 mg by mouth 4 (four) times daily.  Marland Kitchen ipratropium-albuterol (DUONEB) 0.5-2.5 (3) MG/3ML SOLN Take 3 mLs by nebulization every 6 (six) hours as needed (Wheezing/Shortness of Breath).   Marland Kitchen levothyroxine (SYNTHROID, LEVOTHROID) 125 MCG tablet Take 125 mcg by mouth daily before breakfast.  . loratadine (CLARITIN) 10 MG tablet Take 10 mg by mouth every morning.   . Multiple Vitamin (MULTIVITAMIN) tablet Take 1 tablet by mouth every morning.   Marland Kitchen  primidone (MYSOLINE) 50 MG tablet Take 50 mg by mouth at bedtime.     FAMILY HISTORY:  Her has no family status information on file.    SOCIAL HISTORY: She  reports that she has been smoking.  She has never used smokeless tobacco. She reports that she drank alcohol. She reports that she has current or past drug history.  REVIEW OF  SYSTEMS:   Unable as patient is encephalopathic  SUBJECTIVE:    VITAL SIGNS: BP (!) 98/59 (BP Location: Right Arm)   Pulse 67   Temp 98.5 F (36.9 C)   Resp 16   Ht 5\' 3"  (1.6 m)   Wt 87.5 kg (193 lb)   SpO2 93%   BMI 34.19 kg/m   HEMODYNAMICS:    VENTILATOR SETTINGS: Vent Mode: PRVC FiO2 (%):  [40 %-50 %] 40 % Set Rate:  [20 bmp] 20 bmp Vt Set:  [420 mL] 420 mL PEEP:  [5 cmH20] 5 cmH20 Pressure Support:  [10 cmH20] 10 cmH20 Plateau Pressure:  [16 cmH20] 16 cmH20  INTAKE / OUTPUT: No intake/output data recorded.  PHYSICAL EXAMINATION: General:  Intubated and sedated Neuro:  Sedated  HEENT:  ETT in oropharynx at 22 at lip Cardiovascular:  S1 and S2 appreciated  Lungs:  Decreased BS at bases Abdomen:  Soft obese,non tender,not distended Musculoskeletal:  No gross deformities Skin:  Grossly intact  LABS:  BMET Recent Labs  Lab 09/06/17 0730 09/07/17 0025 09/07/17 1906 09/08/17 0021 09/08/17 0025  NA 138 137 138 139 139  K 4.7 5.0 4.5 5.0 5.1  CL 101 99* 100*  --   --   CO2 30 28 27   --   --   BUN 19 18 23*  --   --   CREATININE 0.72 0.79 1.03*  --   --   GLUCOSE 194* 147* 154* 169*  --     Electrolytes Recent Labs  Lab 09/06/17 0730 09/07/17 0025 09/07/17 1906  CALCIUM 8.5* 8.5* 8.7*    CBC Recent Labs  Lab 09/06/17 0645 09/07/17 0025 09/07/17 1906 09/08/17 0021 09/08/17 0025  WBC 5.6 6.1 5.7  --   --   HGB 9.8* 9.8* 9.4* 9.2* 9.9*  HCT 31.4* 30.7* 30.0* 27.0* 29.0*  PLT 272 268 255  --   --     Coag's No results for input(s): APTT, INR in the last 168 hours.  Sepsis Markers Recent Labs  Lab 09/07/17 1913  LATICACIDVEN 1.00    ABG Recent Labs  Lab 09/08/17 0135  PHART 7.277*  PCO2ART 54.1*  PO2ART 83.1    Liver Enzymes Recent Labs  Lab 09/07/17 1906  AST 21  ALT 22  ALKPHOS 56  BILITOT 0.9  ALBUMIN 2.9*    Cardiac Enzymes Recent Labs  Lab 09/07/17 1925  TROPONINI <0.03    Glucose No results for  input(s): GLUCAP in the last 168 hours.  Imaging Dg Chest Port 1 View  Result Date: 09/08/2017 CLINICAL DATA:  Initial evaluation for endotracheal tube placement. Respiratory failure. EXAM: PORTABLE CHEST 1 VIEW COMPARISON:  Prior radiograph from 09/07/2017. FINDINGS: Endotracheal tube in place with tip positioned approximately 11 mm above the carina. Cardiomegaly, stable. Mediastinal silhouette within normal limits. Lungs are hypoinflated. Diffuse vascular congestion with bronchovascular crowding and bibasilar atelectatic changes. No definite new focal infiltrates. No pneumothorax. No acute osseous abnormality. IMPRESSION: 1. Tip of the endotracheal tube positioned 11 mm above the carina. 2. Low lung volumes with secondary bibasilar atelectasis/bronchovascular crowding. 3. Stable cardiomegaly with  diffuse pulmonary vascular congestion without overt edema. Electronically Signed   By: Jeannine Boga M.D.   On: 09/08/2017 02:31   Dg Chest Port 1 View  Result Date: 09/07/2017 CLINICAL DATA:  67 year old female with fever for 2 days.  Hypoxia. EXAM: PORTABLE CHEST 1 VIEW COMPARISON:  Portable chest 08/25/2017 and earlier. FINDINGS: Portable AP upright view at 1905 hours. Lower lung volumes and persistent lordotic positioning. Stable mild cardiomegaly. Other mediastinal contours are within normal limits. Visualized tracheal air column is within normal limits. Allowing for portable technique the lungs are clear. Paucity of bowel gas in the upper abdomen. No acute osseous abnormality identified. IMPRESSION: Low lung volumes.  No acute cardiopulmonary abnormality. Electronically Signed   By: Genevie Ann M.D.   On: 09/07/2017 19:38   Dg C-arm 1-60 Min-no Report  Result Date: 09/07/2017 Fluoroscopy was utilized by the requesting physician.  No radiographic interpretation.     STUDIES:  4/20 CT abd > Changes consistent with left UPJ obstruction. Associated inflammatory changes and mottled enhancement in the  left kidney are noted suggestive of pyelonephritis. Mild cirrhotic change. Uterine fibroids.  CULTURES: Blood 5/7 > Urine 5/7 >  ANTIBIOTICS: In ED vanco, meropenem, levofloxacin, aztreonam 5/7  SIGNIFICANT EVENTS: 4/25 discharged on meropenem for ESBL E. Coli UTI 5/7 admit for fever presumed pyelonephritis 5/8 to OR for ureteral stent. Pus aspirated from ureter.   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 was intubated and sedated for Cystoscopy with left retrograde pyelogram, left ureteral catheterization with aspiration of 350 cc of pus for culture, and left ureteral stent placement Admitted to ICU  ASSESSMENT / PLAN:  PULMONARY A: Hypercarbic respiratory failure, acute Inability to protect airway in post-operative setting COPD without acute exacerbation  P:   Full vent support overnight Assess for extubation in the AM CXR- assess position of ETT ( currently 22 at lip) ABG VAP bundle  CARDIOVASCULAR A:  Hx HLD  P:  Telemetry monitoring in ICU  RENAL A:   AKI  P:   Hydrate- receiving 3L IVF bolus Follow BMP  GASTROINTESTINAL A:   No acute issues  P:   NPO No OGT at this time- hope to extubate later in AM Pepcid  HEMATOLOGIC A:   Anemia (Hgb 9.2, was 13 last month)  P:  Follow CBC Lovenox for VTE ppx  INFECTIOUS A:   SIRS/Sepsis secondary to pyelonephritis.   P:   Continue ABX, given recent Hx ESBL E. Coli UTI  recommend Meropenem Follow PCT and WBC/fevers  ENDOCRINE A:   No acute issues    P:   Follow glucose on chemistry  NEUROLOGIC A:   Bipolar disorder Encephalopathy due to medical sedation  P:   RASS goal -1 to -2 On Propofol ggt on my evaluation Discontinue and switc to Fentanyl infusion PRN versed  3 FAMILY  - Updates  - Inter-disciplinary family meet or Palliative Care meeting due by:  5/15  Signed Dr  Seward Carol Pulmonary Critical Care Locums

## 2017-09-08 NOTE — Progress Notes (Signed)
RT note: Pt. placed to full support@8cc /kg IBW along with ETAD placed on E.T. tube due to pt. plan to be transported to ICU/SD overnite with planned extubation in a.m., PACU staff aware, RT to monitor.

## 2017-09-08 NOTE — Progress Notes (Signed)
Urology Inpatient Progress Report  Nausea and vomiting in adult patient [R11.2] Fever in adult [R50.9] Urinary tract infection without hematuria, site unspecified [N39.0]  Procedure(s): CYSTOSCOPY WITH RETROGRADE PYELOGRAM/URETERAL STENT PLACEMENT  1 Day Post-Op   Intv/Subj: Extubated this am. Patient is without complaint. Appetite much improved, feeling better  Principal Problem:   Urinary tract infection due to extended-spectrum beta lactamase (ESBL) producing Escherichia coli Active Problems:   Pyelonephritis   COPD (chronic obstructive pulmonary disease) ([facility])   Renal abscess   Respiratory failure with hypercapnia ([facility])  Current Facility-Administered Medications  Medication Dose Route Frequency Provider Last Rate Last Dose  . 0.9 %  sodium chloride infusion  1,000 mL Intravenous Continuous Collene Gobble, MD 50 mL/hr at 09/08/17 1700    . acetaminophen (TYLENOL) tablet 650 mg  650 mg Oral Q6H PRN Gala Romney L, MD      . aspirin chewable tablet 81 mg  81 mg Oral q morning - 10a Jonelle Sidle, Mohammad L, MD   81 mg at 09/08/17 1025  . atorvastatin (LIPITOR) tablet 40 mg  40 mg Oral QHS Garba, Mohammad L, MD      . divalproex (DEPAKOTE) DR tablet 250 mg  250 mg Oral TID Elwyn Reach, MD   250 mg at 09/08/17 1621  . docusate sodium (COLACE) capsule 100 mg  100 mg Oral Daily PRN Gala Romney L, MD      . enoxaparin (LOVENOX) injection 40 mg  40 mg Subcutaneous Q24H Gala Romney L, MD   40 mg at 09/08/17 1026  . escitalopram (LEXAPRO) tablet 10 mg  10 mg Oral q morning - 10a Jonelle Sidle, Mohammad L, MD   10 mg at 09/08/17 1027  . famotidine (PEPCID) IVPB 20 mg premix  20 mg Intravenous Q12H Anders Simmonds, MD   Stopped at 09/08/17 [phone]  . fentaNYL (SUBLIMAZE) 2,500 mcg in sodium chloride 0.9 % 250 mL (10 mcg/mL) infusion  25-400 mcg/hr Intravenous Continuous Elwyn Reach, MD   Stopped at 09/08/17 [phone]  . fentaNYL (SUBLIMAZE) bolus via infusion 25 mcg  25 mcg Intravenous  Q1H PRN Corey Harold, NP      . ipratropium-albuterol (DUONEB) 0.5-2.5 (3) MG/3ML nebulizer solution 3 mL  3 mL Nebulization Q6H PRN Gala Romney L, MD      . levothyroxine (SYNTHROID, LEVOTHROID) tablet 125 mcg  125 mcg Oral QAC breakfast Elwyn Reach, MD   125 mcg at 09/08/17 1028  . loratadine (CLARITIN) tablet 10 mg  10 mg Oral q morning - 10a Elwyn Reach, MD   10 mg at 09/08/17 1025  . meropenem (MERREM) 1 g in sodium chloride 0.9 % 100 mL IVPB  1 g Intravenous Q8H Elwyn Reach, MD   Stopped at 09/08/17 1527  . multivitamin with minerals tablet 1 tablet  1 tablet Oral q morning - 10a Elwyn Reach, MD   1 tablet at 09/08/17 1025  . ondansetron (ZOFRAN) injection 4 mg  4 mg Intravenous Q1H PRN Francine Graven, DO   4 mg at 09/07/17 1944  . primidone (MYSOLINE) tablet 50 mg  50 mg Oral QHS Garba, Mohammad L, MD      . sodium chloride 0.9 % bolus 500 mL  500 mL Intravenous Once Francine Graven, DO         Objective: Vital: Vitals:   09/08/17 1500 09/08/17 1600 09/08/17 1613 09/08/17 1700  BP: 115/62 (!) 124/58  127/62  Pulse: 64 62  (!) 59  Resp: Marland Kitchen)  23 (!) 22  20  Temp:   98.5 F (36.9 C)   TempSrc:   Oral   SpO2: 94% 95%  96%  Weight:      Height:       I/Os: I/O last 3 completed shifts: In: 1028.9 [I.V.:1028.9] Out: 1500 [Urine:1500]  Physical Exam:  General: Patient is in no apparent distress Lungs: Normal respiratory effort, chest expands symmetrically. GI: The abdomen is soft and nontender without mass. JP drain with serosanguinous drainage Foley: draining thick bright pink/red Ext: lower extremities symmetric  Lab Results: Recent Labs    09/07/17 0025 09/07/17 1906 09/08/17 0021 09/08/17 0025 09/08/17 0344  WBC 6.1 5.7  --   --  4.2  HGB 9.8* 9.4* 9.2* 9.9* 8.2*  HCT 30.7* 30.0* 27.0* 29.0* 25.4*   Recent Labs    09/07/17 0025 09/07/17 1906 09/08/17 0021 09/08/17 0025 09/08/17 0344  NA 137 138 139 139 138  K 5.0 4.5 5.0  5.1 5.2*  CL 99* 100*  --   --  105  CO2 28 27  --   --  23  GLUCOSE 147* 154* 169*  --  235*  BUN 18 23*  --   --  20  CREATININE 0.79 1.03*  --   --  0.77  0.75  CALCIUM 8.5* 8.7*  --   --  7.9*   No results for input(s): LABPT, INR in the last 72 hours. No results for input(s): LABURIN in the last 72 hours. Results for orders placed or performed during the hospital encounter of 09/07/17  Blood Culture (routine x 2)     Status: None (Preliminary result)   Collection Time: 09/07/17  7:07 PM  Result Value Ref Range Status   Specimen Description BLOOD RIGHT FOREARM  Final   Special Requests   Final    BOTTLES DRAWN AEROBIC AND ANAEROBIC Blood Culture adequate volume   Culture   Final    NO GROWTH < 12 HOURS Performed at [facility], [address], [place], [place] [address]    Report Status PENDING  Incomplete  Blood Culture (routine x 2)     Status: None (Preliminary result)   Collection Time: 09/07/17  7:11 PM  Result Value Ref Range Status   Specimen Description BLOOD LEFT FOREARM  Final   Special Requests   Final    BOTTLES DRAWN AEROBIC AND ANAEROBIC Blood Culture adequate volume   Culture   Final    NO GROWTH < 12 HOURS Performed at [facility], [address] [place], [place] [address]    Report Status PENDING  Incomplete  MRSA PCR Screening     Status: None   Collection Time: 09/08/17  3:30 AM  Result Value Ref Range Status   MRSA by PCR NEGATIVE NEGATIVE Final    Comment:        The GeneXpert MRSA Assay (FDA approved for NASAL specimens only), is one component of a comprehensive MRSA colonization surveillance program. It is not intended to diagnose MRSA infection nor to guide or monitor treatment for MRSA infections. Performed at [facility], [place] [address] [place], [place] [address]     Studies/Results: Dg Chest Port 1 View  Result Date: 09/08/2017 CLINICAL DATA:  67 year old female with endotracheal tube. Subsequent encounter.  EXAM: PORTABLE CHEST 1 VIEW COMPARISON:  09/08/2017 1:02 a.m. FINDINGS: Endotracheal tube tip 1.3 cm above the carina. Slight decrease in degree of pulmonary vascular congestion. Cardiomegaly and prominent mediastinum unchanged. Slightly elevated right  hemidiaphragm. No acute osseous abnormality. IMPRESSION: Slight decrease in degree of pulmonary vascular congestion. Cardiomegaly and prominent mediastinum unchanged. Endotracheal tube tip 1.3 cm above the carina. Electronically Signed   By: Genia Del M.D.   On: 09/08/2017 06:56   Dg Chest Port 1 View  Result Date: 09/08/2017 CLINICAL DATA:  Initial evaluation for endotracheal tube placement. Respiratory failure. EXAM: PORTABLE CHEST 1 VIEW COMPARISON:  Prior radiograph from 09/07/2017. FINDINGS: Endotracheal tube in place with tip positioned approximately 11 mm above the carina. Cardiomegaly, stable. Mediastinal silhouette within normal limits. Lungs are hypoinflated. Diffuse vascular congestion with bronchovascular crowding and bibasilar atelectatic changes. No definite new focal infiltrates. No pneumothorax. No acute osseous abnormality. IMPRESSION: 1. Tip of the endotracheal tube positioned 11 mm above the carina. 2. Low lung volumes with secondary bibasilar atelectasis/bronchovascular crowding. 3. Stable cardiomegaly with diffuse pulmonary vascular congestion without overt edema. Electronically Signed   By: Jeannine Boga M.D.   On: 09/08/2017 02:31   Dg Chest Port 1 View  Result Date: 09/07/2017 CLINICAL DATA:  67 year old female with fever for 2 days.  Hypoxia. EXAM: PORTABLE CHEST 1 VIEW COMPARISON:  Portable chest 08/25/2017 and earlier. FINDINGS: Portable AP upright view at 1905 hours. Lower lung volumes and persistent lordotic positioning. Stable mild cardiomegaly. Other mediastinal contours are within normal limits. Visualized tracheal air column is within normal limits. Allowing for portable technique the lungs are clear. Paucity of bowel  gas in the upper abdomen. No acute osseous abnormality identified. IMPRESSION: Low lung volumes.  No acute cardiopulmonary abnormality. Electronically Signed   By: Genevie Ann M.D.   On: 09/07/2017 19:38   Dg C-arm 1-60 Min-no Report  Result Date: 09/07/2017 Fluoroscopy was utilized by the requesting physician.  No radiographic interpretation.    Assessment: Left UPJ obstruction Chronic pyelonephritis  Plan: Agree with continuing antibiotics.  She will need to follow up with me in about 2 weeks.  I'll plan to obtain a renal scan with Lasix to evaluate renal function once she has recovered some.  This will evaluate for degree of function to decide whether she would benefit from pyeloplasty versus nephrectomy.if she does well overnight, can DC Foley.  Procedure(s): CYSTOSCOPY WITH RETROGRADE PYELOGRAM/URETERAL STENT PLACEMENT, 1 Day Post-Op  doing well.    Link Snuffer, MD Urology 09/08/2017, 5:24 PM

## 2017-09-08 NOTE — Progress Notes (Signed)
Pharmacy Antibiotic Note  Laurie Moreno is a 67 y.o. female admitted on 09/07/2017 with ESBL infection.  Pharmacy has been consulted for Meropenem dosing.  Plan: Meropenem 1gm iv q8hr  Height: 5\' 3"  (160 cm) Weight: 193 lb (87.5 kg) IBW/kg (Calculated) : 52.4  Temp (24hrs), Avg:99.8 F (37.7 C), Min:98.1 F (36.7 C), Max:103.4 F (39.7 C)  Recent Labs  Lab 09/06/17 0645 09/06/17 0730 09/07/17 0025 09/07/17 1906 09/07/17 1913  WBC 5.6  --  6.1 5.7  --   CREATININE  --  0.72 0.79 1.03*  --   LATICACIDVEN  --   --   --   --  1.00    Estimated Creatinine Clearance: 56.3 mL/min (A) (by C-G formula based on SCr of 1.03 mg/dL (H)).    Allergies  Allergen Reactions  . Penicillins Hives, Rash and Other (See Comments)    Not effective per patient. Pt does not know. She states it was when she was a child. Unknown    Antimicrobials this admission: Meropenem 09/07/2017 >>  Dose adjustments this admission: -  Microbiology results: -  Thank you for allowing pharmacy to be a part of this patient's care.  Nani Skillern Crowford 09/08/2017 4:28 AM

## 2017-09-08 NOTE — Progress Notes (Signed)
Pt. arrived from OR to PACU intubated with 7.5 E.T. @ 22 cm with pink tape/intact, placed to CPAP/PS for trial, ABG/CXR ordered per MD, possible extubation vs. transporting to ICU/SD once bed becomes available. RT to monitor.

## 2017-09-08 NOTE — Anesthesia Postprocedure Evaluation (Signed)
Anesthesia Post Note  Patient: [name]  Procedure(s) Performed: CYSTOSCOPY WITH RETROGRADE PYELOGRAM/URETERAL STENT PLACEMENT (Left Ureter)     Patient location during evaluation: ICU Anesthesia Type: General Level of consciousness: sedated Pain management: pain level controlled Vital Signs Assessment: post-procedure vital signs reviewed and stable Respiratory status: patient on ventilator - see flowsheet for VS Cardiovascular status: stable Postop Assessment: no apparent nausea or vomiting Anesthetic complications: no Comments: During emergence, patient had climbing ETCO2 due to poor respiratory effort. Called into room, on arrival, ETCO2 in 80-90's. Patient had received narcan and albuterol prior to my arrival. Another albuterol treatment given for some wheezing, coarse breath sounds. Patient with TV 60-80's spontaneous, pulling adequate volumes on PSV with PS of 10-12. Twitches checked (had only received succinylcholine at induction), no signs of weakness. Patient very anxious, able to open eyes and squeeze hands on command, but unable to ventilate appropriately. Sedated patient with propofol, manually recruited patient. SpO2 in low 90's on 100% O2 during hand ventilation. VBG drawn, patient with PCO2 in 80's, pH 7.1. Electrolytes WNL, glucose 160's. Did not feel patient would tolerate extubation due to persistent hypercarbia and poor respiratory effort. Patient brought to PACU intubated until ICU bed made available. ABG and CXR ordered.    Last Vitals:  Vitals:   09/07/17 2147 09/07/17 2240  BP:  (!) 98/53  Pulse:  76  Resp:  20  Temp: 37.6 C 37.3 C  SpO2:  98%    Last Pain:  Vitals:   09/07/17 2240  TempSrc: Oral  PainSc: 0-No pain                 Audry Pili

## 2017-09-08 NOTE — Progress Notes (Signed)
PULMONARY / CRITICAL CARE MEDICINE   Name: Laurie Moreno MRN: [id number] DOB: 04-14-1951    ADMISSION DATE:  09/07/2017 CONSULTATION DATE:  09/08/2017  REFERRING MD:  Dr. Jonelle Sidle [facility]  CHIEF COMPLAINT:  Post op vent, pyelonephritis  HISTORY OF PRESENT ILLNESS:  Patient is encephalopathic and/or intubated. Therefore history has been obtained from chart review. 67 year old female with PMH as below, which is significant for Bipolar disorder, nephrolithiasis, COPD, and hypothyroidism. She was recently admitted for ESBL E. coli pyelonephritis and treated with 7 days of meropenem and discharged to SNF. Hydronephrosis was seen on CT, but was chronic and urology recommended outpatient follow up. She was scheduled to have L ureteral stent 5/8. She then presented again to ED 5/7 with complaints of fever/chills. She was transferred to [facility] for urology evaluation. She had fever to 103F. She was seen by urology who took her to the OR for Cystoscopy with left retrograde pyelogram, left ureteral catheterization with aspiration of 350 cc of pus for culture, and left ureteral stent placement. Post operatively she remained on vent and was transferred to ICU for further care.   SUBJECTIVE:  Tolerating pressure support 5 this morning Maintenance IV fluids running but has not required pressors  VITAL SIGNS: BP (!) 107/59   Pulse 60   Temp 98.2 F (36.8 C) (Oral)   Resp 20   Ht 5\' 3"  (1.6 m)   Wt 87.5 kg (193 lb)   SpO2 95%   BMI 34.19 kg/m   HEMODYNAMICS:    VENTILATOR SETTINGS: Vent Mode: PSV FiO2 (%):  [30 %-50 %] 30 % Set Rate:  [20 bmp] 20 bmp Vt Set:  [420 mL] 420 mL PEEP:  [5 cmH20] 5 cmH20 Pressure Support:  [5 cmH20-10 cmH20] 5 cmH20 Plateau Pressure:  [16 cmH20] 16 cmH20  INTAKE / OUTPUT: I/O last 3 completed shifts: In: 1028.9 [I.V.:1028.9] Out: 1500 [Urine:1500]  PHYSICAL EXAMINATION: General: Obese woman in no distress, awake Neuro: Awake, interacts, nods to questions, follows  commands, strong cough on request HEENT: Endotracheal tube in place, pupils equal, no oral lesions noted Cardiovascular: Regular, normal rate, no murmurs Lungs: Decreased at bilateral bases, no wheezing, no crackles Abdomen: Obese, soft, nontender, nondistended, positive bowel sounds Musculoskeletal:   No deformity  Skin: No rash  LABS:  BMET Recent Labs  Lab 09/07/17 0025 09/07/17 1906 09/08/17 0021 09/08/17 0025 09/08/17 0344  NA 137 138 139 139 138  K 5.0 4.5 5.0 5.1 5.2*  CL 99* 100*  --   --  105  CO2 28 27  --   --  23  BUN 18 23*  --   --  20  CREATININE 0.79 1.03*  --   --  0.77  0.75  GLUCOSE 147* 154* 169*  --  235*    Electrolytes Recent Labs  Lab 09/07/17 0025 09/07/17 1906 09/08/17 0344  CALCIUM 8.5* 8.7* 7.9*    CBC Recent Labs  Lab 09/07/17 0025 09/07/17 1906 09/08/17 0021 09/08/17 0025 09/08/17 0344  WBC 6.1 5.7  --   --  4.2  HGB 9.8* 9.4* 9.2* 9.9* 8.2*  HCT 30.7* 30.0* 27.0* 29.0* 25.4*  PLT 268 255  --   --  215    Coag's No results for input(s): APTT, INR in the last 168 hours.  Sepsis Markers Recent Labs  Lab 09/07/17 1913  LATICACIDVEN 1.00    ABG Recent Labs  Lab 09/08/17 0135  PHART 7.277*  PCO2ART 54.1*  PO2ART 83.1  Liver Enzymes Recent Labs  Lab 09/07/17 1906 09/08/17 0344  AST 21 14*  ALT 22 18  ALKPHOS 56 44  BILITOT 0.9 0.8  ALBUMIN 2.9* 2.3*    Cardiac Enzymes Recent Labs  Lab 09/07/17 1925  TROPONINI <0.03    Glucose No results for input(s): GLUCAP in the last 168 hours.  Imaging Dg Chest Port 1 View  Result Date: 09/08/2017 CLINICAL DATA:  67 year old female with endotracheal tube. Subsequent encounter. EXAM: PORTABLE CHEST 1 VIEW COMPARISON:  09/08/2017 1:02 a.m. FINDINGS: Endotracheal tube tip 1.3 cm above the carina. Slight decrease in degree of pulmonary vascular congestion. Cardiomegaly and prominent mediastinum unchanged. Slightly elevated right hemidiaphragm. No acute osseous  abnormality. IMPRESSION: Slight decrease in degree of pulmonary vascular congestion. Cardiomegaly and prominent mediastinum unchanged. Endotracheal tube tip 1.3 cm above the carina. Electronically Signed   By: Genia Del M.D.   On: 09/08/2017 06:56   Dg Chest Port 1 View  Result Date: 09/08/2017 CLINICAL DATA:  Initial evaluation for endotracheal tube placement. Respiratory failure. EXAM: PORTABLE CHEST 1 VIEW COMPARISON:  Prior radiograph from 09/07/2017. FINDINGS: Endotracheal tube in place with tip positioned approximately 11 mm above the carina. Cardiomegaly, stable. Mediastinal silhouette within normal limits. Lungs are hypoinflated. Diffuse vascular congestion with bronchovascular crowding and bibasilar atelectatic changes. No definite new focal infiltrates. No pneumothorax. No acute osseous abnormality. IMPRESSION: 1. Tip of the endotracheal tube positioned 11 mm above the carina. 2. Low lung volumes with secondary bibasilar atelectasis/bronchovascular crowding. 3. Stable cardiomegaly with diffuse pulmonary vascular congestion without overt edema. Electronically Signed   By: Jeannine Boga M.D.   On: 09/08/2017 02:31   Dg Chest Port 1 View  Result Date: 09/07/2017 CLINICAL DATA:  67 year old female with fever for 2 days.  Hypoxia. EXAM: PORTABLE CHEST 1 VIEW COMPARISON:  Portable chest 08/25/2017 and earlier. FINDINGS: Portable AP upright view at 1905 hours. Lower lung volumes and persistent lordotic positioning. Stable mild cardiomegaly. Other mediastinal contours are within normal limits. Visualized tracheal air column is within normal limits. Allowing for portable technique the lungs are clear. Paucity of bowel gas in the upper abdomen. No acute osseous abnormality identified. IMPRESSION: Low lung volumes.  No acute cardiopulmonary abnormality. Electronically Signed   By: Genevie Ann M.D.   On: 09/07/2017 19:38   Dg C-arm 1-60 Min-no Report  Result Date: 09/07/2017 Fluoroscopy was utilized by  the requesting physician.  No radiographic interpretation.     STUDIES:  4/20 CT abd > Changes consistent with left UPJ obstruction. Associated inflammatory changes and mottled enhancement in the left kidney are noted suggestive of pyelonephritis. Mild cirrhotic change. Uterine fibroids.  CULTURES: Blood 5/7 > Urine 5/7 >  ANTIBIOTICS: In ED vanco, meropenem, levofloxacin, aztreonam 5/7  SIGNIFICANT EVENTS: 4/25 discharged on meropenem for ESBL E. Coli UTI 5/7 admit for fever presumed pyelonephritis 5/8 to OR for ureteral stent. Pus aspirated from ureter.   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 was intubated and sedated for Cystoscopy with left retrograde pyelogram, left ureteral catheterization with aspiration of 350 cc of pus for culture, and left ureteral stent placement Admitted to ICU  ASSESSMENT / PLAN:  PULMONARY A: Hypercarbic respiratory failure, acute Inability to protect airway in post-operative setting COPD without acute exacerbation  P:   Tolerating pressure support ventilation currently, meets criteria for extubation will proceed this morning Push pulmonary hygiene  CARDIOVASCULAR A:  Hx HLD Hypertension  P:  Telemetry monitoring in ICU Continue home Lipitor Home carvedilol on hold, restart when tolerating p.o., blood pressure will tolerate.  RENAL A:   AKI, resolved  P:   Follow urine output and BMP Decrease maintenance IV fluids 5/8 to 50 cc/h  GASTROINTESTINAL A:   No acute issues  P:   Restart diet 5/8 post extubation, heart healthy Pepcid as ordered, discontinue once taking good oral diet  HEMATOLOGIC A:   Anemia (Hgb 9.2, was 13 last month)  P:  Follow CBC Continue enoxaparin prophylaxis  INFECTIOUS A:   SIRS/Sepsis secondary to left pyelonephritis.   P:   Continue meropenem given her history of an ESBL E.  coli in the past Cultures pending, tailor antibiotics based on results  ENDOCRINE A:   Hypothyroidism    P:   Continue Synthroid   NEUROLOGIC A:   Bipolar disorder Encephalopathy due to medical sedation  P:   RASS goal 0 Perfectly awake this morning, discontinue sedating medications Home clonazepam on hold Continue home Depakote, Lexapro; follow valproate level Restart home gabapentin at some point over the next several days   FAMILY  - Updates: No family at bedside currently 5/8  - Inter-disciplinary family meet or Palliative Care meeting due by:  5/15  Independent CC time 24 minutes  Baltazar Apo, MD, PhD 09/08/2017, 9:37 AM [facility] [phone] or if no answer [phone]

## 2017-09-08 NOTE — Transfer of Care (Signed)
Immediate Anesthesia Transfer of Care Note  Patient: Laurie Moreno  Procedure(s) Performed: CYSTOSCOPY WITH RETROGRADE PYELOGRAM/URETERAL STENT PLACEMENT (Left Ureter)  Patient Location: PACU  Anesthesia Type:General  Level of Consciousness: sedated and drowsy  Airway & Oxygen Therapy: Patient placed on Ventilator (see vital sign flow sheet for setting)  Post-op Assessment: Report given to RN and Post -op Vital signs reviewed and stable  Post vital signs: Reviewed and stable  Last Vitals:  Vitals Value Taken Time  BP 97/58 09/08/2017  1:00 AM  Temp    Pulse 88 09/08/2017  1:00 AM  Resp 16 09/08/2017  1:00 AM  SpO2 99 % 09/08/2017  1:00 AM  Vitals shown include unvalidated device data.  Last Pain:  Vitals:   09/07/17 2240  TempSrc: Oral  PainSc: 0-No pain         Complications: respiratory complications

## 2017-09-08 NOTE — Progress Notes (Signed)
[place] Progress Note Patient Name: Laurie Moreno DOB: 12/20/1950 MRN: [id number]   Date of Service  09/08/2017  HPI/Events of Note  Notified of need for stress ulcer prophylaxis.   eICU Interventions  Will order Pepcid IV.      Intervention Category Intermediate Interventions: Best-practice therapies (e.g. DVT, beta blocker, etc.)  Sommer,Steven Eugene 09/08/2017, 6:51 AM

## 2017-09-08 NOTE — Progress Notes (Signed)
[place] Progress Note Patient Name: Laurie Moreno DOB: Apr 20, 1951 MRN: [id number]   Date of Service  09/08/2017  HPI/Events of Note  Post-op pain  eICU Interventions  Oxycodone/acetamiophen 5-325 1 tab po Q 4 hours prn pain        [name] 09/08/2017, 10:04 PM

## 2017-09-08 NOTE — Progress Notes (Signed)
PHARMACY - PHYSICIAN COMMUNICATION CRITICAL VALUE ALERT - BLOOD CULTURE IDENTIFICATION (BCID)  Laurie Moreno is an 67 y.o. female who presented to [facility] on 09/07/2017 with a chief complaint of pyelonephritis.  Assessment:  Likely contaminant (include suspected source if known)  Name of physician (or Provider) Contacted: Dr. Lamonte Sakai  Current antibiotics: meropenem and vancomycin  Changes to prescribed antibiotics recommended:  Recommendations accepted by provider - stop vancomycin  Results for orders placed or performed during the hospital encounter of 09/07/17  Blood Culture ID Panel (Reflexed) (Collected: 09/07/2017 12:30 AM)  Result Value Ref Range   Enterococcus species NOT DETECTED NOT DETECTED   Listeria monocytogenes NOT DETECTED NOT DETECTED   Staphylococcus species DETECTED (A) NOT DETECTED   Staphylococcus aureus NOT DETECTED NOT DETECTED   Methicillin resistance DETECTED (A) NOT DETECTED   Streptococcus species NOT DETECTED NOT DETECTED   Streptococcus agalactiae NOT DETECTED NOT DETECTED   Streptococcus pneumoniae NOT DETECTED NOT DETECTED   Streptococcus pyogenes NOT DETECTED NOT DETECTED   Acinetobacter baumannii NOT DETECTED NOT DETECTED   Enterobacteriaceae species NOT DETECTED NOT DETECTED   Enterobacter cloacae complex NOT DETECTED NOT DETECTED   Escherichia coli NOT DETECTED NOT DETECTED   Klebsiella oxytoca NOT DETECTED NOT DETECTED   Klebsiella pneumoniae NOT DETECTED NOT DETECTED   Proteus species NOT DETECTED NOT DETECTED   Serratia marcescens NOT DETECTED NOT DETECTED   Haemophilus influenzae NOT DETECTED NOT DETECTED   Neisseria meningitidis NOT DETECTED NOT DETECTED   Pseudomonas aeruginosa NOT DETECTED NOT DETECTED   Candida albicans NOT DETECTED NOT DETECTED   Candida glabrata NOT DETECTED NOT DETECTED   Candida krusei NOT DETECTED NOT DETECTED   Candida parapsilosis NOT DETECTED NOT DETECTED   Candida tropicalis NOT DETECTED NOT DETECTED    Peggyann Juba, PharmD, BCPS Pager: [phone] 09/08/2017  2:49 PM

## 2017-09-08 NOTE — Progress Notes (Signed)
PCCM Interval Note  I was just notified that nursing received preliminary culture results, gram-positive cocci.  Based on this I will re-dose her vancomycin until complete speciation and sensitivities are obtained.  Baltazar Apo, MD, PhD 09/08/2017, 9:55 AM [facility] [phone] or if no answer [phone]

## 2017-09-08 NOTE — Progress Notes (Signed)
Pharmacy Antibiotic Note  Laurie Moreno is a 67 y.o. female admitted on 09/07/2017 with recent hx multi-drug resistant E.coli infection.  Pharmacy has been consulted for Meropenem dosing.  Notified that 1 of 2 blood cultures from 5/7 (+) for gram positive cocci, Pharmacy consulted to continue vancomycin (received 1g dose last night ~20:00)  Plan: Vancomycin 1g IV q24h for estimated AUC 446 Check vancomycin levels at steady state, goal AUC 400-500 Continue Meropenem 1gm iv q8hr  Height: 5\' 3"  (160 cm) Weight: 193 lb (87.5 kg) IBW/kg (Calculated) : 52.4  Temp (24hrs), Avg:99.6 F (37.6 C), Min:98.1 F (36.7 C), Max:103.4 F (39.7 C)  Recent Labs  Lab 09/06/17 0645 09/06/17 0730 09/07/17 0025 09/07/17 1906 09/07/17 1913 09/08/17 0344  WBC 5.6  --  6.1 5.7  --  4.2  CREATININE  --  0.72 0.79 1.03*  --  0.77  0.75  LATICACIDVEN  --   --   --   --  1.00  --     Estimated Creatinine Clearance: 72.5 mL/min (by C-G formula based on SCr of 0.75 mg/dL).    Allergies  Allergen Reactions  . Penicillins Hives, Rash and Other (See Comments)    Not effective per patient. Pt does not know. She states it was when she was a child. Unknown    Antimicrobials this admission:  5/7 Aztreo/LVQ/Vanc x 1 5/8 Meropenem >> 5/8 Vancomycin resumed >>  Dose adjustments this admission:    Microbiology results:  5/7 BCx [phone]): 1 of 2 GPC 5/7 BCx (1907): ngtd 5/7 UCx: sent 5/8 MRSA PCR: negative  Previous admit: 4/20 UCx: >100k E.coli (S gent, imip, nitro, zosyn) 4/20 BCx: NGF  Thank you for allowing pharmacy to be a part of this patient's care.  Peggyann Juba, PharmD, BCPS Pager: [phone] 09/08/2017 10:08 AM

## 2017-09-09 DIAGNOSIS — J449 Chronic obstructive pulmonary disease, unspecified: Secondary | ICD-10-CM

## 2017-09-09 DIAGNOSIS — N12 Tubulo-interstitial nephritis, not specified as acute or chronic: Secondary | ICD-10-CM

## 2017-09-09 DIAGNOSIS — N151 Renal and perinephric abscess: Secondary | ICD-10-CM

## 2017-09-09 LAB — COMPREHENSIVE METABOLIC PANEL
ALK PHOS: 43 U/L (ref 38–126)
ALT: 19 U/L (ref 14–54)
ANION GAP: 8 (ref 5–15)
AST: 19 U/L (ref 15–41)
Albumin: 2.3 g/dL — ABNORMAL LOW (ref 3.5–5.0)
BILIRUBIN TOTAL: 0.4 mg/dL (ref 0.3–1.2)
BUN: 19 mg/dL (ref 6–20)
CALCIUM: 8.7 mg/dL — AB (ref 8.9–10.3)
CO2: 26 mmol/L (ref 22–32)
CREATININE: 0.75 mg/dL (ref 0.44–1.00)
Chloride: 109 mmol/L (ref 101–111)
Glucose, Bld: 179 mg/dL — ABNORMAL HIGH (ref 65–99)
Potassium: 4.6 mmol/L (ref 3.5–5.1)
Sodium: 143 mmol/L (ref 135–145)
TOTAL PROTEIN: 6 g/dL — AB (ref 6.5–8.1)

## 2017-09-09 LAB — CBC WITH DIFFERENTIAL/PLATELET
Basophils Absolute: 0 10*3/uL (ref 0.0–0.1)
Basophils Relative: 1 %
EOS ABS: 0 10*3/uL (ref 0.0–0.7)
Eosinophils Relative: 0 %
HEMATOCRIT: 26.7 % — AB (ref 36.0–46.0)
HEMOGLOBIN: 8.2 g/dL — AB (ref 12.0–15.0)
LYMPHS ABS: 1.6 10*3/uL (ref 0.7–4.0)
LYMPHS PCT: 40 %
MCH: 29.3 pg (ref 26.0–34.0)
MCHC: 30.7 g/dL (ref 30.0–36.0)
MCV: 95.4 fL (ref 78.0–100.0)
MONOS PCT: 8 %
Monocytes Absolute: 0.3 10*3/uL (ref 0.1–1.0)
NEUTROS ABS: 2.1 10*3/uL (ref 1.7–7.7)
NEUTROS PCT: 51 %
Platelets: 197 10*3/uL (ref 150–400)
RBC: 2.8 MIL/uL — ABNORMAL LOW (ref 3.87–5.11)
RDW: 13 % (ref 11.5–15.5)
WBC: 4 10*3/uL (ref 4.0–10.5)

## 2017-09-09 LAB — URINE CULTURE

## 2017-09-09 LAB — MAGNESIUM: Magnesium: 1.9 mg/dL (ref 1.7–2.4)

## 2017-09-09 LAB — PHOSPHORUS: Phosphorus: 3 mg/dL (ref 2.5–4.6)

## 2017-09-09 LAB — VALPROIC ACID LEVEL: Valproic Acid Lvl: <10 ug/mL — ABNORMAL LOW (ref 50.0–100.0)

## 2017-09-09 MED ORDER — TRAZODONE HCL 50 MG PO TABS
50.0000 mg | ORAL_TABLET | Freq: Once | ORAL | Status: AC
Start: 2017-09-09 — End: 2017-09-09
  Administered 2017-09-09: 50 mg via ORAL
  Filled 2017-09-09: qty 1

## 2017-09-09 NOTE — Evaluation (Signed)
Physical Therapy Evaluation Patient Details Name: Laurie Moreno MRN: [id number] DOB: Apr 01, 1951 Today's Date: 09/09/2017   History of Present Illness  Pt admitted with UTI and resp failure 2* COPD exacerbation.  Pt required intubation and extubated 09/08/17.  Pt with hx of bipolar and essential tremor  Clinical Impression  Pt admitted as above and presenting with functional mobility limitations 2* generalized weakness and balance deficits.  Pt very motivated and would benefit from follow up HHPT at ALF to further address deficits.    Follow Up Recommendations Other (comment);Home health PT(HHPT @ ALF)    Equipment Recommendations  None recommended by PT    Recommendations for Other Services       Precautions / Restrictions Precautions Precautions: Fall Restrictions Weight Bearing Restrictions: No      Mobility  Bed Mobility Overal bed mobility: Needs Assistance Bed Mobility: Supine to Sit     Supine to sit: +2 for safety/equipment;Min assist;Mod assist     General bed mobility comments: cues for sequence and physical assist to bring trunk to upright  Transfers Overall transfer level: Needs assistance Equipment used: Rolling walker (2 wheeled) Transfers: Sit to/from Stand Sit to Stand: Min assist;Mod assist;+2 physical assistance;+2 safety/equipment         General transfer comment: cues for transition position and use of UEs to self assist  Ambulation/Gait Ambulation/Gait assistance: Min assist;+2 physical assistance;+2 safety/equipment Ambulation Distance (Feet): 140 Feet Assistive device: Rolling walker (2 wheeled) Gait Pattern/deviations: Step-through pattern;Decreased step length - right;Decreased step length - left;Shuffle;Trunk flexed;Wide base of support Gait velocity: decreased   General Gait Details: mod instability partially corrected with use of RW.  Physical assist for balance, support and RW management  Stairs            Wheelchair Mobility     Modified Rankin (Stroke Patients Only)       Balance Overall balance assessment: Needs assistance Sitting-balance support: Feet supported;No upper extremity supported Sitting balance-Leahy Scale: Fair     Standing balance support: Bilateral upper extremity supported Standing balance-Leahy Scale: Poor                               Pertinent Vitals/Pain Pain Assessment: No/denies pain    Home Living Family/patient expects to be discharged to:: Assisted living               Home Equipment: Walker - 2 wheels      Prior Function Level of Independence: Needs assistance   Gait / Transfers Assistance Needed: Supervised household gait with RW  ADL's / Homemaking Assistance Needed: assisted by ALF staff        Hand Dominance        Extremity/Trunk Assessment   Upper Extremity Assessment Upper Extremity Assessment: Generalized weakness;LUE deficits/detail LUE Deficits / Details: mod essential tremor noted with strength ~3+/5 LUE Coordination: decreased fine motor;decreased gross motor    Lower Extremity Assessment Lower Extremity Assessment: Generalized weakness    Cervical / Trunk Assessment Cervical / Trunk Assessment: Kyphotic  Communication   Communication: No difficulties  Cognition Arousal/Alertness: Awake/alert Behavior During Therapy: WFL for tasks assessed/performed Overall Cognitive Status: Within Functional Limits for tasks assessed                                        General Comments      Exercises  Assessment/Plan    PT Assessment Patient needs continued PT services  PT Problem List Decreased strength;Decreased activity tolerance;Decreased balance;Decreased mobility       PT Treatment Interventions Gait training;Functional mobility training;Therapeutic activities;Therapeutic exercise;Stair training;Patient/family education    PT Goals (Current goals can be found in the Care Plan section)  Acute  Rehab PT Goals Patient Stated Goal: return to ALF when able to walk better PT Goal Formulation: With patient Time For Goal Achievement: 09/07/17 Potential to Achieve Goals: Good    Frequency Min 3X/week   Barriers to discharge        Co-evaluation               AM-PAC PT "6 Clicks" Daily Activity  Outcome Measure Difficulty turning over in bed (including adjusting bedclothes, sheets and blankets)?: A Little Difficulty moving from lying on back to sitting on the side of the bed? : Unable Difficulty sitting down on and standing up from a chair with arms (e.g., wheelchair, bedside commode, etc,.)?: Unable Help needed moving to and from a bed to chair (including a wheelchair)?: A Lot Help needed walking in hospital room?: A Lot Help needed climbing 3-5 steps with a railing? : Total 6 Click Score: 10    End of Session Equipment Utilized During Treatment: Gait belt Activity Tolerance: Patient tolerated treatment well;Patient limited by fatigue Patient left: in chair;with call bell/phone within reach;with chair alarm set Nurse Communication: Mobility status PT Visit Diagnosis: Unsteadiness on feet (R26.81);Other abnormalities of gait and mobility (R26.89);Muscle weakness (generalized) (M62.81)    Time: 2297-9892 PT Time Calculation (min) (ACUTE ONLY): 27 min   Charges:   PT Evaluation $PT Eval Moderate Complexity: 1 Mod PT Treatments $Gait Training: 8-22 mins   PT G Codes:        Pg [phone]   Laurie Moreno 09/09/2017, 1:18 PM

## 2017-09-09 NOTE — Progress Notes (Signed)
PROGRESS NOTE    Laurie Moreno  WEX:[id number] DOB: 1950-06-05 DOA: 09/07/2017 PCP: Salvadore Dom, MD   Brief Narrative:  The patient is a 67 year old female with PMH as below, which is significant for Bipolar disorder, Nephrolithiasis, COPD, and hypothyroidism and other comorbids. She was recently admitted for ESBL E. coli pyelonephritis and treated with 7 days of meropenem and discharged to SNF. Hydronephrosis was seen on CT, but was chronic and urology recommended outpatient follow up. She was scheduled to have L ureteral stent 5/8. She then presented again to ED 5/7 with complaints of fever/chills. She was transferred to [facility] for urology evaluation. She had fever to 103F. She was seen by urology who took her to the OR for Cystoscopy with leftretrograde pyelogram,left ureteral catheterization with aspiration of 350 cc of pus for culture,and leftureteral stent placement. Post operatively she remained on vent and was transferred to ICU for further care and was extubated on 09/08/17. She was transferred to [facility] 09/09/17 and is improving.   Assessment & Plan:   Principal Problem:   Urinary tract infection due to extended-spectrum beta lactamase (ESBL) producing Escherichia coli Active Problems:   Pyelonephritis   COPD (chronic obstructive pulmonary disease) (HCC)   Renal abscess   Respiratory failure with hypercapnia (HCC)  Acute Hypercarbic Respiratory Failure requiring Mechanical Ventilation -Status post extubation. -Tinea with DuoNeb breathing treatments 3 mL's nebulization every 6 hours as needed for wheezing and shortness of breath -Continue with pulmonary toilet and continue flutter valve and incentive spirometry  Sepsis 2/2 to Left sided Pyelonephritis with suspected ESBL E Coli status post cystoscopy left letter great pyelogram and left ureteral catheterization with aspiration of 350 mL's of pus for culture and left ureteral stent placement -Given bolus of 500 mL's  on 09/07/17 and continued on maintenance IV fluids at 50 mL's per hour -Blood cultures x2 showed no growth at 2 days -Urinalysis showed moderate hemoglobin, trace leukocytes, rare bacteria, 11-20 WBCs -Urine culture from Cystoscope showed greater than 100,000 colonies of E. Coli with sensitivities pending -Continue with empiric Meropenem at this time and de-escalate accordingly -Underwent cystoscopy left retrograde pyelogram and left ureteral catheterization with aspiration of 350 cc of pus was sent for culture and patient also underwent a left ureteral JJ stent placement by Dr. Gloriann Loan on 09/07/17  -Follow Cx's  -WBC is 4.0  AKI -Mild on admission.  Patient's BUN and creatinine went from 23/1.03 and is now 19/0.75 -Continue with IV fluid hydration with normal saline at 50 mils per hour -Avoid nephrotoxic medications if possible -Continue to monitor and repeat CMP in a.m.  Hyperkalemia -Improved.  Patient's potassium went from 5.2 is now 4.6 -Continue to monitor and repeat CMP in a.m.  HLD -Continue with Atorvastatin 40 mg p.o. nightly  Hypertension -Blood pressures are currently soft so we will hold antihypertensives at this time putting carvedilol 3.125 mg twice daily  Hypothyroidism -Continue with levothyroxine 125 mcg p.o. before breakfast  Normocytic Anemia -Hb/Hct went from 9.9/29.0 and is now 8.2/26.7 -Possibly dilutional drop -Continue to monitor for signs and symptoms of bleeding -Repeat CBC in a.m.  Bipolar Disorder/Anxiety/Depression -Continue with Divalproex 250 mg p.o. 3 times daily -Continue with escitalopram 10 mg p.o. every morning -Continue with clonazepam 2.5 mg twice daily  Acute Encephalopathy -Improving.  Continue to monitor closely  ? Gram + Methicillin Resistant Coag Negative Staph Species Bacteremia -Suspect Contamination -We will repeat blood cultures for verification -IV Vancomycin has been discontinued  Essential Tremors -C/w  Primidone 50 mill grams  nightly  DVT prophylaxis:  Code Status: FULL CODE Family Communication: No family present at bedside  Disposition Plan: Remain Inpatient and Anticipate Transfer to Telemetry in AM if Stable   Consultants:   PCCM Transfer  Urology  Procedures: CYSTOSCOPY WITH RETROGRADE PYELOGRAM/URETERAL STENT PLACEMENT POD 2   Antimicrobials:  Anti-infectives (From admission, onward)   Start     Dose/Rate Route Frequency Ordered Stop   09/08/17 2000  vancomycin (VANCOCIN) IVPB 1000 mg/200 mL premix  Status:  Discontinued     1,000 mg 200 mL/hr over 60 Minutes Intravenous Every 24 hours 09/08/17 1005 09/08/17 1450   09/08/17 0600  meropenem (MERREM) 1 g in sodium chloride 0.9 % 100 mL IVPB     1 g 200 mL/hr over 30 Minutes Intravenous Every 8 hours 09/08/17 0428     09/07/17 2245  meropenem (MERREM) 1 g in sodium chloride 0.9 % 100 mL IVPB     1 g 200 mL/hr over 30 Minutes Intravenous  Once 09/07/17 2232 09/08/17 0722   09/07/17 1900  levofloxacin (LEVAQUIN) IVPB 750 mg     750 mg 100 mL/hr over 90 Minutes Intravenous  Once 09/07/17 1857 09/07/17 2107   09/07/17 1900  aztreonam (AZACTAM) 2 g in sodium chloride 0.9 % 100 mL IVPB     2 g 200 mL/hr over 30 Minutes Intravenous  Once 09/07/17 1857 09/07/17 2007   09/07/17 1900  vancomycin (VANCOCIN) IVPB 1000 mg/200 mL premix     1,000 mg 200 mL/hr over 60 Minutes Intravenous  Once 09/07/17 1857 09/07/17 2115     Subjective: Seen and examined at bedside and states she is feeling better.  Did not complain of any abdominal pain at this time.  Still complained of having essential tremors.  No chest pain, shortness breath, nausea, vomiting or other complaints or concerns at this time  Objective: Vitals:   09/09/17 0300 09/09/17 0400 09/09/17 0500 09/09/17 0600  BP: (!) 96/53 (!) 101/57 (!) 94/53 (!) 107/55  Pulse: (!) 59 (!) 54 (!) 55 (!) 51  Resp: 15 14 15 17   Temp:      TempSrc:      SpO2: 95% 95% 94% 95%  Weight:      Height:         Intake/Output Summary (Last 24 hours) at 09/09/2017 0730 Last data filed at 09/09/2017 0600 Gross per 24 hour  Intake 799.54 ml  Output 3200 ml  Net -2400.46 ml   Filed Weights   09/07/17 1912  Weight: 87.5 kg (193 lb)   Examination: Physical Exam:  Constitutional: WN/WD obese Caucasian female in NAD and appears calm and comfortable Eyes: Lids and conjunctivae normal, sclerae anicteric  ENMT: External Ears, Nose appear normal. Grossly normal hearing. Mucous membranes are moist.  Neck: Appears normal, supple, no cervical masses, normal ROM, no appreciable thyromegaly; no JVD Respiratory: Diminished to auscultation bilaterally, no wheezing, rales, rhonchi or crackles. Normal respiratory effort and patient is not tachypenic. No accessory muscle use.  Cardiovascular: RRR, no murmurs / rubs / gallops. S1 and S2 auscultated. Trace LE edema  Abdomen: Soft, non-tender, Distended due to body habitus. No masses palpated. No appreciable hepatosplenomegaly. Bowel sounds positive x4.  GU: Deferred. Musculoskeletal: No clubbing / cyanosis of digits/nails. No joint deformity upper and lower extremities. Skin: No rashes, lesions, ulcers on a limited skin evaluation. No induration; Warm and dry.  Neurologic: CN 2-12 grossly intact with no focal deficits. Had some tremors Psychiatric: Normal judgment  and insight. Alert and oriented x 3. Normal mood and appropriate affect.   Data Reviewed: I have personally reviewed following labs and imaging studies  CBC: Recent Labs  Lab 09/06/17 0645 09/07/17 0025 09/07/17 1906 09/08/17 0021 09/08/17 0025 09/08/17 0344  WBC 5.6 6.1 5.7  --   --  4.2  NEUTROABS 3.4 3.5 3.4  --   --  3.3  HGB 9.8* 9.8* 9.4* 9.2* 9.9* 8.2*  HCT 31.4* 30.7* 30.0* 27.0* 29.0* 25.4*  MCV 93.5 93.0 92.6  --   --  93.7  PLT 272 268 255  --   --  811   Basic Metabolic Panel: Recent Labs  Lab 09/06/17 0730 09/07/17 0025 09/07/17 1906 09/08/17 0021 09/08/17 0025  09/08/17 0344  NA 138 137 138 139 139 138  K 4.7 5.0 4.5 5.0 5.1 5.2*  CL 101 99* 100*  --   --  105  CO2 30 28 27   --   --  23  GLUCOSE 194* 147* 154* 169*  --  235*  BUN 19 18 23*  --   --  20  CREATININE 0.72 0.79 1.03*  --   --  0.77  0.75  CALCIUM 8.5* 8.5* 8.7*  --   --  7.9*   GFR: Estimated Creatinine Clearance: 72.5 mL/min (by C-G formula based on SCr of 0.75 mg/dL). Liver Function Tests: Recent Labs  Lab 09/07/17 1906 09/08/17 0344  AST 21 14*  ALT 22 18  ALKPHOS 56 44  BILITOT 0.9 0.8  PROT 7.5 6.2*  ALBUMIN 2.9* 2.3*   No results for input(s): LIPASE, AMYLASE in the last 168 hours. No results for input(s): AMMONIA in the last 168 hours. Coagulation Profile: No results for input(s): INR, PROTIME in the last 168 hours. Cardiac Enzymes: Recent Labs  Lab 09/07/17 1925  TROPONINI <0.03   BNP (last 3 results) No results for input(s): PROBNP in the last 8760 hours. HbA1C: No results for input(s): HGBA1C in the last 72 hours. CBG: Recent Labs  Lab 09/08/17 0321 09/08/17 1953  GLUCAP 221* 240*   Lipid Profile: No results for input(s): CHOL, HDL, LDLCALC, TRIG, CHOLHDL, LDLDIRECT in the last 72 hours. Thyroid Function Tests: No results for input(s): TSH, T4TOTAL, FREET4, T3FREE, THYROIDAB in the last 72 hours. Anemia Panel: No results for input(s): VITAMINB12, FOLATE, FERRITIN, TIBC, IRON, RETICCTPCT in the last 72 hours. Sepsis Labs: Recent Labs  Lab 09/07/17 1913  LATICACIDVEN 1.00    Recent Results (from the past 240 hour(s))  Culture, blood (routine x 2)     Status: None (Preliminary result)   Collection Time: 09/07/17 12:26 AM  Result Value Ref Range Status   Specimen Description LEFT ANTECUBITAL DRAWN BY RN  Final   Special Requests   Final    BOTTLES DRAWN AEROBIC AND ANAEROBIC Blood Culture adequate volume   Culture   Final    NO GROWTH 1 DAY Performed at [facility], [address] [place], [place] [address]    Report Status PENDING   Incomplete  Culture, blood (routine x 2)     Status: Abnormal (Preliminary result)   Collection Time: 09/07/17 12:30 AM  Result Value Ref Range Status   Specimen Description   Final    BLOOD RIGHT ANTECUBITAL Performed at [facility], [place] [address] [place], [place] [address]    Special Requests   Final    BOTTLES DRAWN AEROBIC AND ANAEROBIC Blood Culture adequate volume Performed at [facility], [place]  [address] [place], [place] [address]    Culture  Setup Time   Final    GRAM POSITIVE COCCI Gram Stain Report Called to,Read Back By and Verified With: H.FRASER AT WL ON [id number] AT 0954A BY THOMPSON S. AEROBIC BOTTLE CRITICAL RESULT CALLED TO, READ BACK BY AND VERIFIED WITH: Mertha Baars [id number] CM Performed at [facility], [place] [address], [place], [place] [address]    Culture STAPHYLOCOCCUS SPECIES (COAGULASE NEGATIVE) (A)  Final   Report Status PENDING  Incomplete  Blood Culture ID Panel (Reflexed)     Status: Abnormal   Collection Time: 09/07/17 12:30 AM  Result Value Ref Range Status   Enterococcus species NOT DETECTED NOT DETECTED Final   Listeria monocytogenes NOT DETECTED NOT DETECTED Final   Staphylococcus species DETECTED (A) NOT DETECTED Final    Comment: Methicillin (oxacillin) resistant coagulase negative staphylococcus. Possible blood culture contaminant (unless isolated from more than one blood culture draw or clinical case suggests pathogenicity). No antibiotic treatment is indicated for blood  culture contaminants. CRITICAL RESULT CALLED TO, READ BACK BY AND VERIFIED WITH: PHARMD D ZEIGLER [id number] MLM    Staphylococcus aureus NOT DETECTED NOT DETECTED Final   Methicillin resistance DETECTED (A) NOT DETECTED Final    Comment: CRITICAL RESULT CALLED TO, READ BACK BY AND VERIFIED WITH: PHARMD D ZEIGLER [id number] CM    Streptococcus species NOT DETECTED NOT DETECTED Final   Streptococcus agalactiae NOT DETECTED NOT DETECTED Final    Streptococcus pneumoniae NOT DETECTED NOT DETECTED Final   Streptococcus pyogenes NOT DETECTED NOT DETECTED Final   Acinetobacter baumannii NOT DETECTED NOT DETECTED Final   Enterobacteriaceae species NOT DETECTED NOT DETECTED Final   Enterobacter cloacae complex NOT DETECTED NOT DETECTED Final   Escherichia coli NOT DETECTED NOT DETECTED Final   Klebsiella oxytoca NOT DETECTED NOT DETECTED Final   Klebsiella pneumoniae NOT DETECTED NOT DETECTED Final   Proteus species NOT DETECTED NOT DETECTED Final   Serratia marcescens NOT DETECTED NOT DETECTED Final   Haemophilus influenzae NOT DETECTED NOT DETECTED Final   Neisseria meningitidis NOT DETECTED NOT DETECTED Final   Pseudomonas aeruginosa NOT DETECTED NOT DETECTED Final   Candida albicans NOT DETECTED NOT DETECTED Final   Candida glabrata NOT DETECTED NOT DETECTED Final   Candida krusei NOT DETECTED NOT DETECTED Final   Candida parapsilosis NOT DETECTED NOT DETECTED Final   Candida tropicalis NOT DETECTED NOT DETECTED Final    Comment: Performed at [facility], [place] [address], [place], [place] [address]  Blood Culture (routine x 2)     Status: None (Preliminary result)   Collection Time: 09/07/17  7:07 PM  Result Value Ref Range Status   Specimen Description BLOOD RIGHT FOREARM  Final   Special Requests   Final    BOTTLES DRAWN AEROBIC AND ANAEROBIC Blood Culture adequate volume   Culture   Final    NO GROWTH < 12 HOURS Performed at [facility], [address] [place], [place] [address]    Report Status PENDING  Incomplete  Blood Culture (routine x 2)     Status: None (Preliminary result)   Collection Time: 09/07/17  7:11 PM  Result Value Ref Range Status   Specimen Description BLOOD LEFT FOREARM  Final   Special Requests   Final    BOTTLES DRAWN AEROBIC AND ANAEROBIC Blood Culture adequate volume   Culture   Final    NO GROWTH < 12 HOURS Performed at [facility], [place]  [address] [place], [place] [address]    Report  Status PENDING  Incomplete  MRSA PCR Screening     Status: None   Collection Time: 09/08/17  3:30 AM  Result Value Ref Range Status   MRSA by PCR NEGATIVE NEGATIVE Final    Comment:        The GeneXpert MRSA Assay (FDA approved for NASAL specimens only), is one component of a comprehensive MRSA colonization surveillance program. It is not intended to diagnose MRSA infection nor to guide or monitor treatment for MRSA infections. Performed at [facility], [place] [address] [place], [place] [address]     Radiology Studies: Dg Chest Port 1 View  Result Date: 09/08/2017 CLINICAL DATA:  67 year old female with endotracheal tube. Subsequent encounter. EXAM: PORTABLE CHEST 1 VIEW COMPARISON:  09/08/2017 1:02 a.m. FINDINGS: Endotracheal tube tip 1.3 cm above the carina. Slight decrease in degree of pulmonary vascular congestion. Cardiomegaly and prominent mediastinum unchanged. Slightly elevated right hemidiaphragm. No acute osseous abnormality. IMPRESSION: Slight decrease in degree of pulmonary vascular congestion. Cardiomegaly and prominent mediastinum unchanged. Endotracheal tube tip 1.3 cm above the carina. Electronically Signed   By: Genia Del M.D.   On: 09/08/2017 06:56   Dg Chest Port 1 View  Result Date: 09/08/2017 CLINICAL DATA:  Initial evaluation for endotracheal tube placement. Respiratory failure. EXAM: PORTABLE CHEST 1 VIEW COMPARISON:  Prior radiograph from 09/07/2017. FINDINGS: Endotracheal tube in place with tip positioned approximately 11 mm above the carina. Cardiomegaly, stable. Mediastinal silhouette within normal limits. Lungs are hypoinflated. Diffuse vascular congestion with bronchovascular crowding and bibasilar atelectatic changes. No definite new focal infiltrates. No pneumothorax. No acute osseous abnormality. IMPRESSION: 1. Tip of the endotracheal tube positioned 11 mm above the carina. 2. Low lung volumes with secondary bibasilar  atelectasis/bronchovascular crowding. 3. Stable cardiomegaly with diffuse pulmonary vascular congestion without overt edema. Electronically Signed   By: Jeannine Boga M.D.   On: 09/08/2017 02:31   Dg Chest Port 1 View  Result Date: 09/07/2017 CLINICAL DATA:  67 year old female with fever for 2 days.  Hypoxia. EXAM: PORTABLE CHEST 1 VIEW COMPARISON:  Portable chest 08/25/2017 and earlier. FINDINGS: Portable AP upright view at 1905 hours. Lower lung volumes and persistent lordotic positioning. Stable mild cardiomegaly. Other mediastinal contours are within normal limits. Visualized tracheal air column is within normal limits. Allowing for portable technique the lungs are clear. Paucity of bowel gas in the upper abdomen. No acute osseous abnormality identified. IMPRESSION: Low lung volumes.  No acute cardiopulmonary abnormality. Electronically Signed   By: Genevie Ann M.D.   On: 09/07/2017 19:38   Dg C-arm 1-60 Min-no Report  Result Date: 09/07/2017 Fluoroscopy was utilized by the requesting physician.  No radiographic interpretation.   Scheduled Meds: . aspirin  81 mg Oral q morning - 10a  . atorvastatin  40 mg Oral QHS  . divalproex  250 mg Oral TID  . enoxaparin (LOVENOX) injection  40 mg Subcutaneous Q24H  . escitalopram  10 mg Oral q morning - 10a  . levothyroxine  125 mcg Oral QAC breakfast  . loratadine  10 mg Oral q morning - 10a  . multivitamin with minerals  1 tablet Oral q morning - 10a  . primidone  50 mg Oral QHS   Continuous Infusions: . sodium chloride 50 mL/hr at 09/08/17 1900  . famotidine (PEPCID) IV Stopped (09/08/17 2147)  . fentaNYL infusion INTRAVENOUS Stopped (09/08/17 0905)  . meropenem (MERREM) IV Stopped (09/09/17 0603)  . sodium chloride  LOS: 1 day   Kerney Elbe, DO [facility] Pager [phone]  If 7PM-7AM, please contact night-coverage [email or web address] Password [email or web address] 09/09/2017, 7:30 AM

## 2017-09-10 LAB — CBC WITH DIFFERENTIAL/PLATELET
BASOS ABS: 0 10*3/uL (ref 0.0–0.1)
Basophils Relative: 1 %
Eosinophils Absolute: 0 10*3/uL (ref 0.0–0.7)
Eosinophils Relative: 1 %
HCT: 26.8 % — ABNORMAL LOW (ref 36.0–46.0)
Hemoglobin: 8.3 g/dL — ABNORMAL LOW (ref 12.0–15.0)
LYMPHS PCT: 44 %
Lymphs Abs: 1.8 10*3/uL (ref 0.7–4.0)
MCH: 29.5 pg (ref 26.0–34.0)
MCHC: 31 g/dL (ref 30.0–36.0)
MCV: 95.4 fL (ref 78.0–100.0)
Monocytes Absolute: 0.4 10*3/uL (ref 0.1–1.0)
Monocytes Relative: 9 %
NEUTROS ABS: 1.8 10*3/uL (ref 1.7–7.7)
Neutrophils Relative %: 45 %
PLATELETS: 212 10*3/uL (ref 150–400)
RBC: 2.81 MIL/uL — AB (ref 3.87–5.11)
RDW: 13.5 % (ref 11.5–15.5)
WBC: 4 10*3/uL (ref 4.0–10.5)

## 2017-09-10 LAB — COMPREHENSIVE METABOLIC PANEL
ALBUMIN: 2.3 g/dL — AB (ref 3.5–5.0)
ALT: 18 U/L (ref 14–54)
AST: 16 U/L (ref 15–41)
Alkaline Phosphatase: 42 U/L (ref 38–126)
Anion gap: 11 (ref 5–15)
BUN: 23 mg/dL — AB (ref 6–20)
CHLORIDE: 106 mmol/L (ref 101–111)
CO2: 26 mmol/L (ref 22–32)
CREATININE: 1 mg/dL (ref 0.44–1.00)
Calcium: 8.4 mg/dL — ABNORMAL LOW (ref 8.9–10.3)
GFR calc Af Amer: >60 mL/min (ref >60)
GFR, EST NON AFRICAN AMERICAN: 57 mL/min — AB (ref >60)
GLUCOSE: 159 mg/dL — AB (ref 65–99)
Potassium: 4.6 mmol/L (ref 3.5–5.1)
Sodium: 143 mmol/L (ref 135–145)
Total Bilirubin: 0.4 mg/dL (ref 0.3–1.2)
Total Protein: 5.8 g/dL — ABNORMAL LOW (ref 6.5–8.1)

## 2017-09-10 LAB — CULTURE, BLOOD (ROUTINE X 2): SPECIAL REQUESTS: ADEQUATE

## 2017-09-10 LAB — PHOSPHORUS: Phosphorus: 4.2 mg/dL (ref 2.5–4.6)

## 2017-09-10 LAB — MAGNESIUM: MAGNESIUM: 1.8 mg/dL (ref 1.7–2.4)

## 2017-09-10 MED ORDER — CARVEDILOL 3.125 MG PO TABS
3.1250 mg | ORAL_TABLET | Freq: Two times a day (BID) | ORAL | Status: DC
  Administered 2017-09-10 – 2017-09-13 (×6): 3.125 mg via ORAL
  Filled 2017-09-10 (×6): qty 1

## 2017-09-10 MED ORDER — SENNOSIDES-DOCUSATE SODIUM 8.6-50 MG PO TABS
1.0000 | ORAL_TABLET | Freq: Two times a day (BID) | ORAL | Status: DC
  Administered 2017-09-10 – 2017-09-13 (×6): 1 via ORAL
  Filled 2017-09-10 (×7): qty 1

## 2017-09-10 MED ORDER — TRAZODONE HCL 50 MG PO TABS
50.0000 mg | ORAL_TABLET | Freq: Once | ORAL | Status: AC
  Administered 2017-09-10: 50 mg via ORAL
  Filled 2017-09-10: qty 1

## 2017-09-10 MED ORDER — BISACODYL 10 MG RE SUPP: 10.0000 mg | Freq: Every day | RECTAL | Status: DC | PRN

## 2017-09-10 MED ORDER — POLYETHYLENE GLYCOL 3350 17 G PO PACK
17.0000 g | PACK | Freq: Two times a day (BID) | ORAL | Status: DC
  Administered 2017-09-10 – 2017-09-11 (×3): 17 g via ORAL
  Filled 2017-09-10 (×7): qty 1

## 2017-09-10 NOTE — Progress Notes (Signed)
Physical Therapy Treatment Patient Details Name: Laurie Moreno MRN: [id number] DOB: Jun 15, 1950 Today's Date: 09/10/2017    History of Present Illness Pt admitted with UTI and resp failure 2* COPD exacerbation.  Pt required intubation and extubated 09/08/17.  Pt with hx of bipolar and essential tremor    PT Comments    Pt very motivated and with noted improvement in activity tolerance and ambulatory balance.  This session ltd by onset urinary incontinence.   Follow Up Recommendations  Other (comment);Home health PT     Equipment Recommendations  None recommended by PT    Recommendations for Other Services       Precautions / Restrictions Precautions Precautions: Fall Restrictions Weight Bearing Restrictions: No    Mobility  Bed Mobility Overal bed mobility: Needs Assistance Bed Mobility: Supine to Sit     Supine to sit: Supervision     General bed mobility comments: Increased time  Transfers Overall transfer level: Needs assistance Equipment used: Rolling walker (2 wheeled) Transfers: Sit to/from Stand Sit to Stand: Min assist;Min guard         General transfer comment: cues for transition position and use of UEs to self assist  Ambulation/Gait Ambulation/Gait assistance: Min assist Ambulation Distance (Feet): 150 Feet Assistive device: Rolling walker (2 wheeled) Gait Pattern/deviations: Step-through pattern;Decreased step length - right;Decreased step length - left;Shuffle;Trunk flexed;Wide base of support Gait velocity: decreased   General Gait Details:  instability partially corrected with use of RW.  Physical assist for balance, support and RW management.  Distance ltd by incontinence   Stairs             Wheelchair Mobility    Modified Rankin (Stroke Patients Only)       Balance Overall balance assessment: Needs assistance Sitting-balance support: Feet supported;No upper extremity supported Sitting balance-Leahy Scale: Fair      Standing balance support: No upper extremity supported Standing balance-Leahy Scale: Poor                              Cognition Arousal/Alertness: Awake/alert Behavior During Therapy: WFL for tasks assessed/performed Overall Cognitive Status: Within Functional Limits for tasks assessed                                        Exercises      General Comments        Pertinent Vitals/Pain Pain Assessment: No/denies pain    Home Living                      Prior Function            PT Goals (current goals can now be found in the care plan section) Acute Rehab PT Goals Patient Stated Goal: return to ALF when able to walk better PT Goal Formulation: With patient Time For Goal Achievement: 09/07/17 Potential to Achieve Goals: Good Progress towards PT goals: Progressing toward goals    Frequency    Min 3X/week      PT Plan Current plan remains appropriate    Co-evaluation              AM-PAC PT "6 Clicks" Daily Activity  Outcome Measure  Difficulty turning over in bed (including adjusting bedclothes, sheets and blankets)?: A Little Difficulty moving from lying on back to sitting on the side of the bed? :  A Lot Difficulty sitting down on and standing up from a chair with arms (e.g., wheelchair, bedside commode, etc,.)?: Unable Help needed moving to and from a bed to chair (including a wheelchair)?: A Little Help needed walking in hospital room?: A Little Help needed climbing 3-5 steps with a railing? : A Lot 6 Click Score: 14    End of Session Equipment Utilized During Treatment: Gait belt Activity Tolerance: Patient tolerated treatment well Patient left: Other (comment)(BSC) Nurse Communication: Mobility status PT Visit Diagnosis: Unsteadiness on feet (R26.81);Other abnormalities of gait and mobility (R26.89);Muscle weakness (generalized) (M62.81)     Time: 2482-5003 PT Time Calculation (min) (ACUTE ONLY): 18  min  Charges:  $Gait Training: 8-22 mins                    G Codes:       Pg [phone]    Ezekeil Bethel 09/10/2017, 12:36 PM

## 2017-09-10 NOTE — Progress Notes (Signed)
Physical Therapy Treatment Patient Details Name: Laurie Moreno MRN: [id number] DOB: [birth year] Today's Date: 09/10/2017    History of Present Illness Pt admitted with UTI and resp failure 2* COPD exacerbation.  Pt required intubation and extubated 09/08/17.  Pt with hx of bipolar and essential tremor    PT Comments    Pt very motivated and with noted improvement in activity tolerance and ambulatory balance.  Pt eager for return to previous living arrangement.   Follow Up Recommendations  Other (comment);Home health PT     Equipment Recommendations  None recommended by PT    Recommendations for Other Services       Precautions / Restrictions Precautions Precautions: Fall Restrictions Weight Bearing Restrictions: No    Mobility  Bed Mobility Overal bed mobility: Needs Assistance Bed Mobility: Supine to Sit     Supine to sit: Supervision     General bed mobility comments: Increased time  Transfers Overall transfer level: Needs assistance Equipment used: Rolling walker (2 wheeled) Transfers: Sit to/from Stand Sit to Stand: Min assist;Min guard Stand pivot transfers: Min assist       General transfer comment: cues for transition position and use of UEs to self assist  Ambulation/Gait Ambulation/Gait assistance: Min assist;Min guard Ambulation Distance (Feet): 400 Feet Assistive device: Rolling walker (2 wheeled) Gait Pattern/deviations: Step-through pattern;Decreased step length - right;Decreased step length - left;Shuffle;Trunk flexed;Wide base of support Gait velocity: decreased   General Gait Details:  instability partially corrected with use of RW.  Physical assist for balance, support and RW management.  Distance ltd by incontinence   Stairs             Wheelchair Mobility    Modified Rankin (Stroke Patients Only)       Balance Overall balance assessment: Needs assistance Sitting-balance support: Feet supported;No upper extremity  supported Sitting balance-Leahy Scale: Fair     Standing balance support: No upper extremity supported Standing balance-Leahy Scale: Poor                              Cognition Arousal/Alertness: Awake/alert Behavior During Therapy: WFL for tasks assessed/performed Overall Cognitive Status: Within Functional Limits for tasks assessed                                        Exercises      General Comments        Pertinent Vitals/Pain Pain Assessment: No/denies pain    Home Living                      Prior Function            PT Goals (current goals can now be found in the care plan section) Acute Rehab PT Goals Patient Stated Goal: return to ALF when able to walk better PT Goal Formulation: With patient Time For Goal Achievement: 09/07/17 Potential to Achieve Goals: Good Progress towards PT goals: Progressing toward goals    Frequency    Min 3X/week      PT Plan Current plan remains appropriate    Co-evaluation              AM-PAC PT "6 Clicks" Daily Activity  Outcome Measure  Difficulty turning over in bed (including adjusting bedclothes, sheets and blankets)?: A Little Difficulty moving from lying on back to sitting on  the side of the bed? : A Lot Difficulty sitting down on and standing up from a chair with arms (e.g., wheelchair, bedside commode, etc,.)?: Unable Help needed moving to and from a bed to chair (including a wheelchair)?: A Little Help needed walking in hospital room?: A Little Help needed climbing 3-5 steps with a railing? : A Lot 6 Click Score: 14    End of Session Equipment Utilized During Treatment: Gait belt Activity Tolerance: Patient tolerated treatment well Patient left: in chair;with call bell/phone within reach;with chair alarm set Nurse Communication: Mobility status PT Visit Diagnosis: Unsteadiness on feet (R26.81);Other abnormalities of gait and mobility (R26.89);Muscle weakness  (generalized) (M62.81)     Time: 4401-0272 PT Time Calculation (min) (ACUTE ONLY): 19 min  Charges:  $Gait Training: 8-22 mins                    G Codes:       Pg [phone]    Laurie Moreno 09/10/2017, 12:40 PM

## 2017-09-10 NOTE — Progress Notes (Signed)
PROGRESS NOTE    Laurie Moreno  BDZ:[id number] DOB: 11-Jul-1950 DOA: 09/07/2017 PCP: Salvadore Dom, MD   Brief Narrative:  The patient is a 67 year old female with PMH as below, which is significant for Bipolar disorder, Nephrolithiasis, COPD, and hypothyroidism and other comorbids. She was recently admitted for ESBL E. coli pyelonephritis and treated with 7 days of meropenem and discharged to SNF. Hydronephrosis was seen on CT, but was chronic and urology recommended outpatient follow up. She was scheduled to have L ureteral stent 5/8. She then presented again to ED 5/7 with complaints of fever/chills. She was transferred to [facility] for urology evaluation. She had fever to 103F. She was seen by urology who took her to the OR for Cystoscopy with leftretrograde pyelogram,left ureteral catheterization with aspiration of 350 cc of pus for culture,and leftureteral stent placement. Post operatively she remained on vent and was transferred to ICU for further care and was extubated on 09/08/17. She was transferred to [facility] 09/09/17 and is improving.  She is currently continued on Meropenem and the sensitivities of the E. Coli pyelonephritis is still pending.  Assessment & Plan:   Principal Problem:   Urinary tract infection due to extended-spectrum beta lactamase (ESBL) producing Escherichia coli Active Problems:   Pyelonephritis   COPD (chronic obstructive pulmonary disease) (HCC)   Renal abscess   Respiratory failure with hypercapnia (HCC)  Acute Hypercarbic Respiratory Failure requiring Mechanical Ventilation -Status post extubation. -Tinea with DuoNeb breathing treatments 3 mL's nebulization every 6 hours as needed for wheezing and shortness of breath -Continue with pulmonary toilet and continue flutter valve and incentive spirometry  Sepsis 2/2 to Left sided Pyelonephritis with suspected ESBL E Coli status post cystoscopy left letter great pyelogram and left ureteral  catheterization with aspiration of 350 mL's of pus for culture and left ureteral stent placement -Given bolus of 500 mL's on 09/07/17 and continued on maintenance IV fluids at 50 mL's per hour and will now stop IVF -Repeat Blood cultures on 09/07/17 x2 showed no growth at 3 days and repeat Blood Cx Negative so far -Urinalysis showed moderate hemoglobin, trace leukocytes, rare bacteria, 11-20 WBCs -Urine culture from Cystoscope showed greater than 100,000 colonies of E. Coli with sensitivities pending -Continue with empiric Meropenem at this time and de-escalate accordingly based on Sensitivities  -Underwent cystoscopy left retrograde pyelogram and left ureteral catheterization with aspiration of 350 cc of pus was sent for culture and patient also underwent a left ureteral JJ stent placement by Dr. Gloriann Loan on 09/07/17  -Pain control with Oxycodone-Acetaminophen 1 tablet p.o. every 4 hours as needed as well as Fentanyl 25 mcg IV every 1 hour as needed for breakthrough pain -Discontinue Foley Catheter  -Follow Cx's  -WBC is 4.0  AKI -Mild on admission.  Patient's BUN and creatinine went from 23/1.03 and is now 19/0.75 -> 23/1.00 -Continued with IV fluid hydration with normal saline at 50 mL/s per hour but now will stop IVF  -Discontinue Foley Catheter  -Avoid nephrotoxic medications if possible -Continue to monitor and repeat CMP in a.m.  Hyperkalemia -Improved.  Patient's potassium went from 5.2 is now 4.6 -Continue to monitor and repeat CMP in a.m.  HLD -Continue with Atorvastatin 40 mg p.o. nightly  Hypertension -Blood pressures were soft so Held Carvedilol 3.125 mg twice daily but will resume to prevent Reflex Tachycardia   Hypothyroidism -Continue with Levothyroxine 125 mcg p.o. before breakfast  Normocytic Anemia -Hb/Hct went from 9.9/29.0 and is now 8.3/26.8 -Possibly dilutional  drop -Continue to monitor for signs and symptoms of bleeding -Repeat CBC in a.m.  Bipolar  Disorder/Anxiety/Depression -Continue with Divalproex 250 mg p.o. 3 times daily -Continue with Escitalopram 10 mg p.o. every morning -Continue with Clonazepam 2.5 mg twice daily  Acute Encephalopathy -Improved -Was likely fr. Continue to monitor closely  ? Gram + Methicillin Resistant Coag Negative Staph Species Bacteremia -Suspected Contamination -We will repeat blood cultures for verification and are negative so far -IV Vancomycin has been discontinued  Essential Tremors -C/w Primidone 50 mill grams nightly  DVT prophylaxis: Enoxaparin 40 mg sq q24h Code Status: FULL CODE Family Communication: No family present at bedside  Disposition Plan: Remain Inpatient and Anticipate D/C in next 24-48 hours pending Results of Sensitivities   Consultants:   PCCM Transfer  Urology  Procedures: CYSTOSCOPY WITH RETROGRADE PYELOGRAM/URETERAL STENT PLACEMENT POD 3   Antimicrobials:  Anti-infectives (From admission, onward)   Start     Dose/Rate Route Frequency Ordered Stop   09/08/17 2000  vancomycin (VANCOCIN) IVPB 1000 mg/200 mL premix  Status:  Discontinued     1,000 mg 200 mL/hr over 60 Minutes Intravenous Every 24 hours 09/08/17 1005 09/08/17 1450   09/08/17 0600  meropenem (MERREM) 1 g in sodium chloride 0.9 % 100 mL IVPB     1 g 200 mL/hr over 30 Minutes Intravenous Every 8 hours 09/08/17 0428     09/07/17 2245  meropenem (MERREM) 1 g in sodium chloride 0.9 % 100 mL IVPB     1 g 200 mL/hr over 30 Minutes Intravenous  Once 09/07/17 2232 09/08/17 0722   09/07/17 1900  levofloxacin (LEVAQUIN) IVPB 750 mg     750 mg 100 mL/hr over 90 Minutes Intravenous  Once 09/07/17 1857 09/07/17 2107   09/07/17 1900  aztreonam (AZACTAM) 2 g in sodium chloride 0.9 % 100 mL IVPB     2 g 200 mL/hr over 30 Minutes Intravenous  Once 09/07/17 1857 09/07/17 2007   09/07/17 1900  vancomycin (VANCOCIN) IVPB 1000 mg/200 mL premix     1,000 mg 200 mL/hr over 60 Minutes Intravenous  Once 09/07/17 1857  09/07/17 2115     Subjective: Seen and examined at bedside had no complaints overnight.  States she is feeling okay.  No chest pain, shortness breath, nausea, vomiting.  No other complaints or concerns at this time  Objective: Vitals:   09/10/17 1500 09/10/17 1620 09/10/17 1700 09/10/17 1800  BP:  (!) 158/80    Pulse: 66 66 67 82  Resp: (!) 27 (!) 23 (!) 24 16  Temp:  98.7 F (37.1 C)    TempSrc:  Oral    SpO2: 94% 93% 95% 94%  Weight:      Height:        Intake/Output Summary (Last 24 hours) at 09/10/2017 1910 Last data filed at 09/10/2017 1840 Gross per 24 hour  Intake 4250 ml  Output 1300 ml  Net 2950 ml   Filed Weights   09/07/17 1912  Weight: 87.5 kg (193 lb)   Examination: Physical Exam:  Constitutional: Well-nourished, well-developed obese Caucasian female who is currently in no acute distress appears calm and comfortable Eyes: And lids and conjunctive are normal ENMT: External ears nose appear normal.  Mucous membranes are moist sclera are anicteric Neck: Supple with no appreciable JVD Respiratory: Diminished to auscultation bilaterally with no appreciable wheezing, rales, rhonchi.  Unlabored breathing and no accessory muscle usage Cardiovascular: Regular rate rhythm.  No appreciable murmurs, rubs or gallops.  Mild  lower extremity edema Abdomen: Soft, nontender, distended secondary to body habitus.  Bowel sounds present GU: Deferred.  Had a Foley catheter in place Musculoskeletal: No contractures or cyanosis.  No joint deformities noted Skin: Warm and dry with no appreciable rash or lesions on limited skin evaluation Neurologic: Cranial nerves II through XII grossly intact with no appreciable focal deficits.  Had some tremors Psychiatric: Normal mood and affect.  Intact judgment intact.  Data Reviewed: I have personally reviewed following labs and imaging studies  CBC: Recent Labs  Lab 09/07/17 0025 09/07/17 1906 09/08/17 0021 09/08/17 0025 09/08/17 0344  09/09/17 1020 09/10/17 0323  WBC 6.1 5.7  --   --  4.2 4.0 4.0  NEUTROABS 3.5 3.4  --   --  3.3 2.1 1.8  HGB 9.8* 9.4* 9.2* 9.9* 8.2* 8.2* 8.3*  HCT 30.7* 30.0* 27.0* 29.0* 25.4* 26.7* 26.8*  MCV 93.0 92.6  --   --  93.7 95.4 95.4  PLT 268 255  --   --  215 197 174   Basic Metabolic Panel: Recent Labs  Lab 09/07/17 0025 09/07/17 1906 09/08/17 0021 09/08/17 0025 09/08/17 0344 09/09/17 1020 09/10/17 0323  NA 137 138 139 139 138 143 143  K 5.0 4.5 5.0 5.1 5.2* 4.6 4.6  CL 99* 100*  --   --  105 109 106  CO2 28 27  --   --  23 26 26   GLUCOSE 147* 154* 169*  --  235* 179* 159*  BUN 18 23*  --   --  20 19 23*  CREATININE 0.79 1.03*  --   --  0.77  0.75 0.75 1.00  CALCIUM 8.5* 8.7*  --   --  7.9* 8.7* 8.4*  MG  --   --   --   --   --  1.9 1.8  PHOS  --   --   --   --   --  3.0 4.2   GFR: Estimated Creatinine Clearance: 58 mL/min (by C-G formula based on SCr of 1 mg/dL). Liver Function Tests: Recent Labs  Lab 09/07/17 1906 09/08/17 0344 09/09/17 1020 09/10/17 0323  AST 21 14* 19 16  ALT 22 18 19 18   ALKPHOS 56 44 43 42  BILITOT 0.9 0.8 0.4 0.4  PROT 7.5 6.2* 6.0* 5.8*  ALBUMIN 2.9* 2.3* 2.3* 2.3*   No results for input(s): LIPASE, AMYLASE in the last 168 hours. No results for input(s): AMMONIA in the last 168 hours. Coagulation Profile: No results for input(s): INR, PROTIME in the last 168 hours. Cardiac Enzymes: Recent Labs  Lab 09/07/17 1925  TROPONINI <0.03   BNP (last 3 results) No results for input(s): PROBNP in the last 8760 hours. HbA1C: No results for input(s): HGBA1C in the last 72 hours. CBG: Recent Labs  Lab 09/08/17 0321 09/08/17 1953  GLUCAP 221* 240*   Lipid Profile: No results for input(s): CHOL, HDL, LDLCALC, TRIG, CHOLHDL, LDLDIRECT in the last 72 hours. Thyroid Function Tests: No results for input(s): TSH, T4TOTAL, FREET4, T3FREE, THYROIDAB in the last 72 hours. Anemia Panel: No results for input(s): VITAMINB12, FOLATE, FERRITIN,  TIBC, IRON, RETICCTPCT in the last 72 hours. Sepsis Labs: Recent Labs  Lab 09/07/17 1913  LATICACIDVEN 1.00    Recent Results (from the past 240 hour(s))  Culture, blood (routine x 2)     Status: None (Preliminary result)   Collection Time: 09/07/17 12:26 AM  Result Value Ref Range Status   Specimen Description LEFT ANTECUBITAL DRAWN BY RN  Final   Special Requests   Final    BOTTLES DRAWN AEROBIC AND ANAEROBIC Blood Culture adequate volume   Culture   Final    NO GROWTH 3 DAYS Performed at [facility], [address] [place], [place] [address]    Report Status PENDING  Incomplete  Culture, blood (routine x 2)     Status: Abnormal   Collection Time: 09/07/17 12:30 AM  Result Value Ref Range Status   Specimen Description   Final    BLOOD RIGHT ANTECUBITAL Performed at [facility], [place] [address], [place], [place] [address]    Special Requests   Final    BOTTLES DRAWN AEROBIC AND ANAEROBIC Blood Culture adequate volume Performed at [place]., [place], [place] [address]    Culture  Setup Time   Final    GRAM POSITIVE COCCI AEROBIC BOTTLE ONLY Gram Stain Report Called to,Read Back By and Verified With: H.FRASER AT [facility] ON [id number] AT [id number] BY THOMPSON S. CRITICAL RESULT CALLED TO, READ BACK BY AND VERIFIED WITH: PHARMD D ZEIGLER [id number] CM    Culture (A)  Final    STAPHYLOCOCCUS SPECIES (COAGULASE NEGATIVE) THE SIGNIFICANCE OF ISOLATING THIS ORGANISM FROM A SINGLE SET OF BLOOD CULTURES WHEN MULTIPLE SETS ARE DRAWN IS UNCERTAIN. PLEASE NOTIFY THE MICROBIOLOGY DEPARTMENT WITHIN ONE WEEK IF SPECIATION AND SENSITIVITIES ARE REQUIRED. Performed at [facility], [place] [address] [place], [place] [address]    Report Status 09/10/2017 FINAL  Final  Blood Culture ID Panel (Reflexed)     Status: Abnormal   Collection Time: 09/07/17 12:30 AM  Result Value Ref Range Status   Enterococcus species NOT DETECTED NOT DETECTED Final   Listeria monocytogenes NOT  DETECTED NOT DETECTED Final   Staphylococcus species DETECTED (A) NOT DETECTED Final    Comment: Methicillin (oxacillin) resistant coagulase negative staphylococcus. Possible blood culture contaminant (unless isolated from more than one blood culture draw or clinical case suggests pathogenicity). No antibiotic treatment is indicated for blood  culture contaminants. CRITICAL RESULT CALLED TO, READ BACK BY AND VERIFIED WITH: PHARMD D ZEIGLER [id number] MLM    Staphylococcus aureus NOT DETECTED NOT DETECTED Final   Methicillin resistance DETECTED (A) NOT DETECTED Final    Comment: CRITICAL RESULT CALLED TO, READ BACK BY AND VERIFIED WITH: PHARMD D ZEIGLER [id number] CM    Streptococcus species NOT DETECTED NOT DETECTED Final   Streptococcus agalactiae NOT DETECTED NOT DETECTED Final   Streptococcus pneumoniae NOT DETECTED NOT DETECTED Final   Streptococcus pyogenes NOT DETECTED NOT DETECTED Final   Acinetobacter baumannii NOT DETECTED NOT DETECTED Final   Enterobacteriaceae species NOT DETECTED NOT DETECTED Final   Enterobacter cloacae complex NOT DETECTED NOT DETECTED Final   Escherichia coli NOT DETECTED NOT DETECTED Final   Klebsiella oxytoca NOT DETECTED NOT DETECTED Final   Klebsiella pneumoniae NOT DETECTED NOT DETECTED Final   Proteus species NOT DETECTED NOT DETECTED Final   Serratia marcescens NOT DETECTED NOT DETECTED Final   Haemophilus influenzae NOT DETECTED NOT DETECTED Final   Neisseria meningitidis NOT DETECTED NOT DETECTED Final   Pseudomonas aeruginosa NOT DETECTED NOT DETECTED Final   Candida albicans NOT DETECTED NOT DETECTED Final   Candida glabrata NOT DETECTED NOT DETECTED Final   Candida krusei NOT DETECTED NOT DETECTED Final   Candida parapsilosis NOT DETECTED NOT DETECTED Final   Candida tropicalis NOT DETECTED NOT DETECTED Final    Comment: Performed at [facility], [place]. [place]  [address] [place], [place] [address]  Urine culture     Status: Abnormal    Collection Time: 09/07/17  6:58 PM  Result Value Ref Range Status   Specimen Description   Final    URINE, RANDOM Performed at [facility], [address], [place], [place] [address]    Special Requests   Final    NONE Performed at [facility], [address], [place], [place] [address]    Culture (A)  Final    <10,000 COLONIES/mL INSIGNIFICANT GROWTH Performed at [place] [address] [place], [place] [address]    Report Status 09/09/2017 FINAL  Final  Blood Culture (routine x 2)     Status: None (Preliminary result)   Collection Time: 09/07/17  7:07 PM  Result Value Ref Range Status   Specimen Description BLOOD RIGHT FOREARM  Final   Special Requests   Final    BOTTLES DRAWN AEROBIC AND ANAEROBIC Blood Culture adequate volume   Culture   Final    NO GROWTH 3 DAYS Performed at [facility], [address], [place], [place] [address]    Report Status PENDING  Incomplete  Blood Culture (routine x 2)     Status: None (Preliminary result)   Collection Time: 09/07/17  7:11 PM  Result Value Ref Range Status   Specimen Description BLOOD LEFT FOREARM  Final   Special Requests   Final    BOTTLES DRAWN AEROBIC AND ANAEROBIC Blood Culture adequate volume   Culture   Final    NO GROWTH 3 DAYS Performed at [facility], [address] [place], [place] [address]    Report Status PENDING  Incomplete  Urine Culture     Status: Abnormal (Preliminary result)   Collection Time: 09/07/17 11:35 PM  Result Value Ref Range Status   Specimen Description   Final    URINE, CATHETERIZED CYTOSCOPE Performed at [facility], [place] [address], [place], [place] [address]    Special Requests   Final    NONE Performed at [facility], [place] [address], [place], [place] [address]    Culture >=100,000 COLONIES/mL ESCHERICHIA COLI (A)  Final   Report Status PENDING  Incomplete  MRSA PCR Screening     Status: None   Collection Time: 09/08/17  3:30 AM    Result Value Ref Range Status   MRSA by PCR NEGATIVE NEGATIVE Final    Comment:        The GeneXpert MRSA Assay (FDA approved for NASAL specimens only), is one component of a comprehensive MRSA colonization surveillance program. It is not intended to diagnose MRSA infection nor to guide or monitor treatment for MRSA infections. Performed at [facility], [place] [address], [place], [place] [address]   Culture, blood (routine x 2)     Status: None (Preliminary result)   Collection Time: 09/09/17  5:43 PM  Result Value Ref Range Status   Specimen Description   Final    BLOOD RIGHT ARM Performed at [place] [address], [place], [place] [address]    Special Requests   Final    BOTTLES DRAWN AEROBIC ONLY Blood Culture adequate volume Performed at [place] [address], [place],  [address]    Culture   Final    NO GROWTH < 12 HOURS Performed at [place] [address], [place],  [address]    Report Status PENDING  Incomplete  Culture, blood (  routine x 2)     Status: None (Preliminary result)   Collection Time: 09/09/17  5:43 PM  Result Value Ref Range Status   Specimen Description   Final    BLOOD RIGHT WRIST Performed at [place] [address] [place], [place] [address]    Special Requests   Final    BOTTLES DRAWN AEROBIC ONLY Blood Culture adequate volume Performed at [place] [address], [place], [place] [address]    Culture   Final    NO GROWTH < 12 HOURS Performed at [place] [address] [place],  [address]    Report Status PENDING  Incomplete    Radiology Studies: No results found. Scheduled Meds: . aspirin  81 mg Oral q morning - 10a  . atorvastatin  40 mg Oral QHS  . divalproex  250 mg Oral TID  . enoxaparin (LOVENOX) injection  40 mg Subcutaneous Q24H  . escitalopram  10 mg Oral q morning -  10a  . levothyroxine  125 mcg Oral QAC breakfast  . loratadine  10 mg Oral q morning - 10a  . multivitamin with minerals  1 tablet Oral q morning - 10a  . polyethylene glycol  17 g Oral BID  . primidone  50 mg Oral QHS  . senna-docusate  1 tablet Oral BID   Continuous Infusions: . sodium chloride 1,000 mL (09/10/17 1229)  . famotidine (PEPCID) IV Stopped (09/10/17 1106)  . fentaNYL infusion INTRAVENOUS Stopped (09/08/17 0905)  . meropenem (MERREM) IV Stopped (09/10/17 1629)  . sodium chloride      LOS: 2 days   Kerney Elbe, DO [facility] Pager [phone]  If 7PM-7AM, please contact night-coverage [email or web address] Password [email or web address] 09/10/2017, 7:10 PM

## 2017-09-11 ENCOUNTER — Inpatient Hospital Stay: Payer: Self-pay

## 2017-09-11 LAB — CBC WITH DIFFERENTIAL/PLATELET
Basophils Absolute: 0 10*3/uL (ref 0.0–0.1)
Basophils Relative: 1 %
EOS PCT: 1 %
Eosinophils Absolute: 0.1 10*3/uL (ref 0.0–0.7)
HCT: 27.4 % — ABNORMAL LOW (ref 36.0–46.0)
HEMOGLOBIN: 8.7 g/dL — AB (ref 12.0–15.0)
LYMPHS ABS: 1.8 10*3/uL (ref 0.7–4.0)
Lymphocytes Relative: 49 %
MCH: 29.3 pg (ref 26.0–34.0)
MCHC: 31.8 g/dL (ref 30.0–36.0)
MCV: 92.3 fL (ref 78.0–100.0)
MONO ABS: 0.3 10*3/uL (ref 0.1–1.0)
MONOS PCT: 9 %
NEUTROS PCT: 40 %
Neutro Abs: 1.4 10*3/uL — ABNORMAL LOW (ref 1.7–7.7)
Platelets: 211 10*3/uL (ref 150–400)
RBC: 2.97 MIL/uL — ABNORMAL LOW (ref 3.87–5.11)
RDW: 13.2 % (ref 11.5–15.5)
WBC: 3.5 10*3/uL — ABNORMAL LOW (ref 4.0–10.5)

## 2017-09-11 LAB — COMPREHENSIVE METABOLIC PANEL
ALK PHOS: 47 U/L (ref 38–126)
ALT: 17 U/L (ref 14–54)
ANION GAP: 11 (ref 5–15)
AST: 13 U/L — ABNORMAL LOW (ref 15–41)
Albumin: 2.6 g/dL — ABNORMAL LOW (ref 3.5–5.0)
BUN: 16 mg/dL (ref 6–20)
CALCIUM: 8.6 mg/dL — AB (ref 8.9–10.3)
CO2: 26 mmol/L (ref 22–32)
Chloride: 106 mmol/L (ref 101–111)
Creatinine, Ser: 0.72 mg/dL (ref 0.44–1.00)
GFR calc non Af Amer: >60 mL/min (ref >60)
Glucose, Bld: 158 mg/dL — ABNORMAL HIGH (ref 65–99)
Potassium: 4 mmol/L (ref 3.5–5.1)
Sodium: 143 mmol/L (ref 135–145)
TOTAL PROTEIN: 6.2 g/dL — AB (ref 6.5–8.1)
Total Bilirubin: 0.6 mg/dL (ref 0.3–1.2)

## 2017-09-11 LAB — URINE CULTURE

## 2017-09-11 LAB — PHOSPHORUS: PHOSPHORUS: 3.9 mg/dL (ref 2.5–4.6)

## 2017-09-11 LAB — MAGNESIUM: Magnesium: 1.8 mg/dL (ref 1.7–2.4)

## 2017-09-11 MED ORDER — SODIUM CHLORIDE 0.9% FLUSH
10.0000 mL | Freq: Two times a day (BID) | INTRAVENOUS | Status: DC
  Administered 2017-09-11 – 2017-09-12 (×2): 10 mL

## 2017-09-11 MED ORDER — TRAZODONE HCL 50 MG PO TABS
50.0000 mg | ORAL_TABLET | Freq: Once | ORAL | Status: AC
  Administered 2017-09-11: 50 mg via ORAL
  Filled 2017-09-11: qty 1

## 2017-09-11 MED ORDER — SODIUM CHLORIDE 0.9% FLUSH: 10.0000 mL | INTRAVENOUS | Status: DC | PRN

## 2017-09-11 NOTE — Progress Notes (Signed)
Pt. Arrived/transferred to floor from ICU. Pt.  alert and oriented x 4. No respiratory distress noted.

## 2017-09-11 NOTE — Progress Notes (Signed)
PROGRESS NOTE    Laurie Moreno  LYY:[id number] DOB: 04/17/1951 DOA: 09/07/2017 PCP: Salvadore Dom, MD   Brief Narrative:  The patient is a 67 year old female with PMH as below, which is significant for Bipolar disorder, Nephrolithiasis, COPD, and hypothyroidism and other comorbids. She was recently admitted for ESBL E. coli pyelonephritis and treated with 7 days of meropenem and discharged to SNF. Hydronephrosis was seen on CT, but was chronic and urology recommended outpatient follow up. She was scheduled to have L ureteral stent 5/8. She then presented again to ED 5/7 with complaints of fever/chills. She was transferred to [facility] for urology evaluation. She had fever to 103F. She was seen by urology who took her to the OR for Cystoscopy with leftretrograde pyelogram,left ureteral catheterization with aspiration of 350 cc of pus for culture,and leftureteral stent placement. Post operatively she remained on vent and was transferred to ICU for further care and was extubated on 09/08/17. She was transferred to [facility] 09/09/17 and is improving.  She is currently continued on Meropenem and the sensitivities of the E. Coli pyelonephritis showed no good options to be changed to po. She will be continued on meropenem for now and have a PICC line placed and then will change to ertapenem for the total duration of 2-week course.  Social work was consulted about discharge planning and is unclear if the patient can go back to her ALF with a PICC line and if she cannot we will attempt to find a SNF for her.  Assessment & Plan:   Principal Problem:   Urinary tract infection due to extended-spectrum beta lactamase (ESBL) producing Escherichia coli Active Problems:   Pyelonephritis   COPD (chronic obstructive pulmonary disease) (HCC)   Renal abscess   Respiratory failure with hypercapnia (HCC)  Acute Hypercarbic Respiratory Failure requiring Mechanical Ventilation, improved  -Status post  extubation. -Tinea with DuoNeb breathing treatments 3 mL's nebulization every 6 hours as needed for wheezing and shortness of breath -Continue with pulmonary toilet and continue flutter valve and incentive spirometry  Sepsis 2/2 to Left sided Pyelonephritis with suspected ESBL E Coli status post cystoscopy left letter great pyelogram and left ureteral catheterization with aspiration of 350 mL's of pus for culture and left ureteral stent placement -Given bolus of 500 mL's on 09/07/17 and continued on maintenance IV fluids at 50 mL's per hour and will now stop IVF -Repeat Blood cultures on 09/07/17 x2 showed no growth at 3 days and repeat Blood Cx Negative so far -Urinalysis showed moderate hemoglobin, trace leukocytes, rare bacteria, 11-20 WBCs -Urine culture from Cystoscope showed greater than 100,000 colonies of E. Coli with sensitivities showing that she is only sensitive to gentamicin, imipenem, Nitrofurantoin, and Zosyn; because there is no good options to change to p.o. we will continue the meropenem for now and have a PICC line placed and then changed to Ertapenem at discharge for once a day dosing -Continue with empiric Meropenem at this time and change to Ertapenem at D/C -Underwent cystoscopy left retrograde pyelogram and left ureteral catheterization with aspiration of 350 cc of pus was sent for culture and patient also underwent a left ureteral JJ stent placement by Dr. Gloriann Loan on 09/07/17  -Pain control with Oxycodone-Acetaminophen 1 tablet p.o. every 4 hours as needed as well as Fentanyl 25 mcg IV every 1 hour as needed for breakthrough pain -Discontinue Foley Catheter  -Follow Cx's  -WBC is 3.5 -Social work notified for assistance in placement to get this unclear  if the patient can go back to her assisted living facility in [place] with a PICC line and management of long-term antibiotics.  If she cannot Education officer, museum will be searching for a facility that can manage the PICC line  AKI,  improved -Mild on admission.  Patient's BUN and creatinine went from 23/1.03 -> 19/0.75 -> 23/1.00 -> 16/0.72 -IV fluid hydration with normal saline at 50 mL/s now stopped -Discontinue Foley Catheter  -Avoid Nephrotoxic medications if possible -Continue to monitor and repeat CMP in a.m.  Hyperkalemia -Improved.  Patient's Potassium went from 5.2 is now 4.0 -Continue to monitor and repeat CMP in a.m.  HLD -Continue with Atorvastatin 40 mg p.o. nightly  Hypertension -Blood pressures were soft recently but have now improved so Held Carvedilol 3.125 mg twice daily resumed to prevent Reflex Tachycardia  -Blood pressure is now 131/63  Hypothyroidism -Continue with Levothyroxine 125 mcg p.o. before breakfast  Normocytic Anemia -Hb/Hct went from 9.9/29.0 and is now stable at 8.7/27.4 -Possibly Dilutional drop from IV fluid hydration -Continue to monitor for signs and symptoms of bleeding -Repeat CBC in a.m.  Bipolar Disorder/Anxiety/Depression -Continue with Divalproex 250 mg p.o. 3 times daily -Continue with Escitalopram 10 mg p.o. every morning -Continue with Clonazepam 2.5 mg twice daily  Acute Encephalopathy -Improved -Was likely fr. Continue to monitor closely  ? Gram + Methicillin Resistant Coag Negative Staph Species Bacteremia -Suspected Contamination -We will repeat blood cultures for verification and they negative so far -IV Vancomycin has been discontinued  Essential Tremors -C/w Primidone 50 mill grams nightly  DVT prophylaxis: Enoxaparin 40 mg sq q24h Code Status: FULL CODE Family Communication: No family present at bedside  Disposition Plan: Remain Inpatient and transferred to [place]. Anticipate D/C to SNF/ALF PICC line is placed and Ertapenem is obtained via OPAT orders   Consultants:   PCCM Transfer  Urology  Procedures: CYSTOSCOPY WITH RETROGRADE PYELOGRAM/URETERAL STENT PLACEMENT POD 4   Antimicrobials:  Anti-infectives (From admission, onward)    Start     Dose/Rate Route Frequency Ordered Stop   09/08/17 2000  vancomycin (VANCOCIN) IVPB 1000 mg/200 mL premix  Status:  Discontinued     1,000 mg 200 mL/hr over 60 Minutes Intravenous Every 24 hours 09/08/17 1005 09/08/17 1450   09/08/17 0600  meropenem (MERREM) 1 g in sodium chloride 0.9 % 100 mL IVPB     1 g 200 mL/hr over 30 Minutes Intravenous Every 8 hours 09/08/17 0428     09/07/17 2245  meropenem (MERREM) 1 g in sodium chloride 0.9 % 100 mL IVPB     1 g 200 mL/hr over 30 Minutes Intravenous  Once 09/07/17 2232 09/08/17 0722   09/07/17 1900  levofloxacin (LEVAQUIN) IVPB 750 mg     750 mg 100 mL/hr over 90 Minutes Intravenous  Once 09/07/17 1857 09/07/17 2107   09/07/17 1900  aztreonam (AZACTAM) 2 g in sodium chloride 0.9 % 100 mL IVPB     2 g 200 mL/hr over 30 Minutes Intravenous  Once 09/07/17 1857 09/07/17 2007   09/07/17 1900  vancomycin (VANCOCIN) IVPB 1000 mg/200 mL premix     1,000 mg 200 mL/hr over 60 Minutes Intravenous  Once 09/07/17 1857 09/07/17 2115     Subjective: Seen and examined at bedside felt okay.  Had no complaints.  Denied any chest pain, shortness breath, nausea, vomiting.  Was concerned about discharge planning and wanting to know when she could be D/C'd. After sensitivities resulted I discussed this with her and discussed PICC  line and she is agreeable.  Objective: Vitals:   09/11/17 0332 09/11/17 0400 09/11/17 0600 09/11/17 0800  BP:  (!) 155/77  131/63  Pulse:  60  70  Resp:  (!) 21  14  Temp: 98.9 F (37.2 C)   98.9 F (37.2 C)  TempSrc: Oral   Oral  SpO2:  93%  94%  Weight:   88 kg (194 lb 0.1 oz)   Height:        Intake/Output Summary (Last 24 hours) at 09/11/2017 1136 Last data filed at 09/11/2017 0600 Gross per 24 hour  Intake 580 ml  Output 1000 ml  Net -420 ml   Filed Weights   09/07/17 1912 09/11/17 0600  Weight: 87.5 kg (193 lb) 88 kg (194 lb 0.1 oz)   Examination: Physical Exam:  Constitutional: Patient is a  well-nourished, well-developed obese Caucasian female who is currently in no acute distress appears calm and comfortable Eyes: Sclera are anicteric.  Lids and conjunctive are normal ENMT: External ears and nose appear normal.  Mucous membranes are moist Neck: Supple no JVD Respiratory: Diminished to auscultation bilaterally with no appreciable wheezing, rales, rhonchi.  Unlabored breathing and no accessory muscle usage Cardiovascular: Regular rate and rhythm.  No appreciable murmurs, rubs, gallops.  Trace lower extremity edema Abdomen: Soft, nontender, distended secondary to body habitus.  Bowel sounds are present GU: Deferred.  Foley catheter has been removed Musculoskeletal: No contractures or cyanosis noted.  No joint deformities Skin: Skin is warm and dry with no appreciable rashes or lesions on limited skin evaluation Neurologic: Cranial nerves II through XII no appreciable deficits.  Had some mild tremors today Psychiatric: Normal mood and affect.  Intact judgment insight.  Patient is awake alert and oriented x3  Data Reviewed: I have personally reviewed following labs and imaging studies  CBC: Recent Labs  Lab 09/07/17 1906  09/08/17 0025 09/08/17 0344 09/09/17 1020 09/10/17 0323 09/11/17 0348  WBC 5.7  --   --  4.2 4.0 4.0 3.5*  NEUTROABS 3.4  --   --  3.3 2.1 1.8 1.4*  HGB 9.4*   < > 9.9* 8.2* 8.2* 8.3* 8.7*  HCT 30.0*   < > 29.0* 25.4* 26.7* 26.8* 27.4*  MCV 92.6  --   --  93.7 95.4 95.4 92.3  PLT 255  --   --  215 197 212 211   < > = values in this interval not displayed.   Basic Metabolic Panel: Recent Labs  Lab 09/07/17 1906 09/08/17 0021 09/08/17 0025 09/08/17 0344 09/09/17 1020 09/10/17 0323 09/11/17 0348  NA 138 139 139 138 143 143 143  K 4.5 5.0 5.1 5.2* 4.6 4.6 4.0  CL 100*  --   --  105 109 106 106  CO2 27  --   --  23 26 26 26   GLUCOSE 154* 169*  --  235* 179* 159* 158*  BUN 23*  --   --  20 19 23* 16  CREATININE 1.03*  --   --  0.77  0.75 0.75  1.00 0.72  CALCIUM 8.7*  --   --  7.9* 8.7* 8.4* 8.6*  MG  --   --   --   --  1.9 1.8 1.8  PHOS  --   --   --   --  3.0 4.2 3.9   GFR: Estimated Creatinine Clearance: 72.7 mL/min (by C-G formula based on SCr of 0.72 mg/dL). Liver Function Tests: Recent Labs  Lab 09/07/17 1906 09/08/17  2130 09/09/17 1020 09/10/17 0323 09/11/17 0348  AST 21 14* 19 16 13*  ALT 22 18 19 18 17   ALKPHOS 56 44 43 42 47  BILITOT 0.9 0.8 0.4 0.4 0.6  PROT 7.5 6.2* 6.0* 5.8* 6.2*  ALBUMIN 2.9* 2.3* 2.3* 2.3* 2.6*   No results for input(s): LIPASE, AMYLASE in the last 168 hours. No results for input(s): AMMONIA in the last 168 hours. Coagulation Profile: No results for input(s): INR, PROTIME in the last 168 hours. Cardiac Enzymes: Recent Labs  Lab 09/07/17 1925  TROPONINI <0.03   BNP (last 3 results) No results for input(s): PROBNP in the last 8760 hours. HbA1C: No results for input(s): HGBA1C in the last 72 hours. CBG: Recent Labs  Lab 09/08/17 0321 09/08/17 1953  GLUCAP 221* 240*   Lipid Profile: No results for input(s): CHOL, HDL, LDLCALC, TRIG, CHOLHDL, LDLDIRECT in the last 72 hours. Thyroid Function Tests: No results for input(s): TSH, T4TOTAL, FREET4, T3FREE, THYROIDAB in the last 72 hours. Anemia Panel: No results for input(s): VITAMINB12, FOLATE, FERRITIN, TIBC, IRON, RETICCTPCT in the last 72 hours. Sepsis Labs: Recent Labs  Lab 09/07/17 1913  LATICACIDVEN 1.00    Recent Results (from the past 240 hour(s))  Culture, blood (routine x 2)     Status: None (Preliminary result)   Collection Time: 09/07/17 12:26 AM  Result Value Ref Range Status   Specimen Description LEFT ANTECUBITAL DRAWN BY RN  Final   Special Requests   Final    BOTTLES DRAWN AEROBIC AND ANAEROBIC Blood Culture adequate volume   Culture   Final    NO GROWTH 4 DAYS Performed at [facility], [address] [place], [place] [address]    Report Status PENDING  Incomplete  Culture, blood (routine x 2)      Status: Abnormal   Collection Time: 09/07/17 12:30 AM  Result Value Ref Range Status   Specimen Description   Final    BLOOD RIGHT ANTECUBITAL Performed at [facility], [place] [address], [place], [place] [address]    Special Requests   Final    BOTTLES DRAWN AEROBIC AND ANAEROBIC Blood Culture adequate volume Performed at [place] [place], [place] [address]    Culture  Setup Time   Final    GRAM POSITIVE COCCI AEROBIC BOTTLE ONLY Gram Stain Report Called to,Read Back By and Verified With: H.FRASER AT [facility] ON [id number] AT 0954A BY THOMPSON S. CRITICAL RESULT CALLED TO, READ BACK BY AND VERIFIED WITH: PHARMD D ZEIGLER [id number] CM    Culture (A)  Final    STAPHYLOCOCCUS SPECIES (COAGULASE NEGATIVE) THE SIGNIFICANCE OF ISOLATING THIS ORGANISM FROM A SINGLE SET OF BLOOD CULTURES WHEN MULTIPLE SETS ARE DRAWN IS UNCERTAIN. PLEASE NOTIFY THE MICROBIOLOGY DEPARTMENT WITHIN ONE WEEK IF SPECIATION AND SENSITIVITIES ARE REQUIRED. Performed at [facility], [place] [address] [place], [place] [address]    Report Status 09/10/2017 FINAL  Final  Blood Culture ID Panel (Reflexed)     Status: Abnormal   Collection Time: 09/07/17 12:30 AM  Result Value Ref Range Status   Enterococcus species NOT DETECTED NOT DETECTED Final   Listeria monocytogenes NOT DETECTED NOT DETECTED Final   Staphylococcus species DETECTED (A) NOT DETECTED Final    Comment: Methicillin (oxacillin) resistant coagulase negative staphylococcus. Possible blood culture contaminant (unless isolated from more than one blood culture draw or clinical case suggests pathogenicity). No antibiotic treatment is indicated for blood  culture contaminants. CRITICAL RESULT CALLED  TO, READ BACK BY AND VERIFIED WITH: PHARMD D ZEIGLER [id number] MLM    Staphylococcus aureus NOT DETECTED NOT DETECTED Final   Methicillin resistance DETECTED (A) NOT DETECTED Final    Comment: CRITICAL RESULT CALLED TO, READ BACK BY AND  VERIFIED WITH: PHARMD D ZEIGLER [id number] CM    Streptococcus species NOT DETECTED NOT DETECTED Final   Streptococcus agalactiae NOT DETECTED NOT DETECTED Final   Streptococcus pneumoniae NOT DETECTED NOT DETECTED Final   Streptococcus pyogenes NOT DETECTED NOT DETECTED Final   Acinetobacter baumannii NOT DETECTED NOT DETECTED Final   Enterobacteriaceae species NOT DETECTED NOT DETECTED Final   Enterobacter cloacae complex NOT DETECTED NOT DETECTED Final   Escherichia coli NOT DETECTED NOT DETECTED Final   Klebsiella oxytoca NOT DETECTED NOT DETECTED Final   Klebsiella pneumoniae NOT DETECTED NOT DETECTED Final   Proteus species NOT DETECTED NOT DETECTED Final   Serratia marcescens NOT DETECTED NOT DETECTED Final   Haemophilus influenzae NOT DETECTED NOT DETECTED Final   Neisseria meningitidis NOT DETECTED NOT DETECTED Final   Pseudomonas aeruginosa NOT DETECTED NOT DETECTED Final   Candida albicans NOT DETECTED NOT DETECTED Final   Candida glabrata NOT DETECTED NOT DETECTED Final   Candida krusei NOT DETECTED NOT DETECTED Final   Candida parapsilosis NOT DETECTED NOT DETECTED Final   Candida tropicalis NOT DETECTED NOT DETECTED Final    Comment: Performed at [facility], [place]. [address], [place], [place] [address]  Urine culture     Status: Abnormal   Collection Time: 09/07/17  6:58 PM  Result Value Ref Range Status   Specimen Description   Final    URINE, RANDOM Performed at [facility], [address], [place], [place] [address]    Special Requests   Final    NONE Performed at [facility], [address] [place], [place] [address]    Culture (A)  Final    <10,000 COLONIES/mL INSIGNIFICANT GROWTH Performed at [place] [address], [place], [place] [address]    Report Status 09/09/2017 FINAL  Final  Blood Culture (routine x 2)     Status: None (Preliminary result)   Collection Time: 09/07/17  7:07 PM  Result Value Ref Range Status   Specimen  Description BLOOD RIGHT FOREARM  Final   Special Requests   Final    BOTTLES DRAWN AEROBIC AND ANAEROBIC Blood Culture adequate volume   Culture   Final    NO GROWTH 4 DAYS Performed at [facility], [address], [place], [place] [address]    Report Status PENDING  Incomplete  Blood Culture (routine x 2)     Status: None (Preliminary result)   Collection Time: 09/07/17  7:11 PM  Result Value Ref Range Status   Specimen Description BLOOD LEFT FOREARM  Final   Special Requests   Final    BOTTLES DRAWN AEROBIC AND ANAEROBIC Blood Culture adequate volume   Culture   Final    NO GROWTH 4 DAYS Performed at [facility], [address] [place], [place] [address]    Report Status PENDING  Incomplete  Urine Culture     Status: Abnormal   Collection Time: 09/07/17 11:35 PM  Result Value Ref Range Status   Specimen Description   Final    URINE, CATHETERIZED CYTOSCOPE Performed at [facility], [place] [address] [place], [place] [address]    Special Requests   Final    NONE Performed at [facility],  [place] [address], [place], [place] [address]    Culture >=100,000 COLONIES/mL ESCHERICHIA COLI (A)  Final   Report Status 09/11/2017 FINAL  Final   Organism ID, Bacteria ESCHERICHIA COLI (A)  Final      Susceptibility   Escherichia coli - MIC*    AMPICILLIN >=32 RESISTANT Resistant     CEFAZOLIN >=64 RESISTANT Resistant     CEFTRIAXONE >=64 RESISTANT Resistant     CIPROFLOXACIN >=4 RESISTANT Resistant     GENTAMICIN <=1 SENSITIVE Sensitive     IMIPENEM <=0.25 SENSITIVE Sensitive     NITROFURANTOIN <=16 SENSITIVE Sensitive     TRIMETH/SULFA >=320 RESISTANT Resistant     AMPICILLIN/SULBACTAM >=32 RESISTANT Resistant     PIP/TAZO 16 SENSITIVE Sensitive     * >=100,000 COLONIES/mL ESCHERICHIA COLI  MRSA PCR Screening     Status: None   Collection Time: 09/08/17  3:30 AM  Result Value Ref Range Status   MRSA by PCR NEGATIVE NEGATIVE Final    Comment:         The GeneXpert MRSA Assay (FDA approved for NASAL specimens only), is one component of a comprehensive MRSA colonization surveillance program. It is not intended to diagnose MRSA infection nor to guide or monitor treatment for MRSA infections. Performed at [facility], [place] [address] [place], [place] [address]   Culture, blood (routine x 2)     Status: None (Preliminary result)   Collection Time: 09/09/17  5:43 PM  Result Value Ref Range Status   Specimen Description   Final    BLOOD RIGHT ARM Performed at [place] [address], [place], [place] [address]    Special Requests   Final    BOTTLES DRAWN AEROBIC ONLY Blood Culture adequate volume Performed at [place] [address] [place], [place] [address]    Culture   Final    NO GROWTH 2 DAYS Performed at [place] [address], [place], [place] [address]    Report Status PENDING  Incomplete  Culture, blood (routine x 2)     Status: None (Preliminary result)   Collection Time: 09/09/17  5:43 PM  Result Value Ref Range Status   Specimen Description   Final    BLOOD RIGHT WRIST Performed at [place] [address], [place], [place] [address]    Special Requests   Final    BOTTLES DRAWN AEROBIC ONLY Blood Culture adequate volume Performed at [place] [address] [place], [place] [address]    Culture   Final    NO GROWTH 2 DAYS Performed at [place] [address], [place], [place] [address]    Report Status PENDING  Incomplete    Radiology Studies: [place] Ekg Site Rite  Result Date: 09/11/2017 If Site Rite image not attached, placement could not be confirmed due to current cardiac rhythm.  Scheduled Meds: . aspirin  81 mg Oral q morning - 10a  . atorvastatin  40 mg Oral QHS  . carvedilol  3.125 mg Oral BID WC  . divalproex  250 mg Oral TID  . enoxaparin (LOVENOX)  injection  40 mg Subcutaneous Q24H  . escitalopram  10 mg Oral q morning - 10a  . levothyroxine  125 mcg Oral QAC breakfast  . loratadine  10 mg Oral q morning - 10a  . multivitamin with minerals  1 tablet Oral q morning - 10a  . polyethylene glycol  17 g Oral BID  . primidone  50 mg Oral QHS  . senna-docusate  1 tablet Oral BID   Continuous Infusions: . famotidine (PEPCID) IV Stopped (09/11/17 1113)  . meropenem (MERREM) IV Stopped (09/11/17 4268)  . sodium chloride      LOS: 3 days   Kerney Elbe, DO [facility] Pager [phone]  If 7PM-7AM, please contact night-coverage [email or web address] Password [facility] 09/11/2017, 11:36 AM

## 2017-09-11 NOTE — Progress Notes (Signed)
Pharmacy Antibiotic Note  Laurie Moreno is a 67 y.o. female admitted on 09/07/2017 with recent hx multi-drug resistant E.coli infection.  Pharmacy has been consulted for Meropenem dosing.  Plan: Continue Meropenem 1gm iv q8hr  Height: 5\' 3"  (160 cm) Weight: 194 lb 0.1 oz (88 kg) IBW/kg (Calculated) : 52.4  Temp (24hrs), Avg:98.7 F (37.1 C), Min:98.5 F (36.9 C), Max:98.9 F (37.2 C)  Recent Labs  Lab 09/07/17 1906 09/07/17 1913 09/08/17 0344 09/09/17 1020 09/10/17 0323 09/11/17 0348  WBC 5.7  --  4.2 4.0 4.0 3.5*  CREATININE 1.03*  --  0.77  0.75 0.75 1.00 0.72  LATICACIDVEN  --  1.00  --   --   --   --     Estimated Creatinine Clearance: 72.7 mL/min (by C-G formula based on SCr of 0.72 mg/dL).    Allergies  Allergen Reactions  . Penicillins Hives, Rash and Other (See Comments)    Not effective per patient. Pt does not know. She states it was when she was a child. Unknown    Antimicrobials this admission:  5/7 Aztreo/LVQ/Vanc x 1 5/8 Meropenem >> 5/8 Vancomycin resumed >>  Dose adjustments this admission:    Microbiology results:  5/7 BCx [phone]): 1 of 2 CoNS 5/7 BCx (1907): ngtd 5/7 Urince (random) < 10k colonies, insignificant growth 5/7 UCx (cystoscope): >100k Ecoli (Sens: gent, imip, nitro, zosyn) 5/8 MRSA PCR: negative 5/9 BCx: ngtd  Previous admit: 4/20 UCx: >100k E.coli (S gent, imip, nitro, zosyn) 4/20 BCx: NGF  Thank you for allowing pharmacy to be a part of this patient's care.  Gretta Arab PharmD, BCPS Pager [phone] 09/11/2017 10:18 AM

## 2017-09-12 LAB — COMPREHENSIVE METABOLIC PANEL
ALBUMIN: 2.7 g/dL — AB (ref 3.5–5.0)
ALK PHOS: 47 U/L (ref 38–126)
ALT: 19 U/L (ref 14–54)
ANION GAP: 10 (ref 5–15)
AST: 20 U/L (ref 15–41)
BUN: 14 mg/dL (ref 6–20)
CALCIUM: 8.9 mg/dL (ref 8.9–10.3)
CO2: 29 mmol/L (ref 22–32)
Chloride: 104 mmol/L (ref 101–111)
Creatinine, Ser: 0.83 mg/dL (ref 0.44–1.00)
GFR calc Af Amer: >60 mL/min (ref >60)
GFR calc non Af Amer: >60 mL/min (ref >60)
GLUCOSE: 158 mg/dL — AB (ref 65–99)
POTASSIUM: 4.4 mmol/L (ref 3.5–5.1)
SODIUM: 143 mmol/L (ref 135–145)
Total Bilirubin: 0.7 mg/dL (ref 0.3–1.2)
Total Protein: 6.5 g/dL (ref 6.5–8.1)

## 2017-09-12 LAB — CBC WITH DIFFERENTIAL/PLATELET
BASOS PCT: 1 %
Basophils Absolute: 0 10*3/uL (ref 0.0–0.1)
EOS ABS: 0.1 10*3/uL (ref 0.0–0.7)
EOS PCT: 3 %
HCT: 30.4 % — ABNORMAL LOW (ref 36.0–46.0)
Hemoglobin: 9.6 g/dL — ABNORMAL LOW (ref 12.0–15.0)
LYMPHS PCT: 46 %
Lymphs Abs: 1.8 10*3/uL (ref 0.7–4.0)
MCH: 29.4 pg (ref 26.0–34.0)
MCHC: 31.6 g/dL (ref 30.0–36.0)
MCV: 93 fL (ref 78.0–100.0)
Monocytes Absolute: 0.4 10*3/uL (ref 0.1–1.0)
Monocytes Relative: 9 %
Neutro Abs: 1.7 10*3/uL (ref 1.7–7.7)
Neutrophils Relative %: 43 %
PLATELETS: 202 10*3/uL (ref 150–400)
RBC: 3.27 MIL/uL — AB (ref 3.87–5.11)
RDW: 13.1 % (ref 11.5–15.5)
WBC: 4 10*3/uL (ref 4.0–10.5)

## 2017-09-12 LAB — CULTURE, BLOOD (ROUTINE X 2)
CULTURE: NO GROWTH
CULTURE: NO GROWTH
CULTURE: NO GROWTH
Special Requests: ADEQUATE
Special Requests: ADEQUATE
Special Requests: ADEQUATE

## 2017-09-12 LAB — MAGNESIUM: Magnesium: 1.9 mg/dL (ref 1.7–2.4)

## 2017-09-12 LAB — PHOSPHORUS: Phosphorus: 4.2 mg/dL (ref 2.5–4.6)

## 2017-09-12 NOTE — Progress Notes (Addendum)
PROGRESS NOTE    Laurie Moreno  XJO:[id number] DOB: 1950-12-30 DOA: 09/07/2017 PCP: Salvadore Dom, MD   Brief Narrative:  The patient is a 67 year old female with PMH as below, which is significant for Bipolar disorder, Nephrolithiasis, COPD, and hypothyroidism and other comorbids. She was recently admitted for ESBL E. coli pyelonephritis and treated with 7 days of meropenem and discharged to SNF. Hydronephrosis was seen on CT, but was chronic and urology recommended outpatient follow up. She was scheduled to have L ureteral stent 5/8. She then presented again to ED 5/7 with complaints of fever/chills. She was transferred to [facility] for urology evaluation. She had fever to 103F. She was seen by urology who took her to the OR for Cystoscopy with leftretrograde pyelogram,left ureteral catheterization with aspiration of 350 cc of pus for culture,and leftureteral stent placement. Post operatively she remained on vent and was transferred to ICU for further care and was extubated on 09/08/17. She was transferred to [facility] 09/09/17 and is improving.  She is currently continued on Meropenem and the sensitivities of the E. Coli pyelonephritis showed no good options to be changed to po. She will be continued on meropenem for now and have a PICC line placed and then will change to Ertapenem for the total duration of 2-week course.  Social work was consulted about discharge planning and it is unlikley that the patient can go back to her ALF with a PICC line so we will attempt to find a SNF for her.  Assessment & Plan:   Principal Problem:   Urinary tract infection due to extended-spectrum beta lactamase (ESBL) producing Escherichia coli Active Problems:   Pyelonephritis   COPD (chronic obstructive pulmonary disease) (HCC)   Renal abscess   Respiratory failure with hypercapnia (HCC)  Acute Hypercarbic Respiratory Failure requiring Mechanical Ventilation, improved  -Status post extubation. Now on  ROOM AIR -Tinea with DuoNeb breathing treatments 3 mL's nebulization every 6 hours as needed for wheezing and shortness of breath -Continue with pulmonary toilet and continue flutter valve and incentive spirometry  Sepsis 2/2 to Left sided Pyelonephritis Pan-Resistant E Coli status post cystoscopy left letter great pyelogram and left ureteral catheterization with aspiration of 350 mL's of pus for culture and left ureteral stent placement -Given bolus of 500 mL's on 09/07/17 and continued on maintenance IV fluids at 50 mL's per hour and will now stop IVF -Repeat Blood cultures on 09/07/17 x2 showed no growth at 5 days and repeat Blood Cx from 09/09/17 Negative so far -Urinalysis showed moderate hemoglobin, trace leukocytes, rare bacteria, 11-20 WBCs -Urine culture from Cystoscope showed greater than 100,000 colonies of E. Coli with sensitivities showing that she is only sensitive to gentamicin, imipenem, Nitrofurantoin, and Zosyn; because there is no good options to change to p.o. we will continue theMeropenem for now and have a PICC line placed and then changed to Ertapenem at discharge for once a day dosing -Continue with Empiric Meropenem at this time while hospitalized and change to Ertapenem at D/C; OPAT orders placed  -PICC LINE Placed 09/11/17 -Underwent cystoscopy left retrograde pyelogram and left ureteral catheterization with aspiration of 350 cc of pus was sent for culture and patient also underwent a left ureteral JJ stent placement by Dr. Gloriann Loan on 09/07/17  -Pain control with Oxycodone-Acetaminophen 1 tablet p.o. every 4 hours as needed as well as Fentanyl 25 mcg IV every 1 hour as needed for breakthrough pain -Discontinued Foley Catheter  -Follow Cx's  -WBC is 3.5 -  Social work notified for assistance in placement; Discussed with Tammy Sours, LCSW feels that the patient cannot go back to her ALF with a PICC line so she is starting the process of finding a SNF bed for this patient   AKI,  improved -Mild on admission.  Patient's BUN and creatinine went from 23/1.03 -> 19/0.75 -> 23/1.00 -> 16/0.72 -> 14/0.83 -IVF Hydration with normal saline at 50 mL/s now stopped -Discontinued Foley Catheter  -Avoid Nephrotoxic medications if possible -Continue to monitor and repeat CMP in a.m.  Hyperkalemia -Improved.  Patient's Potassium went from 5.2 is now 4.4 -Continue to monitor and repeat CMP in a.m.  HLD -Continue with Atorvastatin 40 mg p.o. nightly  Hypertension -Blood pressures were soft recently but have now improved so Held Carvedilol 3.125 mg twice daily resumed to prevent Reflex Tachycardia  -Blood pressure is now 142/82  Hypothyroidism -Continue with Levothyroxine 125 mcg p.o. before breakfast  Normocytic Anemia -Hb/Hct went from 9.9/29.0 and is now stable at 9.6/30.4 -Possibly Dilutional drop from IV fluid hydration -Continue to monitor for signs and symptoms of bleeding -Repeat CBC in a.m.  Bipolar Disorder/Anxiety/Depression -Continue with Divalproex 250 mg p.o. 3 times daily -Continue with Escitalopram 10 mg p.o. every morning -Continue with Clonazepam 2.5 mg twice daily  Acute Encephalopathy -Improved -Was likely fr. Continue to monitor closely  ? Gram + Methicillin Resistant Coag Negative Staph Species Bacteremia, Ruled Out -Suspected Contamination -Repeat blood cultures for verification and they negative so far -IV Vancomycin has been discontinued; C/w Meropenem as above   Essential Tremors -C/w Primidone 50 mill grams nightly  DVT prophylaxis: Enoxaparin 40 mg sq q24h Code Status: FULL CODE Family Communication: No family present at bedside  Disposition Plan: SNF with PICC line and Ertapenem OPAT orders   Consultants:   PCCM Transfer  Urology  Procedures: CYSTOSCOPY WITH RETROGRADE PYELOGRAM/URETERAL STENT PLACEMENT POD 4   Antimicrobials:  Anti-infectives (From admission, onward)   Start     Dose/Rate Route Frequency Ordered Stop    09/08/17 2000  vancomycin (VANCOCIN) IVPB 1000 mg/200 mL premix  Status:  Discontinued     1,000 mg 200 mL/hr over 60 Minutes Intravenous Every 24 hours 09/08/17 1005 09/08/17 1450   09/08/17 0600  meropenem (MERREM) 1 g in sodium chloride 0.9 % 100 mL IVPB     1 g 200 mL/hr over 30 Minutes Intravenous Every 8 hours 09/08/17 0428     09/07/17 2245  meropenem (MERREM) 1 g in sodium chloride 0.9 % 100 mL IVPB     1 g 200 mL/hr over 30 Minutes Intravenous  Once 09/07/17 2232 09/08/17 0722   09/07/17 1900  levofloxacin (LEVAQUIN) IVPB 750 mg     750 mg 100 mL/hr over 90 Minutes Intravenous  Once 09/07/17 1857 09/07/17 2107   09/07/17 1900  aztreonam (AZACTAM) 2 g in sodium chloride 0.9 % 100 mL IVPB     2 g 200 mL/hr over 30 Minutes Intravenous  Once 09/07/17 1857 09/07/17 2007   09/07/17 1900  vancomycin (VANCOCIN) IVPB 1000 mg/200 mL premix     1,000 mg 200 mL/hr over 60 Minutes Intravenous  Once 09/07/17 1857 09/07/17 2115     Subjective: Seen and examined at bedside felt okay.  Had no complaints.  Denied any chest pain, shortness breath, nausea, vomiting.  Was concerned about discharge planning and wanting to know when she could be D/C'd. After sensitivities resulted I discussed this with her and discussed PICC line and she is agreeable.  Objective: Vitals:   09/11/17 1415 09/11/17 1416 09/11/17 2052 09/12/17 0626  BP:  131/73 (!) 149/81 (!) 142/82  Pulse:  69 62 60  Resp:  16 20 (!) 24  Temp:  99.6 F (37.6 C) 99.5 F (37.5 C) 98.9 F (37.2 C)  TempSrc:  Oral Oral Oral  SpO2:  95% 92% 91%  Weight: 88.2 kg (194 lb 6 oz)     Height: 5\' 3"  (1.6 m)       Intake/Output Summary (Last 24 hours) at 09/12/2017 1236 Last data filed at 09/12/2017 0902 Gross per 24 hour  Intake 1080 ml  Output 1500 ml  Net -420 ml   Filed Weights   09/07/17 1912 09/11/17 0600 09/11/17 1415  Weight: 87.5 kg (193 lb) 88 kg (194 lb 0.1 oz) 88.2 kg (194 lb 6 oz)   Examination: Physical  Exam:  Constitutional: Well-nourished well-developed Caucasian female who is currently in no acute distress sitting in chair bedside appears calm and comfortable Eyes: Sclera are anicteric.  Lids and conjunctive are normal ENMT: External ears and nose appear normal.  Mucous membranes are moist Neck: Supple no JVD Cardiovascular: Diminished to auscultation with no appreciable wheezing, rales, rhonchi.  Unlabored breathing and no accessory muscle usage.  She is not wearing any supplemental oxygen Abdomen: Soft, nontender, distended secondary to body habitus.  Bowel sounds are present regular rate and rhythm.  No appreciable murmurs, rubs, gallops.  Mild lower extremity edema GU: Deferred Musculoskeletal: No contractures or cyanosis noted.  Has no joint deformities Skin: Warm and dry with no appreciable rashes or lesions on limited skin evaluation  Neurologic: Cranial nerves II through XII grossly intact no appreciable focal deficits.  Not as tremulous today Psychiatric: Normal mood and pleasant affect.  Intact judgment and insight.  Patient is awake alert and oriented x3  Data Reviewed: I have personally reviewed following labs and imaging studies  CBC: Recent Labs  Lab 09/08/17 0344 09/09/17 1020 09/10/17 0323 09/11/17 0348 09/12/17 0549  WBC 4.2 4.0 4.0 3.5* 4.0  NEUTROABS 3.3 2.1 1.8 1.4* 1.7  HGB 8.2* 8.2* 8.3* 8.7* 9.6*  HCT 25.4* 26.7* 26.8* 27.4* 30.4*  MCV 93.7 95.4 95.4 92.3 93.0  PLT 215 197 212 211 673   Basic Metabolic Panel: Recent Labs  Lab 09/08/17 0344 09/09/17 1020 09/10/17 0323 09/11/17 0348 09/12/17 0549  NA 138 143 143 143 143  K 5.2* 4.6 4.6 4.0 4.4  CL 105 109 106 106 104  CO2 23 26 26 26 29   GLUCOSE 235* 179* 159* 158* 158*  BUN 20 19 23* 16 14  CREATININE 0.77  0.75 0.75 1.00 0.72 0.83  CALCIUM 7.9* 8.7* 8.4* 8.6* 8.9  MG  --  1.9 1.8 1.8 1.9  PHOS  --  3.0 4.2 3.9 4.2   GFR: Estimated Creatinine Clearance: 70.2 mL/min (by C-G formula based on  SCr of 0.83 mg/dL). Liver Function Tests: Recent Labs  Lab 09/08/17 0344 09/09/17 1020 09/10/17 0323 09/11/17 0348 09/12/17 0549  AST 14* 19 16 13* 20  ALT 18 19 18 17 19   ALKPHOS 44 43 42 47 47  BILITOT 0.8 0.4 0.4 0.6 0.7  PROT 6.2* 6.0* 5.8* 6.2* 6.5  ALBUMIN 2.3* 2.3* 2.3* 2.6* 2.7*   No results for input(s): LIPASE, AMYLASE in the last 168 hours. No results for input(s): AMMONIA in the last 168 hours. Coagulation Profile: No results for input(s): INR, PROTIME in the last 168 hours. Cardiac Enzymes: Recent Labs  Lab 09/07/17 1925  TROPONINI <0.03   BNP (last 3 results) No results for input(s): PROBNP in the last 8760 hours. HbA1C: No results for input(s): HGBA1C in the last 72 hours. CBG: Recent Labs  Lab 09/08/17 0321 09/08/17 1953  GLUCAP 221* 240*   Lipid Profile: No results for input(s): CHOL, HDL, LDLCALC, TRIG, CHOLHDL, LDLDIRECT in the last 72 hours. Thyroid Function Tests: No results for input(s): TSH, T4TOTAL, FREET4, T3FREE, THYROIDAB in the last 72 hours. Anemia Panel: No results for input(s): VITAMINB12, FOLATE, FERRITIN, TIBC, IRON, RETICCTPCT in the last 72 hours. Sepsis Labs: Recent Labs  Lab 09/07/17 1913  LATICACIDVEN 1.00    Recent Results (from the past 240 hour(s))  Culture, blood (routine x 2)     Status: None   Collection Time: 09/07/17 12:26 AM  Result Value Ref Range Status   Specimen Description LEFT ANTECUBITAL DRAWN BY RN  Final   Special Requests   Final    BOTTLES DRAWN AEROBIC AND ANAEROBIC Blood Culture adequate volume   Culture   Final    NO GROWTH 5 DAYS Performed at [facility], [address] [place], [place] [address]    Report Status 09/12/2017 FINAL  Final  Culture, blood (routine x 2)     Status: Abnormal   Collection Time: 09/07/17 12:30 AM  Result Value Ref Range Status   Specimen Description   Final    BLOOD RIGHT ANTECUBITAL Performed at [facility], [place] [address], [place], [place] [address]     Special Requests   Final    BOTTLES DRAWN AEROBIC AND ANAEROBIC Blood Culture adequate volume Performed at [place] [place], [place] [address]    Culture  Setup Time   Final    GRAM POSITIVE COCCI AEROBIC BOTTLE ONLY Gram Stain Report Called to,Read Back By and Verified With: H.FRASER AT [facility] ON [id number] AT 0954A BY THOMPSON S. CRITICAL RESULT CALLED TO, READ BACK BY AND VERIFIED WITH: PHARMD D ZEIGLER [phone] CM    Culture (A)  Final    STAPHYLOCOCCUS SPECIES (COAGULASE NEGATIVE) THE SIGNIFICANCE OF ISOLATING THIS ORGANISM FROM A SINGLE SET OF BLOOD CULTURES WHEN MULTIPLE SETS ARE DRAWN IS UNCERTAIN. PLEASE NOTIFY THE MICROBIOLOGY DEPARTMENT WITHIN ONE WEEK IF SPECIATION AND SENSITIVITIES ARE REQUIRED. Performed at [facility], [place] [address], [place], [place] [address]    Report Status 09/10/2017 FINAL  Final  Blood Culture ID Panel (Reflexed)     Status: Abnormal   Collection Time: 09/07/17 12:30 AM  Result Value Ref Range Status   Enterococcus species NOT DETECTED NOT DETECTED Final   Listeria monocytogenes NOT DETECTED NOT DETECTED Final   Staphylococcus species DETECTED (A) NOT DETECTED Final    Comment: Methicillin (oxacillin) resistant coagulase negative staphylococcus. Possible blood culture contaminant (unless isolated from more than one blood culture draw or clinical case suggests pathogenicity). No antibiotic treatment is indicated for blood  culture contaminants. CRITICAL RESULT CALLED TO, READ BACK BY AND VERIFIED WITH: PHARMD D ZEIGLER [phone] MLM    Staphylococcus aureus NOT DETECTED NOT DETECTED Final   Methicillin resistance DETECTED (A) NOT DETECTED Final    Comment: CRITICAL RESULT CALLED TO, READ BACK BY AND VERIFIED WITH: PHARMD D ZEIGLER [phone] CM    Streptococcus species NOT DETECTED NOT DETECTED Final   Streptococcus agalactiae NOT DETECTED NOT DETECTED Final   Streptococcus pneumoniae NOT DETECTED NOT DETECTED Final    Streptococcus pyogenes NOT DETECTED NOT DETECTED Final   Acinetobacter baumannii NOT  DETECTED NOT DETECTED Final   Enterobacteriaceae species NOT DETECTED NOT DETECTED Final   Enterobacter cloacae complex NOT DETECTED NOT DETECTED Final   Escherichia coli NOT DETECTED NOT DETECTED Final   Klebsiella oxytoca NOT DETECTED NOT DETECTED Final   Klebsiella pneumoniae NOT DETECTED NOT DETECTED Final   Proteus species NOT DETECTED NOT DETECTED Final   Serratia marcescens NOT DETECTED NOT DETECTED Final   Haemophilus influenzae NOT DETECTED NOT DETECTED Final   Neisseria meningitidis NOT DETECTED NOT DETECTED Final   Pseudomonas aeruginosa NOT DETECTED NOT DETECTED Final   Candida albicans NOT DETECTED NOT DETECTED Final   Candida glabrata NOT DETECTED NOT DETECTED Final   Candida krusei NOT DETECTED NOT DETECTED Final   Candida parapsilosis NOT DETECTED NOT DETECTED Final   Candida tropicalis NOT DETECTED NOT DETECTED Final    Comment: Performed at [facility], [place] [address], [place], [place] [address]  Urine culture     Status: Abnormal   Collection Time: 09/07/17  6:58 PM  Result Value Ref Range Status   Specimen Description   Final    URINE, RANDOM Performed at [facility], [address], [place], [place] [address]    Special Requests   Final    NONE Performed at [facility], [address], [place], [place] [address]    Culture (A)  Final    <10,000 COLONIES/mL INSIGNIFICANT GROWTH Performed at [place] [address] [place], [place] [address]    Report Status 09/09/2017 FINAL  Final  Blood Culture (routine x 2)     Status: None   Collection Time: 09/07/17  7:07 PM  Result Value Ref Range Status   Specimen Description BLOOD RIGHT FOREARM  Final   Special Requests   Final    BOTTLES DRAWN AEROBIC AND ANAEROBIC Blood Culture adequate volume   Culture   Final    NO GROWTH 5 DAYS Performed at [facility], [address], [place], [place] [address]    Report  Status 09/12/2017 FINAL  Final  Blood Culture (routine x 2)     Status: None   Collection Time: 09/07/17  7:11 PM  Result Value Ref Range Status   Specimen Description BLOOD LEFT FOREARM  Final   Special Requests   Final    BOTTLES DRAWN AEROBIC AND ANAEROBIC Blood Culture adequate volume   Culture   Final    NO GROWTH 5 DAYS Performed at [facility], [address], [place], [place] [address]    Report Status 09/12/2017 FINAL  Final  Urine Culture     Status: Abnormal   Collection Time: 09/07/17 11:35 PM  Result Value Ref Range Status   Specimen Description   Final    URINE, CATHETERIZED CYTOSCOPE Performed at [facility], [place] [address] [place], [place] [address]    Special Requests   Final    NONE Performed at [facility], [place] [address], [place], [place] [address]    Culture >=100,000 COLONIES/mL ESCHERICHIA COLI (A)  Final   Report Status 09/11/2017 FINAL  Final   Organism ID, Bacteria ESCHERICHIA COLI (A)  Final      Susceptibility   Escherichia coli - MIC*    AMPICILLIN >=32 RESISTANT Resistant     CEFAZOLIN >=64 RESISTANT Resistant     CEFTRIAXONE >=64 RESISTANT Resistant     CIPROFLOXACIN >=4 RESISTANT Resistant     GENTAMICIN <=1 SENSITIVE Sensitive     IMIPENEM <=0.25 SENSITIVE Sensitive  NITROFURANTOIN <=16 SENSITIVE Sensitive     TRIMETH/SULFA >=320 RESISTANT Resistant     AMPICILLIN/SULBACTAM >=32 RESISTANT Resistant     PIP/TAZO 16 SENSITIVE Sensitive     * >=100,000 COLONIES/mL ESCHERICHIA COLI  MRSA PCR Screening     Status: None   Collection Time: 09/08/17  3:30 AM  Result Value Ref Range Status   MRSA by PCR NEGATIVE NEGATIVE Final    Comment:        The GeneXpert MRSA Assay (FDA approved for NASAL specimens only), is one component of a comprehensive MRSA colonization surveillance program. It is not intended to diagnose MRSA infection nor to guide or monitor treatment for MRSA infections. Performed at  [facility], [place] [address], [place], [place] [address]   Culture, blood (routine x 2)     Status: None (Preliminary result)   Collection Time: 09/09/17  5:43 PM  Result Value Ref Range Status   Specimen Description   Final    BLOOD RIGHT ARM Performed at [place] [address] [place], [place] [address]    Special Requests   Final    BOTTLES DRAWN AEROBIC ONLY Blood Culture adequate volume Performed at [place] [address] [place], [place] [address]    Culture   Final    NO GROWTH 3 DAYS Performed at [facility], [place] [address], [place], [place] [address]    Report Status PENDING  Incomplete  Culture, blood (routine x 2)     Status: None (Preliminary result)   Collection Time: 09/09/17  5:43 PM  Result Value Ref Range Status   Specimen Description   Final    BLOOD RIGHT WRIST Performed at [place] [address] [place], [place] [address]    Special Requests   Final    BOTTLES DRAWN AEROBIC ONLY Blood Culture adequate volume Performed at [place] [address] [place], [place] [address]    Culture   Final    NO GROWTH 3 DAYS Performed at [facility], [place] [address] [place],  [address]    Report Status PENDING  Incomplete    Radiology Studies: [place] Ekg Site Rite  Result Date: 09/11/2017 If Site Rite image not attached, placement could not be confirmed due to current cardiac rhythm.  Scheduled Meds: . aspirin  81 mg Oral q morning - 10a  . atorvastatin  40 mg Oral QHS  . carvedilol  3.125 mg Oral BID WC  . divalproex  250 mg Oral TID  . enoxaparin (LOVENOX) injection  40 mg Subcutaneous Q24H  . escitalopram  10 mg Oral q morning - 10a  . levothyroxine  125 mcg Oral QAC breakfast  . loratadine  10 mg Oral q morning - 10a  . multivitamin with minerals  1 tablet Oral q morning - 10a  . polyethylene glycol  17 g Oral BID  .  primidone  50 mg Oral QHS  . senna-docusate  1 tablet Oral BID  . sodium chloride flush  10-40 mL Intracatheter Q12H   Continuous Infusions: . famotidine (PEPCID) IV Stopped (09/12/17 1140)  . meropenem (MERREM) IV Stopped (09/12/17 2440)  . sodium chloride      LOS: 4 days   Kerney Elbe, DO [facility] Pager [phone]  If 7PM-7AM, please contact night-coverage [email or web address] Password [facility] 09/12/2017, 12:36 PM

## 2017-09-12 NOTE — Clinical Social Work Note (Signed)
Clinical Social Work Assessment  Patient Details  Name: Laurie Moreno MRN: [id number] Date of Birth: 14-Jun-1950  Date of referral:  09/12/17               Reason for consult:  Facility Placement                Permission sought to share information with:  Facility Sport and exercise psychologist, Family Supports Permission granted to share information::  Yes, Verbal Permission Granted  Name::     Nurse, learning disability::  SNF  Relationship::  sister  Contact Information:     Housing/Transportation Living arrangements for the past 2 months:  [place], [place] of Information:  Patient Patient Interpreter Needed:  None Criminal Activity/Legal Involvement Pertinent to Current Situation/Hospitalization:  No - Comment as needed Significant Relationships:  Adult Children, Siblings Lives with:  Facility Resident Do you feel safe going back to the place where you live?  Yes Need for family participation in patient care:  No (Coment)  Care giving concerns:  Pt normally at ALF but they cannot manage IV antibiotics.   Social Worker assessment / plan:  Pt was at SNF prior to admission for IV antibiotics and will need to continue IV at time of DC- was at [facility].  Employment status:  Retired Nurse, adult PT Recommendations:  Home with [place] / Referral to community resources:  [place]  Patient/Family's Response to care:  Pt agreeable to return to SNF- hopeful [facility] can take her back.  Patient/Family's Understanding of and Emotional Response to Diagnosis, Current Treatment, and Prognosis:  Pt and family has good understanding of current condition but hopeful she can return to ALF soon.  Emotional Assessment Appearance:  Appears stated age Attitude/Demeanor/Rapport:    Affect (typically observed):  Appropriate, Pleasant Orientation:  Oriented to Self, Oriented to Place, Oriented to  Time, Oriented to  Situation Alcohol / Substance use:  Not Applicable Psych involvement (Current and /or in the community):  No (Comment)  Discharge Needs  Concerns to be addressed:  Care Coordination Readmission within the last 30 days:  Yes Current discharge risk:  Physical Impairment Barriers to Discharge:  Continued Medical Work up   Jorge Ny, LCSW 09/12/2017, 3:24 PM

## 2017-09-12 NOTE — Progress Notes (Signed)
PHARMACY CONSULT NOTE FOR:  OUTPATIENT  PARENTERAL ANTIBIOTIC THERAPY (OPAT)  Indication: E. Coli pyelonephritis Regimen: Ertapenem 1g IV q24h  End date: 2 week course (including inpatient meropenem), treat through 09/21/17   IV antibiotic discharge orders are pended. To discharging provider:  please sign these orders via discharge navigator,  Select New Orders & click on the button choice - Manage This Unsigned Work.     Thank you for allowing pharmacy to be a part of this patient's care.  Gretta Arab PharmD, BCPS Pager [phone] 09/12/2017 12:51 PM

## 2017-09-12 NOTE — NC FL2 (Addendum)
[place] LEVEL OF CARE SCREENING TOOL     IDENTIFICATION  Patient Name: Laurie Moreno Birthdate: [birth year] Sex: female Admission Date (Current Location): 09/07/2017  [facility] and [place] Number:  [employer] systems and Address:  [facility],  [address] [place], [place]      Provider Number: [id number]  Attending Physician Name and Address:  Kerney Elbe, DO  Relative Name and Phone Number:       Current Level of Care: Hospital Recommended Level of Care: [place] Prior Approval Number:    Date Approved/Denied:   PASRR Number: [id number] E expires 09/23/17  Discharge Plan: SNF    Current Diagnoses: Patient Active Problem List   Diagnosis Date Noted  . Renal abscess 09/08/2017  . Respiratory failure with hypercapnia ([place])   . Low grade fever 09/04/2017  . Urinary tract infection due to extended-spectrum beta lactamase (ESBL) producing Escherichia coli 08/25/2017  . Pyelonephritis 08/21/2017  . COPD (chronic obstructive pulmonary disease) ([place]) 08/21/2017  . Thrombocytopenia ([place]) 08/21/2017  . Ureteropelvic junction (UPJ) obstruction, left   . HCAP (healthcare-associated pneumonia) 08/07/2017  . Bipolar 1 disorder ([place]) 08/07/2017  . Essential tremor 08/07/2017  . Acute on chronic respiratory failure with hypoxia ([place]) 08/07/2017  . Altered mental status 08/06/2017    Orientation RESPIRATION BLADDER Height & Weight     Self, Time, Situation, Place  Normal Incontinent, External catheter Weight: 194 lb 6 oz (88.2 kg) Height:  5\' 3"  (160 cm)  BEHAVIORAL SYMPTOMS/MOOD NEUROLOGICAL BOWEL NUTRITION STATUS      Continent Diet  AMBULATORY STATUS COMMUNICATION OF NEEDS Skin   Limited Assist Verbally Normal                       Personal Care Assistance Level of Assistance  Bathing, Dressing Bathing Assistance: Limited assistance   Dressing Assistance: Limited assistance     Functional Limitations Info              SPECIAL CARE FACTORS FREQUENCY  OT (By licensed OT), PT (By licensed PT)     PT Frequency: 5/wk OT Frequency: 5/wk            Contractures      Additional Factors Info  Code Status, Allergies Code Status Info: FULL Allergies Info: Penicillins           Current Medications (09/12/2017):  This is the current hospital active medication list Current Facility-Administered Medications  Medication Dose Route Frequency Provider Last Rate Last Dose  . acetaminophen (TYLENOL) tablet 650 mg  650 mg Oral Q6H PRN Gala Romney L, MD      . aspirin chewable tablet 81 mg  81 mg Oral q morning - 10a Jonelle Sidle, Mohammad L, MD   81 mg at 09/12/17 1138  . atorvastatin (LIPITOR) tablet 40 mg  40 mg Oral QHS Elwyn Reach, MD   40 mg at 09/11/17 2131  . bisacodyl (DULCOLAX) suppository 10 mg  10 mg Rectal Daily PRN Raiford Noble Latif, DO      . carvedilol (COREG) tablet 3.125 mg  3.125 mg Oral BID WC Sheikh, Georgina Quint [place], DO   3.125 mg at 09/12/17 0859  . divalproex (DEPAKOTE) DR tablet 250 mg  250 mg Oral TID Elwyn Reach, MD   250 mg at 09/12/17 1138  . enoxaparin (LOVENOX) injection 40 mg  40 mg Subcutaneous Q24H Gala Romney L, MD   40 mg at 09/12/17 1139  . escitalopram (LEXAPRO)  tablet 10 mg  10 mg Oral q morning - 10a Elwyn Reach, MD   10 mg at 09/12/17 1138  . famotidine (PEPCID) IVPB 20 mg premix  20 mg Intravenous Q12H Anders Simmonds, MD   Stopped at 09/12/17 1140  . fentaNYL (SUBLIMAZE) bolus via infusion 25 mcg  25 mcg Intravenous Q1H PRN Corey Harold, NP      . ipratropium-albuterol (DUONEB) 0.5-2.5 (3) MG/3ML nebulizer solution 3 mL  3 mL Nebulization Q6H PRN Gala Romney L, MD      . levothyroxine (SYNTHROID, LEVOTHROID) tablet 125 mcg  125 mcg Oral QAC breakfast Elwyn Reach, MD   125 mcg at 09/12/17 0859  . loratadine (CLARITIN) tablet 10 mg  10 mg Oral q morning - 10a Elwyn Reach, MD   10 mg at 09/12/17 1138  . meropenem (MERREM)  1 g in sodium chloride 0.9 % 100 mL IVPB  1 g Intravenous Q8H Garba, Mohammad L, MD 200 mL/hr at 09/12/17 1402 1 g at 09/12/17 1402  . multivitamin with minerals tablet 1 tablet  1 tablet Oral q morning - 10a Elwyn Reach, MD   1 tablet at 09/12/17 1138  . ondansetron (ZOFRAN) injection 4 mg  4 mg Intravenous Q1H PRN Francine Graven, DO   4 mg at 09/07/17 1944  . oxyCODONE-acetaminophen (PERCOCET/ROXICET) 5-325 MG per tablet 1 tablet  1 tablet Oral Q4H PRN Frederik Pear, MD   1 tablet at 09/11/17 0653  . polyethylene glycol (MIRALAX / GLYCOLAX) packet 17 g  17 g Oral BID Raiford Noble [place], DO   17 g at 09/11/17 2131  . primidone (MYSOLINE) tablet 50 mg  50 mg Oral QHS Elwyn Reach, MD   50 mg at 09/11/17 2130  . senna-docusate (Senokot-S) tablet 1 tablet  1 tablet Oral BID Raiford Noble [place], DO   1 tablet at 09/12/17 1138  . sodium chloride 0.9 % bolus 500 mL  500 mL Intravenous Once Francine Graven, DO      . sodium chloride flush (NS) 0.9 % injection 10-40 mL  10-40 mL Intracatheter Q12H Sheikh, Omair Latif, DO   10 mL at 09/12/17 1140  . sodium chloride flush (NS) 0.9 % injection 10-40 mL  10-40 mL Intracatheter PRN Kerney Elbe, DO         Discharge Medications: Please see discharge summary for a list of discharge medications.  Relevant Imaging Results:  Relevant Lab Results:   Additional Information SSN: [id number]; needs IV antibiotics through 09/21/17  Jorge Ny, LCSW

## 2017-09-13 ENCOUNTER — Inpatient Hospital Stay
Admission: RE | Admit: 2017-09-13 | Discharge: 2017-09-22 | Disposition: A | Discharge status: Skilled Nursing Facility | Payer: Medicare Other | Source: Ambulatory Visit | Attending: Internal Medicine | Admitting: Internal Medicine

## 2017-09-13 DIAGNOSIS — B9620 Unspecified Escherichia coli [E. coli] as the cause of diseases classified elsewhere: Secondary | ICD-10-CM

## 2017-09-13 DIAGNOSIS — Q6239 Other obstructive defects of renal pelvis and ureter: Principal | ICD-10-CM

## 2017-09-13 LAB — CBC WITH DIFFERENTIAL/PLATELET
Basophils Absolute: 0 10*3/uL (ref 0.0–0.1)
Basophils Relative: 0 %
Eosinophils Absolute: 0.1 10*3/uL (ref 0.0–0.7)
Eosinophils Relative: 2 %
HCT: 30 % — ABNORMAL LOW (ref 36.0–46.0)
HEMOGLOBIN: 9.6 g/dL — AB (ref 12.0–15.0)
LYMPHS ABS: 2 10*3/uL (ref 0.7–4.0)
LYMPHS PCT: 45 %
MCH: 29.5 pg (ref 26.0–34.0)
MCHC: 32 g/dL (ref 30.0–36.0)
MCV: 92.3 fL (ref 78.0–100.0)
Monocytes Absolute: 0.6 10*3/uL (ref 0.1–1.0)
Monocytes Relative: 12 %
NEUTROS ABS: 1.8 10*3/uL (ref 1.7–7.7)
NEUTROS PCT: 41 %
Platelets: 196 10*3/uL (ref 150–400)
RBC: 3.25 MIL/uL — AB (ref 3.87–5.11)
RDW: 13.6 % (ref 11.5–15.5)
WBC: 4.5 10*3/uL (ref 4.0–10.5)

## 2017-09-13 LAB — COMPREHENSIVE METABOLIC PANEL
ALK PHOS: 48 U/L (ref 38–126)
ALT: 23 U/L (ref 14–54)
AST: 24 U/L (ref 15–41)
Albumin: 2.9 g/dL — ABNORMAL LOW (ref 3.5–5.0)
Anion gap: 11 (ref 5–15)
BUN: 16 mg/dL (ref 6–20)
CALCIUM: 8.8 mg/dL — AB (ref 8.9–10.3)
CO2: 25 mmol/L (ref 22–32)
CREATININE: 0.78 mg/dL (ref 0.44–1.00)
Chloride: 105 mmol/L (ref 101–111)
Glucose, Bld: 140 mg/dL — ABNORMAL HIGH (ref 65–99)
Potassium: 4.2 mmol/L (ref 3.5–5.1)
Sodium: 141 mmol/L (ref 135–145)
Total Bilirubin: 0.8 mg/dL (ref 0.3–1.2)
Total Protein: 6.4 g/dL — ABNORMAL LOW (ref 6.5–8.1)

## 2017-09-13 LAB — PHOSPHORUS: PHOSPHORUS: 4 mg/dL (ref 2.5–4.6)

## 2017-09-13 LAB — MAGNESIUM: MAGNESIUM: 2 mg/dL (ref 1.7–2.4)

## 2017-09-13 MED ORDER — POLYETHYLENE GLYCOL 3350 17 G PO PACK: 17.0000 g | PACK | Freq: Every day | ORAL | 0 refills | Status: DC | PRN

## 2017-09-13 MED ORDER — CLONAZEPAM 0.5 MG PO TABS: 0.5000 mg | ORAL_TABLET | Freq: Two times a day (BID) | ORAL | 0 refills | Status: DC

## 2017-09-13 MED ORDER — OXYCODONE-ACETAMINOPHEN 5-325 MG PO TABS: 1.0000 | ORAL_TABLET | ORAL | 0 refills | Status: DC | PRN

## 2017-09-13 MED ORDER — BISACODYL 10 MG RE SUPP: 10.0000 mg | Freq: Every day | RECTAL | 0 refills | Status: DC | PRN

## 2017-09-13 MED ORDER — ERTAPENEM IV (FOR PTA / DISCHARGE USE ONLY): 1.0000 g | INTRAVENOUS | 0 refills | Status: AC

## 2017-09-13 MED ORDER — SENNOSIDES-DOCUSATE SODIUM 8.6-50 MG PO TABS: 1.0000 | ORAL_TABLET | Freq: Every day | ORAL | Status: DC

## 2017-09-13 NOTE — Discharge Summary (Signed)
Physician Discharge Summary  Laurie Moreno KXF:[id number] DOB: 1950/09/15 DOA: 09/07/2017  PCP: Salvadore Dom, MD  Admit date: 09/07/2017 Discharge date: 09/13/2017  Admitted From: ALF Disposition: SNF (Cannot have PICC Line at [facility])  Recommendations for Outpatient Follow-up:  1. Follow up with PCP in 1-2 weeks 2. Follow up with Urology in 1-2 weeks  3. Please obtain CMP/CBC, Mag, Phos in one week and repeat CBC awith Differential Weekly 4. Repeat ESR and CRP every other week 5. Please follow up on the following pending results:  Home Health: YES recommended PT Equipment/Devices: None recommended by PT  Discharge Condition: Stable  CODE STATUS: FULL CODE  Diet recommendation: Heart Healthy Diet  Brief/Interim Summary: The patient is a 67 year old female with PMH which is significant for Bipolar disorder, Nephrolithiasis, COPD, andHypothyroidism and other comorbids. She was recently admitted for ESBL E. coli pyelonephritis and treated with 7 days of Meropenem and discharged to SNF. Hydronephrosis was seen on CT, but was chronic and Urology recommended outpatient follow up. She was scheduled to have L ureteral stent 5/8. She then presented again to ED 5/7 with complaints of fever/chills. She was transferred to [facility] for Urology evaluation. She had fever to 103F. She was seen by Urology who took her to the OR for Cystoscopy with leftretrograde pyelogram,left ureteral catheterization with aspiration of 350 cc of pus for culture,and leftureteral stent placement. Post operatively she remained on vent and was transferred to ICU for further care and was extubated on 09/08/17. She was transferred to [facility] 09/09/17 and improved. She is currently continued on Meropenem and the sensitivities of the E. Coli pyelonephritis showed no good options to be changed to po. She was continued on Meropenem for while hospitalized and have a PICC line placed and then will change to Ertapenem for the total  duration of 2-week course.  Social work was consulted about discharge planning and it is unlikley that the patient can go back to her ALF with a PICC line so she will be D/C'd to SNF prior to going back to her ALF. Patient was deemed medically stable to D/C at this time and will need to follow up with PCP and Urology as an outpatient.   Discharge Diagnoses:  Principal Problem:   Urinary tract infection due to extended-spectrum beta lactamase (ESBL) producing Escherichia coli Active Problems:   Pyelonephritis   COPD (chronic obstructive pulmonary disease) (HCC)   Renal abscess   Respiratory failure with hypercapnia (HCC)  Acute Hypercarbic Respiratory Failure requiring Mechanical Ventilation, improved  -Status post extubation. Now on ROOM AIR -Tinea with DuoNeb breathing treatments 3 mL's nebulization every 6 hours as needed for wheezing and shortness of breath -Continue with pulmonary toilet and continue flutter valve and incentive spirometry  Sepsis 2/2 to Left sided Pyelonephritis Pan-Resistant E Coli status post cystoscopy left letter great pyelogram and left ureteral catheterization with aspiration of 350 mL's of pus for culture and left ureteral stent placement -Given bolus of 500 mL's on 09/07/17 and continued on maintenance IV fluids at 50 mL's per hour and will now stop IVF -Repeat Blood cultures on 09/07/17 x2 showed no growth at 5 days and repeat Blood Cx from 09/09/17 Negative so far at 3 Days -Urinalysis showed moderate hemoglobin, trace leukocytes, rare bacteria, 11-20 WBCs -Urine culture from Cystoscope showed greater than 100,000 colonies of E. Coli with sensitivities showing that she is only sensitive to gentamicin, imipenem, Nitrofurantoin, and Zosyn; because there is no good options to change to  p.o. we will continue theMeropenem for now and have a PICC line placed and then changed to Ertapenem at discharge for once a day dosing -Continue with Empiric Meropenem at this time while  hospitalized and change to Ertapenem at D/C; OPAT orders placed and last day of therapy is 09/21/17 -PICC LINE Placed 09/11/17 -Underwent Cystoscopy left retrograde pyelogram and left ureteral catheterization with aspiration of 350 cc of pus was sent for culture and patient also underwent a left ureteral JJ stent placement by Dr. Gloriann Loan on 09/07/17  -Pain control with Oxycodone-Acetaminophen 1 tablet p.o. every 4 hours as needed at D/C. Given IV Fentanyl 25 mcg IV every 1 hour as needed for breakthrough pain -Discontinued Foley Catheter  -Follow Cx's  -WBC is 4.5 -Social work notified for assistance in placement and Patient to be D/C'd to [facility] in [place] prior to being D/C'd back to [facility]  AKI, improved -Mild on admission.  Patient's BUN and creatinine stable at 16/0.78 -Discontinued Foley Catheter  -Avoid Nephrotoxic medications if possible -Continue to monitor and repeat CMP at SNF  Hyperkalemia -Improved.  Patient's Potassium went from 5.2 is now 4.2 -Continue to monitor and repeat CMP at SNF  HLD -Continue with Atorvastatin 40 mg p.o. nightly  Hypertension -Blood pressures were soft recently but have now improved so Held Carvedilol 3.125 mg twice daily resumed to prevent Reflex Tachycardia  -Blood pressure is now 151/69  Hypothyroidism -Continue with Levothyroxine 125 mcg p.o. before breakfast  Normocytic Anemia -Hb/Hct went from 9.9/29.0 and is now stable at 9.6/30.0 -Possibly Dilutional drop from IV fluid hydration -Continue to monitor for signs and symptoms of bleeding -Repeat CBC in a.m.  Bipolar Disorder/Anxiety/Depression -Continue with Divalproex 250 mg p.o. 3 times daily -Continue with Escitalopram 10 mg p.o. every morning -Continue with Clonazepam 2.5 mg twice daily  Acute Encephalopathy, improved -Improved -Was likely from Ventilation and Pyleonephritis -Continue to monitor closely  ? Gram + Methicillin Resistant Coag Negative Staph  Species Bacteremia, Ruled Out -Suspected Contamination -Repeat blood cultures for verification and they negative so far -IV Vancomycin has been discontinued; Continued Meropenem as above and will change to Ertapenem   Essential Tremors -C/w Primidone 50 mill grams nightly  Discharge Instructions Discharge Instructions    Call MD for:  difficulty breathing, headache or visual disturbances   Complete by:  As directed    Call MD for:  extreme fatigue   Complete by:  As directed    Call MD for:  hives   Complete by:  As directed    Call MD for:  persistant dizziness or light-headedness   Complete by:  As directed    Call MD for:  persistant nausea and vomiting   Complete by:  As directed    Call MD for:  redness, tenderness, or signs of infection (pain, swelling, redness, odor or green/yellow discharge around incision site)   Complete by:  As directed    Call MD for:  severe uncontrolled pain   Complete by:  As directed    Call MD for:  temperature >100.4   Complete by:  As directed    Diet - low sodium heart healthy   Complete by:  As directed    Discharge instructions   Complete by:  As directed    Follow up with PCP and Urology at D/C. Take all medications as precscirbed. If symptoms change or worsen please return to the ED for evaluation.   Home infusion instructions [facility] May follow  [facility] Dosing Protocol; May administer Cathflo as needed to maintain patency of vascular access device.; Flushing of vascular access device: per [facility] Protocol: 0.9% NaCl pre/post medica...   Complete by:  As directed    Instructions:  May follow [place] Dosing Protocol   Instructions:  May administer Cathflo as needed to maintain patency of vascular access device.   Instructions:  Flushing of vascular access device: per [facility] Protocol: 0.9% NaCl pre/post medication administration and prn patency; Heparin 100 u/ml, 72m for implanted ports and Heparin 10u/ml, 558mfor all other  central venous catheters.   Instructions:  May follow [facility] Anaphylaxis Protocol for First Dose Administration in the home: 0.9% NaCl at 25-50 ml/hr to maintain IV access for protocol meds. Epinephrine 0.3 ml IV/IM PRN and Benadryl 25-50 IV/IM PRN s/s of anaphylaxis.   Instructions:  [facility] Coordinator (RN) to assist per patient IV care needs in the home PRN.   Increase activity slowly   Complete by:  As directed      Allergies as of 09/13/2017      Reactions   Penicillins Hives, Rash, Other (See Comments)   Not effective per patient. Pt does not know. She states it was when she was a child. Unknown      Medication List    TAKE these medications   acetaminophen 325 MG tablet Commonly known as:  TYLENOL Take 650 mg by mouth every 6 (six) hours as needed (for temperatures greater than 101. Obtain blood cultures twice for any temperature greater than 101 and notify provider, [facility]   albuterol 108 (90 Base) MCG/ACT inhaler Commonly known as:  PROVENTIL HFA;VENTOLIN HFA Inhale 1 puff into the lungs every 6 (six) hours as needed for wheezing or shortness of breath.   aspirin 81 MG chewable tablet Chew 81 mg by mouth every morning.   atorvastatin 40 MG tablet Commonly known as:  LIPITOR Take 40 mg by mouth at bedtime.   bisacodyl 10 MG suppository Commonly known as:  DULCOLAX Place 1 suppository (10 mg total) rectally daily as needed for moderate constipation.   carvedilol 3.125 MG tablet Commonly known as:  COREG Take 3.125 mg by mouth 2 (two) times daily with a meal.   clonazePAM 0.5 MG tablet Commonly known as:  KLONOPIN Take 1 tablet (0.5 mg total) by mouth 2 (two) times daily.   divalproex 250 MG DR tablet Commonly known as:  DEPAKOTE Take 250 mg by mouth 3 (three) times daily. Do not crush   docusate sodium 100 MG capsule Commonly known as:  COLACE Take 100 mg by mouth daily as needed for mild constipation.   ertapenem IVPB Commonly known as:   INVANZ Inject 1 g into the vein daily for 8 days. Indication:  E. Coli pyelonephritis Last Day of Therapy:  09/21/17 Labs - Once weekly:  CBC/D and BMP, Labs - Every other week:  ESR and CRP   escitalopram 10 MG tablet Commonly known as:  LEXAPRO Take 10 mg by mouth every morning.   gabapentin 300 MG capsule Commonly known as:  NEURONTIN Take 300 mg by mouth 4 (four) times daily.   ipratropium-albuterol 0.5-2.5 (3) MG/3ML Soln Commonly known as:  DUONEB Take 3 mLs by nebulization every 6 (six) hours as needed (Wheezing/Shortness of Breath).   levothyroxine 125 MCG tablet Commonly known as:  SYNTHROID, LEVOTHROID Take 125 mcg by mouth daily before breakfast.   loratadine 10 MG tablet Commonly known as:  CLARITIN Take 10 mg  by mouth every morning.   multivitamin tablet Take 1 tablet by mouth every morning.   oxyCODONE-acetaminophen 5-325 MG tablet Commonly known as:  PERCOCET/ROXICET Take 1 tablet by mouth every 4 (four) hours as needed for moderate pain.   polyethylene glycol packet Commonly known as:  MIRALAX / GLYCOLAX Take 17 g by mouth daily as needed.   primidone 50 MG tablet Commonly known as:  MYSOLINE Take 50 mg by mouth at bedtime.   senna-docusate 8.6-50 MG tablet Commonly known as:  Senokot-S Take 1 tablet by mouth at bedtime.            Home Infusion Instuctions  (From admission, onward)        Start     Ordered   09/13/17 0000  Home infusion instructions [facility] May follow [facility] Dosing Protocol; May administer Cathflo as needed to maintain patency of vascular access device.; Flushing of vascular access device: per [facility] Protocol: 0.9% NaCl pre/post medica...    Question Answer Comment  Instructions May follow [place] Dosing Protocol   Instructions May administer Cathflo as needed to maintain patency of vascular access device.   Instructions Flushing of vascular access device: per [facility] Protocol: 0.9% NaCl pre/post medication  administration and prn patency; Heparin 100 u/ml, 71m for implanted ports and Heparin 10u/ml, 54mfor all other central venous catheters.   Instructions May follow [facility] Anaphylaxis Protocol for First Dose Administration in the home: 0.9% NaCl at 25-50 ml/hr to maintain IV access for protocol meds. Epinephrine 0.3 ml IV/IM PRN and Benadryl 25-50 IV/IM PRN s/s of anaphylaxis.   Instructions [facility] Infusion Coordinator (RN) to assist per patient IV care needs in the home PRN.      09/13/17 1045     Contact information for after-discharge care    [facility] .   Service:  Skilled Nursing Contact information: [id number] [phone]           Allergies  Allergen Reactions  . Penicillins Hives, Rash and Other (See Comments)    Not effective per patient. Pt does not know. She states it was when she was a child. Unknown   Consultations:  Urology  PCCM Transfer  Procedures/Studies: Dg Chest 2 View  Result Date: 08/21/2017 CLINICAL DATA:  Fever, cough, diarrhea, and abdominal pain. EXAM: CHEST - 2 VIEW COMPARISON:  08/05/2017 FINDINGS: Heart is enlarged. Patient is slightly rotated towards the LEFT. There are no focal consolidations or pleural effusions. No evidence for pulmonary edema. IMPRESSION: Stable cardiomegaly. Electronically Signed   By: ElNolon Nations.D.   On: 08/21/2017 18:59   Ct Abdomen Pelvis W Contrast  Result Date: 08/21/2017 CLINICAL DATA:  Abdominal pain EXAM: CT ABDOMEN AND PELVIS WITH CONTRAST TECHNIQUE: Multidetector CT imaging of the abdomen and pelvis was performed using the standard protocol following bolus administration of intravenous contrast. CONTRAST:  10037mSOVUE-300 IOPAMIDOL (ISOVUE-300) INJECTION 61% COMPARISON:  08/12/2015 FINDINGS: Lower chest: Lung bases are well aerated with stable right lower lobe nodule when compared with the prior exam. Hepatobiliary: Liver  demonstrates some nodularity consistent with mild underlying cirrhosis. No focal mass is seen. Gallbladder is within normal limits. Pancreas: Unremarkable. No pancreatic ductal dilatation or surrounding inflammatory changes. Spleen: Normal in size without focal abnormality. Adrenals/Urinary Tract: Adrenal glands are within normal limits. Kidneys are well visualized bilaterally. The right kidney demonstrates no renal calculi or obstructive change. The left kidney  demonstrates considerable perinephric stranding as well as hydronephrosis without evidence of significant hydroureter. This is consistent with a ureteropelvic junction obstruction and is similar to that seen on the prior examination. The perinephric changes likely represent a component of pyelonephritis. Some mottled enhancement is noted in the upper pole on the left. Some slight increased attenuation in the distal ureter on the left is noted. Again no stone is identified. Stomach/Bowel: The appendix is not well visualized. No obstructive or inflammatory changes are seen. Vascular/Lymphatic: No significant vascular findings are present. No enlarged abdominal or pelvic lymph nodes. Reproductive: Scattered small fibroids are noted. Other: No abdominal wall hernia or abnormality. No abdominopelvic ascites. Musculoskeletal: Degenerative changes of lumbar spine are noted. IMPRESSION: Changes consistent with left UPJ obstruction. Associated inflammatory changes and mottled enhancement in the left kidney are noted suggestive of pyelonephritis. Mild cirrhotic change Uterine fibroids. Electronically Signed   By: Inez Catalina M.D.   On: 08/21/2017 19:08   Dg Chest Port 1 View  Result Date: 09/08/2017 CLINICAL DATA:  67 year old female with endotracheal tube. Subsequent encounter. EXAM: PORTABLE CHEST 1 VIEW COMPARISON:  09/08/2017 1:02 a.m. FINDINGS: Endotracheal tube tip 1.3 cm above the carina. Slight decrease in degree of pulmonary vascular congestion.  Cardiomegaly and prominent mediastinum unchanged. Slightly elevated right hemidiaphragm. No acute osseous abnormality. IMPRESSION: Slight decrease in degree of pulmonary vascular congestion. Cardiomegaly and prominent mediastinum unchanged. Endotracheal tube tip 1.3 cm above the carina. Electronically Signed   By: Genia Del M.D.   On: 09/08/2017 06:56   Dg Chest Port 1 View  Result Date: 09/08/2017 CLINICAL DATA:  Initial evaluation for endotracheal tube placement. Respiratory failure. EXAM: PORTABLE CHEST 1 VIEW COMPARISON:  Prior radiograph from 09/07/2017. FINDINGS: Endotracheal tube in place with tip positioned approximately 11 mm above the carina. Cardiomegaly, stable. Mediastinal silhouette within normal limits. Lungs are hypoinflated. Diffuse vascular congestion with bronchovascular crowding and bibasilar atelectatic changes. No definite new focal infiltrates. No pneumothorax. No acute osseous abnormality. IMPRESSION: 1. Tip of the endotracheal tube positioned 11 mm above the carina. 2. Low lung volumes with secondary bibasilar atelectasis/bronchovascular crowding. 3. Stable cardiomegaly with diffuse pulmonary vascular congestion without overt edema. Electronically Signed   By: Jeannine Boga M.D.   On: 09/08/2017 02:31   Dg Chest Port 1 View  Result Date: 09/07/2017 CLINICAL DATA:  67 year old female with fever for 2 days.  Hypoxia. EXAM: PORTABLE CHEST 1 VIEW COMPARISON:  Portable chest 08/25/2017 and earlier. FINDINGS: Portable AP upright view at 1905 hours. Lower lung volumes and persistent lordotic positioning. Stable mild cardiomegaly. Other mediastinal contours are within normal limits. Visualized tracheal air column is within normal limits. Allowing for portable technique the lungs are clear. Paucity of bowel gas in the upper abdomen. No acute osseous abnormality identified. IMPRESSION: Low lung volumes.  No acute cardiopulmonary abnormality. Electronically Signed   By: Genevie Ann M.D.    On: 09/07/2017 19:38   Dg Chest Port 1 View  Result Date: 08/25/2017 CLINICAL DATA:  67 y/o  F; status post right PICC line placement. EXAM: PORTABLE CHEST 1 VIEW COMPARISON:  08/21/2017 chest radiograph FINDINGS: Right PICC line tip projects over lower SVC. Stable cardiomegaly. No consolidation, effusion, or pneumothorax. Bones are unremarkable. IMPRESSION: Right PICC line tip projects over lower SVC. Electronically Signed   By: Kristine Garbe M.D.   On: 08/25/2017 15:08   Dg C-arm 1-60 Min-no Report  Result Date: 09/07/2017 Fluoroscopy was utilized by the requesting physician.  No radiographic interpretation.   [place]  Ekg Site Rite  Result Date: 09/11/2017 If Site Rite image not attached, placement could not be confirmed due to current cardiac rhythm.  Subjective: Seen and examined at bedside and was sitting in her chair and had no issues overnight. No CP, SOB, Nausea, Vomiting, lightheadedness or dizziness. No other complaints or concerns at this time.  Discharge Exam: Vitals:   09/12/17 2132 09/13/17 0500  BP: (!) 115/91 (!) 151/69  Pulse: 67 63  Resp: 19 20  Temp: 99.8 F (37.7 C) 98.1 F (36.7 C)  SpO2: 95% 93%   Vitals:   09/12/17 0626 09/12/17 1409 09/12/17 2132 09/13/17 0500  BP: (!) 142/82 126/73 (!) 115/91 (!) 151/69  Pulse: 60 71 67 63  Resp: (!) '24 20 19 20  '$ Temp: 98.9 F (37.2 C) 98.1 F (36.7 C) 99.8 F (37.7 C) 98.1 F (36.7 C)  TempSrc: Oral Oral Oral Oral  SpO2: 91% 95% 95% 93%  Weight:      Height:       General: Pt is alert, awake, not in acute distress Cardiovascular: RRR, S1/S2 +, no rubs, no gallops Respiratory: Diminished bilaterally, no wheezing, no rhonchi Abdominal: Soft, NT, Distended due to body habitus, bowel sounds + Extremities: Mild 1+ LE edema, no cyanosis  The results of significant diagnostics from this hospitalization (including imaging, microbiology, ancillary and laboratory) are listed below for reference.     Microbiology: Recent Results (from the past 240 hour(s))  Culture, blood (routine x 2)     Status: None   Collection Time: 09/07/17 12:26 AM  Result Value Ref Range Status   Specimen Description LEFT ANTECUBITAL DRAWN BY RN  Final   Special Requests   Final    BOTTLES DRAWN AEROBIC AND ANAEROBIC Blood Culture adequate volume   Culture   Final    NO GROWTH 5 DAYS Performed at [facility], [address] [place], [place] [address]    Report Status 09/12/2017 FINAL  Final  Culture, blood (routine x 2)     Status: Abnormal   Collection Time: 09/07/17 12:30 AM  Result Value Ref Range Status   Specimen Description   Final    BLOOD RIGHT ANTECUBITAL Performed at [facility], [place] [address], [place], [place] [address]    Special Requests   Final    BOTTLES DRAWN AEROBIC AND ANAEROBIC Blood Culture adequate volume Performed at [place] [place], [place] [address]    Culture  Setup Time   Final    GRAM POSITIVE COCCI AEROBIC BOTTLE ONLY Gram Stain Report Called to,Read Back By and Verified With: H.FRASER AT WL ON [id number] AT [id number] BY THOMPSON S. CRITICAL RESULT CALLED TO, READ BACK BY AND VERIFIED WITH: PHARMD D ZEIGLER [id number] CM    Culture (A)  Final    STAPHYLOCOCCUS SPECIES (COAGULASE NEGATIVE) THE SIGNIFICANCE OF ISOLATING THIS ORGANISM FROM A SINGLE SET OF BLOOD CULTURES WHEN MULTIPLE SETS ARE DRAWN IS UNCERTAIN. PLEASE NOTIFY THE MICROBIOLOGY DEPARTMENT WITHIN ONE WEEK IF SPECIATION AND SENSITIVITIES ARE REQUIRED. Performed at [facility], [place] [address] [place], [place] [address]    Report Status 09/10/2017 FINAL  Final  Blood Culture ID Panel (Reflexed)     Status: Abnormal   Collection Time: 09/07/17 12:30 AM  Result Value Ref Range Status   Enterococcus species NOT DETECTED NOT DETECTED Final   Listeria monocytogenes NOT DETECTED NOT DETECTED Final   Staphylococcus species DETECTED (A) NOT DETECTED Final    Comment: Methicillin  (  oxacillin) resistant coagulase negative staphylococcus. Possible blood culture contaminant (unless isolated from more than one blood culture draw or clinical case suggests pathogenicity). No antibiotic treatment is indicated for blood  culture contaminants. CRITICAL RESULT CALLED TO, READ BACK BY AND VERIFIED WITH: PHARMD D ZEIGLER [id number] MLM    Staphylococcus aureus NOT DETECTED NOT DETECTED Final   Methicillin resistance DETECTED (A) NOT DETECTED Final    Comment: CRITICAL RESULT CALLED TO, READ BACK BY AND VERIFIED WITH: PHARMD D ZEIGLER [id number] CM    Streptococcus species NOT DETECTED NOT DETECTED Final   Streptococcus agalactiae NOT DETECTED NOT DETECTED Final   Streptococcus pneumoniae NOT DETECTED NOT DETECTED Final   Streptococcus pyogenes NOT DETECTED NOT DETECTED Final   Acinetobacter baumannii NOT DETECTED NOT DETECTED Final   Enterobacteriaceae species NOT DETECTED NOT DETECTED Final   Enterobacter cloacae complex NOT DETECTED NOT DETECTED Final   Escherichia coli NOT DETECTED NOT DETECTED Final   Klebsiella oxytoca NOT DETECTED NOT DETECTED Final   Klebsiella pneumoniae NOT DETECTED NOT DETECTED Final   Proteus species NOT DETECTED NOT DETECTED Final   Serratia marcescens NOT DETECTED NOT DETECTED Final   Haemophilus influenzae NOT DETECTED NOT DETECTED Final   Neisseria meningitidis NOT DETECTED NOT DETECTED Final   Pseudomonas aeruginosa NOT DETECTED NOT DETECTED Final   Candida albicans NOT DETECTED NOT DETECTED Final   Candida glabrata NOT DETECTED NOT DETECTED Final   Candida krusei NOT DETECTED NOT DETECTED Final   Candida parapsilosis NOT DETECTED NOT DETECTED Final   Candida tropicalis NOT DETECTED NOT DETECTED Final    Comment: Performed at [facility], [place]. [address], [place], [place] [address]  Urine culture     Status: Abnormal   Collection Time: 09/07/17  6:58 PM  Result Value Ref Range Status   Specimen Description   Final    URINE,  RANDOM Performed at [facility], [address], [place], [place] [address]    Special Requests   Final    NONE Performed at [facility], [address], [place], [place] [address]    Culture (A)  Final    <10,000 COLONIES/mL INSIGNIFICANT GROWTH Performed at [facility] [address] [place], [place] [address]    Report Status 09/09/2017 FINAL  Final  Blood Culture (routine x 2)     Status: None   Collection Time: 09/07/17  7:07 PM  Result Value Ref Range Status   Specimen Description BLOOD RIGHT FOREARM  Final   Special Requests   Final    BOTTLES DRAWN AEROBIC AND ANAEROBIC Blood Culture adequate volume   Culture   Final    NO GROWTH 5 DAYS Performed at [facility], [address] [address], [place], [place] [address]    Report Status 09/12/2017 FINAL  Final  Blood Culture (routine x 2)     Status: None   Collection Time: 09/07/17  7:11 PM  Result Value Ref Range Status   Specimen Description BLOOD LEFT FOREARM  Final   Special Requests   Final    BOTTLES DRAWN AEROBIC AND ANAEROBIC Blood Culture adequate volume   Culture   Final    NO GROWTH 5 DAYS Performed at [facility], [address], [place], [place] [address]    Report Status 09/12/2017 FINAL  Final  Urine Culture     Status: Abnormal   Collection Time: 09/07/17 11:35 PM  Result Value Ref Range Status   Specimen Description   Final    URINE,  CATHETERIZED CYTOSCOPE Performed at [facility], [place] [address] [place], [place] [address]    Special Requests   Final    NONE Performed at [facility], [place] [address], [place], [place] [address]    Culture >=100,000 COLONIES/mL ESCHERICHIA COLI (A)  Final   Report Status 09/11/2017 FINAL  Final   Organism ID, Bacteria ESCHERICHIA COLI (A)  Final      Susceptibility   Escherichia coli - MIC*    AMPICILLIN >=32 RESISTANT Resistant     CEFAZOLIN >=64 RESISTANT Resistant     CEFTRIAXONE >=64 RESISTANT Resistant     CIPROFLOXACIN  >=4 RESISTANT Resistant     GENTAMICIN <=1 SENSITIVE Sensitive     IMIPENEM <=0.25 SENSITIVE Sensitive     NITROFURANTOIN <=16 SENSITIVE Sensitive     TRIMETH/SULFA >=320 RESISTANT Resistant     AMPICILLIN/SULBACTAM >=32 RESISTANT Resistant     PIP/TAZO 16 SENSITIVE Sensitive     * >=100,000 COLONIES/mL ESCHERICHIA COLI  MRSA PCR Screening     Status: None   Collection Time: 09/08/17  3:30 AM  Result Value Ref Range Status   MRSA by PCR NEGATIVE NEGATIVE Final    Comment:        The GeneXpert MRSA Assay (FDA approved for NASAL specimens only), is one component of a comprehensive MRSA colonization surveillance program. It is not intended to diagnose MRSA infection nor to guide or monitor treatment for MRSA infections. Performed at [facility], [place] [address] [place], [place] [address]   Culture, blood (routine x 2)     Status: None (Preliminary result)   Collection Time: 09/09/17  5:43 PM  Result Value Ref Range Status   Specimen Description   Final    BLOOD RIGHT ARM Performed at [place] [address] [place], [place] [address]    Special Requests   Final    BOTTLES DRAWN AEROBIC ONLY Blood Culture adequate volume Performed at [place] [address], [place], [place] [address]    Culture   Final    NO GROWTH 3 DAYS Performed at [facility], [place] [address] [place], [place] [address]    Report Status PENDING  Incomplete  Culture, blood (routine x 2)     Status: None (Preliminary result)   Collection Time: 09/09/17  5:43 PM  Result Value Ref Range Status   Specimen Description   Final    BLOOD RIGHT WRIST Performed at [place] [address] [place], [place] [address]    Special Requests   Final    BOTTLES DRAWN AEROBIC ONLY Blood Culture adequate volume Performed at [place] [address] [place], [place] [address]    Culture   Final     NO GROWTH 3 DAYS Performed at [facility], [place] [address], [place], [place] [address]    Report Status PENDING  Incomplete    Labs: BNP (last 3 results) No results for input(s): BNP in the last 8760 hours. Basic Metabolic Panel: Recent Labs  Lab 09/09/17 1020 09/10/17 0323 09/11/17 0348 09/12/17 0549 09/13/17 0449  NA 143 143 143 143 141  K 4.6 4.6 4.0 4.4 4.2  CL 109 106 106 104 105  CO2 '26 26 26 29 25  '$ GLUCOSE 179* 159* 158* 158* 140*  BUN 19 23* '16 14 16  '$ CREATININE 0.75 1.00 0.72 0.83 0.78  CALCIUM 8.7* 8.4* 8.6* 8.9 8.8*  MG  1.9 1.8 1.8 1.9 2.0  PHOS 3.0 4.2 3.9 4.2 4.0   Liver Function Tests: Recent Labs  Lab 09/09/17 1020 09/10/17 0323 09/11/17 0348 09/12/17 0549 09/13/17 0449  AST 19 16 13* 20 24  ALT '19 18 17 19 23  '$ ALKPHOS 43 42 47 47 48  BILITOT 0.4 0.4 0.6 0.7 0.8  PROT 6.0* 5.8* 6.2* 6.5 6.4*  ALBUMIN 2.3* 2.3* 2.6* 2.7* 2.9*   No results for input(s): LIPASE, AMYLASE in the last 168 hours. No results for input(s): AMMONIA in the last 168 hours. CBC: Recent Labs  Lab 09/09/17 1020 09/10/17 0323 09/11/17 0348 09/12/17 0549 09/13/17 0449  WBC 4.0 4.0 3.5* 4.0 4.5  NEUTROABS 2.1 1.8 1.4* 1.7 1.8  HGB 8.2* 8.3* 8.7* 9.6* 9.6*  HCT 26.7* 26.8* 27.4* 30.4* 30.0*  MCV 95.4 95.4 92.3 93.0 92.3  PLT 197 212 211 202 196   Cardiac Enzymes: Recent Labs  Lab 09/07/17 1925  TROPONINI <0.03   BNP: Invalid input(s): POCBNP CBG: Recent Labs  Lab 09/08/17 0321 09/08/17 1953  GLUCAP 221* 240*   D-Dimer No results for input(s): DDIMER in the last 72 hours. Hgb A1c No results for input(s): HGBA1C in the last 72 hours. Lipid Profile No results for input(s): CHOL, HDL, LDLCALC, TRIG, CHOLHDL, LDLDIRECT in the last 72 hours. Thyroid function studies No results for input(s): TSH, T4TOTAL, T3FREE, THYROIDAB in the last 72 hours.  Invalid input(s): FREET3 Anemia work up No results for input(s): VITAMINB12, FOLATE, FERRITIN, TIBC, IRON,  RETICCTPCT in the last 72 hours. Urinalysis    Component Value Date/Time   COLORURINE YELLOW 09/07/2017 1857   APPEARANCEUR CLEAR 09/07/2017 1857   LABSPEC 1.014 09/07/2017 1857   PHURINE 6.0 09/07/2017 1857   GLUCOSEU NEGATIVE 09/07/2017 1857   HGBUR MODERATE (A) 09/07/2017 1857   BILIRUBINUR NEGATIVE 09/07/2017 1857   KETONESUR NEGATIVE 09/07/2017 1857   PROTEINUR 100 (A) 09/07/2017 1857   NITRITE NEGATIVE 09/07/2017 1857   LEUKOCYTESUR TRACE (A) 09/07/2017 1857   Sepsis Labs Invalid input(s): PROCALCITONIN,  WBC,  LACTICIDVEN Microbiology Recent Results (from the past 240 hour(s))  Culture, blood (routine x 2)     Status: None   Collection Time: 09/07/17 12:26 AM  Result Value Ref Range Status   Specimen Description LEFT ANTECUBITAL DRAWN BY RN  Final   Special Requests   Final    BOTTLES DRAWN AEROBIC AND ANAEROBIC Blood Culture adequate volume   Culture   Final    NO GROWTH 5 DAYS Performed at [facility], [address] [place], [place] [address]    Report Status 09/12/2017 FINAL  Final  Culture, blood (routine x 2)     Status: Abnormal   Collection Time: 09/07/17 12:30 AM  Result Value Ref Range Status   Specimen Description   Final    BLOOD RIGHT ANTECUBITAL Performed at [facility], [place] [address] [place], [place] [address]    Special Requests   Final    BOTTLES DRAWN AEROBIC AND ANAEROBIC Blood Culture adequate volume Performed at [place] [place], [place] [address]    Culture  Setup Time   Final    GRAM POSITIVE COCCI AEROBIC BOTTLE ONLY Gram Stain Report Called to,Read Back By and Verified With: H.FRASER AT [facility] ON [id number] AT 0954A BY THOMPSON S. CRITICAL RESULT CALLED TO, READ BACK BY AND VERIFIED WITH: PHARMD D ZEIGLER [id number] CM    Culture (A)  Final    STAPHYLOCOCCUS SPECIES (COAGULASE NEGATIVE)  THE SIGNIFICANCE OF ISOLATING THIS ORGANISM FROM A SINGLE SET OF BLOOD CULTURES WHEN MULTIPLE SETS ARE DRAWN IS UNCERTAIN. PLEASE  NOTIFY THE MICROBIOLOGY DEPARTMENT WITHIN ONE WEEK IF SPECIATION AND SENSITIVITIES ARE REQUIRED. Performed at [facility], [place] [address], [place], [place] [address]    Report Status 09/10/2017 FINAL  Final  Blood Culture ID Panel (Reflexed)     Status: Abnormal   Collection Time: 09/07/17 12:30 AM  Result Value Ref Range Status   Enterococcus species NOT DETECTED NOT DETECTED Final   Listeria monocytogenes NOT DETECTED NOT DETECTED Final   Staphylococcus species DETECTED (A) NOT DETECTED Final    Comment: Methicillin (oxacillin) resistant coagulase negative staphylococcus. Possible blood culture contaminant (unless isolated from more than one blood culture draw or clinical case suggests pathogenicity). No antibiotic treatment is indicated for blood  culture contaminants. CRITICAL RESULT CALLED TO, READ BACK BY AND VERIFIED WITH: PHARMD D ZEIGLER [id number] MLM    Staphylococcus aureus NOT DETECTED NOT DETECTED Final   Methicillin resistance DETECTED (A) NOT DETECTED Final    Comment: CRITICAL RESULT CALLED TO, READ BACK BY AND VERIFIED WITH: PHARMD D ZEIGLER [id number] CM    Streptococcus species NOT DETECTED NOT DETECTED Final   Streptococcus agalactiae NOT DETECTED NOT DETECTED Final   Streptococcus pneumoniae NOT DETECTED NOT DETECTED Final   Streptococcus pyogenes NOT DETECTED NOT DETECTED Final   Acinetobacter baumannii NOT DETECTED NOT DETECTED Final   Enterobacteriaceae species NOT DETECTED NOT DETECTED Final   Enterobacter cloacae complex NOT DETECTED NOT DETECTED Final   Escherichia coli NOT DETECTED NOT DETECTED Final   Klebsiella oxytoca NOT DETECTED NOT DETECTED Final   Klebsiella pneumoniae NOT DETECTED NOT DETECTED Final   Proteus species NOT DETECTED NOT DETECTED Final   Serratia marcescens NOT DETECTED NOT DETECTED Final   Haemophilus influenzae NOT DETECTED NOT DETECTED Final   Neisseria meningitidis NOT DETECTED NOT DETECTED Final   Pseudomonas aeruginosa  NOT DETECTED NOT DETECTED Final   Candida albicans NOT DETECTED NOT DETECTED Final   Candida glabrata NOT DETECTED NOT DETECTED Final   Candida krusei NOT DETECTED NOT DETECTED Final   Candida parapsilosis NOT DETECTED NOT DETECTED Final   Candida tropicalis NOT DETECTED NOT DETECTED Final    Comment: Performed at [facility], [place] [address] [place], [place] [address]  Urine culture     Status: Abnormal   Collection Time: 09/07/17  6:58 PM  Result Value Ref Range Status   Specimen Description   Final    URINE, RANDOM Performed at [facility], [address], [place], [place] [address]    Special Requests   Final    NONE Performed at [facility], [address], [place], [place] [address]    Culture (A)  Final    <10,000 COLONIES/mL INSIGNIFICANT GROWTH Performed at [place] [address] [place],  [address]    Report Status 09/09/2017 FINAL  Final  Blood Culture (routine x 2)     Status: None   Collection Time: 09/07/17  7:07 PM  Result Value Ref Range Status   Specimen Description BLOOD RIGHT FOREARM  Final   Special Requests   Final    BOTTLES DRAWN AEROBIC AND ANAEROBIC Blood Culture adequate volume   Culture   Final    NO GROWTH 5 DAYS Performed at [facility], [address] [place],  [address]    Report Status 09/12/2017 FINAL  Final  Blood Culture (routine x 2)  Status: None   Collection Time: 09/07/17  7:11 PM  Result Value Ref Range Status   Specimen Description BLOOD LEFT FOREARM  Final   Special Requests   Final    BOTTLES DRAWN AEROBIC AND ANAEROBIC Blood Culture adequate volume   Culture   Final    NO GROWTH 5 DAYS Performed at [facility], [address], [place], [place] [address]    Report Status 09/12/2017 FINAL  Final  Urine Culture     Status: Abnormal   Collection Time: 09/07/17 11:35 PM  Result Value Ref Range Status   Specimen Description   Final    URINE, CATHETERIZED CYTOSCOPE Performed at [place] [address] [place], [place] [address]    Special Requests   Final    NONE Performed at [facility], [place] [address] [place], [place] [address]    Culture >=100,000 COLONIES/mL ESCHERICHIA COLI (A)  Final   Report Status 09/11/2017 FINAL  Final   Organism ID, Bacteria ESCHERICHIA COLI (A)  Final      Susceptibility   Escherichia coli - MIC*    AMPICILLIN >=32 RESISTANT Resistant     CEFAZOLIN >=64 RESISTANT Resistant     CEFTRIAXONE >=64 RESISTANT Resistant     CIPROFLOXACIN >=4 RESISTANT Resistant     GENTAMICIN <=1 SENSITIVE Sensitive     IMIPENEM <=0.25 SENSITIVE Sensitive     NITROFURANTOIN <=16 SENSITIVE Sensitive     TRIMETH/SULFA >=320 RESISTANT Resistant     AMPICILLIN/SULBACTAM >=32 RESISTANT Resistant     PIP/TAZO 16 SENSITIVE Sensitive     * >=100,000 COLONIES/mL ESCHERICHIA COLI  MRSA PCR Screening     Status: None   Collection Time: 09/08/17  3:30 AM  Result Value Ref Range Status   MRSA by PCR NEGATIVE NEGATIVE Final    Comment:        The GeneXpert MRSA Assay (FDA approved for NASAL specimens only), is one component of a comprehensive MRSA colonization surveillance program. It is not intended to diagnose MRSA infection nor to guide or monitor treatment for MRSA infections. Performed at [facility], [place] [address] [place], [place] [address]   Culture, blood (routine x 2)     Status: None (Preliminary result)   Collection Time: 09/09/17  5:43 PM  Result Value Ref Range Status   Specimen Description   Final    BLOOD RIGHT ARM Performed at [place] [address], [place], [place] [address]    Special Requests   Final    BOTTLES DRAWN AEROBIC ONLY Blood Culture adequate volume Performed at [place] [address], [place], [place] [address]    Culture   Final    NO GROWTH 3 DAYS Performed at [facility], [place] [address],  [place], [place] [address]    Report Status PENDING  Incomplete  Culture, blood (routine x 2)     Status: None (Preliminary result)   Collection Time: 09/09/17  5:43 PM  Result Value Ref Range Status   Specimen Description   Final    BLOOD RIGHT WRIST Performed at [place] [address], [place],  [address]    Special Requests   Final    BOTTLES DRAWN AEROBIC ONLY Blood Culture adequate volume Performed at [facility] [address] [place],  [address]    Culture   Final    NO GROWTH 3 DAYS Performed at [facility], [place] [address] [place], [place] [address]    Report Status PENDING  Incomplete   Time coordinating discharge: 35 minutes  SIGNED:  Kerney Elbe, DO [facility] 09/13/2017, 10:45 AM Pager [phone]  If 7PM-7AM, please contact night-coverage [email or web address] Password [email or web address]

## 2017-09-13 NOTE — Progress Notes (Addendum)
Pt returning to [facility] SNF at [place] [phone]  (was admitted there for short term rehab prior to hospital admission- plan to return to finish course of IV antibiotics) Facility initiated [facility] authorization. Will arrange [facility]AR transportation when pt ready for DC, provided DC information via the [place].  Spoke with pt's sister- will meet pt at [facility] later today.  Sharren Bridge, MSW, LCSW Clinical Social Work 09/13/2017 [phone]    .

## 2017-09-14 ENCOUNTER — Encounter: Payer: Self-pay | Admitting: Internal Medicine

## 2017-09-14 ENCOUNTER — Non-Acute Institutional Stay (SKILLED_NURSING_FACILITY): Payer: Medicare Other | Admitting: Internal Medicine

## 2017-09-14 ENCOUNTER — Encounter (HOSPITAL_COMMUNITY)
Admission: RE | Admit: 2017-09-14 | Discharge: 2017-09-14 | Disposition: A | Discharge status: Home / Self Care | Payer: Medicare Other | Source: Skilled Nursing Facility | Attending: Internal Medicine | Admitting: Internal Medicine

## 2017-09-14 DIAGNOSIS — N10 Acute tubulo-interstitial nephritis: Secondary | ICD-10-CM | POA: Insufficient documentation

## 2017-09-14 DIAGNOSIS — D696 Thrombocytopenia, unspecified: Secondary | ICD-10-CM

## 2017-09-14 DIAGNOSIS — F319 Bipolar disorder, unspecified: Secondary | ICD-10-CM | POA: Diagnosis not present

## 2017-09-14 DIAGNOSIS — N12 Tubulo-interstitial nephritis, not specified as acute or chronic: Secondary | ICD-10-CM

## 2017-09-14 DIAGNOSIS — Z978 Presence of other specified devices: Secondary | ICD-10-CM | POA: Insufficient documentation

## 2017-09-14 DIAGNOSIS — R509 Fever, unspecified: Secondary | ICD-10-CM | POA: Insufficient documentation

## 2017-09-14 DIAGNOSIS — N1330 Unspecified hydronephrosis: Secondary | ICD-10-CM | POA: Insufficient documentation

## 2017-09-14 DIAGNOSIS — N135 Crossing vessel and stricture of ureter without hydronephrosis: Secondary | ICD-10-CM

## 2017-09-14 DIAGNOSIS — R262 Difficulty in walking, not elsewhere classified: Secondary | ICD-10-CM | POA: Insufficient documentation

## 2017-09-14 DIAGNOSIS — R279 Unspecified lack of coordination: Secondary | ICD-10-CM | POA: Insufficient documentation

## 2017-09-14 DIAGNOSIS — J449 Chronic obstructive pulmonary disease, unspecified: Secondary | ICD-10-CM | POA: Diagnosis not present

## 2017-09-14 DIAGNOSIS — G250 Essential tremor: Secondary | ICD-10-CM | POA: Diagnosis not present

## 2017-09-14 LAB — BASIC METABOLIC PANEL
Anion gap: 7 (ref 5–15)
BUN: 19 mg/dL (ref 6–20)
CALCIUM: 8.9 mg/dL (ref 8.9–10.3)
CO2: 28 mmol/L (ref 22–32)
CREATININE: 0.77 mg/dL (ref 0.44–1.00)
Chloride: 104 mmol/L (ref 101–111)
Glucose, Bld: 139 mg/dL — ABNORMAL HIGH (ref 65–99)
Potassium: 4.7 mmol/L (ref 3.5–5.1)
SODIUM: 139 mmol/L (ref 135–145)

## 2017-09-14 LAB — CULTURE, BLOOD (ROUTINE X 2)
Culture: NO GROWTH
Culture: NO GROWTH
Special Requests: ADEQUATE
Special Requests: ADEQUATE

## 2017-09-14 LAB — CBC WITH DIFFERENTIAL/PLATELET
BASOS PCT: 1 %
Basophils Absolute: 0 10*3/uL (ref 0.0–0.1)
EOS ABS: 0.1 10*3/uL (ref 0.0–0.7)
EOS PCT: 2 %
HCT: 33 % — ABNORMAL LOW (ref 36.0–46.0)
Hemoglobin: 10.3 g/dL — ABNORMAL LOW (ref 12.0–15.0)
LYMPHS ABS: 2.5 10*3/uL (ref 0.7–4.0)
Lymphocytes Relative: 46 %
MCH: 29.1 pg (ref 26.0–34.0)
MCHC: 31.2 g/dL (ref 30.0–36.0)
MCV: 93.2 fL (ref 78.0–100.0)
MONOS PCT: 11 %
Monocytes Absolute: 0.6 10*3/uL (ref 0.1–1.0)
Neutro Abs: 2.2 10*3/uL (ref 1.7–7.7)
Neutrophils Relative %: 40 %
PLATELETS: 211 10*3/uL (ref 150–400)
RBC: 3.54 MIL/uL — ABNORMAL LOW (ref 3.87–5.11)
RDW: 13.7 % (ref 11.5–15.5)
WBC: 5.6 10*3/uL (ref 4.0–10.5)

## 2017-09-14 LAB — C-REACTIVE PROTEIN: CRP: <0.8 mg/dL (ref <1.0)

## 2017-09-14 LAB — SEDIMENTATION RATE: SED RATE: 62 mm/h — AB (ref 0–22)

## 2017-09-14 NOTE — Progress Notes (Signed)
Provider: Ander Purpura  Location:   [place] Room Number: 159/P Place of Service:  SNF (31)  PCP: Salvadore Dom, MD Patient Care Team: Salvadore Dom, MD as PCP - General (Obstetrics and Gynecology)  Extended Emergency Contact Information Primary Emergency Contact: [facility] Address: [place],  [place] Home Phone: [phone] Relation: Sister  Code Status: Full Code Goals of Care: Advanced Directive information Advanced Directives 09/14/2017  Does Patient Have a Medical Advance Directive? Yes  Type of Advance Directive (No Data)  Does patient want to make changes to medical advance directive? No - Patient declined  Would patient like information on creating a medical advance directive? No - Patient declined      Chief Complaint  Patient presents with  . Readmit To SNF    Readmission Visit    HPI: Patient is a 67 y.o. female seen today for Readmission to SNF for therapy and IV antibiotics after undergoing Left Cystoscopy with Urethral Stent Placement on 05/07 Patient Initially was admitted in SNF on 04/25  for IV antibiotics for Left UPJ obstruction and Pyelonephritis.But patient kept on spiking low grade temp with Sweats after finishing her Course. She was send to the ED again for fever of 104 . And was taken to OR next day and underwent Cystoscopy with leftretrograde pyelogram,left ureteral catheterization with aspiration of 350 cc of pus for culture,and leftureteral stent placement. Post operatively she remained on vent and was admitted to ICU. She was extubated on 05/08 Her E Coli was sensitive for only IV antibiotics so she was send back to SNF With PICC line and eventual discharge to her ALF.after finishing her antibiotics.  Patient is doing well .Denies any Fever or chills. No Abdominal Pain. No SOB or Cough.  Past Medical History:  Diagnosis Date  . Acute pyelonephritis   . Anxiety   . Bipolar  disorder, unspecified ([place])    From [facility] form  . Chronic respiratory failure with hypoxia ([place])   . COPD (chronic obstructive pulmonary disease) ([place])   . Dermatitis   . Dermatitis   . Dysrhythmia   . Essential tremor   . Fever   . History of ESBL E. coli infection   . Hyperlipidemia   . Hypothyroidism   . Thrombocytopenia ([place])   . Unspecified hydronephrosis   . Urinary incontinence    Past Surgical History:  Procedure Laterality Date  . CYSTOSCOPY W/ URETERAL STENT PLACEMENT Left 09/07/2017   Procedure: CYSTOSCOPY WITH RETROGRADE PYELOGRAM/URETERAL STENT PLACEMENT;  Surgeon: Lucas Mallow, MD;  Location: [facility];  Service: Urology;  Laterality: Left;    reports that she has been smoking.  She has never used smokeless tobacco. She reports that she drank alcohol. She reports that she has current or past drug history. Social History   Socioeconomic History  . Marital status: Single    Spouse name: Not on file  . Number of children: Not on file  . Years of education: Not on file  . Highest education level: Not on file  Occupational History  . Not on file  Social Needs  . Financial resource strain: Not on file  . Food insecurity:    Worry: Not on file    Inability: Not on file  . Transportation needs:    Medical: Not on file    Non-medical: Not on file  Tobacco Use  . Smoking status: Current Some Day  Smoker  . Smokeless tobacco: Never Used  Substance and Sexual Activity  . Alcohol use: Not Currently  . Drug use: Not Currently  . Sexual activity: Not Currently  Lifestyle  . Physical activity:    Days per week: Not on file    Minutes per session: Not on file  . Stress: Not on file  Relationships  . Social connections:    Talks on phone: Not on file    Gets together: Not on file    Attends religious service: Not on file    Active member of club or organization: Not on file    Attends meetings of clubs or organizations: Not on file    Relationship status:  Not on file  . Intimate partner violence:    Fear of current or ex partner: Not on file    Emotionally abused: Not on file    Physically abused: Not on file    Forced sexual activity: Not on file  Other Topics Concern  . Not on file  Social History Narrative  . Not on file    Functional Status Survey:    History reviewed. No pertinent family history.  Health Maintenance  Topic Date Due  . DEXA SCAN  09/29/2017 (Originally 03/10/2016)  . COLONOSCOPY  09/29/2017 (Originally 03/10/2001)  . TETANUS/TDAP  09/29/2017 (Originally 03/10/1970)  . Hepatitis C Screening  09/29/2017 (Originally 1951/01/06)  . PNA vac Low Risk Adult (1 of 2 - PCV13) 09/29/2017 (Originally 03/10/2016)  . INFLUENZA VACCINE  12/02/2017  . MAMMOGRAM  09/01/2018    Allergies  Allergen Reactions  . Penicillins Hives, Rash and Other (See Comments)    Not effective per patient. Pt does not know. She states it was when she was a child. Unknown    Allergies as of 09/14/2017      Reactions   Penicillins Hives, Rash, Other (See Comments)   Not effective per patient. Pt does not know. She states it was when she was a child. Unknown      Medication List    Notice   This visit is during an admission. Changes to the med list made in this visit will be reflected in the After Visit Summary of the admission.     Review of Systems  Review of Systems  Constitutional: Negative for activity change, appetite change, chills, diaphoresis, fatigue and fever.  HENT: Negative for mouth sores, postnasal drip, rhinorrhea, sinus pain and sore throat.   Respiratory: Negative for apnea, cough, chest tightness, shortness of breath and wheezing.   Cardiovascular: Negative for chest pain, palpitations and leg swelling.  Gastrointestinal: Negative for abdominal distention, abdominal pain, constipation, diarrhea, nausea and vomiting.  Genitourinary: Negative for dysuria and frequency.  Musculoskeletal: Negative for arthralgias, joint  swelling and myalgias.  Skin: Negative for rash.  Neurological: Negative for dizziness, syncope, weakness, light-headedness and numbness.  Psychiatric/Behavioral: Negative for behavioral problems, confusion and sleep disturbance.     Vitals:   09/14/17 1102  BP: (!) 141/88  Pulse: 98  Resp: 18  Temp: 98.4 F (36.9 C)  TempSrc: Oral  SpO2: 94%   There is no height or weight on file to calculate BMI. Physical Exam  Constitutional: She is oriented to person, place, and time. She appears well-developed and well-nourished.  HENT:  Head: Normocephalic.  Mouth/Throat: Oropharynx is clear and moist.  Eyes: Pupils are equal, round, and reactive to light.  Neck: Neck supple.  Cardiovascular: Normal rate and regular rhythm.  No murmur heard. Pulmonary/Chest: Effort normal  and breath sounds normal. No stridor. No respiratory distress. She has no wheezes.  Abdominal: Soft. Bowel sounds are normal. She exhibits no distension. There is no tenderness. There is no guarding.  Musculoskeletal: She exhibits no edema.  Lymphadenopathy:    She has no cervical adenopathy.  Neurological: She is alert and oriented to person, place, and time.  No deficits  Skin: Skin is warm and dry.  Psychiatric: She has a normal mood and affect. Her behavior is normal. Thought content normal.    Labs reviewed: Basic Metabolic Panel: Recent Labs    09/11/17 0348 09/12/17 0549 09/13/17 0449 09/14/17 0400  NA 143 143 141 139  K 4.0 4.4 4.2 4.7  CL 106 104 105 104  CO2 26 29 25 28   GLUCOSE 158* 158* 140* 139*  BUN 16 14 16 19   CREATININE 0.72 0.83 0.78 0.77  CALCIUM 8.6* 8.9 8.8* 8.9  MG 1.8 1.9 2.0  --   PHOS 3.9 4.2 4.0  --    Liver Function Tests: Recent Labs    09/11/17 0348 09/12/17 0549 09/13/17 0449  AST 13* 20 24  ALT 17 19 23   ALKPHOS 47 47 48  BILITOT 0.6 0.7 0.8  PROT 6.2* 6.5 6.4*  ALBUMIN 2.6* 2.7* 2.9*   Recent Labs    08/21/17 1713  LIPASE 48   Recent Labs     08/05/17 2352  AMMONIA 40*   CBC: Recent Labs    09/12/17 0549 09/13/17 0449 09/14/17 0400  WBC 4.0 4.5 5.6  NEUTROABS 1.7 1.8 2.2  HGB 9.6* 9.6* 10.3*  HCT 30.4* 30.0* 33.0*  MCV 93.0 92.3 93.2  PLT 202 196 211   Cardiac Enzymes: Recent Labs    08/21/17 1713 09/07/17 1925  TROPONINI <0.03 <0.03   BNP: Invalid input(s): POCBNP No results found for: HGBA1C Lab Results  Component Value Date   TSH 10.134 (H) 08/30/2017   Lab Results  Component Value Date   VITAMINB12 671 09/06/2017   Lab Results  Component Value Date   FOLATE 32.1 09/06/2017   No results found for: IRON, TIBC, FERRITIN  Imaging and Procedures obtained prior to SNF admission: No results found.  Assessment/Plan  .Multidrug-resistant E. Coli pyelonephritis with Left UPJ obstrutionS/P left urethral Stent placement Patient will continue IV Invanz 7 more days Last day is 05/21 She is afebrile Patient has Follow up appointment with urology Discontinue Oxycodone as not using it. Blood Cultures so far negative. Initial were thought ot be due to Contamination.  Respiratory Failure Requiring ventilation On Room air now. Continue Bronchodilators. Chronic thrombocytopenia Platelets stable. History of CHF Continue on Coreg Not on any diuretic Continue with aspirin COPD  continue bronchodilators No ton any Oxygen now. Hyperlipidemia Continue atorvastatin Bipolar disorder  On valproic acid and Lexapro and Klonipin Hand Tremors On Primidone Hypothyroid Continue levothyroxine TSH was elevated. Dose is not changed . Recommend Follow up in 4 weeks as not sure if patient was taking it properly at home.   Family/ staff Communication:   Labs/tests ordered: Total time spent in this patient care encounter was 45_ minutes; greater than 50% of the visit spent counseling patient, reviewing records , Labs and coordinating care for problems addressed at this encounter.

## 2017-09-15 ENCOUNTER — Encounter: Payer: Self-pay | Admitting: Internal Medicine

## 2017-09-15 ENCOUNTER — Non-Acute Institutional Stay (SKILLED_NURSING_FACILITY): Payer: Medicare Other | Admitting: Internal Medicine

## 2017-09-15 DIAGNOSIS — B379 Candidiasis, unspecified: Secondary | ICD-10-CM | POA: Diagnosis not present

## 2017-09-15 DIAGNOSIS — N12 Tubulo-interstitial nephritis, not specified as acute or chronic: Secondary | ICD-10-CM

## 2017-09-15 NOTE — Progress Notes (Signed)
This is an acute visit.  Level care skilled.  Facility is [facility].  Chief complaint acute visit secondary to vaginal rash and itching.  History of present illness.   patient is a pleasant 67 year old female who is just readmitted from facility for therapy after getting IV antibiotics undergoing a left cystoscopy with urethral stent placement on May 7  She was initially admitted to skilled nursing for IV antibiotics for left UPJ obstruction and pyelonephritis but she kept on spiking low temperatures with sweats despite finishing her antibiotics.  She had a fever of 104 and went to the ER and was taken to the OR the next day and underwent a cystoscopy with left retrograde pyelogram left ureteral catheterization with aspiration of pus for culture and a left ureteral stent placement.  She did require event postop and was admitted to ICU she was extubated on May 8.  E. coli was sensitive for only IV antibiotics so we sent back to skilled nursing with a PICC line when she will be discharged to assisted living when her antibiotics are finished.  Currently she has no complaints says she is feeling significant better only complaint is she has vaginal itching she which she says is quite uncomfortable and appears she does have a rash-which was noted previously by nursing.--she ihas bee started now she was started on topical clotimazole QD  Vital signs appear to be stable Review of systems.  In general she is not complaining of fever chills says she feels much better.  The  Skin is complaining of vaginal itching and irritation.  Respiratory is not complaining of shortness of breath or cough.   Past Medical History:  Diagnosis Date  . Acute pyelonephritis   . Anxiety   . Bipolar disorder, unspecified ([place])    From [facility] form  . Chronic respiratory failure with hypoxia ()   . COPD (chronic obstructive pulmonary disease) ([place])   . Dermatitis   . Dermatitis   .  Dysrhythmia   . Essential tremor   . Fever   . History of ESBL E. coli infection   . Hyperlipidemia   . Hypothyroidism   . Thrombocytopenia ([place])   . Unspecified hydronephrosis   . Urinary incontinence    Past Surgical History:  Procedure Laterality Date  . CYSTOSCOPY W/ URETERAL STENT PLACEMENT Left 09/07/2017   Procedure: CYSTOSCOPY WITH RETROGRADE PYELOGRAM/URETERAL STENT PLACEMENT;  Surgeon: Lucas Mallow, MD;  Location: [facility];  Service: Urology;  Laterality: Left;    reports that she has been smoking.  She has never used smokeless tobacco. She reports that she drank alcohol. She reports that she has current or past drug history. Social History        Socioeconomic History  . Marital status: Single    Spouse name: Not on file  . Number of children: Not on file  . Years of education: Not on file  . Highest education level: Not on file  Occupational History  . Not on file  Social Needs  . Financial resource strain: Not on file  . Food insecurity:    Worry: Not on file    Inability: Not on file  . Transportation needs:    Medical: Not on file    Non-medical: Not on file  Tobacco Use  . Smoking status: Current Some Day Smoker  . Smokeless tobacco: Never Used  Substance and Sexual Activity  . Alcohol use: Not Currently  . Drug use: Not Currently  . Sexual  activity: Not Currently  Lifestyle  . Physical activity:    Days per week: Not on file    Minutes per session: Not on file  . Stress: Not on file  Relationships  . Social connections:    Talks on phone: Not on file    Gets together: Not on file    Attends religious service: Not on file    Active member of club or organization: Not on file    Attends meetings of clubs or organizations: Not on file    Relationship status: Not on file  . Intimate partner violence:    Fear of current or ex partner: Not on file    Emotionally abused: Not on file    Physically  abused: Not on file    Forced sexual activity: Not on file  Other Topics Concern  . Not on file  Social History Narrative  . Not on file    Functional Status Survey:  History reviewed. No pertinent family history.  Health Maintenance  Topic Date Due  . DEXA SCAN  09/29/2017 (Originally 03/10/2016)  . COLONOSCOPY  09/29/2017 (Originally 03/10/2001)  . TETANUS/TDAP  09/29/2017 (Originally 03/10/1970)  . Hepatitis C Screening  09/29/2017 (Originally 1950-06-08)  . PNA vac Low Risk Adult (1 of 2 - PCV13) 09/29/2017 (Originally 03/10/2016)  . INFLUENZA VACCINE  12/02/2017  . MAMMOGRAM  09/01/2018         Allergies  Allergen Reactions  . Penicillins Hives, Rash and Other (See Comments)    Not effective per patient. Pt does not know. She states it was when she was a child. Unknown         Allergies as of 09/14/2017      Reactions   Penicillins Hives, Rash, Other (See Comments)   Not effective per patient. Pt does not know. She states it was when she was a child. Unknown       Medications.  Albuterol inhaler 90 mcg 1 puff every 6 hours as needed.  Aspirin 81 mg every morning.  Atorvastatin 40 mg nightly.  Coreg 3.125 mg twice daily.  Clonazepam 0.5 mg twice daily.  HWEXHBZJIRCV--8% vaginal applicator nightly.  Colace 100 mg daily PRN.  Multivitamin daily.  Neurontin 300 mg 4 times daily.  Invanz 1 g IV injection daily for 8 days.  Duo nebs every 6 hours as needed.  Lexapro 10 mg every morning.  Claritin 10 mg every morning.  MiraLAX as needed.  Once a day.  Percocet 5-325 mg every 4 hours as needed.  Primidone 50 mg nightly.  Senokot nightly.  Synthroid 125 mcg daily.  Tylenol 650 mg every 6 hours as needed   Review of systems.  In general she is not complaining of fever chills.  Skin does complain of vaginal itching and a rash.  Respiratory is not complaining of shortness of breath or cough.  Cardiac does not complain of  chest pain.  GI is not complaining of abdominal discomfort nausea vomiting diarrhea constipation.  GU is not complaining of dysuria again does have an extensive history here as noted above.  Musculoskeletal is not complaining of joint pain.  Neurologic is not complaining of dizziness or headache.   Physical exam.  Temperature 98.0 pulse 85 respirations 20 blood pressure 120/81.  In general this is a pleasant   female in no distress.      Her skin is warm and dry vaginal area she does have a  Bilateral vaginal rash which appears confluent.  Chest is  clear to auscultation there is no labored breathing.  Heart is regular rate and rhythm without murmur gallop rub she does not really have significant lower extremity edema.  Abdomen is soft nontender with positive bowel sounds.  GU rash as noted above I do not note any drainage at this time.  Musculoskeletal is able to stand without assistance strength appears intact all 4 extremities.  Neurologic is grossly intact her speech is clear no lateralizing findings.  Psych she has normal mood and affect.  Labs.  -May 14-- 2019.  C-reactive protein was less than 0.8 sed rate was 62-sodium 139 potassium 4.7 BUN 19 creatinine 0.77.  CBC showed a white count of 5.6 hemoglobin of 10.3 and platelets of 211.  Assessment plan.  1.-  Vaginal rash she has been started on clotrimazole 1% vaginally nightly for 7 days- also consider Diflucan 100 mg daily for 3 days.  2.   .Multidrug-resistant E. Coli pyelonephritis with Left UPJ obstrutionS/P left urethral Stent placement Blood cultures so far are negative   she is on IV Invanz for 6 more days she is afebrile appears to be doing well will have follow-up with urology  [phone]  -

## 2017-09-17 ENCOUNTER — Other Ambulatory Visit: Payer: Self-pay

## 2017-09-17 MED ORDER — OXYCODONE-ACETAMINOPHEN 5-325 MG PO TABS: 1.0000 | ORAL_TABLET | ORAL | 0 refills | Status: DC | PRN

## 2017-09-17 MED ORDER — CLONAZEPAM 0.5 MG PO TABS: 0.5000 mg | ORAL_TABLET | Freq: Two times a day (BID) | ORAL | 0 refills | Status: AC

## 2017-09-17 NOTE — Telephone Encounter (Signed)
RX Fax for [facility]@ [phone]  

## 2017-09-20 ENCOUNTER — Ambulatory Visit (HOSPITAL_COMMUNITY)
Admission: RE | Admit: 2017-09-20 | Discharge: 2017-09-20 | Disposition: A | Discharge status: Home / Self Care | Payer: Medicare Other | Source: Ambulatory Visit | Attending: Urology | Admitting: Urology

## 2017-09-20 DIAGNOSIS — Q6239 Other obstructive defects of renal pelvis and ureter: Secondary | ICD-10-CM | POA: Diagnosis not present

## 2017-09-20 DIAGNOSIS — N2889 Other specified disorders of kidney and ureter: Secondary | ICD-10-CM | POA: Diagnosis not present

## 2017-09-20 MED ORDER — FUROSEMIDE 10 MG/ML IJ SOLN
44.0000 mg | Freq: Once | INTRAMUSCULAR | Status: AC
  Administered 2017-09-20: 44 mg via INTRAVENOUS

## 2017-09-20 MED ORDER — FUROSEMIDE 10 MG/ML IJ SOLN
INTRAMUSCULAR | Status: AC
  Filled 2017-09-20: qty 8

## 2017-09-20 MED ORDER — TECHNETIUM TC 99M MERTIATIDE
5.1000 | Freq: Once | INTRAVENOUS | Status: AC | PRN
  Administered 2017-09-20: 5.1 via INTRAVENOUS

## 2017-09-21 ENCOUNTER — Non-Acute Institutional Stay (SKILLED_NURSING_FACILITY): Payer: Medicare Other | Admitting: Internal Medicine

## 2017-09-21 ENCOUNTER — Encounter: Payer: Self-pay | Admitting: Internal Medicine

## 2017-09-21 DIAGNOSIS — N135 Crossing vessel and stricture of ureter without hydronephrosis: Secondary | ICD-10-CM

## 2017-09-21 DIAGNOSIS — F319 Bipolar disorder, unspecified: Secondary | ICD-10-CM | POA: Diagnosis not present

## 2017-09-21 DIAGNOSIS — B9629 Other Escherichia coli [E. coli] as the cause of diseases classified elsewhere: Secondary | ICD-10-CM | POA: Diagnosis not present

## 2017-09-21 DIAGNOSIS — Z1612 Extended spectrum beta lactamase (ESBL) resistance: Secondary | ICD-10-CM | POA: Diagnosis not present

## 2017-09-21 DIAGNOSIS — N390 Urinary tract infection, site not specified: Secondary | ICD-10-CM | POA: Diagnosis not present

## 2017-09-21 DIAGNOSIS — J449 Chronic obstructive pulmonary disease, unspecified: Secondary | ICD-10-CM | POA: Diagnosis not present

## 2017-09-21 NOTE — Progress Notes (Signed)
Location:   [place] Room Number: 159/P Place of Service:  SNF (31)  Provider: Veleta Miners  PCP: Salvadore Dom, MD Patient Care Team: Salvadore Dom, MD as PCP - General (Obstetrics and Gynecology)  Extended Emergency Contact Information Primary Emergency Contact: [facility] Address: [place],  [place] Home Phone: [phone] Relation: Sister  Code Status: Full Code Goals of care:  Advanced Directive information Advanced Directives 09/21/2017  Does Patient Have a Medical Advance Directive? Yes  Type of Advance Directive (No Data)  Does patient want to make changes to medical advance directive? No - Patient declined  Would patient like information on creating a medical advance directive? No - Patient declined     Allergies  Allergen Reactions  . Penicillins Hives, Rash and Other (See Comments)    Not effective per patient. Pt does not know. She states it was when she was a child. Unknown    Chief Complaint  Patient presents with  . Discharge Note    Patient being seen for Discharge Visit    HPI:  67 y.o. female  Seen today for Discharge from the Facility to ALF where she was before her Hospitalization.  Patient has h/o Bipolardisorder, COPD, Hypothyroidism,,tremors, Pneumonia with respiratory failure, CHF with EF of 50% in 03/19  Patient was initially admitted to the SNF for Therapy and IV antibiotics after staying int he hospitalfrom 04/20-04/24 for Pyelonephritis.  Her Urine Culture was positive for E Coli resistant to Most of the Antibiotics and she was  on IV Meropenem for 7 Days. But she continued to spike temp in the facility after finishing the Course. And was readmitted to the  Hospital. This time she was admitted from 05/07-05/13   She was taken to OR  and underwent Cystoscopy with leftretrograde pyelogram,left ureteral catheterization with aspiration of 350 cc of pus for culture,and  leftureteral stent placement.  Post operatively she remained on vent and was admitted to ICU. She was extubated on 05/08.  Her E Coli was sensitive for only IV antibiotics so she was send back to SNF With PICC line and eventual discharge to her ALF.after finishing her antibiotics. Patient finished her Antibiotics today and PICC line was pulled. She continues to be  Afebrile with no Abdominal Pain or Sweats. She has appointment with Urology tomorrow. She is walking in the facilty with no assist.  Past Medical History:  Diagnosis Date  . Acute pyelonephritis   . Anxiety   . Bipolar disorder, unspecified ([place])    From [facility] form  . Chronic respiratory failure with hypoxia ([place])   . COPD (chronic obstructive pulmonary disease) ([place])   . Dermatitis   . Dermatitis   . Dysrhythmia   . Essential tremor   . Fever   . History of ESBL E. coli infection   . Hyperlipidemia   . Hypothyroidism   . Thrombocytopenia ([place])   . Unspecified hydronephrosis   . Urinary incontinence     Past Surgical History:  Procedure Laterality Date  . CYSTOSCOPY W/ URETERAL STENT PLACEMENT Left 09/07/2017   Procedure: CYSTOSCOPY WITH RETROGRADE PYELOGRAM/URETERAL STENT PLACEMENT;  Surgeon: Lucas Mallow, MD;  Location: [facility];  Service: Urology;  Laterality: Left;      reports that she has been smoking.  She has never used smokeless tobacco. She reports that she drank alcohol. She reports that she has current or past drug history. Social History  Socioeconomic History  . Marital status: Single    Spouse name: Not on file  . Number of children: Not on file  . Years of education: Not on file  . Highest education level: Not on file  Occupational History  . Not on file  Social Needs  . Financial resource strain: Not on file  . Food insecurity:    Worry: Not on file    Inability: Not on file  . Transportation needs:    Medical: Not on file    Non-medical: Not on file  Tobacco Use  . Smoking  status: Current Some Day Smoker  . Smokeless tobacco: Never Used  Substance and Sexual Activity  . Alcohol use: Not Currently  . Drug use: Not Currently  . Sexual activity: Not Currently  Lifestyle  . Physical activity:    Days per week: Not on file    Minutes per session: Not on file  . Stress: Not on file  Relationships  . Social connections:    Talks on phone: Not on file    Gets together: Not on file    Attends religious service: Not on file    Active member of club or organization: Not on file    Attends meetings of clubs or organizations: Not on file    Relationship status: Not on file  . Intimate partner violence:    Fear of current or ex partner: Not on file    Emotionally abused: Not on file    Physically abused: Not on file    Forced sexual activity: Not on file  Other Topics Concern  . Not on file  Social History Narrative  . Not on file   Functional Status Survey:    Allergies  Allergen Reactions  . Penicillins Hives, Rash and Other (See Comments)    Not effective per patient. Pt does not know. She states it was when she was a child. Unknown    Pertinent  Health Maintenance Due  Topic Date Due  . DEXA SCAN  09/29/2017 (Originally 03/10/2016)  . COLONOSCOPY  09/29/2017 (Originally 03/10/2001)  . PNA vac Low Risk Adult (1 of 2 - PCV13) 09/29/2017 (Originally 03/10/2016)  . INFLUENZA VACCINE  12/02/2017  . MAMMOGRAM  09/01/2018    Medications: Outpatient Encounter Medications as of 09/21/2017  Medication Sig  . acetaminophen (TYLENOL) 325 MG tablet Take 650 mg by mouth every 6 (six) hours as needed (for temperatures greater than 101. Obtain blood cultures twice for any temperature greater than 101 and notify provider, per [facility]).   Marland Kitchen albuterol (PROVENTIL HFA;VENTOLIN HFA) 108 (90 Base) MCG/ACT inhaler Inhale 1 puff into the lungs every 6 (six) hours as needed for wheezing or shortness of breath.   Marland Kitchen aspirin 81 MG chewable tablet Chew 81 mg by mouth every  morning.   Marland Kitchen atorvastatin (LIPITOR) 40 MG tablet Take 40 mg by mouth at bedtime.   . bisacodyl (DULCOLAX) 10 MG suppository Place 1 suppository (10 mg total) rectally daily as needed for moderate constipation.  . carvedilol (COREG) 3.125 MG tablet Take 3.125 mg by mouth 2 (two) times daily with a meal.  . clonazePAM (KLONOPIN) 0.5 MG tablet Take 1 tablet (0.5 mg total) by mouth 2 (two) times daily.  . clotrimazole (GYNE-LOTRIMIN) 1 % vaginal cream Place 1 Applicatorful vaginally at bedtime. For 7 days form 09/15/2017-09/21/2017  . divalproex (DEPAKOTE) 250 MG DR tablet Take 250 mg by mouth 3 (three) times daily. Do not crush  . docusate sodium (COLACE) 100  MG capsule Take 100 mg by mouth daily as needed for mild constipation.   . ertapenem (INVANZ) IVPB Inject 1 g into the vein daily for 8 days. Indication:  E. Coli pyelonephritis Last Day of Therapy:  09/21/17 Labs - Once weekly:  CBC/D and BMP, Labs - Every other week:  ESR and CRP  . escitalopram (LEXAPRO) 10 MG tablet Take 10 mg by mouth every morning.   . gabapentin (NEURONTIN) 300 MG capsule Take 300 mg by mouth 4 (four) times daily.  Marland Kitchen ipratropium-albuterol (DUONEB) 0.5-2.5 (3) MG/3ML SOLN Take 3 mLs by nebulization every 6 (six) hours as needed (Wheezing/Shortness of Breath).   Marland Kitchen levothyroxine (SYNTHROID, LEVOTHROID) 125 MCG tablet Take 125 mcg by mouth daily before breakfast.  . loratadine (CLARITIN) 10 MG tablet Take 10 mg by mouth every morning.   . Multiple Vitamin (MULTIVITAMIN) tablet Take 1 tablet by mouth every morning.   Marland Kitchen oxyCODONE-acetaminophen (PERCOCET/ROXICET) 5-325 MG tablet Take 1 tablet by mouth every 4 (four) hours as needed for moderate pain.  . polyethylene glycol (MIRALAX / GLYCOLAX) packet Take 17 g by mouth daily as needed.  . primidone (MYSOLINE) 50 MG tablet Take 50 mg by mouth at bedtime.   . senna-docusate (SENOKOT-S) 8.6-50 MG tablet Take 1 tablet by mouth at bedtime.   No facility-administered encounter  medications on file as of 09/21/2017.      Review of Systems  Review of Systems  Constitutional: Negative for activity change, appetite change, chills, diaphoresis, fatigue and fever.  HENT: Negative for mouth sores, postnasal drip, rhinorrhea, sinus pain and sore throat.   Respiratory: Negative for apnea, cough, chest tightness, shortness of breath and wheezing.   Cardiovascular: Negative for chest pain, palpitations and leg swelling.  Gastrointestinal: Negative for abdominal distention, abdominal pain, constipation, diarrhea, nausea and vomiting.  Genitourinary: Negative for dysuria and frequency.  Musculoskeletal: Negative for arthralgias, joint swelling and myalgias.  Skin: Negative for rash.  Neurological: Negative for dizziness, syncope, weakness, light-headedness and numbness.  Psychiatric/Behavioral: Negative for behavioral problems, confusion and sleep disturbance.     Vitals:   09/21/17 1209  BP: 111/66  Pulse: 79  Resp: 20  Temp: (!) 97.1 F (36.2 C)  TempSrc: Oral  SpO2: 94%   There is no height or weight on file to calculate BMI. Physical Exam  Constitutional: She is oriented to person, place, and time. She appears well-developed and well-nourished.  HENT:  Head: Normocephalic.  Mouth/Throat: Oropharynx is clear and moist.  Eyes: Pupils are equal, round, and reactive to light. EOM are normal.  Neck: Normal range of motion. Neck supple.  Cardiovascular: Normal rate and regular rhythm.  No murmur heard. Pulmonary/Chest: Effort normal and breath sounds normal. No stridor. No respiratory distress. She has no wheezes.  Abdominal: Soft. Bowel sounds are normal. She exhibits no distension. There is no tenderness.  Musculoskeletal: She exhibits no edema.  Neurological: She is alert and oriented to person, place, and time.  Skin: Skin is warm and dry.  Psychiatric: She has a normal mood and affect. Her behavior is normal. Thought content normal.    Labs  reviewed: Basic Metabolic Panel: Recent Labs    09/11/17 0348 09/12/17 0549 09/13/17 0449 09/14/17 0400  NA 143 143 141 139  K 4.0 4.4 4.2 4.7  CL 106 104 105 104  CO2 _0 GLUCOSE 158* 158* 140* 139*  BUN _1 CREATININE 0.72 0.83 0.78 0.77  CALCIUM 8.6* 8.9 8.8* 8.9  MG 1.8 1.9 2.0  --   PHOS 3.9 4.2 4.0  --    Liver Function Tests: Recent Labs    09/11/17 0348 09/12/17 0549 09/13/17 0449  AST 13* 20 24  ALT _0 ALKPHOS 47 47 48  BILITOT 0.6 0.7 0.8  PROT 6.2* 6.5 6.4*  ALBUMIN 2.6* 2.7* 2.9*   Recent Labs    08/21/17 1713  LIPASE 48   Recent Labs    08/05/17 2352  AMMONIA 40*   CBC: Recent Labs    09/12/17 0549 09/13/17 0449 09/14/17 0400  WBC 4.0 4.5 5.6  NEUTROABS 1.7 1.8 2.2  HGB 9.6* 9.6* 10.3*  HCT 30.4* 30.0* 33.0*  MCV 93.0 92.3 93.2  PLT 202 196 211   Cardiac Enzymes: Recent Labs    08/21/17 1713 09/07/17 1925  TROPONINI <0.03 <0.03   BNP: Invalid input(s): POCBNP CBG: Recent Labs    09/08/17 0321 09/08/17 1953  GLUCAP 221* 240*    Procedures and Imaging Studies During Stay: Nm Renal Imaging Flow W/pharm  Result Date: 09/20/2017 CLINICAL DATA:  Acute pyelonephritis. Patient has left ureteral stent. EXAM: NUCLEAR MEDICINE RENAL SCAN WITH DIURETIC ADMINISTRATION TECHNIQUE: Radionuclide angiographic and sequential renal images were obtained after intravenous injection of radiopharmaceutical. Imaging was continued during slow intravenous injection of Lasix approximately 15 minutes after the start of the examination. RADIOPHARMACEUTICALS:  5.1 mCi Technetium-75mMAG3 IV COMPARISON:  None. FINDINGS: Flow: There is prompt flow in the right kidney. There is markedly diminished flow in the left kidney but the small amount of remaining flow does appear to be prompt. Left renogram: There is very poor cortical uptake on the left. There is delayed time to excretion on the left. The left renal pelvis is dilated without  significant washout after Lasix. Right renogram: There is normal time to maximum cortical uptake, excretion, and washout on the right. Differential: Left kidney = 4 % Right kidney = 96 % T1/2 post Lasix : Left kidney = cannot be calculated.  No washout after Lasix. Right kidney = 10.6 min IMPRESSION: 1. The right kidney is normal. 2. There is poor function in the left kidney limiting evaluation. There is markedly diminished vascular flow, uptake, and delayed excretion into a dilated left renal collecting system. No washout after Lasix. The markedly diminished function on the left would make it difficult to evaluate for ongoing obstruction but the patient does have a stent in place. Electronically Signed   By: DDorise BullionIII M.D   On: 09/20/2017 13:30   Dg Chest Port 1 View  Result Date: 09/08/2017 CLINICAL DATA:  67year old female with endotracheal tube. Subsequent encounter. EXAM: PORTABLE CHEST 1 VIEW COMPARISON:  09/08/2017 1:02 a.m. FINDINGS: Endotracheal tube tip 1.3 cm above the carina. Slight decrease in degree of pulmonary vascular congestion. Cardiomegaly and prominent mediastinum unchanged. Slightly elevated right hemidiaphragm. No acute osseous abnormality. IMPRESSION: Slight decrease in degree of pulmonary vascular congestion. Cardiomegaly and prominent mediastinum unchanged. Endotracheal tube tip 1.3 cm above the carina. Electronically Signed   By: SGenia DelM.D.   On: 09/08/2017 06:56   Dg Chest Port 1 View  Result Date: 09/08/2017 CLINICAL DATA:  Initial evaluation for endotracheal tube placement. Respiratory failure. EXAM: PORTABLE CHEST 1 VIEW COMPARISON:  Prior radiograph from 09/07/2017. FINDINGS: Endotracheal tube in place with tip positioned approximately 11 mm above the carina. Cardiomegaly, stable. Mediastinal silhouette within normal limits. Lungs are hypoinflated. Diffuse vascular congestion with bronchovascular crowding and bibasilar atelectatic changes. No definite new  focal infiltrates. No pneumothorax. No acute osseous abnormality. IMPRESSION: 1. Tip of the endotracheal tube positioned 11 mm above the carina. 2. Low lung volumes with secondary bibasilar atelectasis/bronchovascular crowding. 3. Stable cardiomegaly with diffuse pulmonary vascular congestion without overt edema. Electronically Signed   By: Jeannine Boga M.D.   On: 09/08/2017 02:31   Dg Chest Port 1 View  Result Date: 09/07/2017 CLINICAL DATA:  67 year old female with fever for 2 days.  Hypoxia. EXAM: PORTABLE CHEST 1 VIEW COMPARISON:  Portable chest 08/25/2017 and earlier. FINDINGS: Portable AP upright view at 1905 hours. Lower lung volumes and persistent lordotic positioning. Stable mild cardiomegaly. Other mediastinal contours are within normal limits. Visualized tracheal air column is within normal limits. Allowing for portable technique the lungs are clear. Paucity of bowel gas in the upper abdomen. No acute osseous abnormality identified. IMPRESSION: Low lung volumes.  No acute cardiopulmonary abnormality. Electronically Signed   By: Genevie Ann M.D.   On: 09/07/2017 19:38   Dg Chest Port 1 View  Result Date: 08/25/2017 CLINICAL DATA:  67 y/o  F; status post right PICC line placement. EXAM: PORTABLE CHEST 1 VIEW COMPARISON:  08/21/2017 chest radiograph FINDINGS: Right PICC line tip projects over lower SVC. Stable cardiomegaly. No consolidation, effusion, or pneumothorax. Bones are unremarkable. IMPRESSION: Right PICC line tip projects over lower SVC. Electronically Signed   By: Kristine Garbe M.D.   On: 08/25/2017 15:08   Dg C-arm 1-60 Min-no Report  Result Date: 09/07/2017 Fluoroscopy was utilized by the requesting physician.  No radiographic interpretation.   [place] Ekg Site Rite  Result Date: 09/11/2017 If Site Rite image not attached, placement could not be confirmed due to current cardiac rhythm.   Assessment/Plan:   Multidrug-resistant E. Coli pyelonephritis with Left UPJ  obstrutionS/P left urethral Stent placement Patient Dustin Flock today 05/21 She is afebrile Patient has Follow up appointment with urology Blood Cultures were negative. Initial positive Blood cultures  were thought ot be due to Contamination.  Respiratory Failure Requiring ventilation On Room air now. Continue Bronchodilators. Chronic thrombocytopenia Platelets stable. History of CHF Continue on Coreg Not on any diuretic Continue with aspirin COPD  continue bronchodilators Not on any Oxygen now. Hyperlipidemia Continue atorvastatin Bipolar disorder Onvalproic acid and Lexapro and Klonipin Hand Tremors On Primidone Hypothyroid Continue levothyroxine TSH was elevated. Dose is not changed . Recommend Follow up in 4 weeks as not sure if patient was taking it properly at home.  Patient is discharged to ALF. She will be followed by her PCP in facility. She will also Need follow up with her Urologist.   Future labs/tests needed:  TSH in 6 weeks  D/W the Patient  Discharge More then 30 min

## 2017-09-24 DIAGNOSIS — J9611 Chronic respiratory failure with hypoxia: Secondary | ICD-10-CM | POA: Diagnosis not present

## 2017-09-24 DIAGNOSIS — I509 Heart failure, unspecified: Secondary | ICD-10-CM | POA: Diagnosis not present

## 2017-09-24 DIAGNOSIS — F319 Bipolar disorder, unspecified: Secondary | ICD-10-CM | POA: Diagnosis not present

## 2017-09-24 DIAGNOSIS — G250 Essential tremor: Secondary | ICD-10-CM | POA: Diagnosis not present

## 2017-09-24 DIAGNOSIS — N10 Acute tubulo-interstitial nephritis: Secondary | ICD-10-CM | POA: Diagnosis not present

## 2017-09-24 DIAGNOSIS — J449 Chronic obstructive pulmonary disease, unspecified: Secondary | ICD-10-CM | POA: Diagnosis not present

## 2017-09-24 DIAGNOSIS — B9620 Unspecified Escherichia coli [E. coli] as the cause of diseases classified elsewhere: Secondary | ICD-10-CM | POA: Diagnosis not present

## 2017-09-24 DIAGNOSIS — F419 Anxiety disorder, unspecified: Secondary | ICD-10-CM | POA: Diagnosis not present

## 2017-10-01 ENCOUNTER — Other Ambulatory Visit: Payer: Self-pay | Admitting: Urology

## 2017-10-01 ENCOUNTER — Encounter (HOSPITAL_COMMUNITY)
Admission: RE | Admit: 2017-10-01 | Discharge: 2017-10-01 | Disposition: A | Discharge status: Home / Self Care | Payer: Medicare Other | Source: Skilled Nursing Facility | Attending: Internal Medicine | Admitting: Internal Medicine

## 2017-10-01 DIAGNOSIS — R279 Unspecified lack of coordination: Secondary | ICD-10-CM | POA: Insufficient documentation

## 2017-10-01 DIAGNOSIS — N10 Acute tubulo-interstitial nephritis: Secondary | ICD-10-CM | POA: Insufficient documentation

## 2017-10-01 DIAGNOSIS — R262 Difficulty in walking, not elsewhere classified: Secondary | ICD-10-CM | POA: Insufficient documentation

## 2017-10-01 DIAGNOSIS — R32 Unspecified urinary incontinence: Secondary | ICD-10-CM | POA: Insufficient documentation

## 2017-10-01 DIAGNOSIS — N1330 Unspecified hydronephrosis: Secondary | ICD-10-CM | POA: Insufficient documentation

## 2017-10-01 DIAGNOSIS — Z978 Presence of other specified devices: Secondary | ICD-10-CM | POA: Insufficient documentation

## 2017-10-25 NOTE — Progress Notes (Signed)
EKG 09-07-17 Epic   CARDIAC CATH 07-15-16 Epic CARE EVERYWHERE    LOV CARDIOLOGY 06-25-16 Epic CARE EVERYWHERE   CXR 09-08-17 Epic

## 2017-10-25 NOTE — Patient Instructions (Signed)
Laurie Moreno  10/25/2017   Your procedure is scheduled on: 11-01-17   Report to [facility] Main  Entrance    Report to admitting at 5:30AM    Call this number if you have problems the morning of surgery [phone]     Remember: Do not eat food or drink liquids :After Midnight.     Take these medicines the morning of surgery with A SIP OF WATER: CARVEDILOL, DEPAKOTE, ESCITALOPRAM, LEVOTHYROXINE, GABAPENTIN, CLONAZEPAM, ALBUTEROL INHALER IF NEEDED (PLEASE BRING)                                 You may not have any metal on your body including hair pins and              piercings  Do not wear jewelry, make-up, lotions, powders or perfumes, deodorant             Do not wear nail polish.  Do not shave  48 hours prior to surgery.     Do not bring valuables to the hospital. [place].  Contacts, dentures or bridgework may not be worn into surgery.  Leave suitcase in the car. After surgery it may be brought to your room.                Please read over the following fact sheets you were given: _____________________________________________________________________             [facility] - Preparing for Surgery Before surgery, you can play an important role.  Because skin is not sterile, your skin needs to be as free of germs as possible.  You can reduce the number of germs on your skin by washing with CHG (chlorahexidine gluconate) soap before surgery.  CHG is an antiseptic cleaner which kills germs and bonds with the skin to continue killing germs even after washing. Please DO NOT use if you have an allergy to CHG or antibacterial soaps.  If your skin becomes reddened/irritated stop using the CHG and inform your nurse when you arrive at Short Stay. Do not shave (including legs and underarms) for at least 48 hours prior to the first CHG shower.  You may shave your face/neck. Please follow these instructions  carefully:  1.  Shower with CHG Soap the night before surgery and the  morning of Surgery.  2.  If you choose to wash your hair, wash your hair first as usual with your  normal  shampoo.  3.  After you shampoo, rinse your hair and body thoroughly to remove the  shampoo.                           4.  Use CHG as you would any other liquid soap.  You can apply chg directly  to the skin and wash                       Gently with a scrungie or clean washcloth.  5.  Apply the CHG Soap to your body ONLY FROM THE NECK DOWN.   Do not use on face/ open  Wound or open sores. Avoid contact with eyes, ears mouth and genitals (private parts).                       Wash face,  Genitals (private parts) with your normal soap.             6.  Wash thoroughly, paying special attention to the area where your surgery  will be performed.  7.  Thoroughly rinse your body with warm water from the neck down.  8.  DO NOT shower/wash with your normal soap after using and rinsing off  the CHG Soap.                9.  Pat yourself dry with a clean towel.            10.  Wear clean pajamas.            11.  Place clean sheets on your bed the night of your first shower and do not  sleep with pets. Day of Surgery : Do not apply any lotions/deodorants the morning of surgery.  Please wear clean clothes to the hospital/surgery center.  FAILURE TO FOLLOW THESE INSTRUCTIONS MAY RESULT IN THE CANCELLATION OF YOUR SURGERY PATIENT SIGNATURE_________________________________  NURSE SIGNATURE__________________________________  ________________________________________________________________________  WHAT IS A BLOOD TRANSFUSION? Blood Transfusion Information  A transfusion is the replacement of blood or some of its parts. Blood is made up of multiple cells which provide different functions.  Red blood cells carry oxygen and are used for blood loss replacement.  White blood cells fight against  infection.  Platelets control bleeding.  Plasma helps clot blood.  Other blood products are available for specialized needs, such as hemophilia or other clotting disorders. BEFORE THE TRANSFUSION  Who gives blood for transfusions?   Healthy volunteers who are fully evaluated to make sure their blood is safe. This is blood bank blood. Transfusion therapy is the safest it has ever been in the practice of medicine. Before blood is taken from a donor, a complete history is taken to make sure that person has no history of diseases nor engages in risky social behavior (examples are intravenous drug use or sexual activity with multiple partners). The donor's travel history is screened to minimize risk of transmitting infections, such as malaria. The donated blood is tested for signs of infectious diseases, such as HIV and hepatitis. The blood is then tested to be sure it is compatible with you in order to minimize the chance of a transfusion reaction. If you or a relative donates blood, this is often done in anticipation of surgery and is not appropriate for emergency situations. It takes many days to process the donated blood. RISKS AND COMPLICATIONS Although transfusion therapy is very safe and saves many lives, the main dangers of transfusion include:   Getting an infectious disease.  Developing a transfusion reaction. This is an allergic reaction to something in the blood you were given. Every precaution is taken to prevent this. The decision to have a blood transfusion has been considered carefully by your caregiver before blood is given. Blood is not given unless the benefits outweigh the risks. AFTER THE TRANSFUSION  Right after receiving a blood transfusion, you will usually feel much better and more energetic. This is especially true if your red blood cells have gotten low (anemic). The transfusion raises the level of the red blood cells which carry oxygen, and this usually causes an energy  increase.  The nurse administering the transfusion will monitor you carefully for complications. HOME CARE INSTRUCTIONS  No special instructions are needed after a transfusion. You may find your energy is better. Speak with your caregiver about any limitations on activity for underlying diseases you may have. SEEK MEDICAL CARE IF:   Your condition is not improving after your transfusion.  You develop redness or irritation at the intravenous (IV) site. SEEK IMMEDIATE MEDICAL CARE IF:  Any of the following symptoms occur over the next 12 hours:  Shaking chills.  You have a temperature by mouth above 102 F (38.9 C), not controlled by medicine.  Chest, back, or muscle pain.  People around you feel you are not acting correctly or are confused.  Shortness of breath or difficulty breathing.  Dizziness and fainting.  You get a rash or develop hives.  You have a decrease in urine output.  Your urine turns a dark color or changes to pink, red, or brown. Any of the following symptoms occur over the next 10 days:  You have a temperature by mouth above 102 F (38.9 C), not controlled by medicine.  Shortness of breath.  Weakness after normal activity.  The white part of the eye turns yellow (jaundice).  You have a decrease in the amount of urine or are urinating less often.  Your urine turns a dark color or changes to pink, red, or brown. Document Released: 04/17/2000 Document Revised: 07/13/2011 Document Reviewed: 12/05/2007 [facility] Patient Information 2014 [place], [place].  _______________________________________________________________________

## 2017-10-26 ENCOUNTER — Other Ambulatory Visit: Payer: Self-pay

## 2017-10-26 ENCOUNTER — Encounter (HOSPITAL_COMMUNITY): Payer: Self-pay

## 2017-10-26 ENCOUNTER — Encounter (HOSPITAL_COMMUNITY)
Admission: RE | Admit: 2017-10-26 | Discharge: 2017-10-26 | Disposition: A | Discharge status: Home / Self Care | Payer: Medicare Other | Source: Ambulatory Visit | Attending: Urology | Admitting: Urology

## 2017-10-26 DIAGNOSIS — E039 Hypothyroidism, unspecified: Secondary | ICD-10-CM | POA: Diagnosis not present

## 2017-10-26 DIAGNOSIS — I509 Heart failure, unspecified: Secondary | ICD-10-CM | POA: Diagnosis not present

## 2017-10-26 DIAGNOSIS — R251 Tremor, unspecified: Secondary | ICD-10-CM | POA: Diagnosis not present

## 2017-10-26 DIAGNOSIS — Z01812 Encounter for preprocedural laboratory examination: Secondary | ICD-10-CM | POA: Insufficient documentation

## 2017-10-26 DIAGNOSIS — D696 Thrombocytopenia, unspecified: Secondary | ICD-10-CM | POA: Insufficient documentation

## 2017-10-26 DIAGNOSIS — J449 Chronic obstructive pulmonary disease, unspecified: Secondary | ICD-10-CM | POA: Insufficient documentation

## 2017-10-26 DIAGNOSIS — R509 Fever, unspecified: Secondary | ICD-10-CM

## 2017-10-26 HISTORY — DX: Family history of other specified conditions: Z84.89

## 2017-10-26 HISTORY — DX: Fever, unspecified: R50.9

## 2017-10-26 HISTORY — DX: Other complications of anesthesia, initial encounter: T88.59XA

## 2017-10-26 HISTORY — DX: Heart failure, unspecified: I50.9

## 2017-10-26 HISTORY — DX: Adverse effect of unspecified anesthetic, initial encounter: T41.45XA

## 2017-10-26 LAB — BASIC METABOLIC PANEL
ANION GAP: 8 (ref 5–15)
BUN: 19 mg/dL (ref 8–23)
CO2: 28 mmol/L (ref 22–32)
Calcium: 9.4 mg/dL (ref 8.9–10.3)
Chloride: 107 mmol/L (ref 98–111)
Creatinine, Ser: 0.75 mg/dL (ref 0.44–1.00)
GFR calc Af Amer: >60 mL/min (ref >60)
Glucose, Bld: 96 mg/dL (ref 70–99)
POTASSIUM: 4.8 mmol/L (ref 3.5–5.1)
SODIUM: 143 mmol/L (ref 135–145)

## 2017-10-26 LAB — CBC
HEMATOCRIT: 42.6 % (ref 36.0–46.0)
HEMOGLOBIN: 13.5 g/dL (ref 12.0–15.0)
MCH: 30 pg (ref 26.0–34.0)
MCHC: 31.7 g/dL (ref 30.0–36.0)
MCV: 94.7 fL (ref 78.0–100.0)
Platelets: 137 10*3/uL — ABNORMAL LOW (ref 150–400)
RBC: 4.5 MIL/uL (ref 3.87–5.11)
RDW: 14.1 % (ref 11.5–15.5)
WBC: 5.6 10*3/uL (ref 4.0–10.5)

## 2017-10-26 LAB — PROTIME-INR
INR: 1.08
PROTHROMBIN TIME: 14 s (ref 11.4–15.2)

## 2017-10-26 LAB — ABO/RH: ABO/RH(D): O POS

## 2017-11-01 ENCOUNTER — Inpatient Hospital Stay (HOSPITAL_COMMUNITY): Payer: Medicare Other | Admitting: Certified Registered Nurse Anesthetist

## 2017-11-01 ENCOUNTER — Encounter (HOSPITAL_COMMUNITY)
Admission: RE | Disposition: A | Discharge status: Skilled Nursing Facility | Payer: Self-pay | Source: Home / Self Care | Attending: Urology

## 2017-11-01 ENCOUNTER — Inpatient Hospital Stay (HOSPITAL_COMMUNITY)
Admission: RE | Admit: 2017-11-01 | Discharge: 2017-11-09 | DRG: 659 | Disposition: A | Discharge status: Skilled Nursing Facility | Payer: Medicare Other | Attending: Urology | Admitting: Urology

## 2017-11-01 ENCOUNTER — Encounter (HOSPITAL_COMMUNITY): Payer: Self-pay

## 2017-11-01 ENCOUNTER — Other Ambulatory Visit: Payer: Self-pay

## 2017-11-01 DIAGNOSIS — I5033 Acute on chronic diastolic (congestive) heart failure: Secondary | ICD-10-CM | POA: Diagnosis present

## 2017-11-01 DIAGNOSIS — Z8619 Personal history of other infectious and parasitic diseases: Secondary | ICD-10-CM | POA: Diagnosis not present

## 2017-11-01 DIAGNOSIS — R0602 Shortness of breath: Secondary | ICD-10-CM

## 2017-11-01 DIAGNOSIS — Z880 Allergy status to penicillin: Secondary | ICD-10-CM | POA: Diagnosis not present

## 2017-11-01 DIAGNOSIS — Z87442 Personal history of urinary calculi: Secondary | ICD-10-CM

## 2017-11-01 DIAGNOSIS — Y95 Nosocomial condition: Secondary | ICD-10-CM | POA: Diagnosis present

## 2017-11-01 DIAGNOSIS — Z79899 Other long term (current) drug therapy: Secondary | ICD-10-CM | POA: Diagnosis not present

## 2017-11-01 DIAGNOSIS — D649 Anemia, unspecified: Secondary | ICD-10-CM | POA: Diagnosis present

## 2017-11-01 DIAGNOSIS — Z885 Allergy status to narcotic agent status: Secondary | ICD-10-CM

## 2017-11-01 DIAGNOSIS — J449 Chronic obstructive pulmonary disease, unspecified: Secondary | ICD-10-CM | POA: Diagnosis not present

## 2017-11-01 DIAGNOSIS — Z79890 Hormone replacement therapy (postmenopausal): Secondary | ICD-10-CM | POA: Diagnosis not present

## 2017-11-01 DIAGNOSIS — J189 Pneumonia, unspecified organism: Secondary | ICD-10-CM | POA: Diagnosis present

## 2017-11-01 DIAGNOSIS — R011 Cardiac murmur, unspecified: Secondary | ICD-10-CM | POA: Diagnosis present

## 2017-11-01 DIAGNOSIS — R1310 Dysphagia, unspecified: Secondary | ICD-10-CM | POA: Diagnosis present

## 2017-11-01 DIAGNOSIS — Z9842 Cataract extraction status, left eye: Secondary | ICD-10-CM

## 2017-11-01 DIAGNOSIS — J9601 Acute respiratory failure with hypoxia: Secondary | ICD-10-CM | POA: Diagnosis not present

## 2017-11-01 DIAGNOSIS — R0902 Hypoxemia: Secondary | ICD-10-CM

## 2017-11-01 DIAGNOSIS — Z800 Family history of malignant neoplasm of digestive organs: Secondary | ICD-10-CM | POA: Diagnosis not present

## 2017-11-01 DIAGNOSIS — J9621 Acute and chronic respiratory failure with hypoxia: Secondary | ICD-10-CM | POA: Diagnosis present

## 2017-11-01 DIAGNOSIS — F319 Bipolar disorder, unspecified: Secondary | ICD-10-CM | POA: Diagnosis present

## 2017-11-01 DIAGNOSIS — E039 Hypothyroidism, unspecified: Secondary | ICD-10-CM | POA: Diagnosis present

## 2017-11-01 DIAGNOSIS — Z808 Family history of malignant neoplasm of other organs or systems: Secondary | ICD-10-CM

## 2017-11-01 DIAGNOSIS — I252 Old myocardial infarction: Secondary | ICD-10-CM

## 2017-11-01 DIAGNOSIS — Z6838 Body mass index (BMI) 38.0-38.9, adult: Secondary | ICD-10-CM

## 2017-11-01 DIAGNOSIS — R059 Cough, unspecified: Secondary | ICD-10-CM

## 2017-11-01 DIAGNOSIS — G9349 Other encephalopathy: Secondary | ICD-10-CM | POA: Diagnosis present

## 2017-11-01 DIAGNOSIS — E785 Hyperlipidemia, unspecified: Secondary | ICD-10-CM | POA: Diagnosis present

## 2017-11-01 DIAGNOSIS — N289 Disorder of kidney and ureter, unspecified: Secondary | ICD-10-CM | POA: Diagnosis present

## 2017-11-01 DIAGNOSIS — B9620 Unspecified Escherichia coli [E. coli] as the cause of diseases classified elsewhere: Secondary | ICD-10-CM | POA: Diagnosis present

## 2017-11-01 DIAGNOSIS — N12 Tubulo-interstitial nephritis, not specified as acute or chronic: Secondary | ICD-10-CM | POA: Diagnosis not present

## 2017-11-01 DIAGNOSIS — E8771 Transfusion associated circulatory overload: Secondary | ICD-10-CM | POA: Diagnosis not present

## 2017-11-01 DIAGNOSIS — I110 Hypertensive heart disease with heart failure: Secondary | ICD-10-CM | POA: Diagnosis present

## 2017-11-01 DIAGNOSIS — Z8701 Personal history of pneumonia (recurrent): Secondary | ICD-10-CM

## 2017-11-01 DIAGNOSIS — N119 Chronic tubulo-interstitial nephritis, unspecified: Secondary | ICD-10-CM | POA: Diagnosis present

## 2017-11-01 DIAGNOSIS — F419 Anxiety disorder, unspecified: Secondary | ICD-10-CM | POA: Diagnosis present

## 2017-11-01 DIAGNOSIS — J440 Chronic obstructive pulmonary disease with acute lower respiratory infection: Secondary | ICD-10-CM | POA: Diagnosis present

## 2017-11-01 DIAGNOSIS — F17210 Nicotine dependence, cigarettes, uncomplicated: Secondary | ICD-10-CM | POA: Diagnosis present

## 2017-11-01 DIAGNOSIS — K219 Gastro-esophageal reflux disease without esophagitis: Secondary | ICD-10-CM | POA: Diagnosis present

## 2017-11-01 DIAGNOSIS — N111 Chronic obstructive pyelonephritis: Principal | ICD-10-CM | POA: Diagnosis present

## 2017-11-01 DIAGNOSIS — R05 Cough: Secondary | ICD-10-CM

## 2017-11-01 DIAGNOSIS — D696 Thrombocytopenia, unspecified: Secondary | ICD-10-CM | POA: Diagnosis present

## 2017-11-01 DIAGNOSIS — Z905 Acquired absence of kidney: Secondary | ICD-10-CM

## 2017-11-01 DIAGNOSIS — E669 Obesity, unspecified: Secondary | ICD-10-CM | POA: Diagnosis present

## 2017-11-01 DIAGNOSIS — Z9841 Cataract extraction status, right eye: Secondary | ICD-10-CM

## 2017-11-01 HISTORY — PX: LAPAROSCOPIC NEPHRECTOMY, HAND ASSISTED: SHX1929

## 2017-11-01 LAB — BASIC METABOLIC PANEL
ANION GAP: 9 (ref 5–15)
BUN: 17 mg/dL (ref 8–23)
CHLORIDE: 109 mmol/L (ref 98–111)
CO2: 24 mmol/L (ref 22–32)
Calcium: 8.2 mg/dL — ABNORMAL LOW (ref 8.9–10.3)
Creatinine, Ser: 0.67 mg/dL (ref 0.44–1.00)
GFR calc Af Amer: >60 mL/min (ref >60)
GLUCOSE: 165 mg/dL — AB (ref 70–99)
POTASSIUM: 4.9 mmol/L (ref 3.5–5.1)
Sodium: 142 mmol/L (ref 135–145)

## 2017-11-01 LAB — HEMOGLOBIN AND HEMATOCRIT, BLOOD
HCT: 39 % (ref 36.0–46.0)
HEMOGLOBIN: 12.5 g/dL (ref 12.0–15.0)

## 2017-11-01 LAB — TYPE AND SCREEN
ABO/RH(D): O POS
Antibody Screen: NEGATIVE

## 2017-11-01 SURGERY — NEPHRECTOMY, HAND-ASSISTED, LAPAROSCOPIC
Anesthesia: General | Laterality: Left

## 2017-11-01 MED ORDER — OXYCODONE HCL 5 MG PO TABS: 5.0000 mg | ORAL_TABLET | Freq: Once | ORAL | Status: DC | PRN

## 2017-11-01 MED ORDER — DIVALPROEX SODIUM 250 MG PO DR TAB
250.0000 mg | DELAYED_RELEASE_TABLET | Freq: Three times a day (TID) | ORAL | Status: DC
  Administered 2017-11-01 – 2017-11-09 (×23): 250 mg via ORAL
  Filled 2017-11-01 (×24): qty 1

## 2017-11-01 MED ORDER — ROCURONIUM BROMIDE 100 MG/10ML IV SOLN
INTRAVENOUS | Status: DC | PRN
  Administered 2017-11-01: 60 mg via INTRAVENOUS
  Administered 2017-11-01 (×2): 10 mg via INTRAVENOUS

## 2017-11-01 MED ORDER — ACETAMINOPHEN 10 MG/ML IV SOLN
1000.0000 mg | Freq: Once | INTRAVENOUS | Status: AC
  Administered 2017-11-01: 1000 mg via INTRAVENOUS

## 2017-11-01 MED ORDER — ONDANSETRON HCL 4 MG/2ML IJ SOLN
4.0000 mg | INTRAMUSCULAR | Status: DC | PRN
  Administered 2017-11-02: 4 mg via INTRAVENOUS
  Filled 2017-11-01: qty 2

## 2017-11-01 MED ORDER — GABAPENTIN 300 MG PO CAPS
300.0000 mg | ORAL_CAPSULE | Freq: Four times a day (QID) | ORAL | Status: DC
  Administered 2017-11-01 – 2017-11-09 (×29): 300 mg via ORAL
  Filled 2017-11-01 (×30): qty 1

## 2017-11-01 MED ORDER — SODIUM CHLORIDE 0.9 % IV SOLN
INTRAVENOUS | Status: DC
  Administered 2017-11-01 – 2017-11-02 (×3): via INTRAVENOUS

## 2017-11-01 MED ORDER — ATORVASTATIN CALCIUM 40 MG PO TABS
40.0000 mg | ORAL_TABLET | Freq: Every day | ORAL | Status: DC
  Administered 2017-11-01 – 2017-11-08 (×8): 40 mg via ORAL
  Filled 2017-11-01 (×8): qty 1

## 2017-11-01 MED ORDER — ACETAMINOPHEN 325 MG PO TABS
650.0000 mg | ORAL_TABLET | ORAL | Status: DC | PRN
  Administered 2017-11-01 – 2017-11-04 (×7): 650 mg via ORAL
  Filled 2017-11-01 (×7): qty 2

## 2017-11-01 MED ORDER — 0.9 % SODIUM CHLORIDE (POUR BTL) OPTIME
TOPICAL | Status: DC | PRN
  Administered 2017-11-01: 1000 mL

## 2017-11-01 MED ORDER — PROPOFOL 10 MG/ML IV BOLUS
INTRAVENOUS | Status: DC | PRN
  Administered 2017-11-01: 150 mg via INTRAVENOUS

## 2017-11-01 MED ORDER — DIPHENHYDRAMINE HCL 50 MG/ML IJ SOLN: 12.5000 mg | Freq: Four times a day (QID) | INTRAMUSCULAR | Status: DC | PRN

## 2017-11-01 MED ORDER — LACTATED RINGERS IR SOLN
Status: DC | PRN
  Administered 2017-11-01: 1000 mL

## 2017-11-01 MED ORDER — ESCITALOPRAM OXALATE 10 MG PO TABS
10.0000 mg | ORAL_TABLET | Freq: Every morning | ORAL | Status: DC
  Administered 2017-11-02 – 2017-11-09 (×8): 10 mg via ORAL
  Filled 2017-11-01 (×8): qty 1

## 2017-11-01 MED ORDER — LEVOTHYROXINE SODIUM 125 MCG PO TABS
125.0000 ug | ORAL_TABLET | Freq: Every day | ORAL | Status: DC
  Administered 2017-11-02 – 2017-11-09 (×8): 125 ug via ORAL
  Filled 2017-11-01 (×8): qty 1

## 2017-11-01 MED ORDER — SODIUM CHLORIDE 0.9 % IJ SOLN
INTRAMUSCULAR | Status: AC
  Filled 2017-11-01: qty 20

## 2017-11-01 MED ORDER — FENTANYL CITRATE (PF) 250 MCG/5ML IJ SOLN
INTRAMUSCULAR | Status: AC
  Filled 2017-11-01: qty 5

## 2017-11-01 MED ORDER — DOCUSATE SODIUM 100 MG PO CAPS
100.0000 mg | ORAL_CAPSULE | Freq: Two times a day (BID) | ORAL | Status: DC
  Administered 2017-11-01 – 2017-11-09 (×14): 100 mg via ORAL
  Filled 2017-11-01 (×16): qty 1

## 2017-11-01 MED ORDER — LIDOCAINE 2% (20 MG/ML) 5 ML SYRINGE
INTRAMUSCULAR | Status: DC | PRN
  Administered 2017-11-01: 80 mg via INTRAVENOUS

## 2017-11-01 MED ORDER — PROPOFOL 10 MG/ML IV BOLUS
INTRAVENOUS | Status: AC
  Filled 2017-11-01: qty 20

## 2017-11-01 MED ORDER — ALBUTEROL SULFATE HFA 108 (90 BASE) MCG/ACT IN AERS
INHALATION_SPRAY | RESPIRATORY_TRACT | Status: DC | PRN
  Administered 2017-11-01: 6 via RESPIRATORY_TRACT

## 2017-11-01 MED ORDER — SODIUM CHLORIDE 0.9 % IV SOLN
1.0000 g | Freq: Three times a day (TID) | INTRAVENOUS | Status: AC
  Administered 2017-11-01 – 2017-11-02 (×2): 1 g via INTRAVENOUS
  Filled 2017-11-01 (×2): qty 1

## 2017-11-01 MED ORDER — SENNA 8.6 MG PO TABS
1.0000 | ORAL_TABLET | Freq: Two times a day (BID) | ORAL | Status: DC
  Administered 2017-11-01 – 2017-11-09 (×13): 8.6 mg via ORAL
  Filled 2017-11-01 (×15): qty 1

## 2017-11-01 MED ORDER — PHENYLEPHRINE 40 MCG/ML (10ML) SYRINGE FOR IV PUSH (FOR BLOOD PRESSURE SUPPORT)
PREFILLED_SYRINGE | INTRAVENOUS | Status: AC
  Filled 2017-11-01: qty 10

## 2017-11-01 MED ORDER — OXYCODONE HCL 5 MG/5ML PO SOLN
5.0000 mg | Freq: Once | ORAL | Status: DC | PRN
  Filled 2017-11-01: qty 5

## 2017-11-01 MED ORDER — ALBUTEROL SULFATE HFA 108 (90 BASE) MCG/ACT IN AERS
INHALATION_SPRAY | RESPIRATORY_TRACT | Status: AC
  Filled 2017-11-01: qty 6.7

## 2017-11-01 MED ORDER — DIPHENHYDRAMINE HCL 12.5 MG/5ML PO ELIX
12.5000 mg | ORAL_SOLUTION | Freq: Four times a day (QID) | ORAL | Status: DC | PRN
  Administered 2017-11-05 – 2017-11-06 (×2): 12.5 mg via ORAL
  Filled 2017-11-01 (×2): qty 5

## 2017-11-01 MED ORDER — LACTATED RINGERS IV SOLN
INTRAVENOUS | Status: DC | PRN
  Administered 2017-11-01 (×2): via INTRAVENOUS

## 2017-11-01 MED ORDER — SODIUM CHLORIDE 0.9% FLUSH
INTRAVENOUS | Status: DC | PRN
  Administered 2017-11-01: 20 mL

## 2017-11-01 MED ORDER — SUGAMMADEX SODIUM 500 MG/5ML IV SOLN
INTRAVENOUS | Status: DC | PRN
  Administered 2017-11-01: 300 mg via INTRAVENOUS

## 2017-11-01 MED ORDER — PRIMIDONE 50 MG PO TABS
50.0000 mg | ORAL_TABLET | Freq: Every day | ORAL | Status: DC
  Administered 2017-11-01 – 2017-11-08 (×8): 50 mg via ORAL
  Filled 2017-11-01 (×9): qty 1

## 2017-11-01 MED ORDER — OXYCODONE HCL 5 MG PO TABS
5.0000 mg | ORAL_TABLET | ORAL | Status: DC | PRN
  Administered 2017-11-01 – 2017-11-03 (×3): 5 mg via ORAL
  Filled 2017-11-01 (×3): qty 1

## 2017-11-01 MED ORDER — SODIUM CHLORIDE 0.9 % IV SOLN
1.0000 g | INTRAVENOUS | Status: AC
  Administered 2017-11-01: 1 g via INTRAVENOUS
  Filled 2017-11-01: qty 1

## 2017-11-01 MED ORDER — IPRATROPIUM-ALBUTEROL 0.5-2.5 (3) MG/3ML IN SOLN: 3.0000 mL | Freq: Four times a day (QID) | RESPIRATORY_TRACT | Status: DC | PRN

## 2017-11-01 MED ORDER — CARVEDILOL 3.125 MG PO TABS
3.1250 mg | ORAL_TABLET | Freq: Two times a day (BID) | ORAL | Status: DC
  Administered 2017-11-01 – 2017-11-09 (×16): 3.125 mg via ORAL
  Filled 2017-11-01 (×16): qty 1

## 2017-11-01 MED ORDER — SUGAMMADEX SODIUM 500 MG/5ML IV SOLN
INTRAVENOUS | Status: AC
  Filled 2017-11-01: qty 5

## 2017-11-01 MED ORDER — PROMETHAZINE HCL 25 MG/ML IJ SOLN: 6.2500 mg | INTRAMUSCULAR | Status: DC | PRN

## 2017-11-01 MED ORDER — ACETAMINOPHEN 10 MG/ML IV SOLN
INTRAVENOUS | Status: AC
  Filled 2017-11-01: qty 100

## 2017-11-01 MED ORDER — CLONAZEPAM 0.5 MG PO TABS
0.5000 mg | ORAL_TABLET | Freq: Two times a day (BID) | ORAL | Status: DC
  Administered 2017-11-01 – 2017-11-09 (×16): 0.5 mg via ORAL
  Filled 2017-11-01 (×16): qty 1

## 2017-11-01 MED ORDER — FENTANYL CITRATE (PF) 250 MCG/5ML IJ SOLN
INTRAMUSCULAR | Status: DC | PRN
  Administered 2017-11-01 (×3): 50 ug via INTRAVENOUS

## 2017-11-01 MED ORDER — BUPIVACAINE LIPOSOME 1.3 % IJ SUSP
20.0000 mL | Freq: Once | INTRAMUSCULAR | Status: AC
  Administered 2017-11-01: 20 mL
  Filled 2017-11-01: qty 20

## 2017-11-01 MED ORDER — ALBUTEROL SULFATE HFA 108 (90 BASE) MCG/ACT IN AERS: 2.0000 | INHALATION_SPRAY | Freq: Four times a day (QID) | RESPIRATORY_TRACT | Status: DC | PRN

## 2017-11-01 MED ORDER — HYDROMORPHONE HCL 1 MG/ML IJ SOLN
0.2500 mg | INTRAMUSCULAR | Status: DC | PRN
  Administered 2017-11-01: 0.5 mg via INTRAVENOUS

## 2017-11-01 MED ORDER — HYDROMORPHONE HCL 1 MG/ML IJ SOLN
0.5000 mg | INTRAMUSCULAR | Status: DC | PRN
  Administered 2017-11-01: 1 mg via INTRAVENOUS
  Filled 2017-11-01: qty 1

## 2017-11-01 MED ORDER — MIDAZOLAM HCL 2 MG/2ML IJ SOLN
INTRAMUSCULAR | Status: AC
  Filled 2017-11-01: qty 2

## 2017-11-01 MED ORDER — HYDROMORPHONE HCL 1 MG/ML IJ SOLN
INTRAMUSCULAR | Status: AC
  Administered 2017-11-01: 0.5 mg via INTRAVENOUS
  Filled 2017-11-01: qty 1

## 2017-11-01 MED ORDER — PHENYLEPHRINE 40 MCG/ML (10ML) SYRINGE FOR IV PUSH (FOR BLOOD PRESSURE SUPPORT)
PREFILLED_SYRINGE | INTRAVENOUS | Status: DC | PRN
  Administered 2017-11-01: 120 ug via INTRAVENOUS

## 2017-11-01 MED ORDER — ONDANSETRON HCL 4 MG/2ML IJ SOLN
INTRAMUSCULAR | Status: DC | PRN
  Administered 2017-11-01: 4 mg via INTRAVENOUS

## 2017-11-01 SURGICAL SUPPLY — 70 items
BAG LAPAROSCOPIC 12 15 PORT 16 (BASKET) IMPLANT
BAG RETRIEVAL 12/15 (BASKET)
BAG URO CATCHER STRL LF (MISCELLANEOUS) IMPLANT
BAG ZIPLOCK 12X15 (MISCELLANEOUS) ×2 IMPLANT
BLADE EXTENDED COATED 6.5IN (ELECTRODE) IMPLANT
BLADE SURG SZ10 CARB STEEL (BLADE) IMPLANT
CABLE HIGH FREQUENCY MONO STRZ (ELECTRODE) ×2 IMPLANT
CATH INTERMIT  6FR 70CM (CATHETERS) IMPLANT
CHLORAPREP W/TINT 26ML (MISCELLANEOUS) ×2 IMPLANT
CLEANER TIP ELECTROSURG 2X2 (MISCELLANEOUS) IMPLANT
CLIP VESOLOCK LG 6/CT PURPLE (CLIP) ×2 IMPLANT
CLIP VESOLOCK MED LG 6/CT (CLIP) ×2 IMPLANT
CLIP VESOLOCK XL 6/CT (CLIP) IMPLANT
CLOTH BEACON ORANGE TIMEOUT ST (SAFETY) ×2 IMPLANT
COVER FOOTSWITCH UNIV (MISCELLANEOUS) IMPLANT
COVER SURGICAL LIGHT HANDLE (MISCELLANEOUS) ×2 IMPLANT
CUTTER FLEX LINEAR 45M (STAPLE) ×2 IMPLANT
DERMABOND ADVANCED (GAUZE/BANDAGES/DRESSINGS)
DERMABOND ADVANCED .7 DNX12 (GAUZE/BANDAGES/DRESSINGS) IMPLANT
DRAIN CHANNEL 10F 3/8 F FF (DRAIN) IMPLANT
DRAPE INCISE IOBAN 66X45 STRL (DRAPES) IMPLANT
DRAPE LAPAROSCOPIC ABDOMINAL (DRAPES) ×2 IMPLANT
DRESSING TELFA ISLAND 4X8 (GAUZE/BANDAGES/DRESSINGS) ×2 IMPLANT
DRSG TEGADERM 2-3/8X2-3/4 SM (GAUZE/BANDAGES/DRESSINGS) ×4 IMPLANT
DRSG TEGADERM 4X4.75 (GAUZE/BANDAGES/DRESSINGS) IMPLANT
ELECT PENCIL ROCKER SW 15FT (MISCELLANEOUS) ×2 IMPLANT
ELECT REM PT RETURN 15FT ADLT (MISCELLANEOUS) ×2 IMPLANT
EVACUATOR SILICONE 100CC (DRAIN) ×2 IMPLANT
GAUZE SPONGE 2X2 8PLY STRL LF (GAUZE/BANDAGES/DRESSINGS) ×1 IMPLANT
GLOVE BIO SURGEON STRL SZ 6.5 (GLOVE) ×2 IMPLANT
GLOVE BIO SURGEON STRL SZ7.5 (GLOVE) ×2 IMPLANT
GOWN STRL REUS W/TWL LRG LVL3 (GOWN DISPOSABLE) ×4 IMPLANT
GOWN STRL REUS W/TWL XL LVL3 (GOWN DISPOSABLE) ×2 IMPLANT
GUIDEWIRE STR DUAL SENSOR (WIRE) IMPLANT
HEMOSTAT SURGICEL 4X8 (HEMOSTASIS) IMPLANT
IRRIG SUCT STRYKERFLOW 2 WTIP (MISCELLANEOUS) ×2
IRRIGATION SUCT STRKRFLW 2 WTP (MISCELLANEOUS) ×1 IMPLANT
KIT BASIN OR (CUSTOM PROCEDURE TRAY) ×2 IMPLANT
LIGASURE VESSEL 5MM BLUNT TIP (ELECTROSURGICAL) ×2 IMPLANT
MANIFOLD NEPTUNE II (INSTRUMENTS) ×2 IMPLANT
PACK CYSTO (CUSTOM PROCEDURE TRAY) IMPLANT
POUCH SPECIMEN RETRIEVAL 10MM (ENDOMECHANICALS) IMPLANT
RELOAD 45 VASCULAR/THIN (ENDOMECHANICALS) ×2 IMPLANT
RETRACTOR LAPSCP 12X46 CVD (ENDOMECHANICALS) IMPLANT
RTRCTR LAPSCP 12X46 CVD (ENDOMECHANICALS)
SCISSORS LAP 5X35 DISP (ENDOMECHANICALS) IMPLANT
SPONGE GAUZE 2X2 STER 10/PKG (GAUZE/BANDAGES/DRESSINGS) ×1
SPONGE LAP 18X18 5 PK (GAUZE/BANDAGES/DRESSINGS) ×4 IMPLANT
STAPLER VISISTAT 35W (STAPLE) ×2 IMPLANT
SURGIFLO W/THROMBIN 8M KIT (HEMOSTASIS) IMPLANT
SUT CHROMIC 2 0 SH (SUTURE) IMPLANT
SUT ETHILON 3 0 PS 1 (SUTURE) ×2 IMPLANT
SUT MNCRL AB 4-0 PS2 18 (SUTURE) IMPLANT
SUT PDS AB 0 CTX 60 (SUTURE) ×4 IMPLANT
SUT PDS AB 1 CTX 36 (SUTURE) IMPLANT
SUT VIC AB 2-0 SH 27 (SUTURE)
SUT VIC AB 2-0 SH 27X BRD (SUTURE) IMPLANT
SUT VICRYL 0 UR6 27IN ABS (SUTURE) ×2 IMPLANT
SYS LAPSCP GELPORT 120MM (MISCELLANEOUS) ×2
SYSTEM LAPSCP GELPORT 120MM (MISCELLANEOUS) ×1 IMPLANT
TOWEL OR 17X26 10 PK STRL BLUE (TOWEL DISPOSABLE) ×2 IMPLANT
TRAY FOLEY CATH 16FRSI W/METER (SET/KITS/TRAYS/PACK) ×2 IMPLANT
TRAY LAPAROSCOPIC (CUSTOM PROCEDURE TRAY) ×2 IMPLANT
TROCAR BLADELESS OPT 5 100 (ENDOMECHANICALS) IMPLANT
TROCAR UNIVERSAL OPT 12M 100M (ENDOMECHANICALS) ×2 IMPLANT
TROCAR XCEL 12X100 BLDLESS (ENDOMECHANICALS) ×2 IMPLANT
TROCAR XCEL BLUNT TIP 100MML (ENDOMECHANICALS) ×2 IMPLANT
TUBING CONNECTING 10 (TUBING) IMPLANT
TUBING INSUF HEATED (TUBING) ×2 IMPLANT
YANKAUER SUCT BULB TIP 10FT TU (MISCELLANEOUS) ×2 IMPLANT

## 2017-11-01 NOTE — Transfer of Care (Signed)
Immediate Anesthesia Transfer of Care Note  Patient: Laurie Moreno  Procedure(s) Performed: LEFT HAND ASSISTED LAPAROSCOPIC NEPHRECTOMY (Left )  Patient Location: PACU  Anesthesia Type:General  Level of Consciousness: awake and alert   Airway & Oxygen Therapy: Patient Spontanous Breathing and non-rebreather face mask  Post-op Assessment: Report given to RN and Post -op Vital signs reviewed and stable  Post vital signs: Reviewed and stable  Last Vitals:  Vitals Value Taken Time  BP 132/74 11/01/2017 10:15 AM  Temp    Pulse 71 11/01/2017 10:17 AM  Resp 19 11/01/2017 10:17 AM  SpO2 93 % 11/01/2017 10:17 AM  Vitals shown include unvalidated device data.  Last Pain:  Vitals:   11/01/17 0614  TempSrc:   PainSc: 0-No pain         Complications: No apparent anesthesia complications

## 2017-11-01 NOTE — Interval H&P Note (Signed)
History and Physical Interval Note:  11/01/2017 7:14 AM  Laurie Moreno  has presented today for surgery, with the diagnosis of CHRONIC LEFT PYELONEPHROSIS AND NONFUNCTIONAL KIDNEY  The various methods of treatment have been discussed with the patient and family. After consideration of risks, benefits and other options for treatment, the patient has consented to  Procedure(s): LEFT HAND ASSISTED LAPAROSCOPIC NEPHRECTOMY (Left) CYSTOSCOPY WITH LEFT STENT REMOVAL (Left) as a surgical intervention .  The patient's history has been reviewed, patient examined, no change in status, stable for surgery.  I have reviewed the patient's chart and labs.  Questions were answered to the patient's satisfaction.     Marton Redwood, III

## 2017-11-01 NOTE — H&P (Signed)
CC/HPI: Pt presents today for pre-operative history and physical exam in anticipation of left hand assisted laparoscopic nephrectomy and left ureteral stent removal on 11/01/17 by Dr. Gloriann Loan. Her only complaint is of left flank discomfort. Pt denies F/C, HA, CP, SOB, N/V, diarrhea/constipation, back pain, hematuria, and dysuria.    HX:  CC: Left chronic pyelonephritis  HPI: 67 year old female recently underwent an urgent left ureteral stent placement and aspiration of her renal pelvis of 350 cc of pus. She got discharged on IV antibiotics. Bacteria was Escherichia coli multidrug resistant though sensitive to meropenem. She has had a renal scan in the interval that revealed only 4% function in the left kidney. She has no complaints today. She has been doing well since the ureteral stent placement.     ALLERGIES: codeine - Vomiting, Nausea Penicillin - Hives    MEDICATIONS: Levothyroxine Sodium 100 mcg tablet  Levothyroxine Sodium 125 mcg tablet  Albuterol Sulfate  Aspir 81  Atorvastatin Calcium 40 mg tablet  Carvedilol 3.125 mg tablet  Clonazepam 0.5 mg tablet  Clotrimazole 1 % cream  Divalproex Sodium 250 mg tablet, delayed release  Docusate Sodium  Escitalopram Oxalate 10 mg tablet  Gabapentin 300 mg capsule  Hydroxyzine Hcl  Ipratropium-Albuterol 0.5 mg-3 mg (2.5 mg base)/3 ml ampul for nebulization  Lexapro 10 mg tablet  Loratadine 10 mg tablet  Meropenem 1 gram vial  Multivitamins  Polyethylene Glycol  Primidone 50 mg tablet  Proair Hfa  Senexon-S  Tylenol  Zinc     GU PSH: Cystoscopy Insert Stent, Left - 09/07/2017    NON-GU PSH: Cataract surgery, Bilateral        GU PMH: Microscopic hematuria (Acute), Culture urine. Recheck urine 3 weeks. If hematuria still present in a long standing smoker may need cysto and urine sent for cytology. - 08/31/2017 UPJ obstruction (acquired) (Chronic), Left, F/U CT 3 weeks and OV w/MD. If UPJ obstruction still present may than need renogram or  OP cystourethroscopy, (L) RPG, and double J stent placement. Culture urine. No ABX unless culture proven UTI. - 08/31/2017 Mixed incontinence Pyelonephritis      PMH Notes: Flu and pneumonia Jan 2018 that required intubation. treated at [facility]   Had heart cath in Jan 2018 at [facility] but does not recall being told she had an MI     NON-GU PMH: Anxiety Arrhythmia Bipolar disorder, unspecified Cardiac murmur, unspecified COPD Depression Dermatitis, unspecified GERD Hyperlipidemia, unspecified Myocardial Infarction    FAMILY HISTORY: 1 son - Son Colon Cancer - Father, Mother father deceased at age 61 - Father mother deceased at age 46 - 74 in Family skin cancer - Brother   SOCIAL HISTORY: Marital Status: Single Preferred Language: English; Ethnicity: Not Hispanic Or Latino; Race: White Current Smoking Status: Patient does not smoke anymore. Has not smoked since 08/17/2017. Smoked for 40 years. Smoked less than 1/2 pack per day.  <DIV'  Tobacco Use Assessment Completed:  Used Tobacco in last 30 days?   Does not use smokeless tobacco. Has never drank.  Does not use drugs. Drinks 3 caffeinated drinks per day. Has not had a blood transfusion.    REVIEW OF SYSTEMS:     GU Review Female:  Patient reports hard to postpone urination and leakage of urine. Patient denies frequent urination, burning /pain with urination, get up at night to urinate, stream starts and stops, trouble starting your stream, have to strain to urinate, and being pregnant.    Gastrointestinal (Upper):  Patient denies nausea, vomiting, and indigestion/  heartburn.    Gastrointestinal (Lower):  Patient denies diarrhea and constipation.    Constitutional:  Patient denies night sweats, weight loss, fever, and fatigue.    Skin:  Patient denies skin rash/ lesion and itching.    Eyes:  Patient denies blurred vision and double vision.    Ears/ Nose/ Throat:  Patient denies sore throat and sinus problems.     Hematologic/Lymphatic:  Patient denies swollen glands and easy bruising.    Cardiovascular:  Patient reports leg swelling. Patient denies chest pains.    Respiratory:  Patient denies cough and shortness of breath.    Endocrine:  Patient denies excessive thirst.    Musculoskeletal:  Patient denies back pain and joint pain.    Neurological:  Patient denies headaches and dizziness.    Psychologic:  Patient reports anxiety. Patient denies depression.    VITAL SIGNS:       10/26/2017 01:48 PM     Weight 196 lb / 88.9 kg     Height 63 in / 160.02 cm     BP 110/73 mmHg     Pulse 91 /min     Temperature 98.9 F / 37.1 C     BMI 34.7 kg/m     MULTI-SYSTEM PHYSICAL EXAMINATION:      Constitutional: Well-nourished. No physical deformities. Normally developed. Good grooming.     Neck: Neck symmetrical, not swollen. Normal tracheal position.     Respiratory: Normal breath sounds. No labored breathing, no use of accessory muscles.      Cardiovascular: Regular rate and rhythm. No murmur, no gallop. Normal temperature, normal extremity pulses, no swelling.      Lymphatic: No enlargement of neck, axillae, groin.     Skin: No paleness, no jaundice, no cyanosis. No lesion, no ulcer, no rash.     Neurologic / Psychiatric: Oriented to time, oriented to place, oriented to person. No depression, no anxiety, no agitation.     Gastrointestinal: No mass, no tenderness, no rigidity, non obese abdomen.     Eyes: Normal conjunctivae. Normal eyelids.     Ears, Nose, Mouth, and Throat: Left ear no scars, no lesions, no masses. Right ear no scars, no lesions, no masses. Nose no scars, no lesions, no masses. Normal hearing. Normal lips.     Musculoskeletal: Normal gait and station of head and neck.            PAST DATA REVIEWED:   Source Of History:  Patient  Records Review:  Previous Patient Records  Urine Test Review:  Urinalysis    10/26/17  Urinalysis  Urine Appearance Cloudy   Urine Color Yellow    Urine Glucose Neg   Urine Bilirubin Neg   Urine Ketones Neg   Urine Specific Gravity 1.025   Urine Blood 1+   Urine pH <=5.0   Urine Protein Trace   Urine Urobilinogen 0.2   Urine Nitrites Neg   Urine Leukocyte Esterase Neg   Urine WBC/hpf 0 - 5/hpf   Urine RBC/hpf 3 - 10/hpf   Urine Epithelial Cells 6 - 10/hpf   Urine Bacteria NS (Not Seen)   Urine Mucous Not Present   Urine Yeast NS (Not Seen)   Urine Trichomonas Not Present   Urine Cystals NS (Not Seen)   Urine Casts NS (Not Seen)   Urine Sperm Not Present    PROCEDURES:    Urinalysis w/Scope  Dipstick Dipstick Cont'd Micro  Color: Yellow Bilirubin: Neg WBC/hpf: 0 - 5/hpf  Appearance: Cloudy Ketones: Neg  RBC/hpf: 3 - 10/hpf  Specific Gravity: 1.025 Blood: 1+ Bacteria: NS (Not Seen)  pH: <=5.0 Protein: Trace Cystals: NS (Not Seen)  Glucose: Neg Urobilinogen: 0.2 Casts: NS (Not Seen)   Nitrites: Neg Trichomonas: Not Present   Leukocyte Esterase: Neg Mucous: Not Present    Epithelial Cells: 6 - 10/hpf    Yeast: NS (Not Seen)    Sperm: Not Present    ASSESSMENT:     ICD-10 Details  1 GU:  UPJ obstruction (acquired) - Q62.39 Left   PLAN:   Schedule  Return Visit/Planned Activity: Keep Scheduled Appointment - Schedule Surgery  Document  Letter(s):  Created for Patient: Clinical Summary   Notes:  There are no changes in the patients history or physical exam since last evaluation by Dr. Gloriann Loan. Pt is scheduled to undergo left hand assisted lap nephrectomy and left ureteral stent removal 11/01/17.    All pt's questions were answered to the best of my ability.     Next Appointment:    Next Appointment: 11/01/2017 07:30 AM   Appointment Type: Surgery    Location: [facility] [phone]   Provider: Link Snuffer, III, M.D.   Reason for Visit: TBA WL LT HAND AST LAP NEPHRECTOMY LT STENT REMOVAL RESIDENT AST   Signed by Mcarthur Rossetti, PA on 10/26/17 at 3:26 PM (EDT

## 2017-11-01 NOTE — Op Note (Signed)
Operative Note  Preoperative diagnosis:  1.  Left chronic pyelonephritis with nonfunctional kidney  Postoperative diagnosis: 1.  Left chronic pyelonephritis with nonfunctional kidney   Procedure(s): 1.  Left hand assisted laparoscopic simple nephrectomy with left adrenalectomy 2.  Left ureteral stent removal, intra-abdominal  Surgeon: Link Snuffer, MD  Assistants: Case Clydene Laming, MD, resident, an assistant was needed throughout the case further expertise in assisting with a laparoscopic surgery, including visualization with the camera, passing instruments, etc.  Anesthesia: General  Complications: None  EBL:  150cc  Specimens: 1.  Left kidney and adrenal  Drains/Catheters: 1. Foley catheter  Intraoperative findings: Left kidney removed entirely.  Stent was also removed.  Indication: 67 year old female who presented with left UPJ obstruction and infection.  She underwent a left ureteral catheterization with aspiration of a large amount of pus and ureteral stent placement.  She subsequently underwent a renal scan that confirmed that the kidney was nonfunctional.  After discussion of different options, she elected to undergo the above operation  Description of procedure:  The patient was identified and consent was obtained.  The patient was taken to the operating room and placed in the supine position.  The patient was placed under general anesthesia.  Perioperative antibiotics were administered.  The patient was placed in left lateral position at approximately 65 degrees and all pressure points were padded.  Patient was prepped and draped in a standard sterile fashion and a timeout was performed.  An 8 cm periumbilical incision was made sharply into the skin.  This was carried down with Bovie electrocautery down to the anterior rectus sheath which was divided with electrocautery.  The underlying musculature was separated in the midline.  Sharp dissection with Metzenbaum scissors was used  to open up the posterior sheath and peritoneum.  This was extended with electrocautery taking great care not to use cautery near the bowel.  The hand assist port was secured into the incision.  I made sure no bowel was trapped within this.  A 12 mm port was inserted through the hand assist port and the abdomen was insufflated to a pressure of 15.  A 12 mm port was placed lateral as well as superior to the hand assist port, each 1 about a hand width away. Please note that all ports were placed under direct visualization with the camera.  The colon was first dissected medially by incising along the white line of [place].  After medializing the colon, the kidney was dissected laterally and medially as well as superiorly.  The ureter was identified.  This was carefully divided and the ureteral stent was removed in its entirety.  Inferior attachments as well as the ureter and gonadal vein were divided with LigaSure device. The ureter was clipped with a clip.I continued to carefully dissect medially and identified the renal hilum.  The renal vein and renal artery were divided with a 45 mm vascular staple load.  Superior attachments were then released using LigaSure device as well as blunt dissection.  Once the entire kidney and surrounding Gerota's fascia was freed, the specimen was withdrawn from the midline incision and passed off for permanent specimen.  The abdomen was reinspected and the adrenal was noted to be bleeding.  Attempts of placing a clip to stop the bleeding as well as careful cauterization were unable to stop the bleeding from the left adrenal.  I therefore decided to perform a left adrenalectomy which was performed with LigaSure device.  The specimen was removed and included with  the kidney specimen.  I reinspected the abdomen and also decreased the intra-abdominal pressure to 5.  No active bleeding was noted.  The midline fascia was closed with running 0 looped PDS suture followed by staples.  The port  incisions were closed with a 2-0 Vicryl followed by staples.  Exparel was instilled for anesthetic effect.  A dressing was applied.  Patient tolerated the procedure well and was stable postoperatively.  Plan: Stat labs will be obtained.  Anticipate the patient will be in the hospital 1-2 nights as long as she does well.

## 2017-11-01 NOTE — Anesthesia Postprocedure Evaluation (Signed)
Anesthesia Post Note  Patient: Laurie Moreno  Procedure(s) Performed: LEFT HAND ASSISTED LAPAROSCOPIC NEPHRECTOMY (Left )     Patient location during evaluation: PACU Anesthesia Type: General Level of consciousness: awake and alert Pain management: pain level controlled Vital Signs Assessment: post-procedure vital signs reviewed and stable Respiratory status: spontaneous breathing, nonlabored ventilation and respiratory function stable Cardiovascular status: blood pressure returned to baseline and stable Postop Assessment: no apparent nausea or vomiting Anesthetic complications: no    Last Vitals:  Vitals:   11/01/17 1115 11/01/17 1130  BP: 121/64 116/65  Pulse: 76 78  Resp: 15 18  Temp:  (!) 36.3 C  SpO2: 93% 94%    Last Pain:  Vitals:   11/01/17 1130  TempSrc:   PainSc: Asleep                 Lynda Rainwater

## 2017-11-01 NOTE — Anesthesia Preprocedure Evaluation (Signed)
Anesthesia Evaluation  Patient identified by MRN, date of birth, ID band Patient awake    Reviewed: Allergy & Precautions, NPO status , Patient's Chart, lab work & pertinent test results, reviewed documented beta blocker date and time   Airway Mallampati: III  TM Distance: >3 FB Neck ROM: Full    Dental  (+) Dental Advisory Given, Edentulous Lower   Pulmonary COPD, Current Smoker,  Hx chronic respiratory failure   Pulmonary exam normal breath sounds clear to auscultation       Cardiovascular hypertension, Pt. on home beta blockers and Pt. on medications Normal cardiovascular exam+ dysrhythmias  Rhythm:Regular Rate:Normal  EKG - ST, LAFB   Neuro/Psych Anxiety Bipolar Disorder negative neurological ROS     GI/Hepatic negative GI ROS, Neg liver ROS,   Endo/Other  Hypothyroidism Obesity  Renal/GU Renal InsufficiencyRenal diseaseLeft hydronephrosis  negative genitourinary   Musculoskeletal negative musculoskeletal ROS (+)   Abdominal   Peds negative pediatric ROS (+)  Hematology   Anesthesia Other Findings   Reproductive/Obstetrics                             Anesthesia Physical  Anesthesia Plan  ASA: III  Anesthesia Plan: General   Post-op Pain Management:    Induction: Intravenous  PONV Risk Score and Plan: 3 and Ondansetron, Dexamethasone and Midazolam  Airway Management Planned: Oral ETT  Additional Equipment:   Intra-op Plan:   Post-operative Plan: Extubation in OR  Informed Consent: I have reviewed the patients History and Physical, chart, labs and discussed the procedure including the risks, benefits and alternatives for the proposed anesthesia with the patient or authorized representative who has indicated his/her understanding and acceptance.   Dental advisory given  Plan Discussed with: CRNA and Anesthesiologist  Anesthesia Plan Comments:         Anesthesia  Quick Evaluation

## 2017-11-01 NOTE — Anesthesia Procedure Notes (Signed)
Procedure Name: Intubation Date/Time: 11/01/2017 7:32 AM Performed by: [place] Derick Seminara C, CRNA Pre-anesthesia Checklist: Patient identified, Emergency Drugs available, Suction available and Patient being monitored Patient Re-evaluated:Patient Re-evaluated prior to induction Oxygen Delivery Method: Circle system utilized Preoxygenation: Pre-oxygenation with 100% oxygen Induction Type: IV induction Ventilation: Mask ventilation without difficulty Laryngoscope Size: Mac and 3 Grade View: Grade I Tube type: Oral Tube size: 7.0 mm Number of attempts: 1 Airway Equipment and Method: Stylet and Oral airway Placement Confirmation: ETT inserted through vocal cords under direct vision,  positive ETCO2 and breath sounds checked- equal and bilateral Secured at: 20 cm Tube secured with: Tape Dental Injury: Teeth and Oropharynx as per pre-operative assessment

## 2017-11-02 ENCOUNTER — Inpatient Hospital Stay (HOSPITAL_COMMUNITY): Payer: Medicare Other

## 2017-11-02 ENCOUNTER — Encounter (HOSPITAL_COMMUNITY): Payer: Self-pay | Admitting: Urology

## 2017-11-02 DIAGNOSIS — J9601 Acute respiratory failure with hypoxia: Secondary | ICD-10-CM

## 2017-11-02 DIAGNOSIS — I5033 Acute on chronic diastolic (congestive) heart failure: Secondary | ICD-10-CM

## 2017-11-02 LAB — VALPROIC ACID LEVEL: Valproic Acid Lvl: <10 ug/mL — ABNORMAL LOW (ref 50.0–100.0)

## 2017-11-02 LAB — BASIC METABOLIC PANEL
ANION GAP: 6 (ref 5–15)
BUN: 14 mg/dL (ref 8–23)
CO2: 30 mmol/L (ref 22–32)
Calcium: 8.1 mg/dL — ABNORMAL LOW (ref 8.9–10.3)
Chloride: 105 mmol/L (ref 98–111)
Creatinine, Ser: 0.76 mg/dL (ref 0.44–1.00)
GFR calc Af Amer: >60 mL/min (ref >60)
GFR calc non Af Amer: >60 mL/min (ref >60)
GLUCOSE: 158 mg/dL — AB (ref 70–99)
POTASSIUM: 4.7 mmol/L (ref 3.5–5.1)
Sodium: 141 mmol/L (ref 135–145)

## 2017-11-02 LAB — HEMOGLOBIN AND HEMATOCRIT, BLOOD
HCT: 33.6 % — ABNORMAL LOW (ref 36.0–46.0)
Hemoglobin: 10.4 g/dL — ABNORMAL LOW (ref 12.0–15.0)

## 2017-11-02 LAB — HIV ANTIBODY (ROUTINE TESTING W REFLEX): HIV Screen 4th Generation wRfx: NONREACTIVE

## 2017-11-02 MED ORDER — SODIUM CHLORIDE 0.9 % IV SOLN
1.0000 g | Freq: Three times a day (TID) | INTRAVENOUS | Status: DC
  Administered 2017-11-02 – 2017-11-03 (×3): 1 g via INTRAVENOUS
  Filled 2017-11-02 (×3): qty 1

## 2017-11-02 MED ORDER — FUROSEMIDE 10 MG/ML IJ SOLN
40.0000 mg | Freq: Once | INTRAMUSCULAR | Status: AC
  Administered 2017-11-03: 40 mg via INTRAVENOUS
  Filled 2017-11-02: qty 4

## 2017-11-02 MED ORDER — FUROSEMIDE 10 MG/ML IJ SOLN
60.0000 mg | Freq: Once | INTRAMUSCULAR | Status: AC
  Administered 2017-11-02: 60 mg via INTRAVENOUS
  Filled 2017-11-02: qty 6

## 2017-11-02 MED ORDER — ENOXAPARIN SODIUM 40 MG/0.4ML ~~LOC~~ SOLN
40.0000 mg | SUBCUTANEOUS | Status: DC
  Administered 2017-11-02 – 2017-11-09 (×8): 40 mg via SUBCUTANEOUS
  Filled 2017-11-02 (×8): qty 0.4

## 2017-11-02 NOTE — Progress Notes (Signed)
ST at bedside working with patient

## 2017-11-02 NOTE — Consult Note (Signed)
Medical Consultation   Laurie Moreno  MAU:[id number]  DOB: [birth year]   DOA: 11/01/2017 PCP: Salvadore Dom, MD   Requesting physician: Dr. Gloriann Loan   Reason for consultation: Hypoxia    History of Present Illness: Laurie Moreno is an 67 y.o. female with significant medical history of bipolar disorder, nephrolithiasis, COPD, dCHF, hypothyroidism and recurrent pyelonephritis with stent placement.  Patient was admitted for left nephrectomy due to chronic pyelo.  Patient is status post left nephrectomy and has been unable to wean off oxygen.  This morning patient became lethargic and hypoxic to the low 80s requiring high flow nasal cannula.  Per nursing staff patient last night was choking with food and having rattling sound and cough.   Today she was lethargic, however responding to commands. Sister at bedside, she reports that she feels that her sister is overmedicated with too much Depakote, Klonopin and pain medications.  Review of Systems:  Level 5 caveat  Past Medical History: Past Medical History:  Diagnosis Date  . Acute pyelonephritis   . Anxiety   . Bipolar disorder, unspecified ([place])    From [facility] form  . CHF (congestive heart failure) ([place])    see LOV 06-25-16 epic care everywhere ; "sister reports that patient has a 'weak heart'  but they did testing and she hasnt had to go back since"  . Chronic respiratory failure with hypoxia ([place])   . Complication of anesthesia    reports with last surgery on 09-07-17 it took 3 hours for her to wake up and she was on the ventilator post operatively for an additional  8 hours   . COPD (chronic obstructive pulmonary disease) ([place])   . Dermatitis   . Dermatitis   . Dysrhythmia   . Elevated temperature 10/26/2017   temp of 99.9 , denies cold/flu sx; reports she gets frwquent UTIs, patient sister accompanying today  reports she is going to Dr Gloriann Loan (surgeon) office today  for pre-op appt  at 2pm  . Essential tremor   .  Family history of adverse reaction to anesthesia    sister gets nausea and vomiting   . Fever   . History of ESBL E. coli infection    10-26-17 this RN did not enter this ESBL inf hx; patient and sister deny hx of ESBL infection and there is no supporting lab work for ESBL infection in epic   . Hyperlipidemia   . Hypothyroidism   . Thrombocytopenia ([place])   . Unspecified hydronephrosis   . Urinary incontinence     Past Surgical History: Past Surgical History:  Procedure Laterality Date  . CARDIAC CATHETERIZATION  07/15/2016   at [place] Left 09/07/2017   Procedure: CYSTOSCOPY WITH RETROGRADE PYELOGRAM/URETERAL STENT PLACEMENT;  Surgeon: Lucas Mallow, MD;  Location: [facility];  Service: Urology;  Laterality: Left;  . EYE SURGERY  5 years ago    bilateral cataract extraction   . LAPAROSCOPIC NEPHRECTOMY, HAND ASSISTED Left 11/01/2017   Procedure: LEFT HAND ASSISTED LAPAROSCOPIC NEPHRECTOMY;  Surgeon: Lucas Mallow, MD;  Location: [facility];  Service: Urology;  Laterality: Left;     Allergies:   Allergies  Allergen Reactions  . Codeine Nausea And Vomiting    In cough syrup cause nausea and vomiting  . Penicillins Hives, Rash and Other (See Comments)    Not effective per patient. Pt does not know. She  states it was when she was a child. Unknown    Social History:  reports that she has been smoking cigarettes.  She has a 45.00 pack-year smoking history. She has never used smokeless tobacco. She reports that she drank alcohol. She reports that she has current or past drug history.  Family History: History reviewed. No pertinent family history.  Physical Exam: Vitals:   11/02/17 1200 11/02/17 1300 11/02/17 1445 11/02/17 1500  BP: (!) 147/71 137/75 132/68 135/64  Pulse: 96 95 97 96  Resp: (!) 23 (!) 28 14 (!) 24  Temp: (!) 100.6 F (38.1 C)     TempSrc: Oral     SpO2: 93% 94% 92% 92%  Weight:      Height:        Constitutional:  Lethargic, responding to verbal stimuli. Eyes: PERLA, EOMI, facial puffiness  Neck: neck appears normal, no masses, no thyromegaly, no JVD  CVS: S1-S2 clear, no murmur rubs or gallops, 1+ pitting edema bilaterally, normal pedal pulses  Respiratory: Decreased breath sounds bilaterally, diffuse crackle up to mid fields. Abdomen: soft positive diffuse tenderness, normal bowel sounds, no organomegaly Musculoskeletal: No contractures, good range of motion x 4 Neuro: Cranial nerves II-XII intact, strength 4/5 x 4, slow response, following commands. Psych: Alert oriented x3.  Skin: no rashes or lesions   Data reviewed:  I have personally reviewed following labs and imaging studies Labs:  CBC: Recent Labs  Lab 11/01/17 1031 11/02/17 0320  HGB 12.5 10.4*  HCT 39.0 33.6*    Basic Metabolic Panel: Recent Labs  Lab 11/01/17 1031 11/02/17 0320  NA 142 141  K 4.9 4.7  CL 109 105  CO2 24 30  GLUCOSE 165* 158*  BUN 17 14  CREATININE 0.67 0.76  CALCIUM 8.2* 8.1*   GFR Estimated Creatinine Clearance: 75.6 mL/min (by C-G formula based on SCr of 0.76 mg/dL). Liver Function Tests: No results for input(s): AST, ALT, ALKPHOS, BILITOT, PROT, ALBUMIN in the last 168 hours. No results for input(s): LIPASE, AMYLASE in the last 168 hours. No results for input(s): AMMONIA in the last 168 hours. Coagulation profile No results for input(s): INR, PROTIME in the last 168 hours.  Cardiac Enzymes: No results for input(s): CKTOTAL, CKMB, CKMBINDEX, TROPONINI in the last 168 hours. BNP: Invalid input(s): POCBNP CBG: No results for input(s): GLUCAP in the last 168 hours. D-Dimer No results for input(s): DDIMER in the last 72 hours. Hgb A1c No results for input(s): HGBA1C in the last 72 hours. Lipid Profile No results for input(s): CHOL, HDL, LDLCALC, TRIG, CHOLHDL, LDLDIRECT in the last 72 hours. Thyroid function studies No results for input(s): TSH, T4TOTAL, T3FREE, THYROIDAB in the last 72  hours.  Invalid input(s): FREET3 Anemia work up No results for input(s): VITAMINB12, FOLATE, FERRITIN, TIBC, IRON, RETICCTPCT in the last 72 hours. Urinalysis    Component Value Date/Time   COLORURINE YELLOW 09/07/2017 1857   APPEARANCEUR CLEAR 09/07/2017 1857   LABSPEC 1.014 09/07/2017 1857   PHURINE 6.0 09/07/2017 1857   GLUCOSEU NEGATIVE 09/07/2017 1857   HGBUR MODERATE (A) 09/07/2017 1857   BILIRUBINUR NEGATIVE 09/07/2017 1857   KETONESUR NEGATIVE 09/07/2017 1857   PROTEINUR 100 (A) 09/07/2017 1857   NITRITE NEGATIVE 09/07/2017 1857   LEUKOCYTESUR TRACE (A) 09/07/2017 1857     Microbiology No results found for this or any previous visit (from the past 240 hour(s)).   Inpatient Medications:   Scheduled Meds: . atorvastatin  40 mg Oral QHS  . carvedilol  3.125  mg Oral BID WC  . clonazePAM  0.5 mg Oral BID  . divalproex  250 mg Oral TID  . docusate sodium  100 mg Oral BID  . enoxaparin (LOVENOX) injection  40 mg Subcutaneous Q24H  . escitalopram  10 mg Oral q morning - 10a  . gabapentin  300 mg Oral QID  . levothyroxine  125 mcg Oral QAC breakfast  . primidone  50 mg Oral QHS  . senna  1 tablet Oral BID   Continuous Infusions: . meropenem (MERREM) IV      Radiological Exams on Admission: Dg Chest 2 View  Result Date: 11/02/2017 CLINICAL DATA:  Weakness and shortness of breath. EXAM: CHEST - 2 VIEW COMPARISON:  Chest x-ray dated Sep 08, 2017. FINDINGS: Interval removal of the endotracheal tube. Worsened pulmonary vascular congestion and increasing small bilateral pleural effusions. Bibasilar atelectasis. No pneumothorax. No acute osseous abnormality. IMPRESSION: Worsening pulmonary vascular congestion and increasing small bilateral pleural effusions. Electronically Signed   By: Titus Dubin M.D.   On: 11/02/2017 14:40    Impression/Recommendations Chronic pyelonephritis status post left nephrectomy Continue management per urology  Acute respiratory failure with  hypoxia Multifactorial in setting of fluid overload and overmedication.  Chest x-ray shows increasing vascular congestion, worsening from before.  Patient with lower extremity edema and crackles on lung exam.  Patient was receiving aggressive IV fluid post surgical procedure.  Discontinue IVF, will give 1 dose of Lasix 60 mg IV.  Repeat 40 mg dose in the morning and reevaluate clinically and check renal function.  Continue O2 supplementation and wean off as able.  Last echocardiogram 07/2016 EF of 50 to 55%.  Will repeat echocardiogram.  Regarding overmedication will discontinue Dilaudid, try oral pain management.  Check valproic acid level as this can be contributing as well.  There was a concern for aspiration, patient has been placed on n.p.o.  She has been treated with Merrem which should cover aspiration pneumonia as well.  Acute on chronic diastolic CHF See above, strict I&O's and daily weights  COPD Does not seems to be an exacerbation at this time, continue albuterol as needed  Bipolar disorder On Depakote 3 times a day, checking levels On Klonopin  Hypothyroidism Continue synthroid   Thank you for this consultation.  Our [facility] hospitalist team will follow the patient with you.  Time Spent: 110 minutes   Chipper Oman M.D. [facility]  11/02/2017, 3:28 PM  Pager please text page via  [email or web address]  Note - This record has been created using [employer]. Chart creation errors have been sought, but may not always have been located. Such creation errors do not reflect on the standard of medical care.

## 2017-11-02 NOTE — Progress Notes (Signed)
Paged Dr.Wood about ST and patient's lethargy.  Dr. Clydene Laming aware of patient's lung sounds, lethargy, and possibly needing CXR.  Dr. Clydene Laming called back and stated he will discuss in more detail with Dr.Bell about patient.  Will continue to monitor

## 2017-11-02 NOTE — Evaluation (Signed)
Clinical/Bedside Swallow Evaluation Patient Details  Name: Laurie Moreno MRN: [id number] Date of Birth: October 04, 1950  Today's Date: 11/02/2017 Time: SLP Start Time (ACUTE ONLY): 0930 SLP Stop Time (ACUTE ONLY): 0955 SLP Time Calculation (min) (ACUTE ONLY): 25 min  Past Medical History:  Past Medical History:  Diagnosis Date  . Acute pyelonephritis   . Anxiety   . Bipolar disorder, unspecified ([place])    From [facility] form  . CHF (congestive heart failure) ([place])    see LOV 06-25-16 epic care everywhere ; "sister reports that patient has a 'weak heart'  but they did testing and she hasnt had to go back since"  . Chronic respiratory failure with hypoxia ([place])   . Complication of anesthesia    reports with last surgery on 09-07-17 it took 3 hours for her to wake up and she was on the ventilator post operatively for an additional  8 hours   . COPD (chronic obstructive pulmonary disease) ([place])   . Dermatitis   . Dermatitis   . Dysrhythmia   . Elevated temperature 10/26/2017   temp of 99.9 , denies cold/flu sx; reports she gets frwquent UTIs, patient sister accompanying today  reports she is going to Dr Gloriann Loan (surgeon) office today  for pre-op appt  at 2pm  . Essential tremor   . Family history of adverse reaction to anesthesia    sister gets nausea and vomiting   . Fever   . History of ESBL E. coli infection    10-26-17 this RN did not enter this ESBL inf hx; patient and sister deny hx of ESBL infection and there is no supporting lab work for ESBL infection in epic   . Hyperlipidemia   . Hypothyroidism   . Thrombocytopenia ([place])   . Unspecified hydronephrosis   . Urinary incontinence    Past Surgical History:  Past Surgical History:  Procedure Laterality Date  . CARDIAC CATHETERIZATION  07/15/2016   at [place] Left 09/07/2017   Procedure: CYSTOSCOPY WITH RETROGRADE PYELOGRAM/URETERAL STENT PLACEMENT;  Surgeon: Lucas Mallow, MD;  Location: [facility];  Service: Urology;  Laterality: Left;  . EYE SURGERY  5 years ago    bilateral cataract extraction   . LAPAROSCOPIC NEPHRECTOMY, HAND ASSISTED Left 11/01/2017   Procedure: LEFT HAND ASSISTED LAPAROSCOPIC NEPHRECTOMY;  Surgeon: Lucas Mallow, MD;  [facility];  Service: Urology;  Laterality: Left;   HPI:  67 yo female adm to [facility] for pyelonephritis - pt is s/p left adrenalectomy and stent removal also.  PMH + for left chronic pyelonephritis, COPD, bipolar, HCAP, essential tremor, decreased toelrance of anesthesia.  Pt noted to be choking with liquids/soup this am with decrease oxygen saturation noted.  Swallow eval ordered.  Pt has been seen previously by SLP - April 2019 with functional swallow noted.  She was in hospital 09/2017 with respiratory failure and required ETT.  Pt admits to premorbid h/o "choking" on liquids at times ,denies reflux nor having prior swallow test completed in xray.  She did not have great awareness to her aspiration event with soup this am.    Assessment / Plan / Recommendation Clinical Impression  Pt is sleepy and required stimulation to maintain LOA.  She is also demonstrating clinical indications of dysphagia c/b mulitple swallows with all boluses.  Pt denies sensing residuals but uncertain to adqeuacy of sensation.  Pt also with decreased oxygen saturation with nasal cannula placement and facemask removal.  Recommend npo x medicine with puree, single ice chips and tsps water.  Will follow up for po readiness and asked RN to page later today if pt becomes adequately alert for intake.   SLP Visit Diagnosis: Dysphagia, unspecified (R13.10)    Aspiration Risk  Severe aspiration risk    Diet Recommendation NPO except meds;Ice chips PRN after oral care   Medication Administration: Whole meds with puree    Other  Recommendations Oral Care Recommendations: Oral care QID   Follow up Recommendations (tbd)      Frequency and Duration min 1 x/week  1 week        Prognosis Prognosis for Safe Diet Advancement: Fair      Swallow Study   General Date of Onset: 11/02/17 HPI: 67 yo female adm to [facility] for pyelonephritis - pt is s/p left adrenalectomy and stent removal also.  PMH + for left chronic pyelonephritis, COPD, bipolar, HCAP, essential tremor, decreased toelrance of anesthesia.  Pt noted to be choking with liquids/soup this am with decrease oxygen saturation noted.  Swallow eval ordered.  Pt has been seen previously by SLP - April 2019 with functional swallow noted.  She was in hospital 09/2017 with respiratory failure and required ETT.  Pt admits to premorbid h/o "choking" on liquids at times ,denies reflux nor having prior swallow test completed in xray.  She did not have great awareness to her aspiration event with soup this am.  Type of Study: Bedside Swallow Evaluation Diet Prior to this Study: Thin liquids(clears but nurse made pt NPO) Temperature Spikes Noted: Yes(101) Respiratory Status: Non-rebreather History of Recent Intubation: No Behavior/Cognition: Other (Comment);Cooperative;Lethargic/Drowsy(became sleepy during eval requiring consistent stimulation to remain alert) Oral Cavity Assessment: Within Functional Limits Oral Care Completed by SLP: No Oral Cavity - Dentition: Dentures, bottom;Dentures, top;Other (Comment)(not placed as pt on clear liquids) Vision: Impaired for self-feeding Self-Feeding Abilities: Total assist Patient Positioning: Upright in bed Baseline Vocal Quality: Low vocal intensity Volitional Cough: Weak Volitional Swallow: Able to elicit    Oral/Motor/Sensory Function Overall Oral Motor/Sensory Function: Mild impairment(slight lingual deviation to left upon protrusion) Lingual ROM: Reduced left Lingual Strength: Reduced   Ice Chips Ice chips: Within functional limits Presentation: Spoon   Thin Liquid Thin Liquid: Impaired Presentation: Spoon Oral Phase Functional Implications: Oral holding Pharyngeal  Phase  Impairments: Multiple swallows    Nectar Thick Nectar Thick Liquid: Not tested   Honey Thick Honey Thick Liquid: Not tested   Puree Puree: Impaired Presentation: Self Fed;Spoon Oral Phase Functional Implications: Prolonged oral transit;Oral holding Pharyngeal Phase Impairments: Multiple swallows   Solid   GO   Solid: Not tested        Macario Golds 11/02/2017,10:17 AM  Luanna Salk, [facility] SLP [phone]

## 2017-11-02 NOTE — Progress Notes (Signed)
Urology Progress Note   1 Day Post-Op  Subjective/Interval Events: NAEON. Tmax 101.3, most recently 98.8 this morning. SPBs in low 100s. Required NRB this am after slight drop in O2 sats to upper 80s, nursing concerned for possible aspiration with drinking soup this am.  Objective: Vital signs in last 24 hours: Temp:  [97.4 F (36.3 C)-101.3 F (38.5 C)] 98.8 F (37.1 C) (07/02 0800) Pulse Rate:  [70-101] 95 (07/02 0745) Resp:  [10-25] 24 (07/02 0745) BP: (99-138)/(43-86) 100/43 (07/02 0700) SpO2:  [87 %-95 %] 92 % (07/02 0745) Weight:  [94.3 kg (207 lb 14.3 oz)] 94.3 kg (207 lb 14.3 oz) (07/01 1217)  Intake/Output from previous day: 07/01 0701 - 07/02 0700 In: 3631.7 [I.V.:3431.7; IV Piggyback:200] Out: 1900 [Urine:1700; Blood:200] Intake/Output this shift: Total I/O In: -  Out: 700 [Urine:700]  Physical Exam:  General: Alert and oriented CV: Regular rate, mild hypotension Lungs: Persistent O2 requirement Abdomen: Soft, appropriately tender. Island dressing in place. GU: Foley in place draining clear yellow urine  Ext: NT, No erythema  Lab Results: Recent Labs    11/01/17 1031 11/02/17 0320  HGB 12.5 10.4*  HCT 39.0 33.6*   BMET Recent Labs    11/01/17 1031 11/02/17 0320  NA 142 141  K 4.9 4.7  CL 109 105  CO2 24 30  GLUCOSE 165* 158*  BUN 17 14  CREATININE 0.67 0.76  CALCIUM 8.2* 8.1*     Studies/Results: No results found.  Assessment/Plan:  67 y.o. female s/p hand assist lap left nephrectomy/adrenalectomy. Patient remains in step down with pesistent O2 requirement.   - Pain well controlled with PO and IV medication. - Wean O2 as able. Continue IS/OOB. - IVFs decreased to 75 mL/hr. - Continue CLD, speech eval today given nursing concern for aspiration. - Lovenox ppx. - PT/OT today.   Dispo: Step down   LOS: 1 day   Ritchie Klee Rob Bunting 11/02/2017, 8:03 AM

## 2017-11-02 NOTE — Progress Notes (Signed)
PT Cancellation Note  Patient Details Name: Laurie Moreno MRN: [id number] DOB: January 26, 1951   Cancelled Treatment:    Reason Eval/Treat Not Completed: Medical issues which prohibited therapy, On HFNC, will check back in AM.   Claretha Cooper 11/02/2017, 3:51 PM Tresa Endo PT [phone]

## 2017-11-03 ENCOUNTER — Inpatient Hospital Stay (HOSPITAL_COMMUNITY): Payer: Medicare Other

## 2017-11-03 DIAGNOSIS — D649 Anemia, unspecified: Secondary | ICD-10-CM

## 2017-11-03 DIAGNOSIS — J9621 Acute and chronic respiratory failure with hypoxia: Secondary | ICD-10-CM

## 2017-11-03 DIAGNOSIS — F319 Bipolar disorder, unspecified: Secondary | ICD-10-CM

## 2017-11-03 DIAGNOSIS — J449 Chronic obstructive pulmonary disease, unspecified: Secondary | ICD-10-CM

## 2017-11-03 LAB — CBC WITH DIFFERENTIAL/PLATELET
BASOS ABS: 0 10*3/uL (ref 0.0–0.1)
BASOS PCT: 0 %
EOS ABS: 0 10*3/uL (ref 0.0–0.7)
EOS PCT: 0 %
HCT: 31.8 % — ABNORMAL LOW (ref 36.0–46.0)
Hemoglobin: 10 g/dL — ABNORMAL LOW (ref 12.0–15.0)
Lymphocytes Relative: 22 %
Lymphs Abs: 1.6 10*3/uL (ref 0.7–4.0)
MCH: 29.9 pg (ref 26.0–34.0)
MCHC: 31.4 g/dL (ref 30.0–36.0)
MCV: 95.2 fL (ref 78.0–100.0)
MONO ABS: 0.8 10*3/uL (ref 0.1–1.0)
Monocytes Relative: 11 %
Neutro Abs: 5.1 10*3/uL (ref 1.7–7.7)
Neutrophils Relative %: 67 %
PLATELETS: 96 10*3/uL — AB (ref 150–400)
RBC: 3.34 MIL/uL — AB (ref 3.87–5.11)
RDW: 14.2 % (ref 11.5–15.5)
WBC: 7.6 10*3/uL (ref 4.0–10.5)

## 2017-11-03 LAB — BASIC METABOLIC PANEL
Anion gap: 11 (ref 5–15)
BUN: 16 mg/dL (ref 8–23)
CALCIUM: 8.5 mg/dL — AB (ref 8.9–10.3)
CO2: 30 mmol/L (ref 22–32)
Chloride: 103 mmol/L (ref 98–111)
Creatinine, Ser: 0.71 mg/dL (ref 0.44–1.00)
GFR calc Af Amer: >60 mL/min (ref >60)
GLUCOSE: 155 mg/dL — AB (ref 70–99)
POTASSIUM: 4.4 mmol/L (ref 3.5–5.1)
SODIUM: 144 mmol/L (ref 135–145)

## 2017-11-03 LAB — STREP PNEUMONIAE URINARY ANTIGEN: Strep Pneumo Urinary Antigen: NEGATIVE

## 2017-11-03 LAB — ECHOCARDIOGRAM COMPLETE
HEIGHTINCHES: 63 in
WEIGHTICAEL: 3326.3 [oz_av]

## 2017-11-03 LAB — MRSA PCR SCREENING: MRSA by PCR: NEGATIVE

## 2017-11-03 MED ORDER — ORAL CARE MOUTH RINSE
15.0000 mL | Freq: Two times a day (BID) | OROMUCOSAL | Status: DC
  Administered 2017-11-03 – 2017-11-09 (×10): 15 mL via OROMUCOSAL

## 2017-11-03 MED ORDER — FUROSEMIDE 10 MG/ML IJ SOLN
40.0000 mg | Freq: Two times a day (BID) | INTRAMUSCULAR | Status: DC
  Administered 2017-11-03 – 2017-11-04 (×2): 40 mg via INTRAVENOUS
  Filled 2017-11-03 (×2): qty 4

## 2017-11-03 MED ORDER — SODIUM CHLORIDE 0.9 % IV SOLN
1.0000 g | Freq: Three times a day (TID) | INTRAVENOUS | Status: DC
  Administered 2017-11-03 – 2017-11-05 (×6): 1 g via INTRAVENOUS
  Filled 2017-11-03 (×10): qty 1

## 2017-11-03 MED ORDER — VANCOMYCIN HCL 10 G IV SOLR
2000.0000 mg | Freq: Once | INTRAVENOUS | Status: AC
  Administered 2017-11-03: 2000 mg via INTRAVENOUS
  Filled 2017-11-03: qty 2000

## 2017-11-03 MED ORDER — VANCOMYCIN HCL IN DEXTROSE 1-5 GM/200ML-% IV SOLN: 1000.0000 mg | INTRAVENOUS | Status: DC

## 2017-11-03 MED ORDER — SODIUM CHLORIDE 0.9 % IV SOLN
INTRAVENOUS | Status: DC | PRN
  Administered 2017-11-03: 08:00:00 via INTRAVENOUS

## 2017-11-03 NOTE — Progress Notes (Signed)
Pharmacy Antibiotic Note  Laurie Moreno is a 67 y.o. female admitted on 11/01/2017 with pneumonia.  Pharmacy has been consulted for vanc/cefepime dosing.  Plan: 1) Vancomycin 2g x 1 then 1g q24 - goal AUC 400-500 2) Cont Cefepime 1g IV q8 as ordered  Height: 5\' 3"  (160 cm) Weight: 207 lb 14.3 oz (94.3 kg) IBW/kg (Calculated) : 52.4  Temp (24hrs), Avg:99.5 F (37.5 C), Min:98.7 F (37.1 C), Max:100.6 F (38.1 C)  Recent Labs  Lab 11/01/17 1031 11/02/17 0320 11/03/17 0352  WBC  --   --  7.6  CREATININE 0.67 0.76 0.71    Estimated Creatinine Clearance: 75.6 mL/min (by C-G formula based on SCr of 0.71 mg/dL).    Allergies  Allergen Reactions  . Codeine Nausea And Vomiting    In cough syrup cause nausea and vomiting  . Penicillins Hives, Rash and Other (See Comments)    Not effective per patient. Pt does not know. She states it was when she was a child. Unknown     Thank you for allowing pharmacy to be a part of this patient's care.  Adrian Saran, PharmD, BCPS Pager [phone] 11/03/2017 12:12 PM

## 2017-11-03 NOTE — Evaluation (Signed)
Physical Therapy Evaluation Patient Details Name: Laurie Moreno MRN: [id number] DOB: 1950-09-14 Today's Date: 11/03/2017   History of Present Illness  Pt is s/p nephrectomy/adrenalectomy and L stent removal due to pylonephritis. H/o bipolar and essential tremor   Clinical Impression  The patient mobilized well today, requiring 2 assist  With RW. Patient may require higher level of care initially than ALF. Patient reports  Ambulating and getting along to BR  without assistance  At ALF . Pt admitted with above diagnosis. Pt currently with functional limitations due to the deficits listed below (see PT Problem List).  Pt will benefit from skilled PT to increase their independence and safety with mobility to allow discharge to the venue listed below.     Follow Up Recommendations SNF    Equipment Recommendations  None recommended by PT    Recommendations for Other Services       Precautions / Restrictions Precautions Precautions: Fall Precaution Comments: aqbdominal incision Restrictions Weight Bearing Restrictions: No      Mobility  Bed Mobility Overal bed mobility: Needs Assistance Bed Mobility: Supine to Sit     Supine to sit: Min assist;Mod assist;HOB elevated     General bed mobility comments: assist for trunk  Transfers Overall transfer level: Needs assistance Equipment used: Rolling walker (2 wheeled) Transfers: Sit to/from [facility] Sit to Stand: Min assist;+2 safety/equipment Stand pivot transfers: Min assist;+2 safety/equipment       General transfer comment: assist to rise and stabilize.  cues for UE placement  Ambulation/Gait                Stairs            Wheelchair Mobility    Modified Rankin (Stroke Patients Only)       Balance                                             Pertinent Vitals/Pain Pain Assessment: Faces Faces Pain Scale: Hurts even more Pain Location: abdomen - s/p left kidney  removed Pain Descriptors / Indicators: Discomfort;Grimacing;Crying Pain Intervention(s): Monitored during session;Limited activity within patient's tolerance;Patient requesting pain meds-RN notified    Home Living Family/patient expects to be discharged to:: Assisted living               Home Equipment: Walker - 2 wheels Additional Comments: from ALF. Performed ADLs and toileting independently    Prior Function Level of Independence: Needs assistance               Hand Dominance        Extremity/Trunk Assessment   Upper Extremity Assessment Upper Extremity Assessment: Generalized weakness    Lower Extremity Assessment Lower Extremity Assessment: Generalized weakness       Communication   Communication: No difficulties  Cognition Arousal/Alertness: Awake/alert Behavior During Therapy: WFL for tasks assessed/performed Overall Cognitive Status: Within Functional Limits for tasks assessed                                        General Comments General comments (skin integrity, edema, etc.): VSS    Exercises     Assessment/Plan    PT Assessment Patient needs continued PT services  PT Problem List Decreased strength;Decreased range of motion;Decreased activity tolerance;Decreased knowledge of use of  DME;Decreased balance;Decreased mobility       PT Treatment Interventions DME instruction;Therapeutic exercise;Gait training;Functional mobility training;Therapeutic activities;Patient/family education    PT Goals (Current goals can be found in the Care Plan section)  Acute Rehab PT Goals Patient Stated Goal: less pain and return to independence PT Goal Formulation: With patient Time For Goal Achievement: 11/17/17 Potential to Achieve Goals: Good    Frequency Min 2X/week   Barriers to discharge        Co-evaluation PT/OT/SLP Co-Evaluation/Treatment: Yes Reason for Co-Treatment: For patient/therapist safety PT goals addressed during  session: Mobility/safety with mobility OT goals addressed during session: ADL's and self-care       AM-PAC PT "6 Clicks" Daily Activity  Outcome Measure Difficulty turning over in bed (including adjusting bedclothes, sheets and blankets)?: A Lot Difficulty moving from lying on back to sitting on the side of the bed? : A Lot Difficulty sitting down on and standing up from a chair with arms (e.g., wheelchair, bedside commode, etc,.)?: A Lot Help needed moving to and from a bed to chair (including a wheelchair)?: A Lot Help needed walking in hospital room?: A Lot Help needed climbing 3-5 steps with a railing? : Total 6 Click Score: 11    End of Session   Activity Tolerance: Patient tolerated treatment well Patient left: in chair;with call bell/phone within reach;with chair alarm set Nurse Communication: Mobility status PT Visit Diagnosis: Unsteadiness on feet (R26.81);Pain    Time: 4270-6237 PT Time Calculation (min) (ACUTE ONLY): 16 min   Charges:   PT Evaluation $PT Eval Low Complexity: 1 Low     PT G Codes:        Sign   Claretha Cooper 11/03/2017, 12:52 PM

## 2017-11-03 NOTE — Evaluation (Signed)
Occupational Therapy Evaluation Patient Details Name: Laurie Moreno MRN: [id number] DOB: 1950/10/14 Today's Date: 11/03/2017    History of Present Illness Pt is s/p nephrectomy/adrenalectomy and L stent removal due to pylonephritis. H/o bipolar and essential tremor    Clinical Impression   This 67 year old female was admitted for the above. She resides at ALF and is independent with ADLs at baseline.  ADLs are limited by pain, and she needs +2 for sit to stand/transfers for safety at this time. She needs up to total A for LB dressing. Will follow in acute setting with min A level goals.     Follow Up Recommendations  SNF    Equipment Recommendations  3 in 1 bedside commode    Recommendations for Other Services       Precautions / Restrictions Precautions Precautions: Fall Restrictions Weight Bearing Restrictions: No      Mobility Bed Mobility Overal bed mobility: Needs Assistance Bed Mobility: Supine to Sit     Supine to sit: Min assist;Mod assist;HOB elevated     General bed mobility comments: assist for trunk  Transfers Overall transfer level: Needs assistance Equipment used: Rolling walker (2 wheeled) Transfers: Sit to/from [facility] Sit to Stand: Min assist;+2 safety/equipment Stand pivot transfers: Min assist;+2 safety/equipment       General transfer comment: assist to rise and stabilize.  cues for UE placement    Balance                                           ADL either performed or assessed with clinical judgement   ADL Overall ADL's : Needs assistance/impaired Eating/Feeding: Supervision/ safety   Grooming: Brushing hair;Minimal assistance;Sitting   Upper Body Bathing: Minimal assistance;Sitting   Lower Body Bathing: Maximal assistance;Sit to/from stand;+2 for safety/equipment   Upper Body Dressing : Minimal assistance;Sitting   Lower Body Dressing: Total assistance;+2 for safety/equipment;Sit to/from  stand   Toilet Transfer: Minimal assistance;+2 for safety/equipment;Stand-pivot;RW(to chair)   Toileting- Clothing Manipulation and Hygiene: Maximal assistance;+2 for safety/equipment;Sit to/from stand         General ADL Comments: pt had pain which was limiting ADLs.  Had tangles in her hair which she could not manage herself     Vision         Perception     Praxis      Pertinent Vitals/Pain Pain Assessment: Faces Faces Pain Scale: Hurts even more Pain Location: abdomen - s/p left kidney removed Pain Intervention(s): Limited activity within patient's tolerance;Monitored during session;Repositioned;Patient requesting pain meds-RN notified     Hand Dominance     Extremity/Trunk Assessment Upper Extremity Assessment Upper Extremity Assessment: Generalized weakness(tremor not noted)           Communication Communication Communication: No difficulties   Cognition Arousal/Alertness: Awake/alert Behavior During Therapy: WFL for tasks assessed/performed Overall Cognitive Status: Within Functional Limits for tasks assessed                                     General Comments  VSS    Exercises     Shoulder Instructions      Home Living Family/patient expects to be discharged to:: Assisted living  Additional Comments: from ALF. Performed ADLs and toileting independently      Prior Functioning/Environment                   OT Problem List: Decreased strength;Decreased activity tolerance;Impaired balance (sitting and/or standing);Decreased knowledge of use of DME or AE;Pain      OT Treatment/Interventions: Self-care/ADL training;Energy conservation;DME and/or AE instruction;Therapeutic activities;Patient/family education;Balance training    OT Goals(Current goals can be found in the care plan section) Acute Rehab OT Goals Patient Stated Goal: less pain and return to independence OT Goal  Formulation: With patient Time For Goal Achievement: 11/17/17 Potential to Achieve Goals: Good ADL Goals Pt Will Perform Grooming: with min guard assist;standing Pt Will Transfer to Toilet: with min assist;ambulating;bedside commode Additional ADL Goal #1: pt will perform UB adls with set up and LB adls with min A using AE Additional ADL Goal #2: pt will state 3 energy conservation techniques  OT Frequency: Min 2X/week   Barriers to D/C:            Co-evaluation PT/OT/SLP Co-Evaluation/Treatment: Yes Reason for Co-Treatment: For patient/therapist safety PT goals addressed during session: Mobility/safety with mobility OT goals addressed during session: ADL's and self-care      AM-PAC PT "6 Clicks" Daily Activity     Outcome Measure Help from another person eating meals?: A Little Help from another person taking care of personal grooming?: A Little Help from another person toileting, which includes using toliet, bedpan, or urinal?: A Lot Help from another person bathing (including washing, rinsing, drying)?: A Lot Help from another person to put on and taking off regular upper body clothing?: A Little Help from another person to put on and taking off regular lower body clothing?: Total 6 Click Score: 14   End of Session Nurse Communication: Patient requests pain meds  Activity Tolerance: Patient limited by pain;Patient limited by fatigue Patient left: in chair;with call bell/phone within reach;with chair alarm set  OT Visit Diagnosis: Muscle weakness (generalized) (M62.81)                Time: 4098-1191 OT Time Calculation (min): 20 min Charges:  OT General Charges $OT Visit: 1 Visit OT Evaluation $OT Eval Moderate Complexity: 1 Mod G-Codes:     [place], OTR/L [phone] 11/03/2017  Chas Axel 11/03/2017, 11:22 AM

## 2017-11-03 NOTE — Progress Notes (Addendum)
Urology Progress Note   2 Days Post-Op  Subjective/Interval Events: NAEON, patient more alert this am Tmax 100.6, no leukocytosis Mild tachypnea to 22 on HFNC, vitals otherwise stable UOP 6.3L/24hrs, net negative 3.4L this admission after lasix x2 per medicine Is out of bed to chair today Cr and electrolytes WNL Hgb stable   Objective: Vital signs in last 24 hours: Temp:  [98.8 F (37.1 C)-100.6 F (38.1 C)] 99.4 F (37.4 C) (07/03 0800) Pulse Rate:  [82-97] 86 (07/03 0755) Resp:  [14-28] 22 (07/03 0700) BP: (106-154)/(48-108) 129/64 (07/03 0755) SpO2:  [79 %-95 %] 94 % (07/03 0700)  Intake/Output from previous day: 07/02 0701 - 07/03 0700 In: 1177.5 [P.O.:100; I.V.:814.2; IV Piggyback:263.3] Out: 6325 [[id number]] Intake/Output this shift: No intake/output data recorded.  Physical Exam:  General: Alert and oriented CV: Regular rate Lungs: Remains on HFNC Abdomen: Soft, appropriately tender.  Dressings removed and incisions are clean dry and intact without any surrounding erythema.  Staples are in place GU: Foley in place draining clear yellow urine  Ext: NT, No erythema, mild edema  Lab Results: Recent Labs    11/01/17 1031 11/02/17 0320 11/03/17 0352  HGB 12.5 10.4* 10.0*  HCT 39.0 33.6* 31.8*   BMET Recent Labs    11/02/17 0320 11/03/17 0352  NA 141 144  K 4.7 4.4  CL 105 103  CO2 30 30  GLUCOSE 158* 155*  BUN 14 16  CREATININE 0.76 0.71  CALCIUM 8.1* 8.5*     Studies/Results: Dg Chest 2 View  Result Date: 11/02/2017 CLINICAL DATA:  Weakness and shortness of breath. EXAM: CHEST - 2 VIEW COMPARISON:  Chest x-ray dated Sep 08, 2017. FINDINGS: Interval removal of the endotracheal tube. Worsened pulmonary vascular congestion and increasing small bilateral pleural effusions. Bibasilar atelectasis. No pneumothorax. No acute osseous abnormality. IMPRESSION: Worsening pulmonary vascular congestion and increasing small bilateral pleural effusions.  Electronically Signed   By: Titus Dubin M.D.   On: 11/02/2017 14:40    Assessment/Plan:  67 y.o. female s/p hand assist lap left nephrectomy/adrenalectomy. Patient remains in step down with pesistent O2 requirement consistent with fluid overload.  - Pain well controlled. - Wean O2 as able. Continue IS/OOB. - Appreciate medicine following for assistance with management of fluid overload and hypoxia. - Advance diet to dysphagia 2 per speech recommendations.  Appreciate their assistance - Continue Lovenox ppx. - Continue meropenem for now.  Chest x-ray did reveal medial consolidation in both lung bases suspicious for pneumonia.  Now that she is tolerating diet, may transition to an oral agent possibly tomorrow - PT/OT to reevaluate today, continue Foley for now and likely remove that tomorrow.   Dispo: Step down   LOS: 2 days   Case Rob Bunting 11/03/2017, 8:17 AM

## 2017-11-03 NOTE — Progress Notes (Deleted)
OT Cancellation Note  Patient Details Name: Laurie Moreno MRN: [id number] DOB: Dec 06, 1950   Cancelled Treatment:    Reason Eval/Treat Not Completed: Medical issues which prohibited therapy. Noted pt has increased HR (101-111)and RR  (29-40)at this time at rest.  If pt can be seen later, please page me at [phone]; otherwise, I'll check back tomorrow  Ferrah Panagopoulos 11/03/2017, 9:07 AM  Lesle Chris, OTR/L [phone] 11/03/2017

## 2017-11-03 NOTE — Progress Notes (Addendum)
  Speech Language Pathology Treatment: Dysphagia  Patient Details Name: Laurie Moreno MRN: [id number] DOB: 04/25/1951 Today's Date: 11/03/2017 Time: 1696-7893 SLP Time Calculation (min) (ACUTE ONLY): 20 min  Assessment / Plan / Recommendation Clinical Impression  SLP follow up after BSE completed 11/02/17. Pt seated upright in recliner, much more alert and conversant today. Pt with upper dentures in place (lower denture at her sister's house per pt). Pt accepted trials of thin liquid, puree, and solid consistencies. All presentations were tolerated without overt s/s aspiration. RN provided meds with liquids, which pt tolerated well. Pt indicates meats are chopped at her facility. Given current presentation, will recommend dys 2 (finely chopped) diet with thin liquids, meds whole with liquid when ok with surgery team. Safe swallow precautions were reviewed and posted at [facility]. RN, MD, and pt aware of results and recommendations. SLP will follow for diet tolerance assessment and education.    HPI HPI: 67 yo female adm to [facility] for pyelonephritis - pt is s/p left adrenalectomy and stent removal also.  PMH + for left chronic pyelonephritis, COPD, bipolar, HCAP, essential tremor, decreased toelrance of anesthesia.  Pt noted to be choking with liquids/soup this am with decrease oxygen saturation noted.  Swallow eval ordered.  Pt has been seen previously by SLP - April 2019 with functional swallow noted.  She was in hospital 09/2017 with respiratory failure and required ETT.  Pt admits to premorbid h/o "choking" on liquids at times ,denies reflux nor having prior swallow test completed in xray.  She did not have great awareness to her aspiration event with soup this am.       SLP Plan  Continue with current plan of care       Recommendations  Diet recommendations: Dysphagia 2 (fine chop);Thin liquid Liquids provided via: Straw;Cup Medication Administration: Whole meds with liquid Supervision: Patient able to  self feed;Staff to assist with self feeding;Full supervision/cueing for compensatory strategies Compensations: Minimize environmental distractions;Slow rate;Small sips/bites Postural Changes and/or Swallow Maneuvers: Seated upright 90 degrees;Upright 30-60 min after meal                Oral Care Recommendations: Oral care QID Follow up Recommendations: (TBD) SLP Visit Diagnosis: Dysphagia, unspecified (R13.10) Plan: Continue with current plan of care       [place]. Quentin Ore [facility], CCC-SLP Speech Language Pathologist [phone]  Shonna Chock 11/03/2017, 10:48 AM

## 2017-11-03 NOTE — Progress Notes (Signed)
  Echocardiogram 2D Echocardiogram has been performed.  Laurie Moreno 11/03/2017, 9:51 AM

## 2017-11-03 NOTE — Progress Notes (Addendum)
[facility] CONSULT PROGRESS NOTE    Progress Note  Laurie Moreno  PZW:[id number] DOB: [birth year] DOA: 11/01/2017 PCP: Salvadore Dom, MD     Brief Narrative:   Laurie Moreno is an 67 y.o. 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  She was given IV Lasix and her saturations improved  Assessment/Plan:   Acute encephalopathy due to acute  respiratory failure with hypoxia in the setting of fluid overload: Likely due to fluid overload her IV fluids were stopped she was given IV Lasix negative about 3.5 L. She continues to be fluid overloaded on physical exam Creatinine has remained stable electrolytes are within normal. 2D echo is pending. Continue to monitor her electrolytes she is more awake able to carry on a conversation, she relates her pain is controlled. Seems to be fluid overloaded on physical exam check a chest x-ray  Chronic pyelonephritis status post left nephrectomy: Continue current management per urology.  COPD: Not wheezing on physical exam continue albuterol she does not use oxygen at home, will continue to try to wean off her oxygen.  Bipolar 1 disorder ([place]): Continue Depakote vomiting.   Hypothyroidism: Continue Depakote.  Cough possible healthcare associated pneumonia: She is currently on meropenem, will switch her to IV vancomycin and cefepime, she has a penicillin allergy there is a less than 5% cross-reactivity with cephalosporins. The chest x-ray showed new bilateral lower lobes infiltrates. Remain afebrile with no leukocytosis.  DVT prophylaxis: lovenox Family Communication:none Disposition Plan/Barrier to D/C: per Urology Code Status:     Code  Status Orders  (From admission, onward)        Start     Ordered   11/01/17 0956  Full code  Continuous     11/01/17 0959    Code Status History    Date Active Date Inactive Code Status Order ID Comments User Context   09/08/2017 0324 09/13/2017 1608 Full Code [id number]  Elwyn Reach, MD Inpatient   08/21/2017 2004 08/25/2017 1919 Full Code [id number]  Vianne Bulls, MD ED   08/06/2017 0410 08/08/2017 2034 Full Code [id number]  Oswald Hillock, MD Inpatient        IV Access:    Peripheral IV   Procedures and diagnostic studies:   Dg Chest 2 View  Result Date: 11/02/2017 CLINICAL DATA:  Weakness and shortness of breath. EXAM: CHEST - 2 VIEW COMPARISON:  Chest x-ray dated Sep 08, 2017. FINDINGS: Interval removal of the endotracheal tube. Worsened pulmonary vascular congestion and increasing small bilateral pleural effusions. Bibasilar atelectasis. No pneumothorax. No acute osseous abnormality. IMPRESSION: Worsening pulmonary vascular congestion and increasing small bilateral pleural effusions. Electronically Signed   By: Titus Dubin M.D.   On: 11/02/2017 14:40     Medical Consultants:    None.  Anti-Infectives:   IV meropenem  Subjective:    Laurie Moreno she relates her pain is controlled she is hungry and would like to eat. She relates her breathing is slightly better compared to yesterday.  Objective:    Vitals:   11/03/17 0400 11/03/17 0410 11/03/17 0500 11/03/17 0600  BP: 126/67  136/67 131/65  Pulse: 90  89 87  Resp: 17  (!) 25 (!) 24  Temp:  98.8 F (37.1 C)  TempSrc:  Oral    SpO2: 92%  94% 95%  Weight:      Height:        Intake/Output Summary (Last 24 hours) at 11/03/2017 0725 Last data filed at 11/03/2017 0600 Gross per 24 hour  Intake 1177.5 ml  Output 6325 ml  Net -5147.5 ml   Filed Weights   11/01/17 0539 11/01/17 1217  Weight: 87.5 kg (193 lb) 94.3 kg (207 lb 14.3 oz)    Exam: General exam: In no acute distress. Respiratory system:  Good air movement and clear to auscultation. Cardiovascular system: S1 & S2 heard, RRR.  Positive JVD 2+ edema in her lower extremities Gastrointestinal system: Abdomen is nondistended, soft and nontender.  Central nervous system: Alert and oriented. No focal neurological deficits. Extremities: No pedal edema. Skin: No rashes, lesions or ulcers Psychiatry: Judgement and insight appear normal. Mood & affect appropriate.    Data Reviewed:    Labs: Basic Metabolic Panel: Recent Labs  Lab 11/01/17 1031 11/02/17 0320 11/03/17 0352  NA 142 141 144  K 4.9 4.7 4.4  CL 109 105 103  CO2 24 30 30   GLUCOSE 165* 158* 155*  BUN 17 14 16   CREATININE 0.67 0.76 0.71  CALCIUM 8.2* 8.1* 8.5*   GFR Estimated Creatinine Clearance: 75.6 mL/min (by C-G formula based on SCr of 0.71 mg/dL). Liver Function Tests: No results for input(s): AST, ALT, ALKPHOS, BILITOT, PROT, ALBUMIN in the last 168 hours. No results for input(s): LIPASE, AMYLASE in the last 168 hours. No results for input(s): AMMONIA in the last 168 hours. Coagulation profile No results for input(s): INR, PROTIME in the last 168 hours.  CBC: Recent Labs  Lab 11/01/17 1031 11/02/17 0320 11/03/17 0352  WBC  --   --  7.6  NEUTROABS  --   --  5.1  HGB 12.5 10.4* 10.0*  HCT 39.0 33.6* 31.8*  MCV  --   --  95.2  PLT  --   --  96*   Cardiac Enzymes: No results for input(s): CKTOTAL, CKMB, CKMBINDEX, TROPONINI in the last 168 hours. BNP (last 3 results) No results for input(s): PROBNP in the last 8760 hours. CBG: No results for input(s): GLUCAP in the last 168 hours. D-Dimer: No results for input(s): DDIMER in the last 72 hours. Hgb A1c: No results for input(s): HGBA1C in the last 72 hours. Lipid Profile: No results for input(s): CHOL, HDL, LDLCALC, TRIG, CHOLHDL, LDLDIRECT in the last 72 hours. Thyroid function studies: No results for input(s): TSH, T4TOTAL, T3FREE, THYROIDAB in the last 72 hours.  Invalid input(s):  FREET3 Anemia work up: No results for input(s): VITAMINB12, FOLATE, FERRITIN, TIBC, IRON, RETICCTPCT in the last 72 hours. Sepsis Labs: Recent Labs  Lab 11/03/17 0352  WBC 7.6   Microbiology No results found for this or any previous visit (from the past 240 hour(s)).   Medications:   . atorvastatin  40 mg Oral QHS  . carvedilol  3.125 mg Oral BID WC  . clonazePAM  0.5 mg Oral BID  . divalproex  250 mg Oral TID  . docusate sodium  100 mg Oral BID  . enoxaparin (LOVENOX) injection  40 mg Subcutaneous Q24H  . escitalopram  10 mg Oral q morning - 10a  . gabapentin  300 mg Oral QID  . levothyroxine  125 mcg Oral QAC breakfast  . primidone  50 mg Oral QHS  . senna  1 tablet Oral BID   Continuous Infusions: . meropenem (MERREM) IV Stopped (  11/03/17 0117)     LOS: 2 days   [place] [facility] Pager [phone]  *Please refer to [email or web address], password [email or web address] to get updated schedule on who will round on this patient, as hospitalists switch teams weekly. If 7PM-7AM, please contact night-coverage at [email or web address], password [email or web address] for any overnight needs.  11/03/2017, 7:25 AM

## 2017-11-03 NOTE — Therapy (Signed)
Received ok to begin Dys 2 diet/thin liquids per Dr. Gloriann Loan. Order entered. ST will follow.  Jillann Charette B. Quentin Ore, [facility], [place] Speech Language Pathologist [phone]

## 2017-11-04 DIAGNOSIS — J189 Pneumonia, unspecified organism: Secondary | ICD-10-CM

## 2017-11-04 DIAGNOSIS — E8771 Transfusion associated circulatory overload: Secondary | ICD-10-CM

## 2017-11-04 LAB — BASIC METABOLIC PANEL
Anion gap: 9 (ref 5–15)
BUN: 21 mg/dL (ref 8–23)
CALCIUM: 8.6 mg/dL — AB (ref 8.9–10.3)
CO2: 36 mmol/L — ABNORMAL HIGH (ref 22–32)
CREATININE: 0.78 mg/dL (ref 0.44–1.00)
Chloride: 98 mmol/L (ref 98–111)
GFR calc Af Amer: >60 mL/min (ref >60)
GLUCOSE: 164 mg/dL — AB (ref 70–99)
POTASSIUM: 4.2 mmol/L (ref 3.5–5.1)
Sodium: 143 mmol/L (ref 135–145)

## 2017-11-04 LAB — GLUCOSE, CAPILLARY
GLUCOSE-CAPILLARY: 202 mg/dL — AB (ref 70–99)
GLUCOSE-CAPILLARY: 242 mg/dL — AB (ref 70–99)
Glucose-Capillary: 164 mg/dL — ABNORMAL HIGH (ref 70–99)

## 2017-11-04 MED ORDER — ACETAMINOPHEN 10 MG/ML IV SOLN
1000.0000 mg | Freq: Four times a day (QID) | INTRAVENOUS | Status: AC
  Administered 2017-11-04 – 2017-11-05 (×4): 1000 mg via INTRAVENOUS
  Filled 2017-11-04 (×6): qty 100

## 2017-11-04 MED ORDER — INSULIN ASPART 100 UNIT/ML ~~LOC~~ SOLN
0.0000 [IU] | SUBCUTANEOUS | Status: DC
  Administered 2017-11-04: 5 [IU] via SUBCUTANEOUS
  Administered 2017-11-04: 3 [IU] via SUBCUTANEOUS
  Administered 2017-11-04: 5 [IU] via SUBCUTANEOUS
  Administered 2017-11-05 (×2): 3 [IU] via SUBCUTANEOUS
  Administered 2017-11-05 (×2): 2 [IU] via SUBCUTANEOUS
  Administered 2017-11-05 – 2017-11-06 (×2): 3 [IU] via SUBCUTANEOUS
  Administered 2017-11-06: 5 [IU] via SUBCUTANEOUS
  Administered 2017-11-06: 3 [IU] via SUBCUTANEOUS
  Administered 2017-11-06: 2 [IU] via SUBCUTANEOUS
  Administered 2017-11-06: 5 [IU] via SUBCUTANEOUS
  Administered 2017-11-07 (×3): 2 [IU] via SUBCUTANEOUS
  Administered 2017-11-07: 3 [IU] via SUBCUTANEOUS
  Administered 2017-11-07 – 2017-11-08 (×3): 2 [IU] via SUBCUTANEOUS
  Administered 2017-11-08: 3 [IU] via SUBCUTANEOUS
  Administered 2017-11-08: 2 [IU] via SUBCUTANEOUS
  Administered 2017-11-08: 3 [IU] via SUBCUTANEOUS
  Administered 2017-11-09 (×4): 2 [IU] via SUBCUTANEOUS

## 2017-11-04 MED ORDER — FUROSEMIDE 10 MG/ML IJ SOLN
40.0000 mg | Freq: Two times a day (BID) | INTRAMUSCULAR | Status: DC
  Administered 2017-11-04: 40 mg via INTRAVENOUS
  Filled 2017-11-04: qty 4

## 2017-11-04 NOTE — Progress Notes (Signed)
[facility] CONSULT PROGRESS NOTE    Progress Note  Laurie Moreno  VQQ:[id number] DOB: [birth year] DOA: 11/01/2017 PCP: Salvadore Dom, MD     Brief Narrative:   Laurie Moreno is an 67 y.o. 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  She was given IV Lasix and her saturations improved  Assessment/Plan:   Acute encephalopathy due to acute  respiratory failure with hypoxia in the setting of fluid overload: Briskdiuresis, she continues to be negative, creatinine continues to hold steady at baseline She continues to be fluid overloaded on physical exam Creatinine has remained stable electrolytes are within normal. 2D echo showed preserved EF with grade 1 diastolic heart failure Continue to monitor her electrolytes she is more awake able to carry on a conversation, she relates her pain is controlled.  Chronic pyelonephritis status post left nephrectomy: Continue current management per urology. Status post surgical intervention  COPD: Not wheezing on physical exam continue albuterol she does not use oxygen at home, will continue to try to wean off her oxygen.  Bipolar 1 disorder (Laurie Moreno): Continue Depakote vomiting.   Hypothyroidism: Continue Depakote.  New healthcare associated pneumonia: Started spiking fevers, she was switched to IV vancomycin and cefepime. The chest x-ray showed new bilateral lower lobes infiltrates. Check a CBC.  DVT prophylaxis: lovenox Family Communication:none Disposition Plan/Barrier to D/C: per Urology Code Status:     Code Status Orders  (From admission, onward)        Start     Ordered   11/01/17 0956  Full code  Continuous     11/01/17 0959      Code Status History    Date Active Date Inactive Code Status Order ID Comments User Context   09/08/2017 0324 09/13/2017 1608 Full Code [id number]  Elwyn Reach, MD Inpatient   08/21/2017 2004 08/25/2017 1919 Full Code [id number]  Laurie Bulls, MD ED   08/06/2017 0410 08/08/2017 2034 Full Code [id number]  Laurie Hillock, MD Inpatient        IV Access:    Peripheral IV   Procedures and diagnostic studies:   Dg Chest 2 View  Result Date: 11/02/2017 CLINICAL DATA:  Weakness and shortness of breath. EXAM: CHEST - 2 VIEW COMPARISON:  Chest x-ray dated Sep 08, 2017. FINDINGS: Interval removal of the endotracheal tube. Worsened pulmonary vascular congestion and increasing small bilateral pleural effusions. Bibasilar atelectasis. No pneumothorax. No acute osseous abnormality. IMPRESSION: Worsening pulmonary vascular congestion and increasing small bilateral pleural effusions. Electronically Signed   By: Titus Dubin M.D.   On: 11/02/2017 14:40   Dg Chest Port 1 View  Result Date: 11/03/2017 CLINICAL DATA:  Shortness of breath EXAM: PORTABLE CHEST 1 VIEW COMPARISON:  November 02, 2017 FINDINGS: There is consolidation in both medial lung bases. Lungs elsewhere are clear. Heart is upper normal in size with pulmonary vascularity normal. No adenopathy. There is upper thoracic dextroscoliosis. IMPRESSION: Medial consolidation in both lung bases. Suspect pneumonia in these areas. Lungs elsewhere clear. Stable cardiac silhouette. Electronically Signed   By: Lowella Grip III M.D.   On: 11/03/2017 09:00     Medical Consultants:    None.  Anti-Infectives:   IV meropenem  Subjective:    Laurie Moreno she has no new complaints,  she relates she feels much better than yesterday  Objective:    Vitals:   11/04/17 0300 11/04/17 0343 11/04/17 0400 11/04/17 0600  BP:   134/80 (!) 107/59  Pulse:   83 73  Resp:   17 19  Temp:  99.1 F (37.3 C)    TempSrc:  Oral    SpO2: 95%   91%  Weight:       Height:        Intake/Output Summary (Last 24 hours) at 11/04/2017 0743 Last data filed at 11/04/2017 0540 Gross per 24 hour  Intake 1030.66 ml  Output 3885 ml  Net -2854.34 ml   Filed Weights   11/01/17 0539 11/01/17 1217  Weight: 87.5 kg (193 lb) 94.3 kg (207 lb 14.3 oz)    Exam: General exam: In no acute distress. Respiratory system: Good air movement and clear to auscultation. Cardiovascular system: S1 & S2 heard, RRR.  Positive JVD. Gastrointestinal system: Abdomen is nondistended, soft and nontender.  Central nervous system: Alert and oriented. No focal neurological deficits. Extremities: No pedal edema. Skin: No rashes, lesions or ulcers Psychiatry: Judgement and insight appear normal. Mood & affect appropriate.    Data Reviewed:    Labs: Basic Metabolic Panel: Recent Labs  Lab 11/01/17 1031 11/02/17 0320 11/03/17 0352 11/04/17 0334  NA 142 141 144 143  K 4.9 4.7 4.4 4.2  CL 109 105 103 98  CO2 24 30 30  36*  GLUCOSE 165* 158* 155* 164*  BUN 17 14 16 21   CREATININE 0.67 0.76 0.71 0.78  CALCIUM 8.2* 8.1* 8.5* 8.6*   GFR Estimated Creatinine Clearance: 75.6 mL/min (by C-G formula based on SCr of 0.78 mg/dL). Liver Function Tests: No results for input(s): AST, ALT, ALKPHOS, BILITOT, PROT, ALBUMIN in the last 168 hours. No results for input(s): LIPASE, AMYLASE in the last 168 hours. No results for input(s): AMMONIA in the last 168 hours. Coagulation profile No results for input(s): INR, PROTIME in the last 168 hours.  CBC: Recent Labs  Lab 11/01/17 1031 11/02/17 0320 11/03/17 0352  WBC  --   --  7.6  NEUTROABS  --   --  5.1  HGB 12.5 10.4* 10.0*  HCT 39.0 33.6* 31.8*  MCV  --   --  95.2  PLT  --   --  96*   Cardiac Enzymes: No results for input(s): CKTOTAL, CKMB, CKMBINDEX, TROPONINI in the last 168 hours. BNP (last 3 results) No results for input(s): PROBNP in the last 8760 hours. CBG: No results for input(s): GLUCAP in the last 168  hours. D-Dimer: No results for input(s): DDIMER in the last 72 hours. Hgb A1c: No results for input(s): HGBA1C in the last 72 hours. Lipid Profile: No results for input(s): CHOL, HDL, LDLCALC, TRIG, CHOLHDL, LDLDIRECT in the last 72 hours. Thyroid function studies: No results for input(s): TSH, T4TOTAL, T3FREE, THYROIDAB in the last 72 hours.  Invalid input(s): FREET3 Anemia work up: No results for input(s): VITAMINB12, FOLATE, FERRITIN, TIBC, IRON, RETICCTPCT in the last 72 hours. Sepsis Labs: Recent Labs  Lab 11/03/17 0352  WBC 7.6   Microbiology Recent Results (from the past 240 hour(s))  MRSA PCR Screening     Status: None   Collection Time: 11/03/17 12:14 PM  Result Value Ref Range Status   MRSA by PCR NEGATIVE NEGATIVE Final    Comment:        The GeneXpert MRSA Assay (FDA approved for NASAL specimens only), is one component of a comprehensive MRSA  colonization surveillance program. It is not intended to diagnose MRSA infection nor to guide or monitor treatment for MRSA infections. Performed at [facility], [place] [address], [place], [place] [address]      Medications:   . atorvastatin  40 mg Oral QHS  . carvedilol  3.125 mg Oral BID WC  . clonazePAM  0.5 mg Oral BID  . divalproex  250 mg Oral TID  . docusate sodium  100 mg Oral BID  . enoxaparin (LOVENOX) injection  40 mg Subcutaneous Q24H  . escitalopram  10 mg Oral q morning - 10a  . furosemide  40 mg Intravenous Q12H  . gabapentin  300 mg Oral QID  . levothyroxine  125 mcg Oral QAC breakfast  . mouth rinse  15 mL Mouth Rinse BID  . primidone  50 mg Oral QHS  . senna  1 tablet Oral BID   Continuous Infusions: . sodium chloride 10 mL/hr at 11/03/17 0900  . ceFEPime (MAXIPIME) IV Stopped (11/04/17 5790)  . vancomycin       LOS: 3 days   [facility] Pager [phone]  *Please refer to [email or web address], password [email or web address] to get updated schedule on who will round  on this patient, as hospitalists switch teams weekly. If 7PM-7AM, please contact night-coverage at [email or web address], password [email or web address] for any overnight needs.  11/04/2017, 7:43 AM

## 2017-11-04 NOTE — Progress Notes (Signed)
Pt transferred to [place], report called to RN and pt transferred in wheelchair on 2 liters nasal cannula

## 2017-11-04 NOTE — Progress Notes (Signed)
Received verbal order from Dr Louis Meckel , Urology, to remove the foley catheter today.

## 2017-11-04 NOTE — Progress Notes (Signed)
  Speech Language Pathology Treatment: Dysphagia  Patient Details Name: Laurie Moreno MRN: [id number] DOB: 06-Oct-1950 Today's Date: 11/04/2017 Time: 7564-3329 SLP Time Calculation (min) (ACUTE ONLY): 12 min  Assessment / Plan / Recommendation Clinical Impression  Pt with much improved mental status compared to initial evaluation.  Pt today reports she does not wear lower dentures.  She was observed to consume graham cracker and water.  No indication of aspiration although pt did NOT pass the 3 ounce water test.  Uncertain if she did not comprehend 3 ounce water task - Spoke to urology who encouraged her diet to be advanced to regular/thin diet.  Pt eats a regular/thin diet prior to admit.   SLP reinforced need for use of incentive spirometer and clinical reasoning using teach back.  SLP to sign off.      HPI HPI: 67 yo female adm to [facility] for pyelonephritis - pt is s/p left adrenalectomy and stent removal also.  PMH + for left chronic pyelonephritis, COPD, bipolar, HCAP, essential tremor, decreased toelrance of anesthesia.  Pt noted to be choking with liquids/soup this am with decrease oxygen saturation noted.  Swallow eval ordered.  Pt has been seen previously by SLP - April 2019 with functional swallow noted.  She was in hospital 09/2017 with respiratory failure and required ETT.  Pt admits to premorbid h/o "choking" on liquids at times ,denies reflux nor having prior swallow test completed in xray.  She did not have great awareness to her aspiration event with soup this am.       SLP Plan  All goals met       Recommendations  Diet recommendations: Regular Liquids provided via: Straw;Cup Medication Administration: Whole meds with liquid Supervision: Patient able to self feed;Staff to assist with self feeding;Full supervision/cueing for compensatory strategies Compensations: Minimize environmental distractions;Slow rate;Small sips/bites Postural Changes and/or Swallow Maneuvers: Seated  upright 90 degrees;Upright 30-60 min after meal                Oral Care Recommendations: Oral care QID Follow up Recommendations: (TBD) SLP Visit Diagnosis: Dysphagia, unspecified (R13.10) Plan: All goals met       GO                Macario Golds 11/04/2017, 11:03 AM   Luanna Salk, [place] [facility] SLP [phone]

## 2017-11-04 NOTE — Progress Notes (Signed)
Physical Therapy Treatment Patient Details Name: Laurie Moreno MRN: [id number] DOB: 08-19-1950 Today's Date: 11/04/2017    History of Present Illness Pt is s/p nephrectomy/adrenalectomy and L stent removal due to pylonephritis. H/o bipolar and essential tremor     PT Comments    Progressing with mobility. Pt remained on 6L [place] O2 during session. She walked ~60 feet with a RW. Bowel incontinence requiring total assist for clean-up/hygiene. Will continue to follow and progress activity as tolerated.     Follow Up Recommendations  SNF vs Home health PT;Supervision/Assistance - 24 hour(depending on continued progress and assist available at ALF)     Equipment Recommendations  None recommended by PT    Recommendations for Other Services       Precautions / Restrictions Precautions Precautions: Fall Precaution Comments: monitor O2 sats. incontinence Restrictions Weight Bearing Restrictions: No    Mobility  Bed Mobility               General bed mobility comments: oob in recliner  Transfers Overall transfer level: Needs assistance Equipment used: Rolling walker (2 wheeled) Transfers: Sit to/from Stand Sit to Stand: Min assist Stand pivot transfers: Min assist       General transfer comment: VCs safety, hand placement. Assist to rise, stabilize, control descent. Stand pivot, recliner<>bsc with RW  Ambulation/Gait Ambulation/Gait assistance: Min assist Gait Distance (Feet): 60 Feet Assistive device: Rolling walker (2 wheeled) Gait Pattern/deviations: Step-through pattern;Decreased stride length     General Gait Details: Assist to steady througout distance. Remained on 6L [place] O2. Unable to get a reading while ambulating. Pt denied dyspnea.    Stairs             Wheelchair Mobility    Modified Rankin (Stroke Patients Only)       Balance Overall balance assessment: Needs assistance         Standing balance support: Bilateral upper extremity  supported Standing balance-Leahy Scale: Poor                              Cognition Arousal/Alertness: Awake/alert Behavior During Therapy: WFL for tasks assessed/performed Overall Cognitive Status: No family/caregiver present to determine baseline cognitive functioning Area of Impairment: Problem solving                             Problem Solving: Slow processing;Requires verbal cues        Exercises      General Comments        Pertinent Vitals/Pain Pain Assessment: No/denies pain    Home Living                 Additional Comments: from ALF. Performed ADLs and toileting independently    Prior Function            PT Goals (current goals can now be found in the care plan section) Progress towards PT goals: Progressing toward goals    Frequency    Min 3X/week      PT Plan Frequency needs to be updated;Current plan remains appropriate    Co-evaluation              AM-PAC PT "6 Clicks" Daily Activity  Outcome Measure  Difficulty turning over in bed (including adjusting bedclothes, sheets and blankets)?: A Lot Difficulty moving from lying on back to sitting on the side of the bed? : A Lot Difficulty sitting down  on and standing up from a chair with arms (e.g., wheelchair, bedside commode, etc,.)?: Unable Help needed moving to and from a bed to chair (including a wheelchair)?: A Little Help needed walking in hospital room?: A Little Help needed climbing 3-5 steps with a railing? : A Lot 6 Click Score: 13    End of Session Equipment Utilized During Treatment: Oxygen;Gait belt Activity Tolerance: Patient tolerated treatment well Patient left: in chair;with call bell/phone within reach;with chair alarm set   PT Visit Diagnosis: Muscle weakness (generalized) (M62.81);Unsteadiness on feet (R26.81)     Time: 9629-5284 PT Time Calculation (min) (ACUTE ONLY): 27 min  Charges:  $Gait Training: 8-22 mins $Therapeutic  Activity: 8-22 mins                    G Codes:          Weston Anna, MPT Pager: [phone]

## 2017-11-04 NOTE — Progress Notes (Signed)
Urology Progress Note   3 Days Post-Op  Subjective/Interval Events: Treating for HAP - on Cefepime Speech following - diet carefully advanced Moving bowels Working with PT and starting to move better Pain has been reasonably well controlled - Oxycodone seemed to strong and caused severe somnolence. No fevers in past 24 hours.  Objective: Vital signs in last 24 hours: Temp:  [98.3 F (36.8 C)-100.5 F (38.1 C)] 98.3 F (36.8 C) (07/04 0800) Pulse Rate:  [73-96] 73 (07/04 0800) Resp:  [14-27] 21 (07/04 0800) BP: (92-136)/(47-91) 117/69 (07/04 0800) SpO2:  [87 %-95 %] 93 % (07/04 0800)  Intake/Output from previous day: 07/03 0701 - 07/04 0700 In: 1030.7 [P.O.:240; I.V.:64; IV Piggyback:726.7] Out: 3885 [Urine:3885] Intake/Output this shift: Total I/O In: 518.2 [P.O.:480; I.V.:38.2] Out: 1225 [Urine:1225]  Physical Exam:  General: Alert and oriented Non-labored breathing - on 6L [place] Abdomen: Soft, appropriately tender. Incisions are c/d/i GU: Foley in place draining clear yellow urine  Ext: NT, No erythema  Lab Results: Recent Labs    11/02/17 0320 11/03/17 0352  HGB 10.4* 10.0*  HCT 33.6* 31.8*   BMET Recent Labs    11/03/17 0352 11/04/17 0334  NA 144 143  K 4.4 4.2  CL 103 98  CO2 30 36*  GLUCOSE 155* 164*  BUN 16 21  CREATININE 0.71 0.78  CALCIUM 8.5* 8.6*     Studies/Results: Dg Chest 2 View  Result Date: 11/02/2017 CLINICAL DATA:  Weakness and shortness of breath. EXAM: CHEST - 2 VIEW COMPARISON:  Chest x-ray dated Sep 08, 2017. FINDINGS: Interval removal of the endotracheal tube. Worsened pulmonary vascular congestion and increasing small bilateral pleural effusions. Bibasilar atelectasis. No pneumothorax. No acute osseous abnormality. IMPRESSION: Worsening pulmonary vascular congestion and increasing small bilateral pleural effusions. Electronically Signed   By: Titus Dubin M.D.   On: 11/02/2017 14:40   Dg Chest Port 1 View  Result Date:  11/03/2017 CLINICAL DATA:  Shortness of breath EXAM: PORTABLE CHEST 1 VIEW COMPARISON:  November 02, 2017 FINDINGS: There is consolidation in both medial lung bases. Lungs elsewhere are clear. Heart is upper normal in size with pulmonary vascularity normal. No adenopathy. There is upper thoracic dextroscoliosis. IMPRESSION: Medial consolidation in both lung bases. Suspect pneumonia in these areas. Lungs elsewhere clear. Stable cardiac silhouette. Electronically Signed   By: Lowella Grip III M.D.   On: 11/03/2017 09:00    Assessment/Plan:  67 y.o. female s/p hand assist lap left nephrectomy/adrenalectomy. She is making progress on all fronts.  HAP - continue cefepime, oral agent and duration per hospitalist, their input appreciated. Hypervolemia - BID lasix, have adjusted timing so that it is given early in the afternoon to minimize nocturia. Hypoxia - weaning O2, continue with IS and treatments as above. Dysphagia - followed by Speech with plans to challenge for thin liquids, okay to advance to regular diet since she's moving her bowels, appreciate their help.  D/c foley today Transfer to Med-surg floor D/c oxycodone given somnolence.  Started IV Tylenol x 24 hours and then tramadol. Resume home meds. Repeat CXR tomorrow AM.    Dispo: Med-surg   LOS: 3 days   Ardis Hughs 11/04/2017, 10:49 AM

## 2017-11-05 DIAGNOSIS — N12 Tubulo-interstitial nephritis, not specified as acute or chronic: Secondary | ICD-10-CM

## 2017-11-05 LAB — BASIC METABOLIC PANEL
ANION GAP: 11 (ref 5–15)
BUN: 29 mg/dL — ABNORMAL HIGH (ref 8–23)
CO2: 36 mmol/L — AB (ref 22–32)
Calcium: 8.6 mg/dL — ABNORMAL LOW (ref 8.9–10.3)
Chloride: 97 mmol/L — ABNORMAL LOW (ref 98–111)
Creatinine, Ser: 0.74 mg/dL (ref 0.44–1.00)
Glucose, Bld: 146 mg/dL — ABNORMAL HIGH (ref 70–99)
POTASSIUM: 3.8 mmol/L (ref 3.5–5.1)
Sodium: 144 mmol/L (ref 135–145)

## 2017-11-05 LAB — CBC
HEMATOCRIT: 31.4 % — AB (ref 36.0–46.0)
HEMOGLOBIN: 10 g/dL — AB (ref 12.0–15.0)
MCH: 30 pg (ref 26.0–34.0)
MCHC: 31.8 g/dL (ref 30.0–36.0)
MCV: 94.3 fL (ref 78.0–100.0)
Platelets: 118 10*3/uL — ABNORMAL LOW (ref 150–400)
RBC: 3.33 MIL/uL — AB (ref 3.87–5.11)
RDW: 13.8 % (ref 11.5–15.5)
WBC: 3.9 10*3/uL — ABNORMAL LOW (ref 4.0–10.5)

## 2017-11-05 LAB — GLUCOSE, CAPILLARY
GLUCOSE-CAPILLARY: 120 mg/dL — AB (ref 70–99)
GLUCOSE-CAPILLARY: 161 mg/dL — AB (ref 70–99)
GLUCOSE-CAPILLARY: 173 mg/dL — AB (ref 70–99)
GLUCOSE-CAPILLARY: 186 mg/dL — AB (ref 70–99)
Glucose-Capillary: 132 mg/dL — ABNORMAL HIGH (ref 70–99)
Glucose-Capillary: 142 mg/dL — ABNORMAL HIGH (ref 70–99)

## 2017-11-05 MED ORDER — ACETAMINOPHEN 325 MG PO TABS
650.0000 mg | ORAL_TABLET | Freq: Four times a day (QID) | ORAL | Status: DC | PRN
  Administered 2017-11-05 – 2017-11-08 (×7): 650 mg via ORAL
  Filled 2017-11-05 (×7): qty 2

## 2017-11-05 MED ORDER — TRAMADOL HCL 50 MG PO TABS: 50.0000 mg | ORAL_TABLET | Freq: Four times a day (QID) | ORAL | 0 refills | Status: AC | PRN

## 2017-11-05 MED ORDER — LEVOFLOXACIN 750 MG PO TABS
750.0000 mg | ORAL_TABLET | Freq: Every day | ORAL | Status: DC
  Administered 2017-11-05 – 2017-11-09 (×5): 750 mg via ORAL
  Filled 2017-11-05 (×6): qty 1

## 2017-11-05 MED ORDER — LEVOFLOXACIN 750 MG PO TABS: 750.0000 mg | ORAL_TABLET | Freq: Every day | ORAL | 0 refills | Status: DC

## 2017-11-05 NOTE — Progress Notes (Signed)
Urology Progress Note   4 Days Post-Op  Subjective/Interval Events: AFVSS UOP adequate with Foley removed Treating for HAP - deescalated to levofloxacin O2 requirement improving with diuresis, Lasix now discontinued Moving bowels Working with PT, improving - was OOB to chair most of yesterday. Pain has been reasonably well controlled  Objective: Vital signs in last 24 hours: Temp:  [97.9 F (36.6 C)-98.5 F (36.9 C)] 98.5 F (36.9 C) (07/05 0557) Pulse Rate:  [66-80] 66 (07/05 0557) Resp:  [17-22] 18 (07/05 0557) BP: (96-124)/(54-65) 124/65 (07/05 0557) SpO2:  [92 %-95 %] 93 % (07/05 0557)  Intake/Output from previous day: 07/04 0701 - 07/05 0700 In: 1494.9 [P.O.:960; I.V.:78.2; IV Piggyback:356.7] Out: 2225 [Urine:2225] Intake/Output this shift: No intake/output data recorded.  Physical Exam:  General: Alert and oriented Non-labored breathing - on 2L [place] Abdomen: Soft, appropriately tender. Incisions are c/d/i GU: Purewick in place while recumbent, urine clear yellow Ext: NT, No erythema  Lab Results: Recent Labs    11/03/17 0352 11/05/17 0540  HGB 10.0* 10.0*  HCT 31.8* 31.4*   BMET Recent Labs    11/04/17 0334 11/05/17 0540  NA 143 144  K 4.2 3.8  CL 98 97*  CO2 36* 36*  GLUCOSE 164* 146*  BUN 21 29*  CREATININE 0.78 0.74  CALCIUM 8.6* 8.6*     Studies/Results: No results found.  Assessment/Plan:  67 y.o. female s/p hand assist lap left nephrectomy/adrenalectomy.  - Pain well controlled without narcotics, PO Tylenol available PRN  -Continue PO levofloxacin for HAP through 7/10 per medicine -Lasix discontinued and not recommended upon discharge per medicine -Weaning O2, continue with OOB, IS - Continues to work with PT who anticipates discharge with [facility] versus SNF pending progress - Tolerating regular diet, SLP signed off  Dispo: Med-surg   LOS: 4 days   Case Rob Bunting 11/05/2017, 8:05 AM

## 2017-11-05 NOTE — Clinical Social Work Note (Addendum)
Clinical Social Work Assessment  Patient Details  Name: Laurie Moreno MRN: [id number] Date of Birth: 02/13/1951  Date of referral:  11/05/17               Reason for consult:  Facility Placement                Permission sought to share information with:  Case Manager, Customer service manager, Family Supports Permission granted to share information::  Yes, Verbal Permission Granted  Name::     Associate Professor::  SNF  Relationship::  sister  Contact Information:     Housing/Transportation Living arrangements for the past 2 months:  [place] of Information:  Patient Patient Interpreter Needed:  None Criminal Activity/Legal Involvement Pertinent to Current Situation/Hospitalization:  No - Comment as needed Significant Relationships:  Siblings, [employer] Lives with:  Facility Resident Do you feel safe going back to the place where you live?  Yes Need for family participation in patient care:  Yes (Comment)  Care giving concerns:  No care giving concerns at the time of assessment.    Social Worker assessment / plan:  LCSW consulted for facility placement.   Patient admitted for SOB.   Patient is a resident at [facility] in [place]. Patient states that she has been a resident at [facility] for a little over a year. Patient requires assistance with ADLs and uses a walker at baseline. Patient is able to feed herself.   Patient is unclear of plan. Patient states she would prefer to go to her sisters, but she doesn't know if that is an option. Patient asked LCSW to fax her out to [facility] to see if she could get a bed there. Patient is tearful at the time of assessment.   PLAN: TBD. LCSW will fax patient out to SNF.   Employment status:  Retired Nurse, adult PT Recommendations:  [place], Home with [place] / Referral to community resources:     Patient/Family's Response to care:   Patient thankful for LCSW visit and coordination with dc planning.   Patient/Family's Understanding of and Emotional Response to Diagnosis, Current Treatment, and Prognosis:  Patient appears to have a clear understanding of diagnosis and treatment plan. Patient is unsure of dc plan at the time of assessment. Patient is agreeable to SNF, but prefers to go home or with sister.   Emotional Assessment Appearance:  Appears stated age Attitude/Demeanor/Rapport:    Affect (typically observed):  Sad, Tearful/Crying Orientation:  Oriented to Self, Oriented to Place, Oriented to  Time, Oriented to Situation Alcohol / Substance use:  Not Applicable Psych involvement (Current and /or in the community):  No (Comment)  Discharge Needs  Concerns to be addressed:  No discharge needs identified Readmission within the last 30 days:  No Current discharge risk:  None Barriers to Discharge:  Continued Medical Work up   [employer], LCSW 11/05/2017, 11:34 AM

## 2017-11-05 NOTE — Progress Notes (Signed)
Physical Therapy Treatment Patient Details Name: Laurie Moreno MRN: [id number] DOB: 08/10/1950 Today's Date: 11/05/2017    SATURATION QUALIFICATIONS: (This note is used to comply with regulatory documentation for home oxygen)  Patient Saturations on Room Air at Rest = 87%  Patient Saturations on [employer] while Ambulating = n/a  Patient Saturations on 3 Liters of oxygen while Ambulating = 94%   History of Present Illness Pt is s/p nephrectomy/adrenalectomy and L stent removal due to pylonephritis. H/o bipolar and essential tremor     PT Comments    Progressing with mobility.    Follow Up Recommendations  SNF(will likely require increased assistance if she returns to ALF)     Equipment Recommendations  None recommended by PT    Recommendations for Other Services       Precautions / Restrictions Precautions Precautions: Fall Precaution Comments: monitor O2 sats. incontinence Restrictions Weight Bearing Restrictions: No    Mobility  Bed Mobility Overal bed mobility: Needs Assistance Bed Mobility: Supine to Sit     Supine to sit: Min assist;HOB elevated     General bed mobility comments: Assist for trunk. Increased time.   Transfers Overall transfer level: Needs assistance Equipment used: Rolling walker (2 wheeled) Transfers: Sit to/from Stand Sit to Stand: Min assist         General transfer comment: VCs safety, hand placement. Assist to rise, stabilize, control descent.   Ambulation/Gait Ambulation/Gait assistance: Min assist Gait Distance (Feet): 125 Feet Assistive device: Rolling walker (2 wheeled) Gait Pattern/deviations: Step-through pattern;Decreased stride length     General Gait Details: Assist to steady througout distance. Remained on 3L [place] O2. O2 sat 93%.    Stairs             Wheelchair Mobility    Modified Rankin (Stroke Patients Only)       Balance Overall balance assessment: Needs assistance         Standing balance  support: Bilateral upper extremity supported Standing balance-Leahy Scale: Poor                              Cognition Arousal/Alertness: Awake/alert Behavior During Therapy: WFL for tasks assessed/performed Overall Cognitive Status: No family/caregiver present to determine baseline cognitive functioning                               Problem Solving: Slow processing;Requires verbal cues        Exercises      General Comments        Pertinent Vitals/Pain Pain Assessment: Faces Faces Pain Scale: Hurts even more Pain Location: abdomen - s/p left kidney removed Pain Descriptors / Indicators: Discomfort;Grimacing;Crying Pain Intervention(s): Monitored during session;Repositioned    Home Living                      Prior Function            PT Goals (current goals can now be found in the care plan section) Progress towards PT goals: Progressing toward goals    Frequency    Min 3X/week      PT Plan Current plan remains appropriate    Co-evaluation              AM-PAC PT "6 Clicks" Daily Activity  Outcome Measure  Difficulty turning over in bed (including adjusting bedclothes, sheets and blankets)?: A Lot Difficulty moving  from lying on back to sitting on the side of the bed? : A Lot Difficulty sitting down on and standing up from a chair with arms (e.g., wheelchair, bedside commode, etc,.)?: Unable Help needed moving to and from a bed to chair (including a wheelchair)?: A Little Help needed walking in hospital room?: A Little Help needed climbing 3-5 steps with a railing? : A Lot 6 Click Score: 13    End of Session Equipment Utilized During Treatment: Oxygen;Gait belt Activity Tolerance: Patient tolerated treatment well Patient left: in chair;with call bell/phone within reach Nurse Communication: (no chair alarm pad available-NT aware and stated pt does well using call bell) PT Visit Diagnosis: Muscle weakness  (generalized) (M62.81);Unsteadiness on feet (R26.81)     Time: 8343-7357 PT Time Calculation (min) (ACUTE ONLY): 22 min  Charges:  $Gait Training: 8-22 mins                    G Codes:          Weston Anna, MPT Pager: [phone]

## 2017-11-05 NOTE — Discharge Instructions (Signed)
·   Be sure to complete your antibiotics   Activity:  You are encouraged to ambulate frequently (about every hour during waking hours) to help prevent blood clots from forming in your legs or lungs.  However, you should not engage in any heavy lifting (> 10-15 lbs), strenuous activity, or straining.   Diet: You should advance your diet as instructed by your physician.  It will be normal to have some bloating, nausea, and abdominal discomfort intermittently.   Prescriptions:  You will be provided a prescription for pain medication to take as needed.  If your pain is not severe enough to require the prescription pain medication, you may take extra strength Tylenol instead which will have less side effects.  You should also take a prescribed stool softener to avoid straining with bowel movements as the prescription pain medication may constipate you.   Incisions: You may remove your dressing bandages 48 hours after surgery if not removed in the hospital.  You will either have some small staples or special tissue glue at each of the incision sites. Once the bandages are removed (if present), the incisions may stay open to air.  You may start showering (but not soaking or bathing in water) the 2nd day after surgery and the incisions simply need to be patted dry after the shower.  No additional care is needed.   What to call us about: You should call the office [phone]) if you develop fever > 101 or develop persistent vomiting. Activity:  You are encouraged to ambulate frequently (about every hour during waking hours) to help prevent blood clots from forming in your legs or lungs.  However, you should not engage in any heavy lifting (> 10-15 lbs), strenuous activity, or straining.

## 2017-11-05 NOTE — Progress Notes (Signed)
Occupational Therapy Treatment Patient Details Name: Laurie Moreno MRN: [id number] DOB: May 29, 1950 Today's Date: 11/05/2017    History of present illness Pt is s/p nephrectomy/adrenalectomy and L stent removal due to pylonephritis. H/o bipolar and essential tremor    OT comments  Pt participated in ADL and ambulated to bathroom.  Sister was present at beginning of session. Pt is agreeable to SNF for rehab and hopes to go to her sisters afterwards  Follow Up Recommendations  SNF    Equipment Recommendations  3 in 1 bedside commode    Recommendations for Other Services      Precautions / Restrictions Precautions Precautions: Fall Precaution Comments: monitor O2 sats. incontinence Restrictions Weight Bearing Restrictions: No       Mobility Bed Mobility           Sit to supine: Mod assist;HOB elevated   General bed mobility comments: assist for legs  Transfers   Equipment used: Rolling walker (2 wheeled)   Sit to Stand: Min guard Stand pivot transfers: Min guard       General transfer comment: cues for UE placement and min guard for safety    Balance                                           ADL either performed or assessed with clinical judgement   ADL Overall ADL's : Needs assistance/impaired         Upper Body Bathing: Minimal assistance;Sitting   Lower Body Bathing: Maximal assistance;Sit to/from stand   Upper Body Dressing : Minimal assistance;Sitting       Toilet Transfer: Min guard;Ambulation;BSC;RW   Toileting- Clothing Manipulation and Hygiene: Maximal assistance;Sit to/from stand         General ADL Comments: ambulated to bathroom and performed toilet transfer.  Sat at sink for adl.  Assisted with hygiene after using commode and pt returned to bed. RN reports that she was up walking 3xs today     Vision       Perception     Praxis      Cognition Arousal/Alertness: Awake/alert Behavior During Therapy: WFL for  tasks assessed/performed                                   General Comments: improved cognition today; cues for safety        Exercises     Shoulder Instructions       General Comments      Pertinent Vitals/ Pain       Pain Assessment: No/denies pain Faces Pain Scale: Hurts little more Pain Location: abdomen - s/p left kidney removed Pain Descriptors / Indicators: Discomfort;Grimacing;Crying Pain Intervention(s): Limited activity within patient's tolerance;Monitored during session;Repositioned  Home Living                                          Prior Functioning/Environment              Frequency  Min 2X/week        Progress Toward Goals  OT Goals(current goals can now be found in the care plan section)  Progress towards OT goals: Progressing toward goals     Plan  Co-evaluation                 AM-PAC PT "6 Clicks" Daily Activity     Outcome Measure   Help from another person eating meals?: A Little Help from another person taking care of personal grooming?: A Little Help from another person toileting, which includes using toliet, bedpan, or urinal?: A Little Help from another person bathing (including washing, rinsing, drying)?: A Little Help from another person to put on and taking off regular upper body clothing?: A Little Help from another person to put on and taking off regular lower body clothing?: Total 6 Click Score: 16    End of Session    OT Visit Diagnosis: Muscle weakness (generalized) (M62.81)   Activity Tolerance No increased pain   Patient Left in bed;with call bell/phone within reach;with bed alarm set   Nurse Communication          Time: 2233-6122 OT Time Calculation (min): 29 min  Charges: OT General Charges $OT Visit: 1 Visit OT Treatments $Self Care/Home Management : 23-37 mins  Lesle Chris, OTR/L [phone] 11/05/2017   Laurie Moreno 11/05/2017, 2:25  PM

## 2017-11-05 NOTE — Progress Notes (Signed)
[facility] CONSULT PROGRESS NOTE    Progress Note  Laurie Moreno  PTW:[id number] DOB: 1950/12/08 DOA: 11/01/2017 PCP: Salvadore Dom, MD     Brief Narrative:   Laurie Moreno is an 67 y.o. 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  She was given IV Lasix and her saturations improved  Assessment/Plan:   Acute encephalopathy due to acute  respiratory failure with hypoxia in the setting of fluid overload: Her diuresis has slowed down, his creatinine has remained at baseline, she is not fluid overloaded on physical exam. 2D echo showed preserved EF with grade 1 diastolic heart failure She relates she does not take Lasix at home I will recommend NOT sending her home on Lasix.  Chronic pyelonephritis status post left nephrectomy: Continue current management per urology. Status post surgical intervention  COPD: Not wheezing on physical exam continue albuterol she does not use oxygen at home, will continue to try to wean off her oxygen.  Bipolar 1 disorder ([place]): Continue Depakote vomiting.   Hypothyroidism: Continue Depakote.  New healthcare associated pneumonia: She has remained afebrile for more than 36 hours, will de-escalate her antibiotic regimen to Oral Levaquin 500 mg p.o. daily for 6 additional days which she can continue as an outpatient.  Will sign off.   Thrombocytopenia: Likely multifactorial due to infection and recent surgery now improving.  DVT prophylaxis: lovenox Family Communication:none Disposition Plan/Barrier to D/C: From internal medicine standpoint she is stable for discharge.  Will sign off Code Status:     Code Status Orders  (From admission,  onward)        Start     Ordered   11/01/17 0956  Full code  Continuous     11/01/17 0959    Code Status History    Date Active Date Inactive Code Status Order ID Comments User Context   09/08/2017 0324 09/13/2017 1608 Full Code [id number]  Elwyn Reach, MD Inpatient   08/21/2017 2004 08/25/2017 1919 Full Code [id number]  Vianne Bulls, MD ED   08/06/2017 0410 08/08/2017 2034 Full Code [id number]  Oswald Hillock, MD Inpatient        IV Access:    Peripheral IV   Procedures and diagnostic studies:   Dg Chest Port 1 View  Result Date: 11/03/2017 CLINICAL DATA:  Shortness of breath EXAM: PORTABLE CHEST 1 VIEW COMPARISON:  November 02, 2017 FINDINGS: There is consolidation in both medial lung bases. Lungs elsewhere are clear. Heart is upper normal in size with pulmonary vascularity normal. No adenopathy. There is upper thoracic dextroscoliosis. IMPRESSION: Medial consolidation in both lung bases. Suspect pneumonia in these areas. Lungs elsewhere clear. Stable cardiac silhouette. Electronically Signed   By: Lowella Grip III M.D.   On: 11/03/2017 09:00     Medical Consultants:    None.  Anti-Infectives:   Oral Levaquin  Subjective:    Laurie Moreno she has no new complaints, she relates she feels much better than yesterday  Objective:    Vitals:   11/04/17 1138 11/04/17 1346 11/04/17 2018 11/05/17 0557  BP: (!) 110/54 96/64 105/60 124/65  Pulse: 80 78 80 66  Resp: (!) 22 17 20 18   Temp:  97.9 F (36.6 C) 98.4 F (36.9 C) 98.5 F (36.9 C)  TempSrc:  Oral Oral Oral  SpO2: 95% 93% 92% 93%  Weight:      Height:        Intake/Output Summary (Last 24 hours) at 11/05/2017 0743 Last data filed at 11/05/2017 0700 Gross per 24 hour  Intake 1494.86 ml  Output 2225 ml  Net -730.14 ml   Filed Weights   11/01/17 0539 11/01/17 1217  Weight: 87.5 kg (193 lb) 94.3 kg (207 lb 14.3 oz)    Exam: General exam: In no acute distress. Respiratory system: Good air movement and  clear to auscultation. Cardiovascular system: S1 & S2 heard, RRR.  negative JVD. Gastrointestinal system: Abdomen is nondistended, soft and nontender.  Central nervous system: Alert and oriented. No focal neurological deficits. Extremities: No pedal edema. Skin: No rashes, lesions or ulcers Psychiatry: Judgement and insight appear normal. Mood & affect appropriate.    Data Reviewed:    Labs: Basic Metabolic Panel: Recent Labs  Lab 11/01/17 1031 11/02/17 0320 11/03/17 0352 11/04/17 0334 11/05/17 0540  NA 142 141 144 143 144  K 4.9 4.7 4.4 4.2 3.8  CL 109 105 103 98 97*  CO2 24 30 30  36* 36*  GLUCOSE 165* 158* 155* 164* 146*  BUN 17 14 16 21  29*  CREATININE 0.67 0.76 0.71 0.78 0.74  CALCIUM 8.2* 8.1* 8.5* 8.6* 8.6*   GFR Estimated Creatinine Clearance: 75.6 mL/min (by C-G formula based on SCr of 0.74 mg/dL). Liver Function Tests: No results for input(s): AST, ALT, ALKPHOS, BILITOT, PROT, ALBUMIN in the last 168 hours. No results for input(s): LIPASE, AMYLASE in the last 168 hours. No results for input(s): AMMONIA in the last 168 hours. Coagulation profile No results for input(s): INR, PROTIME in the last 168 hours.  CBC: Recent Labs  Lab 11/01/17 1031 11/02/17 0320 11/03/17 0352 11/05/17 0540  WBC  --   --  7.6 3.9*  NEUTROABS  --   --  5.1  --   HGB 12.5 10.4* 10.0* 10.0*  HCT 39.0 33.6* 31.8* 31.4*  MCV  --   --  95.2 94.3  PLT  --   --  96* 118*   Cardiac Enzymes: No results for input(s): CKTOTAL, CKMB, CKMBINDEX, TROPONINI in the last 168 hours. BNP (last 3 results) No results for input(s): PROBNP in the last 8760 hours. CBG: Recent Labs  Lab 11/04/17 1153 11/04/17 1651 11/04/17 1948 11/04/17 2323 11/05/17 0336  GLUCAP 202* 120* 242* 164* 132*   D-Dimer: No results for input(s): DDIMER in the last 72 hours. Hgb A1c: No results for input(s): HGBA1C in the last 72 hours. Lipid Profile: No results for input(s): CHOL, HDL, LDLCALC, TRIG, CHOLHDL,  LDLDIRECT in the last 72 hours. Thyroid function studies: No results for input(s): TSH, T4TOTAL, T3FREE, THYROIDAB in the last 72 hours.  Invalid input(s): FREET3 Anemia work up: No results for input(s): VITAMINB12, FOLATE, FERRITIN, TIBC, IRON, RETICCTPCT in the last 72 hours. Sepsis Labs: Recent Labs  Lab 11/03/17 0352 11/05/17 0540  WBC 7.6 3.9*   Microbiology Recent Results (from the past 240 hour(s))  MRSA PCR Screening     Status: None   Collection Time: 11/03/17 12:14 PM  Result Value Ref Range Status   MRSA by PCR NEGATIVE NEGATIVE Final    Comment:        The GeneXpert MRSA Assay (FDA approved for NASAL specimens only), is one component of a comprehensive MRSA colonization surveillance program. It is not intended to diagnose MRSA infection nor to guide or monitor treatment  for MRSA infections. Performed at [facility], [place] [address] [place], [place] [address]   Culture, blood (routine x 2) Call MD if unable to obtain prior to antibiotics being given     Status: None (Preliminary result)   Collection Time: 11/03/17 12:17 PM  Result Value Ref Range Status   Specimen Description   Final    BLOOD RIGHT ANTECUBITAL Performed at [facility] [address], [place], [place] [address]    Special Requests   Final    BOTTLES DRAWN AEROBIC AND ANAEROBIC Blood Culture adequate volume Performed at [facility] [address] [place], [place] [address]    Culture   Final    NO GROWTH 1 DAY Performed at [facility], [place] [address], [place], [place] [address]    Report Status PENDING  Incomplete  Culture, blood (routine x 2) Call MD if unable to obtain prior to antibiotics being given     Status: None (Preliminary result)   Collection Time: 11/03/17 12:17 PM  Result Value Ref Range Status   Specimen Description   Final    BLOOD RIGHT HAND Performed at [facility] [address] [place], [place] [address]    Special Requests   Final    BOTTLES DRAWN AEROBIC AND ANAEROBIC Blood Culture adequate volume Performed at [facility] [address], [place], [place] [address]    Culture   Final    NO GROWTH 1 DAY Performed at [facility], [place] [address] [place], [place] [address]    Report Status PENDING  Incomplete     Medications:   . atorvastatin  40 mg Oral QHS  . carvedilol  3.125 mg Oral BID WC  . clonazePAM  0.5 mg Oral BID  . divalproex  250 mg Oral TID  . docusate sodium  100 mg Oral BID  . enoxaparin (LOVENOX) injection  40 mg Subcutaneous Q24H  . escitalopram  10 mg Oral q morning - 10a  . gabapentin  300 mg Oral QID  . insulin aspart  0-15 Units Subcutaneous Q4H  . levothyroxine  125 mcg Oral QAC breakfast  . mouth rinse  15 mL Mouth Rinse BID  . primidone  50 mg Oral QHS  . senna  1 tablet Oral BID   Continuous Infusions: . sodium chloride Stopped (11/04/17 0949)  . ceFEPime (MAXIPIME) IV 1 g (11/05/17 5188)     LOS: 4 days   [facility] Pager [phone]  *Please refer to [email or web address], password [email or web address] to get updated schedule on who will round on this patient, as hospitalists switch teams weekly. If 7PM-7AM, please contact night-coverage at [email or web address], password [email or web address] for any overnight needs.  11/05/2017, 7:43 AM

## 2017-11-06 LAB — BASIC METABOLIC PANEL
Anion gap: 11 (ref 5–15)
BUN: 25 mg/dL — AB (ref 8–23)
CO2: 31 mmol/L (ref 22–32)
CREATININE: 0.77 mg/dL (ref 0.44–1.00)
Calcium: 9 mg/dL (ref 8.9–10.3)
Chloride: 100 mmol/L (ref 98–111)
GFR calc Af Amer: >60 mL/min (ref >60)
GFR calc non Af Amer: >60 mL/min (ref >60)
GLUCOSE: 172 mg/dL — AB (ref 70–99)
POTASSIUM: 4.1 mmol/L (ref 3.5–5.1)
SODIUM: 142 mmol/L (ref 135–145)

## 2017-11-06 LAB — GLUCOSE, CAPILLARY
GLUCOSE-CAPILLARY: 105 mg/dL — AB (ref 70–99)
GLUCOSE-CAPILLARY: 168 mg/dL — AB (ref 70–99)
GLUCOSE-CAPILLARY: 171 mg/dL — AB (ref 70–99)
Glucose-Capillary: 166 mg/dL — ABNORMAL HIGH (ref 70–99)
Glucose-Capillary: 150 mg/dL — ABNORMAL HIGH (ref 70–99)
Glucose-Capillary: 224 mg/dL — ABNORMAL HIGH (ref 70–99)

## 2017-11-07 LAB — GLUCOSE, CAPILLARY
GLUCOSE-CAPILLARY: 110 mg/dL — AB (ref 70–99)
GLUCOSE-CAPILLARY: 141 mg/dL — AB (ref 70–99)
Glucose-Capillary: 136 mg/dL — ABNORMAL HIGH (ref 70–99)
Glucose-Capillary: 139 mg/dL — ABNORMAL HIGH (ref 70–99)
Glucose-Capillary: 141 mg/dL — ABNORMAL HIGH (ref 70–99)
Glucose-Capillary: 150 mg/dL — ABNORMAL HIGH (ref 70–99)
Glucose-Capillary: 159 mg/dL — ABNORMAL HIGH (ref 70–99)

## 2017-11-07 NOTE — Progress Notes (Signed)
6 Days Post-Op  Subjective: She is doing well 6 days post left HAL Nephrectomy for a non-functional kidney.  She is awaiting SNF and has refused to walk today. ROS:  Review of Systems  All other systems reviewed and are negative.   Anti-infectives: Anti-infectives (From admission, onward)   Start     Dose/Rate Route Frequency Ordered Stop   11/06/17 0000  levofloxacin (LEVAQUIN) 750 MG tablet     750 mg Oral Daily 11/05/17 1402     11/05/17 1000  levofloxacin (LEVAQUIN) tablet 750 mg     750 mg Oral Daily 11/05/17 0748     11/04/17 1400  vancomycin (VANCOCIN) IVPB 1000 mg/200 mL premix  Status:  Discontinued     1,000 mg 200 mL/hr over 60 Minutes Intravenous Every 24 hours 11/03/17 1213 11/04/17 0826   11/03/17 1400  ceFEPIme (MAXIPIME) 1 g in sodium chloride 0.9 % 100 mL IVPB  Status:  Discontinued     1 g 200 mL/hr over 30 Minutes Intravenous Every 8 hours 11/03/17 1145 11/05/17 0748   11/03/17 1300  vancomycin (VANCOCIN) 2,000 mg in sodium chloride 0.9 % 500 mL IVPB     2,000 mg 250 mL/hr over 120 Minutes Intravenous  Once 11/03/17 1213 11/03/17 1532   11/02/17 1700  meropenem (MERREM) 1 g in sodium chloride 0.9 % 100 mL IVPB  Status:  Discontinued     1 g 200 mL/hr over 30 Minutes Intravenous Every 8 hours 11/02/17 1517 11/03/17 1145   11/01/17 1600  meropenem (MERREM) 1 g in sodium chloride 0.9 % 100 mL IVPB     1 g 200 mL/hr over 30 Minutes Intravenous Every 8 hours 11/01/17 0959 11/02/17 0132   11/01/17 0600  meropenem (MERREM) 1 g in sodium chloride 0.9 % 100 mL IVPB     1 g 200 mL/hr over 30 Minutes Intravenous On call to O.R. 11/01/17 0556 11/02/17 2409      Current Facility-Administered Medications  Medication Dose Route Frequency Provider Last Rate Last Dose  . 0.9 %  sodium chloride infusion   Intravenous PRN Ardis Hughs, MD   Stopped at 11/04/17 [phone]  . acetaminophen (TYLENOL) tablet 650 mg  650 mg Oral Q6H PRN Clydene Laming, Case M, MD   650 mg at 11/07/17 0255   . atorvastatin (LIPITOR) tablet 40 mg  40 mg Oral QHS Ardis Hughs, MD   40 mg at 11/06/17 2145  . carvedilol (COREG) tablet 3.125 mg  3.125 mg Oral BID WC Ardis Hughs, MD   3.125 mg at 11/07/17 0807  . clonazePAM (KLONOPIN) tablet 0.5 mg  0.5 mg Oral BID Ardis Hughs, MD   0.5 mg at 11/06/17 2145  . diphenhydrAMINE (BENADRYL) injection 12.5 mg  12.5 mg Intravenous Q6H PRN Ardis Hughs, MD       Or  . diphenhydrAMINE (BENADRYL) 12.5 MG/5ML elixir 12.5 mg  12.5 mg Oral Q6H PRN Ardis Hughs, MD   12.5 mg at 11/06/17 1602  . divalproex (DEPAKOTE) DR tablet 250 mg  250 mg Oral TID Ardis Hughs, MD   250 mg at 11/06/17 2145  . docusate sodium (COLACE) capsule 100 mg  100 mg Oral BID Ardis Hughs, MD   100 mg at 11/06/17 2145  . enoxaparin (LOVENOX) injection 40 mg  40 mg Subcutaneous Q24H Ardis Hughs, MD   40 mg at 11/07/17 0814  . escitalopram (LEXAPRO) tablet 10 mg  10 mg Oral q morning - 10a  Ardis Hughs, MD   10 mg at 11/06/17 1962  . gabapentin (NEURONTIN) capsule 300 mg  300 mg Oral QID Ardis Hughs, MD   300 mg at 11/06/17 2145  . insulin aspart (novoLOG) injection 0-15 Units  0-15 Units Subcutaneous Q4H Ardis Hughs, MD   2 Units at 11/07/17 0539  . ipratropium-albuterol (DUONEB) 0.5-2.5 (3) MG/3ML nebulizer solution 3 mL  3 mL Nebulization Q6H PRN Ardis Hughs, MD      . levofloxacin [facility]) tablet 750 mg  750 mg Oral Daily Charlynne Cousins, MD   750 mg at 11/06/17 2297  . levothyroxine (SYNTHROID, LEVOTHROID) tablet 125 mcg  125 mcg Oral QAC breakfast Ardis Hughs, MD   125 mcg at 11/07/17 [phone]  . MEDLINE mouth rinse  15 mL Mouth Rinse BID Ardis Hughs, MD   15 mL at 11/06/17 2146  . ondansetron (ZOFRAN) injection 4 mg  4 mg Intravenous Q4H PRN Ardis Hughs, MD   4 mg at 11/02/17 0433  . primidone (MYSOLINE) tablet 50 mg  50 mg Oral QHS Ardis Hughs, MD   50 mg at 11/06/17  2146  . senna (SENOKOT) tablet 8.6 mg  1 tablet Oral BID Ardis Hughs, MD   8.6 mg at 11/06/17 2146     Objective: Vital signs in last 24 hours: Temp:  [98.2 F (36.8 C)-98.3 F (36.8 C)] 98.2 F (36.8 C) (07/07 0426) Pulse Rate:  [7-87] 7 (07/07 0807) Resp:  [14-18] 16 (07/07 0426) BP: (99-114)/(62-78) 99/64 (07/07 0807) SpO2:  [89 %-95 %] 95 % (07/07 0426)  Intake/Output from previous day: 07/06 0701 - 07/07 0700 In: 480 [P.O.:480] Out: 2050 [Urine:2050] Intake/Output this shift: No intake/output data recorded.   Physical Exam  Constitutional: She appears well-developed and well-nourished. No distress.  Cardiovascular: Normal rate, regular rhythm and normal heart sounds.  Pulmonary/Chest: Effort normal and breath sounds normal. No respiratory distress.  Abdominal: Soft. There is no tenderness.  Incisions intact  Musculoskeletal: Normal range of motion. She exhibits no edema or tenderness.  Neurological:  She remains weak  Skin: Skin is warm and dry.  Psychiatric: She has a normal mood and affect.    Lab Results:  Recent Labs    11/05/17 0540  WBC 3.9*  HGB 10.0*  HCT 31.4*  PLT 118*   BMET Recent Labs    11/05/17 0540 11/06/17 0523  NA 144 142  K 3.8 4.1  CL 97* 100  CO2 36* 31  GLUCOSE 146* 172*  BUN 29* 25*  CREATININE 0.74 0.77  CALCIUM 8.6* 9.0   PT/INR No results for input(s): LABPROT, INR in the last 72 hours. ABG No results for input(s): PHART, HCO3 in the last 72 hours.  Invalid input(s): PCO2, PO2  Studies/Results: No results found.   Assessment and Plan: She is doing well following a left HAL Nephrectomy for a non-functional left kidney.   She is awaiting SNF.       LOS: 6 days    Irine Seal 11/07/2017 [phone] ID: [name], female   DOB: 1950/10/15, 68 y.o.   MRN: [id number]

## 2017-11-08 LAB — GLUCOSE, CAPILLARY
GLUCOSE-CAPILLARY: 153 mg/dL — AB (ref 70–99)
Glucose-Capillary: 152 mg/dL — ABNORMAL HIGH (ref 70–99)
Glucose-Capillary: 136 mg/dL — ABNORMAL HIGH (ref 70–99)
Glucose-Capillary: 97 mg/dL (ref 70–99)
Glucose-Capillary: 146 mg/dL — ABNORMAL HIGH (ref 70–99)
Glucose-Capillary: 145 mg/dL — ABNORMAL HIGH (ref 70–99)

## 2017-11-08 LAB — CULTURE, BLOOD (ROUTINE X 2)
CULTURE: NO GROWTH
Culture: NO GROWTH
SPECIAL REQUESTS: ADEQUATE
Special Requests: ADEQUATE

## 2017-11-08 NOTE — Progress Notes (Signed)
Urology Progress Note   7 Days Post-Op  Subjective/Interval Events: NAEON AFVSS on RA No labs I&Os not well recorded, patient reports tolerating regular diet Ambulated halls yesterday - OOB to chair this am Endorses LUE discomfort - no radiation, chest pain, or SOB  Objective: Vital signs in last 24 hours: Temp:  [97.3 F (36.3 C)-98.8 F (37.1 C)] 98.4 F (36.9 C) (07/08 0522) Pulse Rate:  [77-93] 86 (07/08 0522) Resp:  [15-21] 21 (07/08 0522) BP: (95-122)/(62) 103/62 (07/08 0522) SpO2:  [89 %-98 %] 96 % (07/08 0522)  Intake/Output from previous day: 07/07 0701 - 07/08 0700 In: 720 [P.O.:720] Out: 400 [Urine:400] Intake/Output this shift: No intake/output data recorded.  Physical Exam:  General: Alert and oriented CV: RRR Lungs: Clear Abdomen: Soft, appropriately tender. Incisions c/d/i with staples in place Ext: Minimal b/l LE edema, No erythema, focal left posterior arm soreness without edema or radiation  Lab Results: No results for input(s): HGB, HCT in the last 72 hours. BMET Recent Labs    11/06/17 0523  NA 142  K 4.1  CL 100  CO2 31  GLUCOSE 172*  BUN 25*  CREATININE 0.77  CALCIUM 9.0     Studies/Results: No results found.  Assessment/Plan:  67 y.o. female s/p left hand-assist nephrectomy with post-op course c/b fluid overload, now resolved.  - Pain well controlled without use of narcotics - Doing well on RA, continue IS and OOB - Continue PO levofloxacin through 7/10 for PNA per medicine - Continue PT/OT, anticipate d/c to SNF - social work following - Continue Lovenox PPX - Continue home medications - LUE soreness likely muscular, will continue to monitor   Dispo: Med-surg   LOS: 7 days   Case Rob Bunting 11/08/2017, 8:37 AM

## 2017-11-08 NOTE — Care Management Important Message (Signed)
Important Message  Patient Details  Name: Stachia Slutsky MRN: [id number] Date of Birth: 03/03/1951   Medicare Important Message Given:  Yes    Kerin Salen 11/08/2017, 1:49 [place] Message  Patient Details  Name: Fleda Pagel MRN: [id number] Date of Birth: July 12, 1950   Medicare Important Message Given:  Yes    Kerin Salen 11/08/2017, 1:49 PM

## 2017-11-08 NOTE — Progress Notes (Signed)
Patient and family chose bed at [facility] in [place]. Facility has started [employer].  LCSW will continue to follow for dc needs.

## 2017-11-08 NOTE — Discharge Summary (Addendum)
[facility] Discharge Summary  Admit date: 11/01/2017  Discharge date and time: 11/08/17   Discharge to: Home  Discharge Service: Urology  Discharge Attending Physician: Link Snuffer, MD  Discharge  Diagnoses: Chronic pyelonephritis  Secondary Diagnosis: Active Problems:   Healthcare-associated pneumonia   Bipolar 1 disorder ([place])   Acute on chronic respiratory failure with hypoxia ([place])   Pyelonephritis   COPD (chronic obstructive pulmonary disease) ([place])   History of nephrectomy   Normocytic anemia   OR Procedures: Procedure(s): LEFT HAND ASSISTED LAPAROSCOPIC NEPHRECTOMY 11/01/2017   Ancillary Procedures: None   Discharge Day Services: The patient was seen and examined by the Urology team both in the morning and immediately prior to discharge.  Vital signs and laboratory values were stable and within normal limits.  The physical exam was benign and unchanged and all surgical wounds were examined.  Discharge instructions were explained and all questions answered.  Subjective  No acute events overnight. Pain Controlled. No fever or chills.  Objective Patient Vitals for the past 8 hrs:  BP Temp Temp src Pulse Resp SpO2  11/08/17 0522 103/62 98.4 F (36.9 C) Oral 86 (!) 21 96 %   No intake/output data recorded.  General Appearance:        No acute distress Lungs:                       Normal work of breathing on room air Heart:                                Regular rate and rhythm Abdomen:                         Soft, non-tender, non-distended, incisions c/d/i with staples in place Extremities:                      Warm and well perfused   Hospital Course:  The patient underwent HAL nephrectomy on 11/01/2017 for non-functioning and chronically infected left kidney. The patient tolerated the procedure well, was extubated in the OR, and afterwards was taken to the PACU for routine post-surgical care. When stable the patient was transferred to stepdown.  The patient had  increasing oxygen requirements on POD#1 and physical exam/CXR was consistent with fluid overload with possible superimposed pneumonia.  Medicine was consulted and her condition improved with Lasix diuresis and antibiotics.  Speech therapy was also consulted given nursing concern for aspiration.  She was eventually advanced to a regular diet at the recommendation of speech therapy, which she tolerated well.  PT/OT was also involved in the patient's post-operative recovery, with the recommendation that she be discharged to a SNF.  SNF placement was coordinated by social work. The patient was discharged home 7 Days Post-Op, at which point was tolerating a regular solid diet, was able to void spontaneously, have adequate pain control with P.O. pain medication, and could ambulate without difficulty. She will complete a course of PO Levaquin, prescribed through 7/10. The patient will follow up with [place] for post op check and staple removal.  Condition at Discharge: Improved  Discharge Medications:  Allergies as of 11/08/2017      Reactions   Codeine Nausea And Vomiting   In cough syrup cause nausea and vomiting   Penicillins Hives, Rash, Other (See Comments)   Not effective per patient. Pt does not know. She  states it was when she was a child. Unknown      Medication List    TAKE these medications   albuterol 108 (90 Base) MCG/ACT inhaler Commonly known as:  PROVENTIL HFA;VENTOLIN HFA Inhale 2 puffs into the lungs every 6 (six) hours as needed for wheezing or shortness of breath.   aspirin 81 MG chewable tablet Chew 81 mg by mouth every morning.   atorvastatin 40 MG tablet Commonly known as:  LIPITOR Take 40 mg by mouth at bedtime.   bisacodyl 10 MG suppository Commonly known as:  DULCOLAX Place 1 suppository (10 mg total) rectally daily as needed for moderate constipation.   carvedilol 3.125 MG tablet Commonly known as:  COREG Take 3.125 mg by mouth 2 (two) times daily with a meal.    clonazePAM 0.5 MG tablet Commonly known as:  KLONOPIN Take 1 tablet (0.5 mg total) by mouth 2 (two) times daily.   divalproex 250 MG DR tablet Commonly known as:  DEPAKOTE Take 250 mg by mouth 3 (three) times daily. Do not crush   docusate sodium 100 MG capsule Commonly known as:  COLACE Take 100 mg by mouth daily as needed for mild constipation.   escitalopram 10 MG tablet Commonly known as:  LEXAPRO Take 10 mg by mouth every morning.   gabapentin 300 MG capsule Commonly known as:  NEURONTIN Take 300 mg by mouth 4 (four) times daily.   HYDROcodone-acetaminophen 5-325 MG tablet Commonly known as:  NORCO/VICODIN Take 1 tablet by mouth every 4 (four) hours as needed for moderate pain.   hydrOXYzine 25 MG tablet Commonly known as:  ATARAX/VISTARIL Take 25 mg by mouth every 8 (eight) hours as needed for itching.   ipratropium-albuterol 0.5-2.5 (3) MG/3ML Soln Commonly known as:  DUONEB Take 3 mLs by nebulization every 6 (six) hours as needed (Wheezing/Shortness of Breath).   levofloxacin 750 MG tablet Commonly known as:  LEVAQUIN Take 1 tablet (750 mg total) by mouth daily.   levothyroxine 125 MCG tablet Commonly known as:  SYNTHROID, LEVOTHROID Take 125 mcg by mouth daily before breakfast.   loratadine 10 MG tablet Commonly known as:  CLARITIN Take 10 mg by mouth at bedtime.   multivitamin tablet Take 1 tablet by mouth every morning.   polyethylene glycol packet Commonly known as:  MIRALAX / GLYCOLAX Take 17 g by mouth daily as needed. What changed:  reasons to take this   primidone 50 MG tablet Commonly known as:  MYSOLINE Take 50 mg by mouth at bedtime.   senna-docusate 8.6-50 MG tablet Commonly known as:  Senokot-S Take 1 tablet by mouth at bedtime.   traMADol 50 MG tablet Commonly known as:  ULTRAM Take 1 tablet (50 mg total) by mouth every 6 (six) hours as needed.   zinc oxide 20 % ointment Apply 1 application topically 3 (three) times daily as  needed for irritation.

## 2017-11-09 LAB — GLUCOSE, CAPILLARY
GLUCOSE-CAPILLARY: 131 mg/dL — AB (ref 70–99)
GLUCOSE-CAPILLARY: 121 mg/dL — AB (ref 70–99)
Glucose-Capillary: 121 mg/dL — ABNORMAL HIGH (ref 70–99)

## 2017-11-09 NOTE — Progress Notes (Signed)
Occupational Therapy Treatment Patient Details Name: Laurie Moreno MRN: [id number] DOB: 08-01-1950 Today's Date: 11/09/2017    History of present illness Pt is s/p nephrectomy/adrenalectomy and L stent removal due to pylonephritis. H/o bipolar and essential tremor    OT comments  Assisted pt to the chair for breakfast.  When I returned, she used 3:1 commode, completed peri care and changed depends garment. Pt did not want to stand at the sink today  Follow Up Recommendations  SNF    Equipment Recommendations  3 in 1 bedside commode    Recommendations for Other Services      Precautions / Restrictions Precautions Precautions: Fall Precaution Comments: monitor O2 sats. incontinence Restrictions Weight Bearing Restrictions: No       Mobility Bed Mobility         Supine to sit: Supervision        Transfers   Equipment used: Rolling walker (2 wheeled)   Sit to Stand: Min guard Stand pivot transfers: Min guard       General transfer comment: cues for UE placement and min guard for safety    Balance                                           ADL either performed or assessed with clinical judgement   ADL                           Toilet Transfer: Min guard;Stand-pivot;RW   Toileting- Clothing Manipulation and Hygiene: Moderate assistance;Sit to/from stand;Maximal assistance         General ADL Comments: pt got up to chair to eat. Returned after breakfast and she used 3:1 commode.  Mod A for hygiene and max A to don new depends brief. Pt did not wan to stand at sink this session     Vision       Perception     Praxis      Cognition Arousal/Alertness: Awake/alert Behavior During Therapy: WFL for tasks assessed/performed Overall Cognitive Status: Within Functional Limits for tasks assessed                                 General Comments: pt using call bell for assistance with transfer        Exercises      Shoulder Instructions       General Comments      Pertinent Vitals/ Pain       Pain Assessment: No/denies pain  Home Living Family/patient expects to be discharged to:: Skilled nursing facility                                        Prior Functioning/Environment              Frequency  Min 2X/week        Progress Toward Goals  OT Goals(current goals can now be found in the care plan section)  Progress towards OT goals: Progressing toward goals     Plan      Co-evaluation                 AM-PAC PT "6 Clicks" Daily Activity     Outcome  Measure   Help from another person eating meals?: A Little Help from another person taking care of personal grooming?: A Little Help from another person toileting, which includes using toliet, bedpan, or urinal?: A Lot Help from another person bathing (including washing, rinsing, drying)?: A Little Help from another person to put on and taking off regular upper body clothing?: A Little Help from another person to put on and taking off regular lower body clothing?: Total 6 Click Score: 15    End of Session    OT Visit Diagnosis: Muscle weakness (generalized) (M62.81)   Activity Tolerance Patient tolerated treatment well   Patient Left in chair;with call bell/phone within reach;with chair alarm set   Nurse Communication          Time: [phone] OT Time Calculation (min): 20 min  Charges: OT General Charges $OT Visit: 1 Visit OT Treatments $Self Care/Home Management : 8-22 mins  Lesle Chris, OTR/L [phone] 11/09/2017   [place] 11/09/2017, 11:14 AM

## 2017-11-09 NOTE — Clinical Social Work Placement (Addendum)
   11:16 AM Patient and family chose bed at [facility] in [place].   LCSW confirmed bed with facility.  LCSW notified family of transport. Family will transport in personal car.   LCSW faxed dc docs to facility.   RN report #: [id number]  BKJ  CLINICAL SOCIAL WORK PLACEMENT  NOTE  Date:  11/09/2017  Patient Details  Name: Laurie Moreno MRN: [id number] Date of Birth: 1950-09-13  Clinical Social Work is seeking post-discharge placement for this patient at the [place] level of care (*CSW will initial, date and re-position this form in  chart as items are completed):  Yes   Patient/family provided with [facility] Work Department's list of facilities offering this level of care within the geographic area requested by the patient (or if unable, by the patient's family).  Yes   Patient/family informed of their freedom to choose among providers that offer the needed level of care, that participate in Medicare, Medicaid or managed care program needed by the patient, have an available bed and are willing to accept the patient.  Yes   Patient/family informed of 's ownership interest in [facility] and [facility], as well as of the fact that they are under no obligation to receive care at these facilities.  PASRR submitted to EDS on       PASRR number received on       Existing PASRR number confirmed on       FL2 transmitted to all facilities in geographic area requested by pt/family on 11/08/17     FL2 transmitted to all facilities within larger geographic area on       Patient informed that his/her managed care company has contracts with or will negotiate with certain facilities, including the following:        Yes   Patient/family informed of bed offers received.  Patient chooses bed at [facility]     Physician recommends and patient chooses bed at      Patient to be transferred to  [facility] on 11/09/17.  Patient to be transferred to facility by Personal Car     Patient family notified on 11/09/17 of transfer.  Name of family member notified:  [facility]       Additional Comment:    _______________________________________________ Servando Snare, LCSW 11/09/2017, 11:16 AM

## 2017-11-09 NOTE — Progress Notes (Signed)
RN called [phone] to give a report at 1120 and tried 3 attempts but no one answered the phone and voice message did not set up yet; RN could not leave any message for report before patient is transferring to SNF today.

## 2018-03-24 ENCOUNTER — Emergency Department (HOSPITAL_COMMUNITY): Payer: Medicare Other

## 2018-03-24 ENCOUNTER — Encounter (HOSPITAL_COMMUNITY): Payer: Self-pay | Admitting: Emergency Medicine

## 2018-03-24 ENCOUNTER — Other Ambulatory Visit: Payer: Self-pay

## 2018-03-24 ENCOUNTER — Emergency Department (HOSPITAL_COMMUNITY)
Admission: EM | Admit: 2018-03-24 | Discharge: 2018-03-24 | Disposition: A | Discharge status: Home / Self Care | Payer: Medicare Other | Attending: Emergency Medicine | Admitting: Emergency Medicine

## 2018-03-24 DIAGNOSIS — F319 Bipolar disorder, unspecified: Secondary | ICD-10-CM | POA: Diagnosis not present

## 2018-03-24 DIAGNOSIS — J449 Chronic obstructive pulmonary disease, unspecified: Secondary | ICD-10-CM | POA: Insufficient documentation

## 2018-03-24 DIAGNOSIS — E039 Hypothyroidism, unspecified: Secondary | ICD-10-CM | POA: Diagnosis not present

## 2018-03-24 DIAGNOSIS — F17210 Nicotine dependence, cigarettes, uncomplicated: Secondary | ICD-10-CM | POA: Diagnosis not present

## 2018-03-24 DIAGNOSIS — I509 Heart failure, unspecified: Secondary | ICD-10-CM | POA: Diagnosis not present

## 2018-03-24 DIAGNOSIS — Z79899 Other long term (current) drug therapy: Secondary | ICD-10-CM | POA: Insufficient documentation

## 2018-03-24 DIAGNOSIS — R4182 Altered mental status, unspecified: Secondary | ICD-10-CM

## 2018-03-24 DIAGNOSIS — I110 Hypertensive heart disease with heart failure: Secondary | ICD-10-CM | POA: Insufficient documentation

## 2018-03-24 DIAGNOSIS — Z7982 Long term (current) use of aspirin: Secondary | ICD-10-CM | POA: Insufficient documentation

## 2018-03-24 LAB — COMPREHENSIVE METABOLIC PANEL
ALBUMIN: 3.8 g/dL (ref 3.5–5.0)
ALK PHOS: 46 U/L (ref 38–126)
ALT: 11 U/L (ref 0–44)
ANION GAP: 7 (ref 5–15)
AST: 11 U/L — AB (ref 15–41)
BUN: 13 mg/dL (ref 8–23)
CHLORIDE: 109 mmol/L (ref 98–111)
CO2: 27 mmol/L (ref 22–32)
Calcium: 8.5 mg/dL — ABNORMAL LOW (ref 8.9–10.3)
Creatinine, Ser: 0.76 mg/dL (ref 0.44–1.00)
GFR calc Af Amer: >60 mL/min (ref >60)
Glucose, Bld: 125 mg/dL — ABNORMAL HIGH (ref 70–99)
POTASSIUM: 4.3 mmol/L (ref 3.5–5.1)
SODIUM: 143 mmol/L (ref 135–145)
TOTAL PROTEIN: 6.4 g/dL — AB (ref 6.5–8.1)
Total Bilirubin: 0.9 mg/dL (ref 0.3–1.2)

## 2018-03-24 LAB — CBC WITH DIFFERENTIAL/PLATELET
ABS IMMATURE GRANULOCYTES: 0.01 10*3/uL (ref 0.00–0.07)
BASOS ABS: 0 10*3/uL (ref 0.0–0.1)
BASOS PCT: 1 %
Eosinophils Absolute: 0 10*3/uL (ref 0.0–0.5)
Eosinophils Relative: 1 %
HCT: 41.7 % (ref 36.0–46.0)
Hemoglobin: 13.3 g/dL (ref 12.0–15.0)
IMMATURE GRANULOCYTES: 0 %
Lymphocytes Relative: 58 %
Lymphs Abs: 1.6 10*3/uL (ref 0.7–4.0)
MCH: 30.4 pg (ref 26.0–34.0)
MCHC: 31.9 g/dL (ref 30.0–36.0)
MCV: 95.4 fL (ref 80.0–100.0)
MONO ABS: 0.3 10*3/uL (ref 0.1–1.0)
Monocytes Relative: 11 %
NEUTROS ABS: 0.8 10*3/uL — AB (ref 1.7–7.7)
NEUTROS PCT: 29 %
Platelets: 91 10*3/uL — ABNORMAL LOW (ref 150–400)
RBC: 4.37 MIL/uL (ref 3.87–5.11)
RDW: 13.3 % (ref 11.5–15.5)
WBC: 2.8 10*3/uL — AB (ref 4.0–10.5)
nRBC: 0 % (ref 0.0–0.2)

## 2018-03-24 LAB — URINALYSIS, ROUTINE W REFLEX MICROSCOPIC
Bacteria, UA: NONE SEEN
Bilirubin Urine: NEGATIVE
Glucose, UA: NEGATIVE mg/dL
KETONES UR: NEGATIVE mg/dL
Leukocytes, UA: NEGATIVE
NITRITE: NEGATIVE
PH: 6 (ref 5.0–8.0)
PROTEIN: NEGATIVE mg/dL
Specific Gravity, Urine: 1.013 (ref 1.005–1.030)

## 2018-03-24 LAB — BLOOD GAS, ARTERIAL
Acid-Base Excess: 3 mmol/L — ABNORMAL HIGH (ref 0.0–2.0)
Bicarbonate: 25.8 mmol/L (ref 20.0–28.0)
DRAWN BY: 419771
FIO2: 21
O2 SAT: 86.7 %
PATIENT TEMPERATURE: 37
PCO2 ART: 58.5 mmHg — AB (ref 32.0–48.0)
pH, Arterial: 7.312 — ABNORMAL LOW (ref 7.350–7.450)
pO2, Arterial: 53.6 mmHg — ABNORMAL LOW (ref 83.0–108.0)

## 2018-03-24 LAB — CBG MONITORING, ED: GLUCOSE-CAPILLARY: 99 mg/dL (ref 70–99)

## 2018-03-24 LAB — AMMONIA: AMMONIA: 40 umol/L — AB (ref 9–35)

## 2018-03-24 LAB — VALPROIC ACID LEVEL: VALPROIC ACID LVL: 14 ug/mL — AB (ref 50.0–100.0)

## 2018-03-24 MED ORDER — ALBUTEROL SULFATE (2.5 MG/3ML) 0.083% IN NEBU
5.0000 mg | INHALATION_SOLUTION | Freq: Once | RESPIRATORY_TRACT | Status: AC
  Administered 2018-03-24: 5 mg via RESPIRATORY_TRACT
  Filled 2018-03-24: qty 6

## 2018-03-24 MED ORDER — IPRATROPIUM-ALBUTEROL 0.5-2.5 (3) MG/3ML IN SOLN
3.0000 mL | Freq: Once | RESPIRATORY_TRACT | Status: AC
  Administered 2018-03-24: 3 mL via RESPIRATORY_TRACT
  Filled 2018-03-24: qty 3

## 2018-03-24 NOTE — ED Triage Notes (Signed)
Per nursing facility pt is more confused than normal. Per ems pt had trouble walking at facility.

## 2018-03-24 NOTE — ED Provider Notes (Signed)
Blood pressure 120/70, pulse 66, temperature 97.8 F (36.6 C), temperature source Oral, resp. rate 18, weight 95 kg, SpO2 95 %.  Assuming care from Dr. Christy Gentles.  In short, Laurie Moreno is a 67 y.o. female with a chief complaint of Altered Mental Status .  Refer to the original H&P for additional details.  The current plan of care is to f/u CBC and reassess.  6:55 AM CBC resulted.  Patient with thrombocytopenia which is near her baseline values.  Mild leukopenia as well.  No fever or infection symptoms.  No anemia.  Patient feeling better after nebulizer treatment.  Family at bedside.  Plan for discharge back to skilled nursing facility.     Margette Fast, MD 03/24/18 [phone]

## 2018-03-24 NOTE — ED Notes (Signed)
ED Provider at bedside. 

## 2018-03-24 NOTE — ED Provider Notes (Signed)
[facility] EMERGENCY DEPARTMENT Provider Note   CSN: [id number] Arrival date & time: 03/24/18  9924     History   Chief Complaint Chief Complaint  Patient presents with  . Altered Mental Status   Level 5 caveat due to altered mental status HPI Laurie Moreno is a 67 y.o. female.  The history is provided by the patient, the nursing home and the EMS personnel. The history is limited by the condition of the patient.  Altered Mental Status   This is a new problem. Episode onset: Unknown. The problem has been gradually improving. Associated symptoms include confusion and somnolence.   Patient with history of bipolar, COPD presents with confusion and altered mental status. Patient was woken up tonight and was noted to be more confused, and had difficulty walking.  She typically can walk with a walker at baseline, but states she could barely walk with assistance.  EMS was called and she seemed to be improving.  No recent illnesses.  No change in medications.  Patient currently denies any complaints  Most of history was provided by staff at nursing facility who I spoke to via phone Past Medical History:  Diagnosis Date  . Acute pyelonephritis   . Anxiety   . Bipolar disorder, unspecified ([place])    From [facility] form  . CHF (congestive heart failure) ([place])    see LOV 06-25-16 epic care everywhere ; "sister reports that patient has a 'weak heart'  but they did testing and she hasnt had to go back since"  . Chronic respiratory failure with hypoxia ([place])   . Complication of anesthesia    reports with last surgery on 09-07-17 it took 3 hours for her to wake up and she was on the ventilator post operatively for an additional  8 hours   . COPD (chronic obstructive pulmonary disease) ([place])   . Dermatitis   . Dermatitis   . Dysrhythmia   . Elevated temperature 10/26/2017   temp of 99.9 , denies cold/flu sx; reports she gets frwquent UTIs, patient sister accompanying today  reports she is  going to Dr Gloriann Loan (surgeon) office today  for pre-op appt  at 2pm  . Essential tremor   . Family history of adverse reaction to anesthesia    sister gets nausea and vomiting   . Fever   . History of ESBL E. coli infection    10-26-17 this RN did not enter this ESBL inf hx; patient and sister deny hx of ESBL infection and there is no supporting lab work for ESBL infection in epic   . Hyperlipidemia   . Hypothyroidism   . Thrombocytopenia ([place])   . Unspecified hydronephrosis   . Urinary incontinence     Patient Active Problem List   Diagnosis Date Noted  . Normocytic anemia 11/03/2017  . 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 Renal abscess 09/08/2017  . Respiratory failure with hypercapnia ([place])   . Urinary tract infection due to extended-spectrum beta lactamase (ESBL) producing Escherichia coli 08/25/2017  . Pyelonephritis 08/21/2017  . COPD (chronic obstructive pulmonary disease) ([place]) 08/21/2017  . Thrombocytopenia ([place]) 08/21/2017  . Ureteropelvic junction (UPJ) obstruction, left   . Healthcare-associated pneumonia 08/07/2017  . Bipolar 1 disorder ([place]) 08/07/2017  . Essential tremor 08/07/2017  . Acute on chronic respiratory failure with hypoxia ([place]) 08/07/2017  . Altered mental status 08/06/2017    Past Surgical History:  Procedure Laterality Date  . CARDIAC CATHETERIZATION  07/15/2016   at [place] W/  URETERAL STENT PLACEMENT Left 09/07/2017   Procedure: CYSTOSCOPY WITH RETROGRADE PYELOGRAM/URETERAL STENT PLACEMENT;  Surgeon: Lucas Mallow, MD;  Location: [facility]S;  Service: Urology;  Laterality: Left;  . EYE SURGERY  5 years ago    bilateral cataract extraction   . LAPAROSCOPIC NEPHRECTOMY, HAND ASSISTED Left 11/01/2017   Procedure: LEFT HAND ASSISTED LAPAROSCOPIC NEPHRECTOMY;  Surgeon: Lucas Mallow, MD;  Location: [facility]S;  Service: Urology;  Laterality: Left;     OB History   None      Home Medications    Prior to Admission medications     Medication Sig Start Date End Date Taking? Authorizing Provider  albuterol (PROVENTIL HFA;VENTOLIN HFA) 108 (90 Base) MCG/ACT inhaler Inhale 2 puffs into the lungs every 6 (six) hours as needed for wheezing or shortness of breath.     [provider]  aspirin 81 MG chewable tablet Chew 81 mg by mouth every morning.     [provider]  atorvastatin (LIPITOR) 40 MG tablet Take 40 mg by mouth at bedtime.     [provider]  bisacodyl (DULCOLAX) 10 MG suppository Place 1 suppository (10 mg total) rectally daily as needed for moderate constipation. 09/13/17   Sheikh, Omair Latif, DO  carvedilol (COREG) 3.125 MG tablet Take 3.125 mg by mouth 2 (two) times daily with a meal.    [provider]  clonazePAM (KLONOPIN) 0.5 MG tablet Take 1 tablet (0.5 mg total) by mouth 2 (two) times daily. 09/17/17   Granville Lewis C, PA-C  divalproex (DEPAKOTE) 250 MG DR tablet Take 250 mg by mouth 3 (three) times daily. Do not crush    [provider]  docusate sodium (COLACE) 100 MG capsule Take 100 mg by mouth daily as needed for mild constipation.     [provider]  escitalopram (LEXAPRO) 10 MG tablet Take 10 mg by mouth every morning.     [provider]  gabapentin (NEURONTIN) 300 MG capsule Take 300 mg by mouth 4 (four) times daily.    [provider]  HYDROcodone-acetaminophen (NORCO/VICODIN) 5-325 MG tablet Take 1 tablet by mouth every 4 (four) hours as needed for moderate pain.    [provider]  hydrOXYzine (ATARAX/VISTARIL) 25 MG tablet Take 25 mg by mouth every 8 (eight) hours as needed for itching.    [provider]  ipratropium-albuterol (DUONEB) 0.5-2.5 (3) MG/3ML SOLN Take 3 mLs by nebulization every 6 (six) hours as needed (Wheezing/Shortness of Breath).     [provider]  levothyroxine (SYNTHROID, LEVOTHROID) 125 MCG tablet Take 125 mcg by mouth daily before breakfast.    [provider]   loratadine (CLARITIN) 10 MG tablet Take 10 mg by mouth at bedtime.     [provider]  Multiple Vitamin (MULTIVITAMIN) tablet Take 1 tablet by mouth every morning.     [provider]  primidone (MYSOLINE) 50 MG tablet Take 50 mg by mouth at bedtime.     [provider]  senna-docusate (SENOKOT-S) 8.6-50 MG tablet Take 1 tablet by mouth at bedtime. 09/13/17   Sheikh, Georgina Quint Latif, DO  traMADol (ULTRAM) 50 MG tablet Take 1 tablet (50 mg total) by mouth every 6 (six) hours as needed. 11/05/17 11/05/18  Marton Redwood III, MD  zinc oxide 20 % ointment Apply 1 application topically 3 (three) times daily as needed for irritation.    [provider]    Family History No family history on file.  Social  History Social History   Tobacco Use  . Smoking status: Current Some Day Smoker    Packs/day: 1.00    Years: 45.00    Pack years: 45.00    Types: Cigarettes  . Smokeless tobacco: Never Used  . Tobacco comment: cut down no to about 10 cigarettes per week  Substance Use Topics  . Alcohol use: Not Currently  . Drug use: Not Currently     Allergies   Codeine and Penicillins   Review of Systems Review of Systems  Unable to perform ROS: Mental status change  Psychiatric/Behavioral: Positive for confusion.     Physical Exam Updated Vital Signs BP 126/84   Pulse 66   Temp 97.8 F (36.6 C) (Oral)   Resp 17   Wt 95 kg   SpO2 94%   BMI 37.10 kg/m   Physical Exam  CONSTITUTIONAL: Elderly, no acute distress HEAD: Normocephalic/atraumatic EYES: EOMI/PERRL, keeps eyes closed ENMT: Mucous membranes dry, edentulous NECK: supple no meningeal signs SPINE/BACK:entire spine nontender CV: S1/S2 noted, no murmurs/rubs/gallops noted LUNGS: Bibasilar crackles, no acute distress ABDOMEN: soft, nontender, no rebound or guarding, bowel sounds noted throughout abdomen GU:no cva tenderness NEURO: Pt is somnolent but easily arousable and talkative.  She does  appear drowsy.  No obvious facial droop.  No arm or leg drift is noted EXTREMITIES: pulses normal/equal, full ROM SKIN: warm, color normal PSYCH: unable to assess  ED Treatments / Results  Labs (all labs ordered are listed, but only abnormal results are displayed) Labs Reviewed  COMPREHENSIVE METABOLIC PANEL - Abnormal; Notable for the following components:      Result Value   Glucose, Bld 125 (*)    Calcium 8.5 (*)    Total Protein 6.4 (*)    AST 11 (*)    All other components within normal limits  VALPROIC ACID LEVEL - Abnormal; Notable for the following components:   Valproic Acid Lvl 14 (*)    All other components within normal limits  AMMONIA - Abnormal; Notable for the following components:   Ammonia 40 (*)    All other components within normal limits  URINALYSIS, ROUTINE W REFLEX MICROSCOPIC - Abnormal; Notable for the following components:   Hgb urine dipstick SMALL (*)    All other components within normal limits  BLOOD GAS, ARTERIAL - Abnormal; Notable for the following components:   pH, Arterial 7.312 (*)    pCO2 arterial 58.5 (*)    pO2, Arterial 53.6 (*)    Acid-Base Excess 3.0 (*)    All other components within normal limits  CBC WITH DIFFERENTIAL/PLATELET  CBG MONITORING, ED    EKG EKG Interpretation  Date/Time:  Thursday March 24 2018 04:46:28 EST Ventricular Rate:  71 PR Interval:    QRS Duration: 100 QT Interval:  487 QTC Calculation: 530 R Axis:   -51 Text Interpretation:  Sinus rhythm Left anterior fascicular block Probable anteroseptal infarct, old Borderline T abnormalities, inferior leads Prolonged QT interval Confirmed by Ripley Fraise [phone]) on 03/24/2018 4:52:20 AM   Radiology Dg Chest Port 1 View  Result Date: 03/24/2018 CLINICAL DATA:  More confused than normal. Difficulty walking. Shortness of breath. EXAM: PORTABLE CHEST 1 VIEW COMPARISON:  11/03/2017 FINDINGS: Heart size and pulmonary vascularity are probably normal for  technique. No airspace disease or consolidation in the lungs. No blunting of costophrenic angles. No pneumothorax. Mediastinal contours appear intact. IMPRESSION: No active disease. Electronically Signed   By: Lucienne Capers M.D.   On: 03/24/2018 05:17  Procedures Procedures    Medications Ordered in ED Medications  ipratropium-albuterol (DUONEB) 0.5-2.5 (3) MG/3ML nebulizer solution 3 mL (3 mLs Nebulization Given 03/24/18 0527)     Initial Impression / Assessment and Plan / ED Course  I have reviewed the triage vital signs and the nursing notes.  Pertinent labs & imaging results that were available during my care of the patient were reviewed by me and considered in my medical decision making (see chart for details).     4:59 AM PT Presents from nursing facility for altered mental status.  She is appears somnolent at this time but is easily arousable.  She has previous history of UTIs as well as hypercapnic respiratory failure labs and imaging are pending at this time. 5:30 AM Mental status remains unchanged.  She keeps her eyes closed, but she is able to converse and recall all details.  She reports they usually wake her up at 230 or 3 every night.  She denies any pain at this time.  She does not wear home oxygen. She does have mild hypercapnia, though no significant change from prior. She does have coarse breath sounds bilaterally, nebulized therapies been ordered Chest x-ray is negative.  Labs are pending 5:56 AM Patient is more alert, labs are pending at this time 6:35 AM Pt improved She is at baseline Suspect some of her symptoms were due to nightly medications and being woken up at 2:30 Sister at bedside confirms she is at baseline Plan to ambulate patient 6:38 AM At signout to dr long, plan for repeat nebulizer and await labs.  Final Clinical Impressions(s) / ED Diagnoses   Final diagnoses:  Altered mental status, unspecified altered mental status type    ED  Discharge Orders    None       Ripley Fraise, MD 03/24/18 [phone]

## 2018-03-24 NOTE — Discharge Instructions (Addendum)
You were seen in the ED today with confusion and sleepiness. Your labs to not show a clear reason for your symptoms. Call your PCP today to schedule a follow up appointment in the coming 3-5 days. Return to the ED with any new or worsening symptoms.

## 2018-10-13 ENCOUNTER — Other Ambulatory Visit (HOSPITAL_COMMUNITY): Payer: Self-pay | Admitting: Family Medicine

## 2018-10-13 DIAGNOSIS — Z1231 Encounter for screening mammogram for malignant neoplasm of breast: Secondary | ICD-10-CM

## 2018-10-20 ENCOUNTER — Ambulatory Visit (HOSPITAL_COMMUNITY)
Admission: RE | Admit: 2018-10-20 | Discharge: 2018-10-20 | Disposition: A | Discharge status: Home / Self Care | Payer: Medicare Other | Source: Ambulatory Visit | Attending: Family Medicine | Admitting: Family Medicine

## 2018-10-20 ENCOUNTER — Other Ambulatory Visit: Payer: Self-pay

## 2018-10-20 DIAGNOSIS — Z1231 Encounter for screening mammogram for malignant neoplasm of breast: Secondary | ICD-10-CM | POA: Diagnosis not present

## 2018-10-25 ENCOUNTER — Other Ambulatory Visit (HOSPITAL_COMMUNITY): Payer: Self-pay | Admitting: Family Medicine

## 2018-10-25 DIAGNOSIS — IMO0002 Reserved for concepts with insufficient information to code with codable children: Secondary | ICD-10-CM

## 2018-11-08 ENCOUNTER — Ambulatory Visit (HOSPITAL_COMMUNITY)
Admission: RE | Admit: 2018-11-08 | Discharge: 2018-11-08 | Disposition: A | Discharge status: Home / Self Care | Payer: Medicare Other | Source: Ambulatory Visit | Attending: Family Medicine | Admitting: Family Medicine

## 2018-11-08 ENCOUNTER — Other Ambulatory Visit (HOSPITAL_COMMUNITY): Payer: Self-pay | Admitting: Family Medicine

## 2018-11-08 ENCOUNTER — Ambulatory Visit (HOSPITAL_COMMUNITY): Payer: Medicare Other

## 2018-11-08 ENCOUNTER — Other Ambulatory Visit: Payer: Self-pay

## 2018-11-08 DIAGNOSIS — IMO0002 Reserved for concepts with insufficient information to code with codable children: Secondary | ICD-10-CM

## 2018-11-08 DIAGNOSIS — R928 Other abnormal and inconclusive findings on diagnostic imaging of breast: Secondary | ICD-10-CM | POA: Diagnosis present

## 2018-11-08 DIAGNOSIS — N6322 Unspecified lump in the left breast, upper inner quadrant: Secondary | ICD-10-CM | POA: Diagnosis not present

## 2018-11-09 ENCOUNTER — Other Ambulatory Visit (HOSPITAL_COMMUNITY): Payer: Self-pay | Admitting: Family Medicine

## 2018-11-10 ENCOUNTER — Other Ambulatory Visit (HOSPITAL_COMMUNITY): Payer: Self-pay | Admitting: Family Medicine

## 2018-11-10 DIAGNOSIS — IMO0002 Reserved for concepts with insufficient information to code with codable children: Secondary | ICD-10-CM

## 2018-11-15 ENCOUNTER — Encounter (HOSPITAL_COMMUNITY): Payer: Self-pay

## 2018-11-15 ENCOUNTER — Other Ambulatory Visit (HOSPITAL_COMMUNITY): Payer: Self-pay | Admitting: Family Medicine

## 2018-11-15 ENCOUNTER — Other Ambulatory Visit: Payer: Self-pay

## 2018-11-15 ENCOUNTER — Ambulatory Visit (HOSPITAL_COMMUNITY)
Admission: RE | Admit: 2018-11-15 | Discharge: 2018-11-15 | Disposition: A | Discharge status: Home / Self Care | Payer: Medicare Other | Source: Ambulatory Visit | Attending: Family Medicine | Admitting: Family Medicine

## 2018-11-15 DIAGNOSIS — C50212 Malignant neoplasm of upper-inner quadrant of left female breast: Secondary | ICD-10-CM | POA: Diagnosis not present

## 2018-11-15 DIAGNOSIS — IMO0002 Reserved for concepts with insufficient information to code with codable children: Secondary | ICD-10-CM

## 2018-11-15 DIAGNOSIS — Z170 Estrogen receptor positive status [ER+]: Secondary | ICD-10-CM | POA: Insufficient documentation

## 2018-11-15 DIAGNOSIS — N6320 Unspecified lump in the left breast, unspecified quadrant: Secondary | ICD-10-CM | POA: Diagnosis present

## 2018-11-15 MED ORDER — LIDOCAINE HCL (PF) 1 % IJ SOLN
INTRAMUSCULAR | Status: AC
  Administered 2018-11-15: 4 mL
  Filled 2018-11-15: qty 5

## 2018-11-15 MED ORDER — LIDOCAINE-EPINEPHRINE (PF) 1 %-1:200000 IJ SOLN
INTRAMUSCULAR | Status: AC
  Administered 2018-11-15: 4 mL
  Filled 2018-11-15: qty 30

## 2018-11-22 ENCOUNTER — Ambulatory Visit (INDEPENDENT_AMBULATORY_CARE_PROVIDER_SITE_OTHER): Payer: Medicare Other | Admitting: General Surgery

## 2018-11-22 ENCOUNTER — Encounter: Payer: Self-pay | Admitting: General Surgery

## 2018-11-22 ENCOUNTER — Other Ambulatory Visit: Payer: Self-pay

## 2018-11-22 VITALS — BP 111/77 | HR 76 | Temp 97.5°F | Resp 16 | Ht 64.0 in | Wt 192.0 lb

## 2018-11-22 DIAGNOSIS — C50212 Malignant neoplasm of upper-inner quadrant of left female breast: Secondary | ICD-10-CM | POA: Diagnosis not present

## 2018-11-22 DIAGNOSIS — Z170 Estrogen receptor positive status [ER+]: Secondary | ICD-10-CM

## 2018-11-22 NOTE — Patient Instructions (Signed)
Lumpectomy, Care After This sheet gives you information about how to care for yourself after your procedure. Your health care provider may also give you more specific instructions. If you have problems or questions, contact your health care provider. What can I expect after the procedure? After the procedure, it is common to have:  Breast swelling.  Breast tenderness.  Stiffness in your arm or shoulder.  A change in the shape and feel of your breast.  Scar tissue that feels hard to the touch in the area where the lump was removed. Follow these instructions at home: Medicines  Take over-the-counter and prescription medicines only as told by your health care provider.  Take your antibiotic medicine as told by your health care provider. Do not stop taking the antibiotic even if you start to feel better.  If you are taking prescription pain medicine, take actions to prevent or treat constipation. Your health care provider may recommend that you: ? Drink enough fluid to keep your urine pale yellow. ? Eat foods that are high in fiber, such as fresh fruits and vegetables, whole grains, and beans. ? Limit foods that are high in fat and processed sugars, such as fried and sweet foods. ? Take an over-the-counter or prescription medicine for constipation. Bathing  Do not take baths, swim, or use a hot tub until your health care provider approves. Ask your health care provider if you may take showers. You may only be allowed to take sponge baths. Incision care      Follow instructions from your health care provider about how to take care of your incision. Make sure you: ? Wash your hands with soap and water before you change your bandage (dressing). If soap and water are not available, use hand sanitizer. ? Change your dressing as told by your health care provider. ? Leave stitches (sutures), skin glue, or adhesive strips in place. These skin closures may need to stay in place for 2 weeks or  longer. If adhesive strip edges start to loosen and curl up, you may trim the loose edges. Do not remove adhesive strips completely unless your health care provider tells you to do that.  Check your incision area every day for signs of infection. Check for: ? Redness, swelling, or pain. ? Fluid or blood. ? Warmth. ? Pus or a bad smell.  Keep your dressing clean and dry.  If you were sent home with a surgical drain in place, follow instructions from your health care provider about emptying it. Activity  Return to your normal activities as told by your health care provider. Ask your health care provider what activities are safe for you.  Be careful to avoid any activities that could cause an injury to your arm on the side of your surgery.  Do not lift anything that is heavier than 10 lb (4.5 kg), or the limit that you are told, until your health care provider says that it is safe. Avoid lifting with the arm that is on the side of your surgery.  Do not carry heavy objects on your shoulder on the side of your surgery.  After your drain is removed, you should perform exercises to keep your arm from getting stiff and swollen. Talk with your health care provider about which exercises are safe for you. General instructions  Do not drive or use heavy machinery while taking prescription pain medicine.  Wear a supportive bra as told by your health care provider.  Raise (elevate) your arm above  the level of your heart while you are sitting or lying down.  Do not wear tight jewelry on your arm, wrist, or fingers on the side of your surgery.  Keep all follow-up visits as told by your health care provider. This is important. ? You may need to be screened for extra fluid around the lymph nodes (lymphedema). Follow instructions from your health care provider about how often you should be checked.  If you had any lymph nodes removed during your procedure, be sure to tell all of your health care  providers. This is important information to share before you are involved in certain procedures, such as having blood tests or having your blood pressure taken. Contact a health care provider if:  You develop a rash.  You have a fever.  Your pain medicine is not working.  Your swelling, weakness, or numbness in your arm has not improved after a few weeks.  You have new swelling in your breast or arm.  You have redness, swelling, or pain in your incision area.  You have fluid or blood coming from your incision.  Your incision feels warm to the touch.  You have pus or a bad smell coming from your incision. Get help right away if:  You have very bad pain in your breast or arm.  You have chest pain.  You have difficulty breathing. Summary  After the procedure, it is common to have breast tenderness, swelling, and stiffness in your arm and shoulder.  Follow instructions from your health care provider about how to take care of your incision.  Do not lift anything that is heavier than 10 lb (4.5 kg), or the limit that you are told, until your health care provider says that it is safe. Avoid lifting with the arm that is on the side of your surgery.  If you had any lymph nodes removed during your procedure, be sure to tell all of your health care providers. This is important information to share before you are involved in certain procedures, such as having blood tests or having your blood pressure taken. This information is not intended to replace advice given to you by your health care provider. Make sure you discuss any questions you have with your health care provider. Document Released: 05/06/2006 Document Revised: 07/29/2018 Document Reviewed: 01/01/2016 Elsevier Patient Education  2020 [place]. Total or Modified Radical Mastectomy A total mastectomy and a modified radical mastectomy are surgeries that are done as part of treatment for breast cancer. You will have one of  those types of surgery. Both types involve removing a breast.  In a total mastectomy (simple mastectomy), all breast tissue including the nipple will be removed.  In a modified radical mastectomy, lymph nodes under the arm will be removed along with the breast and nipple. Some of the lining over the muscle tissues under the breast may also be removed. These procedures may also be used to help prevent breast cancer. A preventive (prophylactic) mastectomy may be done if you are at an increased risk of breast cancer due to harmful changes (mutations) in certain genes (BRCA genes). In that case, the procedure involves removing both of your breasts. This can reduce your risk of developing breast cancer in the future. For a transgender person, a total mastectomy may be done as part of a surgical transition from female to female. Let your health care provider know about:  Any allergies you have.  All medicines you are taking, including vitamins, herbs, eye  drops, creams, and over-the-counter medicines.  Any problems you or family members have had with anesthetic medicines.  Any blood disorders you have.  Any surgeries you have had.  Any medical conditions you have.  Whether you are pregnant or may be pregnant. What are the risks? Generally, this is a safe procedure. However, problems may occur, including:  Pain.  Infection.  Bleeding.  Allergic reactions to medicines.  Scar tissue.  Chest numbness on the side of the surgery.  Fluid buildup under the skin flaps where your breast was removed (seroma).  Sensation of throbbing or tingling.  Stress or sadness from losing your breast. If you have the lymph nodes under your arm removed, you may have arm swelling, weakness, or numbness on the same side of your body as your surgery. What happens before the procedure? Staying hydrated Follow instructions from your health care provider about hydration, which may include:  Up to 2 hours  before the procedure - you may continue to drink clear liquids, such as water, clear fruit juice, black coffee, and plain tea. Eating and drinking restrictions Follow instructions from your health care provider about eating and drinking, which may include:  8 hours before the procedure - stop eating heavy meals or foods such as meat, fried foods, or fatty foods.  6 hours before the procedure - stop eating light meals or foods, such as toast or cereal.  6 hours before the procedure - stop drinking milk or drinks that contain milk.  2 hours before the procedure - stop drinking clear liquids. Medicines  Ask your health care provider about: ? Changing or stopping your regular medicines. This is especially important if you are taking diabetes medicines or blood thinners. ? Taking medicines such as aspirin and ibuprofen. These medicines can thin your blood. Do not take these medicines unless your health care provider tells you to take them. ? Taking over-the-counter medicines, vitamins, herbs, and supplements.  Your health care team may give you antibiotic medicine to help prevent infection. General instructions  You may be checked for extra fluid around your lymph nodes (lymphedema).  Plan to have someone take you home from the hospital or clinic.  Plan to have a responsible adult care for you for at least 24 hours after you leave the hospital or clinic. This is important.  Ask your health care provider how your surgical site will be marked or identified.  You may be asked to shower with a germ-killing soap. What happens during the procedure?   To lower your risk of infection: ? Your health care team will wash or sanitize their hands. ? Your skin will be washed with soap.  An IV will be inserted into one of your veins.  You will be given a medicine to make you fall asleep (general anesthetic).  A wide incision will be made around your nipple. The skin and nipple inside the  incision will be removed along with all breast tissue.  If you are having a modified radical mastectomy: ? The lining over your chest muscles will be removed. ? The incision may be extended to reach the lymph nodes under your arm, or a second incision may be made. ? Lymph nodes will be removed.  Breast tissue and lymph nodes that are removed will be sent to the lab for testing.  You may have a drainage tube inserted into your incision to collect fluid that builds up after surgery. This tube will be connected to a suction bulb on  the outside of your body to remove the fluid.  Your incision or incisions will be closed with stitches (sutures).  A bandage (dressing) will be placed over your breast area. If lymph nodes were removed, a dressing will also be placed under your arm. The procedure may vary among health care providers and hospitals. What happens after the procedure?  Your blood pressure, heart rate, breathing rate, and blood oxygen level will be monitored until the medicines you were given have worn off.  You will be given pain medicine as needed.  You will be encouraged to get up and walk as soon as you can.  Your IV can be removed when you are able to eat and drink.  You may have a drainage tube in place for 2-3 days to prevent a collection of blood (hematoma) from developing in the breast area. You will be given instructions about caring for the drain before you go home.  A pressure bandage may be applied for 1-2 days to prevent bleeding or swelling. Ask your health care provider how to care for your pressure bandage at home. Summary  In a total mastectomy (simple mastectomy), all breast tissue including the nipple will be removed. In a modified radical mastectomy, the lymph nodes under the arm will be removed along with the breast and nipple.  Before the procedure, follow instructions from your health care provider about eating and drinking, and ask about changing or  stopping your regular medicines.  You will be given a medicine to make you fall asleep (general anesthetic) during the procedure. This information is not intended to replace advice given to you by your health care provider. Make sure you discuss any questions you have with your health care provider. Document Released: 01/13/2001 Document Revised: 06/24/2018 Document Reviewed: 01/22/2017 Elsevier Patient Education  2020 [employer].

## 2018-11-23 NOTE — Progress Notes (Signed)
Laurie Moreno; [id number]; 09/27/1950   HPI Patient is a 68-year-old white female who was referred to my care by [facility] for evaluation treatment of a left breast cancer.  This was found on routine mammography.  Patient does not feel a lump.  She denies any family history of breast cancer.  She has had no nipple discharge.  Core biopsy of a 6 mm lesion in the upper, inner quadrant of the left breast reveals invasive ductal carcinoma, ER positive, PR positive, HER-2 negative.  Patient is referred for further surgical evaluation.  Patient is somewhat a poor historian due to history of mental deficiency. Past Medical History:  Diagnosis Date  . Acute pyelonephritis   . Anxiety   . Bipolar disorder, unspecified (HCC)    From [place] form  . CHF (congestive heart failure) (HCC)    see LOV 06-25-16 epic care everywhere ; "sister reports that patient has a 'weak heart'  but they did testing and she hasnt had to go back since"  . Chronic respiratory failure with hypoxia (HCC)   . Complication of anesthesia    reports with last surgery on 09-07-17 it took 3 hours for her to wake up and she was on the ventilator post operatively for an additional  8 hours   . COPD (chronic obstructive pulmonary disease) (HCC)   . Dermatitis   . Dermatitis   . Dysrhythmia   . Elevated temperature 10/26/2017   temp of 99.9 , denies cold/flu sx; reports she gets frwquent UTIs, patient sister accompanying today  reports she is going to Dr Bell (surgeon) office today  for pre-op appt  at 2pm  . Essential tremor   . Family history of adverse reaction to anesthesia    sister gets nausea and vomiting   . Fever   . History of ESBL E. coli infection    10-26-17 this RN did not enter this ESBL inf hx; patient and sister deny hx of ESBL infection and there is no supporting lab work for ESBL infection in epic   . Hyperlipidemia   . Hypothyroidism   . Thrombocytopenia (HCC)   . Unspecified hydronephrosis    . Urinary incontinence     Past Surgical History:  Procedure Laterality Date  . CARDIAC CATHETERIZATION  07/15/2016   at [facility]  . CYSTOSCOPY W/ URETERAL STENT PLACEMENT Left 09/07/2017   Procedure: CYSTOSCOPY WITH RETROGRADE PYELOGRAM/URETERAL STENT PLACEMENT;  Surgeon: Bell, Eugene D III, MD;  Location: [facility];  Service: Urology;  Laterality: Left;  . EYE SURGERY  5 years ago    bilateral cataract extraction   . LAPAROSCOPIC NEPHRECTOMY, HAND ASSISTED Left 11/01/2017   Procedure: LEFT HAND ASSISTED LAPAROSCOPIC NEPHRECTOMY;  Surgeon: Bell, Eugene D III, MD;  Location: [facility];  Service: Urology;  Laterality: Left;    History reviewed. No pertinent family history.  Current Outpatient Medications on File Prior to Visit  Medication Sig Dispense Refill  . albuterol (PROVENTIL HFA;VENTOLIN HFA) 108 (90 Base) MCG/ACT inhaler Inhale 2 puffs into the lungs every 6 (six) hours as needed for wheezing or shortness of breath.     . aspirin 81 MG chewable tablet Chew 81 mg by mouth every morning.     . atorvastatin (LIPITOR) 40 MG tablet Take 40 mg by mouth at bedtime.     . carvedilol (COREG) 3.125 MG tablet Take 3.125 mg by mouth 2 (two) times daily with a meal.    . clonazePAM (KLONOPIN) 0.5 MG tablet Take 1   tablet (0.5 mg total) by mouth 2 (two) times daily. 60 tablet 0  . divalproex (DEPAKOTE) 250 MG DR tablet Take 250 mg by mouth 3 (three) times daily. Do not crush    . docusate sodium (COLACE) 100 MG capsule Take 100 mg by mouth daily as needed for mild constipation.     . escitalopram (LEXAPRO) 10 MG tablet Take 10 mg by mouth every morning.     . gabapentin (NEURONTIN) 300 MG capsule Take 300 mg by mouth 4 (four) times daily.    . hydrOXYzine (ATARAX/VISTARIL) 25 MG tablet Take 25 mg by mouth every 8 (eight) hours as needed for itching.    . ipratropium-albuterol (DUONEB) 0.5-2.5 (3) MG/3ML SOLN Take 3 mLs by nebulization every 6 (six) hours as needed (Wheezing/Shortness of Breath).     .  levothyroxine (SYNTHROID, LEVOTHROID) 125 MCG tablet Take 125 mcg by mouth daily before breakfast.    . loratadine (CLARITIN) 10 MG tablet Take 10 mg by mouth at bedtime.     . Multiple Vitamin (MULTIVITAMIN) tablet Take 1 tablet by mouth every morning.     . primidone (MYSOLINE) 50 MG tablet Take 50 mg by mouth at bedtime.     . senna-docusate (SENOKOT-S) 8.6-50 MG tablet Take 1 tablet by mouth at bedtime.    . zinc oxide 20 % ointment Apply 1 application topically 3 (three) times daily as needed for irritation.    . bisacodyl (DULCOLAX) 10 MG suppository Place 1 suppository (10 mg total) rectally daily as needed for moderate constipation. (Patient not taking: Reported on 11/22/2018) 12 suppository 0  . HYDROcodone-acetaminophen (NORCO/VICODIN) 5-325 MG tablet Take 1 tablet by mouth every 4 (four) hours as needed for moderate pain.     No current facility-administered medications on file prior to visit.     Allergies  Allergen Reactions  . Codeine Nausea And Vomiting    In cough syrup cause nausea and vomiting  . Penicillins Hives, Rash and Other (See Comments)    Not effective per patient. Pt does not know. She states it was when she was a child. Unknown    Social History   Substance and Sexual Activity  Alcohol Use Not Currently    Social History   Tobacco Use  Smoking Status Current Some Day Smoker  . Packs/day: 1.00  . Years: 45.00  . Pack years: 45.00  . Types: Cigarettes  Smokeless Tobacco Never Used  Tobacco Comment   cut down no to about 10 cigarettes per week    Review of Systems  Constitutional: Negative.   HENT: Negative.   Respiratory: Positive for wheezing.   Cardiovascular: Negative.   Gastrointestinal: Negative.   Genitourinary: Negative.   Musculoskeletal: Positive for joint pain.  Skin: Positive for rash.  Neurological: Positive for tremors.  Endo/Heme/Allergies: Negative.   Psychiatric/Behavioral: Negative.     Objective   Vitals:   11/22/18  1537  BP: 111/77  Pulse: 76  Resp: 16  Temp: (!) 97.5 F (36.4 C)  SpO2: (!) 89%    Physical Exam Vitals signs reviewed.  Constitutional:      Appearance: Normal appearance. She is obese. She is not ill-appearing.  HENT:     Head: Normocephalic and atraumatic.  Cardiovascular:     Rate and Rhythm: Normal rate and regular rhythm.     Heart sounds: Normal heart sounds. No murmur. No friction rub. No gallop.   Pulmonary:     Effort: Pulmonary effort is normal. No respiratory distress.       Breath sounds: Normal breath sounds. No stridor. No wheezing, rhonchi or rales.  Skin:    General: Skin is warm and dry.  Neurological:     Mental Status: She is alert and oriented to person, place, and time.   Breast: Large pendulous breasts noted bilaterally.  Well-healing biopsy site in the upper, inner quadrant of the left breast.  No dominant mass, nipple discharge, or dimpling are noted in either breast.  Axillas are negative for palpable nodes.  Mammogram of pathology reports reviewed  Assessment  Invasive ductal carcinoma of left breast, 5 mm, ER positive, PR positive, HER-2 negative Plan   I have extensive discussion with the patient and her sister concerning concerning the surgical options for her.  Due to her mental deficiency and comorbidities, I think she needs some time to think about what she wants to do.  Ideally, a left modified radical mastectomy or left partial mastectomy with sentinel lymph node biopsy and radiation therapy would be performed.  Given that the lesion is very small, more limited surgical approach may be indicated.  She is scheduled to see Dr. Katragadda of oncology on 11/28/2018.  We will discuss with him all possible options to treat this patient.  Patient will follow-up with me in 3 weeks. 

## 2018-11-28 ENCOUNTER — Other Ambulatory Visit: Payer: Self-pay

## 2018-11-28 ENCOUNTER — Encounter (HOSPITAL_COMMUNITY): Payer: Self-pay | Admitting: Surgery

## 2018-11-28 ENCOUNTER — Encounter (HOSPITAL_COMMUNITY): Payer: Self-pay | Admitting: *Deleted

## 2018-11-28 ENCOUNTER — Encounter (HOSPITAL_COMMUNITY): Payer: Self-pay | Admitting: Hematology

## 2018-11-28 ENCOUNTER — Inpatient Hospital Stay (HOSPITAL_COMMUNITY): Payer: Medicare Other | Attending: Hematology | Admitting: Hematology

## 2018-11-28 DIAGNOSIS — D72819 Decreased white blood cell count, unspecified: Secondary | ICD-10-CM | POA: Diagnosis not present

## 2018-11-28 DIAGNOSIS — Z905 Acquired absence of kidney: Secondary | ICD-10-CM | POA: Diagnosis not present

## 2018-11-28 DIAGNOSIS — Z170 Estrogen receptor positive status [ER+]: Secondary | ICD-10-CM | POA: Diagnosis not present

## 2018-11-28 DIAGNOSIS — D696 Thrombocytopenia, unspecified: Secondary | ICD-10-CM

## 2018-11-28 DIAGNOSIS — C50212 Malignant neoplasm of upper-inner quadrant of left female breast: Secondary | ICD-10-CM | POA: Insufficient documentation

## 2018-11-28 DIAGNOSIS — C50919 Malignant neoplasm of unspecified site of unspecified female breast: Secondary | ICD-10-CM | POA: Insufficient documentation

## 2018-11-28 DIAGNOSIS — F17210 Nicotine dependence, cigarettes, uncomplicated: Secondary | ICD-10-CM

## 2018-11-28 NOTE — Assessment & Plan Note (Addendum)
1.  Left breast infiltrating ductal carcinoma: - Screening mammogram on 10/20/2018 showed a possible mass in the left breast.  Ultrasound of the left breast showed irregular hypoechoic mass at 11 o'clock position measuring 0.5 x 0.4 x 0.4 cm.  No lymphadenopathy in the left axilla. - Biopsy on 11/15/2018 consistent with invasive ductal carcinoma, grade 2, ER/PR positive, HER-2 negative, Ki-67 10%. -She was seen by Dr. Arnoldo Morale on 11/22/2018 and various surgical options were discussed. -She was reportedly in a assisted living facility for the last 2 years, has been living with her sister for the last couple of months. - I have discussed the pathology report in detail with the patient and her sister.  We have also discussed various surgical options including lumpectomy with SLNB and modified radical mastectomy. -Given her comorbidities, I think a modified radical mastectomy is a very reasonable option. - She has an appointment to see Dr. Arnoldo Morale on 12/13/2018.  I will see her back 4 weeks after surgery.  We will plan to start her on aromatase inhibitor therapy.  We will also obtain DEXA scan.  2.  Cytopenias: - CT of the abdomen and pelvis in April 2019 showed nodular liver consistent with mild cirrhosis.  Spleen was normal. -She has thrombocytopenia ranging between 78-272 since 2019. -She also has mild leukopenia with decreased neutrophil count on 2-3 occasions. -She does not have any history of alcohol use.  We will make a referral to hepatology clinic.  3.  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  Glomerular sclerosis and tubular atrophy. -Nephrectomy was reportedly done due to recurrent infections and a nonfunctioning kidney.  4.  Tobacco abuse: -He smokes about half pack of cigarettes per day for the past 45 years.  5.  F amily history: - Mother and father had colon cancer.  Paternal grandmother and paternal grandfather  also had colon cancer.  Brother died of melanoma.

## 2018-11-28 NOTE — Progress Notes (Signed)
I met with patient and sister during visit with Dr. Delton Coombes. I discussed my role in patient's care and how I can be of assistance to them during this time.  I went over the plan with them following the appointment and made sure that their questions were answered.  The patient lives with her sister and her sister is making sure that everything is done correctly as the patient has recently been lost to medical follow up.  Both patient and her sister verbalize understanding of the plan and what the next steps will be in her care.  I provided my contact information for them to call should they have any questions or concerns.

## 2018-11-28 NOTE — Progress Notes (Signed)
[facility] NOTE  Patient Care Team: Zhou-Talbert, [name], MD as PCP - General (Family Medicine)  CHIEF COMPLAINTS/PURPOSE OF CONSULTATION:  Left breast cancer.  HISTORY OF PRESENTING ILLNESS:  Laurie Moreno 68 y.o. female is seen in consultation today for further work-up and management of newly diagnosed left breast infiltrating ductal carcinoma.  Routine screening mammogram on 10/20/2018 showed possible mass in the left breast.  Ultrasound of the left breast showed irregular hypoechoic mass at 11 o'clock position measuring 0.5 x 0.4 x 0.4 cm with no axillary adenopathy.  She underwent a biopsy on 11/15/2018 which showed invasive ductal carcinoma, grade 2, ER/PR positive, HER-2 negative with Ki-67 of 10%.  She has been living with her sister for the last couple of months.  Prior to that she was in a assisted living facility for the last 2 years.  She is able to do all her activities but requires assistance taking a shower as she has benign essential tremor.  She does not drive at this time.  She attained menarche at age 45.  Menopause at age 57.  She took hormone replacement therapy for 10 years.  Age at first childbirth was 89.  She has a son aged 9, and 2 granddaughters.  She also used oral contraceptive pills for 20 years.  Family history significant for father and mother who had colon cancer.  Paternal grandmother and grandfather also had colon cancer.  Brother died of melanoma.  MEDICAL HISTORY:  Past Medical History:  Diagnosis Date  . Acute pyelonephritis   . Anxiety   . Bipolar disorder, unspecified ([place])    From [facility]  . CHF (congestive heart failure) ([place])    see LOV 06-25-16 epic care everywhere ; "sister reports that patient has a 'weak heart'  but they did testing and she hasnt had to go back since"  . Chronic respiratory failure with hypoxia ([place])   . Complication of anesthesia    reports with last surgery on 09-07-17 it took 3 hours for her to  wake up and she was on the ventilator post operatively for an additional  8 hours   . COPD (chronic obstructive pulmonary disease) ([place])   . Dermatitis   . Dermatitis   . Dysrhythmia   . Elevated temperature 10/26/2017   temp of 99.9 , denies cold/flu sx; reports she gets frwquent UTIs, patient sister accompanying today  reports she is going to Dr Gloriann Loan (surgeon) office today  for pre-op appt  at 2pm  . Essential tremor   . Family history of adverse reaction to anesthesia    sister gets nausea and vomiting   . Fever   . History of ESBL E. coli infection    10-26-17 this RN did not enter this ESBL inf hx; patient and sister deny hx of ESBL infection and there is no supporting lab work for ESBL infection in epic   . Hyperlipidemia   . Hypothyroidism   . Thrombocytopenia ([place])   . Unspecified hydronephrosis   . Urinary incontinence     SURGICAL HISTORY: Past Surgical History:  Procedure Laterality Date  . CARDIAC CATHETERIZATION  07/15/2016   at [place] Left 09/07/2017   Procedure: CYSTOSCOPY WITH RETROGRADE PYELOGRAM/URETERAL STENT PLACEMENT;  Surgeon: Lucas Mallow, MD;  Location: [facility];  Service: Urology;  Laterality: Left;  . EYE SURGERY  5 years ago    bilateral cataract extraction   . eyelid lift Bilateral   .  LAPAROSCOPIC NEPHRECTOMY, HAND ASSISTED Left 11/01/2017   Procedure: LEFT HAND ASSISTED LAPAROSCOPIC NEPHRECTOMY;  Surgeon: Lucas Mallow, MD;  Location: [facility];  Service: Urology;  Laterality: Left;    SOCIAL HISTORY: Social History   Socioeconomic History  . Marital status: Single    Spouse name: Not on file  . Number of children: 1  . Years of education: Not on file  . Highest education level: Not on file  Occupational History  . Occupation: disability  Social Needs  . Financial resource strain: Not hard at all  . Food insecurity    Worry: Never true    Inability: Never true  . Transportation needs     Medical: No    Non-medical: No  Tobacco Use  . Smoking status: Current Some Day Smoker    Packs/day: 0.50    Years: 45.00    Pack years: 22.50    Types: Cigarettes  . Smokeless tobacco: Never Used  . Tobacco comment: cut down no to about 10 cigarettes per week  Substance and Sexual Activity  . Alcohol use: Not Currently  . Drug use: Not Currently  . Sexual activity: Not Currently  Lifestyle  . Physical activity    Days per week: 5 days    Minutes per session: 30 min  . Stress: Not at all  Relationships  . Social connections    Talks on phone: More than three times a week    Gets together: More than three times a week    Attends religious service: 1 to 4 times per year    Active member of club or organization: No    Attends meetings of clubs or organizations: Never    Relationship status: Never married  . Intimate partner violence    Fear of current or ex partner: No    Emotionally abused: No    Physically abused: No    Forced sexual activity: No  Other Topics Concern  . Not on file  Social History Narrative  . Not on file    FAMILY HISTORY: Family History  Problem Relation Age of Onset  . Colon cancer Mother   . Colon cancer Father   . Tremor Father   . Melanoma Brother     ALLERGIES:  is allergic to codeine and penicillins.  MEDICATIONS:  Current Outpatient Medications  Medication Sig Dispense Refill  . albuterol (PROVENTIL HFA;VENTOLIN HFA) 108 (90 Base) MCG/ACT inhaler Inhale 2 puffs into the lungs every 6 (six) hours as needed for wheezing or shortness of breath.     Marland Kitchen aspirin 81 MG chewable tablet Chew 81 mg by mouth every morning.     Marland Kitchen atorvastatin (LIPITOR) 40 MG tablet Take 40 mg by mouth at bedtime.     . carbidopa-levodopa (SINEMET IR) 25-100 MG tablet Take 1 tablet by mouth 3 (three) times daily.     . carvedilol (COREG) 3.125 MG tablet Take 3.125 mg by mouth 2 (two) times daily with a meal.    . clonazePAM (KLONOPIN) 0.5 MG tablet Take 1 tablet  (0.5 mg total) by mouth 2 (two) times daily. (Patient taking differently: Take 0.5 mg by mouth daily. ) 60 tablet 0  . divalproex (DEPAKOTE) 250 MG DR tablet Take 250 mg by mouth 3 (three) times daily. Do not crush    . escitalopram (LEXAPRO) 10 MG tablet Take 20 mg by mouth every morning.     Marland Kitchen esomeprazole (NEXIUM) 20 MG capsule Take 20 mg by mouth every other day.     Marland Kitchen  gabapentin (NEURONTIN) 300 MG capsule Take 300 mg by mouth 4 (four) times daily.    . hydrOXYzine (ATARAX/VISTARIL) 25 MG tablet Take 25 mg by mouth 2 (two) times daily.     Marland Kitchen ipratropium-albuterol (DUONEB) 0.5-2.5 (3) MG/3ML SOLN Take 3 mLs by nebulization every 6 (six) hours as needed (Wheezing/Shortness of Breath).     Marland Kitchen levothyroxine (SYNTHROID, LEVOTHROID) 125 MCG tablet Take 125 mcg by mouth daily before breakfast.    . loratadine (CLARITIN) 10 MG tablet Take 10 mg by mouth daily.     . Multiple Vitamin (MULTIVITAMIN) tablet Take 1 tablet by mouth every morning.     . primidone (MYSOLINE) 50 MG tablet Take 100 mg by mouth at bedtime.     Marland Kitchen zinc oxide 20 % ointment Apply 1 application topically 3 (three) times daily as needed for irritation.     No current facility-administered medications for this visit.     REVIEW OF SYSTEMS:   Constitutional: Denies fevers, chills or abnormal night sweats Eyes: Denies blurriness of vision, double vision or watery eyes Ears, nose, mouth, throat, and face: Denies mucositis or sore throat Respiratory: Denies cough, dyspnea or wheezes Cardiovascular: Denies palpitation, chest discomfort.  Positive for leg swelling. Gastrointestinal:  Denies nausea, heartburn or change in bowel habits Skin: Denies abnormal skin rashes Lymphatics: Denies new lymphadenopathy or easy bruising Neurological:Denies numbness, tingling or new weaknesses Behavioral/Psych: Mood is stable, no new changes  All other systems were reviewed with the patient and are negative.  PHYSICAL EXAMINATION: ECOG  PERFORMANCE STATUS: 1 - Symptomatic but completely ambulatory  Vitals:   11/28/18 1300  BP: 122/76  Pulse: 85  Resp: 16  Temp: 97.7 F (36.5 C)  SpO2: 93%   Filed Weights   11/28/18 1300  Weight: 191 lb 7 oz (86.8 kg)    GENERAL:alert, no distress and comfortable SKIN: skin color, texture, turgor are normal, no rashes or significant lesions EYES: normal, conjunctiva are pink and non-injected, sclera clear OROPHARYNX:no exudate, no erythema and lips, buccal mucosa, and tongue normal  NECK: supple, thyroid normal size, non-tender, without nodularity LYMPH:  no palpable lymphadenopathy in the cervical, axillary or inguinal LUNGS: clear to auscultation and percussion with normal breathing effort HEART: regular rate & rhythm and no murmurs.  1+ edema bilaterally. ABDOMEN:abdomen soft, non-tender and normal bowel sounds Musculoskeletal:no cyanosis of digits and no clubbing  PSYCH: alert & oriented x 3 with fluent speech NEURO: no focal motor/sensory deficits  LABORATORY DATA:  I have reviewed the data as listed Lab Results  Component Value Date   WBC 2.8 (L) 03/24/2018   HGB 13.3 03/24/2018   HCT 41.7 03/24/2018   MCV 95.4 03/24/2018   PLT 91 (L) 03/24/2018     Chemistry      Component Value Date/Time   NA 143 03/24/2018 0551   K 4.3 03/24/2018 0551   CL 109 03/24/2018 0551   CO2 27 03/24/2018 0551   BUN 13 03/24/2018 0551   CREATININE 0.76 03/24/2018 0551      Component Value Date/Time   CALCIUM 8.5 (L) 03/24/2018 0551   ALKPHOS 46 03/24/2018 0551   AST 11 (L) 03/24/2018 0551   ALT 11 03/24/2018 0551   BILITOT 0.9 03/24/2018 0551       RADIOGRAPHIC STUDIES: I have personally reviewed the radiological images as listed and agreed with the findings in the report. US Breast Ltd Uni Left Inc Axilla  Result Date: 11/08/2018 CLINICAL DATA:  Screening recall for left breast mass.  EXAM: DIGITAL DIAGNOSTIC UNILATERAL LEFT MAMMOGRAM WITH CAD AND TOMO LEFT BREAST  ULTRASOUND COMPARISON:  Previous exam(s). ACR Breast Density Category b: There are scattered areas of fibroglandular density. FINDINGS: Additional tomograms were performed of the left breast. There is a spiculated mass in the upper inner left breast measuring approximately 5 mm. Mammographic images were processed with CAD. Physical examination of the upper inner left breast does not reveal any palpable masses. Targeted ultrasound of the left breast was performed. There is a small irregular hypoechoic mass at 11 o'clock 9 cm from nipple posterior depth measuring 0.5 x 0.4 x 0.4 cm. This corresponds well with the mass seen in the left breast at mammography. No lymphadenopathy seen in the left axilla. IMPRESSION: Suspicious left breast mass. RECOMMENDATION: Ultrasound-guided biopsy of the suspicious mass in the left breast at the 11 o'clock position is recommended. This will be scheduled for the patient. I have discussed the findings and recommendations with the patient. Results were also provided in writing at the conclusion of the visit. If applicable, a reminder letter will be sent to the patient regarding the next appointment. BI-RADS CATEGORY  5: Highly suggestive of malignancy. Electronically Signed   By: Everlean Alstrom M.D.   On: 11/08/2018 15:18   Mm Diag Breast Tomo Uni Left  Result Date: 11/08/2018 CLINICAL DATA:  Screening recall for left breast mass. EXAM: DIGITAL DIAGNOSTIC UNILATERAL LEFT MAMMOGRAM WITH CAD AND TOMO LEFT BREAST ULTRASOUND COMPARISON:  Previous exam(s). ACR Breast Density Category b: There are scattered areas of fibroglandular density. FINDINGS: Additional tomograms were performed of the left breast. There is a spiculated mass in the upper inner left breast measuring approximately 5 mm. Mammographic images were processed with CAD. Physical examination of the upper inner left breast does not reveal any palpable masses. Targeted ultrasound of the left breast was performed. There is a  small irregular hypoechoic mass at 11 o'clock 9 cm from nipple posterior depth measuring 0.5 x 0.4 x 0.4 cm. This corresponds well with the mass seen in the left breast at mammography. No lymphadenopathy seen in the left axilla. IMPRESSION: Suspicious left breast mass. RECOMMENDATION: Ultrasound-guided biopsy of the suspicious mass in the left breast at the 11 o'clock position is recommended. This will be scheduled for the patient. I have discussed the findings and recommendations with the patient. Results were also provided in writing at the conclusion of the visit. If applicable, a reminder letter will be sent to the patient regarding the next appointment. BI-RADS CATEGORY  5: Highly suggestive of malignancy. Electronically Signed   By: Everlean Alstrom M.D.   On: 11/08/2018 15:18   Mm Clip Placement Left  Result Date: 11/15/2018 CLINICAL DATA:  Post ultrasound-guided biopsy of a mass in the left breast at the 11 o'clock position. EXAM: DIAGNOSTIC LEFT MAMMOGRAM POST ULTRASOUND BIOPSY COMPARISON:  Previous exam(s). FINDINGS: Mammographic images were obtained following ultrasound guided biopsy of a mass in the left breast at the 11 o'clock position. A ribbon shaped biopsy marking clip is present at the site of the biopsied mass in the left breast at the 11 o'clock position although the mass is not well seen due to post biopsy change in this location. IMPRESSION: Ribbon shaped biopsy marking clip present at site of biopsied mass in the left breast at the 11 o'clock position although the mass is not well seen due to biopsy related change in this location. Final Assessment: Post Procedure Mammograms for Marker Placement Electronically Signed   By: Everlean Alstrom  M.D.   On: 11/15/2018 13:37   [place] Lt Breast Bx W Loc Dev 1st Lesion Img Bx Spec US Guide  Addendum Date: 11/22/2018   ADDENDUM REPORT: 11/18/2018 10:03 ADDENDUM: Pathology revealed Breast, left- INVASIVE DUCTAL CARCINOMA.- DUCTAL CARCINOMA IN SITU.  Concordant: Yes, per Dr. Everlean Alstrom. Pathology results were discussed with the patient by telephone. The patient reported doing well after the biopsy with tenderness at the site. Post biopsy instructions and care were reviewed and questions were answered. The patient was encouraged to call [facility] for any additional concerns. RECOMMENDATION: Request for surgical referral was relayed to Kathi Der on 11/17/2018. by Electa Sniff RN. Message from Kathi Der: Dr. Gillermo Murdoch office will do patient's surgical referral. 11/18/2018. Addendum by Electa Sniff RN on 11/18/2018. Electronically Signed   By: Everlean Alstrom M.D.   On: 11/18/2018 10:03   Result Date: 11/22/2018 CLINICAL DATA:  68 year old female with a suspicious mass in the left breast at the 11 o'clock position. EXAM: ULTRASOUND GUIDED LEFT BREAST CORE NEEDLE BIOPSY COMPARISON:  Previous exam(s). FINDINGS: I met with the patient and we discussed the procedure of ultrasound-guided biopsy, including benefits and alternatives. We discussed the high likelihood of a successful procedure. We discussed the risks of the procedure, including infection, bleeding, tissue injury, clip migration, and inadequate sampling. Informed written consent was given. The usual time-out protocol was performed immediately prior to the procedure. Lesion quadrant: Upper inner Using sterile technique and 1% Lidocaine as local anesthetic, under direct ultrasound visualization, a 14 gauge spring-loaded device was used to perform biopsy of the mass in the left breast at the 11 o'clock position using a medial to lateral approach. At the conclusion of the procedure a ribbon shaped tissue marker clip was deployed into the biopsy cavity. Follow up 2 view mammogram was performed and dictated separately. IMPRESSION: Ultrasound guided biopsy of the mass in the left breast at the 11 o'clock position. No apparent complications. Electronically Signed: By: Everlean Alstrom  M.D. On: 11/15/2018 13:38    ASSESSMENT & PLAN:  Breast cancer ([place]) 1.  Left breast infiltrating ductal carcinoma: - Screening mammogram on 10/20/2018 showed a possible mass in the left breast.  Ultrasound of the left breast showed irregular hypoechoic mass at 11 o'clock position measuring 0.5 x 0.4 x 0.4 cm.  No lymphadenopathy in the left axilla. - Biopsy on 11/15/2018 consistent with invasive ductal carcinoma, grade 2, ER/PR positive, HER-2 negative, Ki-67 10%. -She was seen by Dr. Arnoldo Morale on 11/22/2018 and various surgical options were discussed. -She was reportedly in a assisted living facility for the last 2 years, has been living with her sister for the last couple of months. - I have discussed the pathology report in detail with the patient and her sister.  We have also discussed various surgical options including lumpectomy with SLNB and modified radical mastectomy. -Given her comorbidities, I think a modified radical mastectomy is a very reasonable option. - She has an appointment to see Dr. Arnoldo Morale on 12/13/2018.  I will see her back 4 weeks after surgery.  We will plan to start her on aromatase inhibitor therapy.  We will also obtain DEXA scan.  2.  Cytopenias: - CT of the abdomen and pelvis in April 2019 showed nodular liver consistent with mild cirrhosis.  Spleen was normal. -She has thrombocytopenia ranging between 78-272 since 2019. -She also has mild leukopenia with decreased neutrophil count on 2-3 occasions. -She does not have any history of alcohol use.  We will make a referral to hepatology clinic.  3.  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  Glomerular sclerosis and tubular atrophy. -Nephrectomy was reportedly done due to recurrent infections and a nonfunctioning kidney.  4.  Tobacco abuse: -He smokes about half pack of cigarettes per day for the past 45 years.  5.  F amily history: - Mother and  father had colon cancer.  Paternal grandmother and paternal grandfather also had colon cancer.  Brother died of melanoma.  No orders of the defined types were placed in this encounter.   All questions were answered. The patient knows to call the clinic with any problems, questions or concerns.      Derek Jack, MD 11/28/2018 2:00 PM

## 2018-11-28 NOTE — Patient Instructions (Signed)
[place] at [facility] Discharge Instructions  You were seen today by Dr. Delton Coombes. He went over your history, family history and how you've been feeling lately. He will refer you to GI for your liver disease. He will see you back after surgery for follow up.   Thank you for choosing [place] at [facility] to provide your oncology and hematology care.  To afford each patient quality time with our provider, please arrive at least 15 minutes before your scheduled appointment time.   If you have a lab appointment with the [facility] please come in thru the  Main Entrance and check in at the main information desk  You need to re-schedule your appointment should you arrive 10 or more minutes late.  We strive to give you quality time with our providers, and arriving late affects you and other patients whose appointments are after yours.  Also, if you no show three or more times for appointments you may be dismissed from the clinic at the providers discretion.     Again, thank you for choosing [facility].  Our hope is that these requests will decrease the amount of time that you wait before being seen by our physicians.       _____________________________________________________________  Should you have questions after your visit to [facility], please contact our office at [phone] between the hours of 8:00 a.m. and 4:30 p.m.  Voicemails left after 4:00 p.m. will not be returned until the following business day.  For prescription refill requests, have your pharmacy contact our office and allow 72 hours.    Cancer Center Support Programs:   > Cancer Support Group  2nd Tuesday of the month 1pm-2pm, Journey Room

## 2018-12-01 ENCOUNTER — Encounter: Payer: Self-pay | Admitting: Gastroenterology

## 2018-12-06 ENCOUNTER — Encounter (HOSPITAL_COMMUNITY): Payer: Self-pay | Admitting: *Deleted

## 2018-12-06 NOTE — Progress Notes (Signed)
Patient called today having questions about her options for surgery. I explained to her the two options of lumpectomy and mastectomy.  I explained why radiation is needed with one and not the other. I explained to her the reasons behind them.  She asked questions about her chances of cancer coming back and if she needed to have both breasts removed.  I told her those type questions should be directed to Dr. Arnoldo Morale when she has her appt with him next week.  She and her sister both were able to ask all their questions and they were answered to their satisfaction.  They were encouraged to write down their questions that come up within the next week and take it with them to the appt.  I also encourage Laurie Moreno to call me back if she has any further questions or needs my help with anything involving her care. She verbalizes appreciation and understanding.

## 2018-12-12 ENCOUNTER — Encounter (HOSPITAL_COMMUNITY): Payer: Self-pay | Admitting: *Deleted

## 2018-12-12 NOTE — Progress Notes (Addendum)
Patient called today asking if she could get the pneumonia shot.  She did get her flu shot but held off on getting the pneumonia to be sure it was okay.  I have sent a message to the provider to make sure this is acceptable.

## 2018-12-12 NOTE — Progress Notes (Signed)
Per Harriet Pho, NP okay for patient to have pneumonia shot, patient is aware. She will get it at her local pharmacy.

## 2018-12-13 ENCOUNTER — Ambulatory Visit (INDEPENDENT_AMBULATORY_CARE_PROVIDER_SITE_OTHER): Payer: Medicare Other | Admitting: General Surgery

## 2018-12-13 ENCOUNTER — Other Ambulatory Visit: Payer: Self-pay

## 2018-12-13 ENCOUNTER — Encounter: Payer: Self-pay | Admitting: General Surgery

## 2018-12-13 VITALS — BP 118/80 | HR 73 | Temp 97.8°F | Resp 16 | Ht 64.0 in | Wt 187.0 lb

## 2018-12-13 DIAGNOSIS — C50212 Malignant neoplasm of upper-inner quadrant of left female breast: Secondary | ICD-10-CM

## 2018-12-13 NOTE — Progress Notes (Signed)
Subjective:     Laurie Moreno  Here for follow-up to discuss surgical options for her left breast cancer.  She did talk with Dr. Delton Coombes of oncology and would like to proceed with a left modified radical mastectomy.  She has been sent to gastroenterology for work-up of possible cirrhosis and her thrombocytopenia.  She will be seeing them on 01/05/2019. Objective:    BP 118/80 (BP Location: Right Arm, Patient Position: Sitting, Cuff Size: Normal)   Pulse 73   Temp 97.8 F (36.6 C) (Tympanic)   Resp 16   Ht 5\' 4"  (1.626 m)   Wt 187 lb (84.8 kg)   SpO2 92%   BMI 32.10 kg/m   General:  alert, cooperative and no distress       Assessment:    Left breast carcinoma.  Thrombocytopenia of unknown etiology.    Plan:   Patient would like to proceed with a left modified radical mastectomy after being evaluated by gastroenterology, which is fine with me.  The risks and benefits of the procedure including bleeding, infection, cardiopulmonary difficulties, and poor wound healing were fully explained to the patient, who gave informed consent.  She will call our office after she has been seen by gastroenterology.

## 2018-12-13 NOTE — Patient Instructions (Signed)
Call to make appointment with Dr. Arnoldo Morale after seeing the GI doctor about your liver.

## 2018-12-23 ENCOUNTER — Other Ambulatory Visit: Payer: Self-pay

## 2018-12-23 ENCOUNTER — Encounter: Payer: Self-pay | Admitting: Gastroenterology

## 2018-12-23 ENCOUNTER — Ambulatory Visit (INDEPENDENT_AMBULATORY_CARE_PROVIDER_SITE_OTHER): Payer: Medicare Other | Admitting: Gastroenterology

## 2018-12-23 VITALS — BP 112/79 | HR 77 | Temp 96.9°F | Ht 64.0 in | Wt 184.4 lb

## 2018-12-23 DIAGNOSIS — D696 Thrombocytopenia, unspecified: Secondary | ICD-10-CM

## 2018-12-23 DIAGNOSIS — K7460 Unspecified cirrhosis of liver: Secondary | ICD-10-CM

## 2018-12-23 NOTE — Assessment & Plan Note (Addendum)
Very pleasant 68 year old female with recent diagnosis of left breast infiltrating ductal carcinoma with plans for a left modified radical mastectomy in the near future.  She was noted to have thrombocytopenia dating back to at least the beginning of 2019, ranging from 78,000 to 272,000.  No recent labs.  CT abdomen pelvis in April 2019 showed nodular liver consistent with mild cirrhosis, spleen was normal at that time.  At this point would recommend update labs as well as ultrasound (hepatoma screening) in order to gather more information regarding hepatic function and current platelet status.  Baseline cirrhosis care.  We will have more information regarding current hepatic function and perioperative risk for surgery however, I feel like she would be low risk given her performance with left nephrectomy last year.  Would consider updating colonoscopy once she has completed her breast cancer treatments, given significant family history of colon cancer and patient's last colonoscopy being 5 years ago.

## 2018-12-23 NOTE — Patient Instructions (Signed)
1. Please go for labs today or Monday.  2. Ultrasound of your liver as scheduled.  3. We will call with results as soon as available.

## 2018-12-23 NOTE — Progress Notes (Signed)
Primary Care Physician:  Zhou-Talbert, [name], MD Referring provider: Dr. Delton Coombes Primary Gastroenterologist:  Barney Drain, MD   Chief Complaint  Patient presents with  . Cirrhosis    HPI:  Laurie Moreno is a 68 y.o. female here at the request of Dr. Delton Coombes for further evaluation of cirrhosis.  Patient recently diagnosed with left breast infiltrating ductal carcinoma with plans for left modified radical mastectomy.  According to Dr. Arnoldo Morale note, she has postponed scheduling until she was able to come see [place].  Patient presents with questions and concerns about having cirrhosis.  She reports that she never consumed alcohol on a regular basis.  She is not aware of any family history of liver disease.  She wonders if her liver disease was related to any particular medication she has been on.  Reports that she used to be on chronic high-dose medications for insomnia but she was able to come off of those.  Denies history of diabetes.  She has been living with her sister for 2 months, eating more healthy and more exercise.  She states she is been able to drop a few pounds.  She denies any abdominal pain.  She has had some mild lower extremity edema.  Bowel movements are regular.  No melena or rectal bleeding.  Denies any upper GI symptoms.  She is on a chronic PPI for reflux.  CT of the abdomen pelvis April 2019 showed nodular liver consistent with mild cirrhosis.  Spleen was normal.  Thrombocytopenia ranging between 78,000 to 272,000 since 2019.  She had a nephrectomy in July 2019 for left chronic pyelonephritis with nonfunctioning kidney.  She reports that she tolerated surgery well with no evidence of significant perioperative or postoperative bleeding.    She had a colonoscopy by Dr. Aaron Edelman C. Bobby Rumpf at [place] on December 20, 2013.  He did have a long tortuous colon.  Nonbleeding external hemorrhoids noted.  Questionable small subcentimeter benign/hyperplastic  polyp in the ascending colon, not removed.  Advised to repeat colonoscopy in 4 years.      Current Outpatient Medications  Medication Sig Dispense Refill  . aspirin 81 MG chewable tablet Chew 81 mg by mouth every morning.     Marland Kitchen atorvastatin (LIPITOR) 40 MG tablet Take 40 mg by mouth at bedtime.     . carbidopa-levodopa (SINEMET IR) 25-100 MG tablet Take 1 tablet by mouth 3 (three) times daily.     . carvedilol (COREG) 3.125 MG tablet Take 3.125 mg by mouth 2 (two) times daily with a meal.    . clonazePAM (KLONOPIN) 0.5 MG tablet Take 1 tablet (0.5 mg total) by mouth 2 (two) times daily. (Patient taking differently: Take 0.5 mg by mouth daily. ) 60 tablet 0  . divalproex (DEPAKOTE) 250 MG DR tablet Take 250 mg by mouth 3 (three) times daily. Do not crush    . escitalopram (LEXAPRO) 10 MG tablet Take 20 mg by mouth every morning.     Marland Kitchen esomeprazole (NEXIUM) 20 MG capsule Take 20 mg by mouth every other day.     . gabapentin (NEURONTIN) 300 MG capsule Take 300 mg by mouth 4 (four) times daily.    . hydrOXYzine (ATARAX/VISTARIL) 25 MG tablet Take 25 mg by mouth 2 (two) times daily.     Marland Kitchen levothyroxine (SYNTHROID, LEVOTHROID) 125 MCG tablet Take 125 mcg by mouth daily before breakfast.    . loratadine (CLARITIN) 10 MG tablet Take 10 mg by mouth daily.     Marland Kitchen  Multiple Vitamin (MULTIVITAMIN) tablet Take 1 tablet by mouth every morning.     . primidone (MYSOLINE) 50 MG tablet Take 100 mg by mouth at bedtime.      No current facility-administered medications for this visit.     Allergies as of 12/23/2018 - Review Complete 12/23/2018  Allergen Reaction Noted  . Codeine Nausea And Vomiting 04/02/2011  . Penicillins Hives, Rash, and Other (See Comments) 04/02/2011    Past Medical History:  Diagnosis Date  . Acute pyelonephritis   . Anxiety   . Bipolar disorder, unspecified ([place])    From [facility] form  . CHF (congestive heart failure) ([place])    see LOV 06-25-16 epic care everywhere ; "sister  reports that patient has a 'weak heart'  but they did testing and she hasnt had to go back since"  . Chronic respiratory failure with hypoxia ([place])   . Complication of anesthesia    reports with last surgery on 09-07-17 it took 3 hours for her to wake up and she was on the ventilator post operatively for an additional  8 hours   . COPD (chronic obstructive pulmonary disease) ([place])   . Dermatitis   . Dermatitis   . Dysrhythmia   . Elevated temperature 10/26/2017   temp of 99.9 , denies cold/flu sx; reports she gets frwquent UTIs, patient sister accompanying today  reports she is going to Dr Gloriann Loan (surgeon) office today  for pre-op appt  at 2pm  . Essential tremor   . Family history of adverse reaction to anesthesia    sister gets nausea and vomiting   . Fever   . History of ESBL E. coli infection    10-26-17 this RN did not enter this ESBL inf hx; patient and sister deny hx of ESBL infection and there is no supporting lab work for ESBL infection in epic   . Hyperlipidemia   . Hypothyroidism   . Thrombocytopenia ([place])   . Unspecified hydronephrosis   . Urinary incontinence     Past Surgical History:  Procedure Laterality Date  . CARDIAC CATHETERIZATION  07/15/2016   at [facility]  . COLONOSCOPY  12/2013   Dr. Aaron Edelman C. Geno Sydnor at [facility], long tortuous colon, nonbleeding external hemorrhoids, questionable small subcentimeter benign/hyperplastic polyp in the ascending colon, not removed.  Advised to have repeat colonoscopy in 4 years.  . CYSTOSCOPY W/ URETERAL STENT PLACEMENT Left 09/07/2017   Procedure: CYSTOSCOPY WITH RETROGRADE PYELOGRAM/URETERAL STENT PLACEMENT;  Surgeon: Lucas Mallow, MD;  Location: [facility];  Service: Urology;  Laterality: Left;  . EYE SURGERY  5 years ago    bilateral cataract extraction   . eyelid lift Bilateral   . LAPAROSCOPIC NEPHRECTOMY, HAND ASSISTED Left 11/01/2017   Procedure: LEFT HAND ASSISTED LAPAROSCOPIC NEPHRECTOMY;  Surgeon:  Lucas Mallow, MD;  Location: [facility];  Service: Urology;  Laterality: Left;    Family History  Problem Relation Age of Onset  . Colon cancer Mother   . Colon cancer Father   . Tremor Father   . Melanoma Brother   . Colon cancer Paternal Grandmother   . Colon cancer Paternal Grandfather     Social History   Socioeconomic History  . Marital status: Single    Spouse name: Not on file  . Number of children: 1  . Years of education: Not on file  . Highest education level: Not on file  Occupational History  . Occupation: disability  Social Needs  .  Financial resource strain: Not hard at all  . Food insecurity    Worry: Never true    Inability: Never true  . Transportation needs    Medical: No    Non-medical: No  Tobacco Use  . Smoking status: Current Some Day Smoker    Packs/day: 0.50    Years: 45.00    Pack years: 22.50    Types: Cigarettes  . Smokeless tobacco: Never Used  . Tobacco comment: cut down no to about 10 cigarettes per week  Substance and Sexual Activity  . Alcohol use: Not Currently    Comment: no prior history heavy etoh use  . Drug use: Not Currently  . Sexual activity: Not Currently  Lifestyle  . Physical activity    Days per week: 5 days    Minutes per session: 30 min  . Stress: Not at all  Relationships  . Social connections    Talks on phone: More than three times a week    Gets together: More than three times a week    Attends religious service: 1 to 4 times per year    Active member of club or organization: No    Attends meetings of clubs or organizations: Never    Relationship status: Never married  . Intimate partner violence    Fear of current or ex partner: No    Emotionally abused: No    Physically abused: No    Forced sexual activity: No  Other Topics Concern  . Not on file  Social History Narrative  . Not on file      ROS:  General: Negative for anorexia, weight loss, fever, chills, fatigue, weakness. Eyes: Negative  for vision changes.  ENT: Negative for hoarseness, difficulty swallowing , nasal congestion. CV: Negative for chest pain, angina, palpitations, dyspnea on exertion, peripheral edema.  Respiratory: Negative for dyspnea at rest, dyspnea on exertion, cough, sputum, wheezing.  GI: See history of present illness. GU:  Negative for dysuria, hematuria, urinary incontinence, urinary frequency, nocturnal urination.  MS: Negative for joint pain, low back pain.  Derm: Negative for rash or itching.  Neuro: Negative for weakness, abnormal sensation, seizure, frequent headaches, memory loss, confusion.  Benign essential tremor Psych: Negative for anxiety, depression, suicidal ideation, hallucinations.  Endo: Negative for unusual weight change.  Heme: Negative for bruising or bleeding. Allergy: Negative for rash or hives.    Physical Examination:  BP 112/79   Pulse 77   Temp (!) 96.9 F (36.1 C)   Ht 5\' 4"  (1.626 m)   Wt 184 lb 6.4 oz (83.6 kg)   BMI 31.65 kg/m    General: Well-nourished, well-developed in no acute distress.  Head: Normocephalic, atraumatic.   Eyes: Conjunctiva pink, no icterus. Mouth: masked Neck: Supple without thyromegaly, masses, or lymphadenopathy.  Lungs: Clear to auscultation bilaterally.  Heart: Regular rate and rhythm, no murmurs rubs or gallops.  Abdomen: Bowel sounds are normal, nontender, nondistended, no hepatosplenomegaly or masses, no abdominal bruits or    hernia , no rebound or guarding.   Rectal: Not performed Extremities: No lower extremity edema. No clubbing or deformities.  Neuro: Alert and oriented x 4 , grossly normal neurologically.  Tremor with movement, none at rest Skin: Warm and dry, no rash or jaundice.   Psych: Alert and cooperative, normal mood and affect.  Labs: Lab Results  Component Value Date   CREATININE 0.76 03/24/2018   BUN 13 03/24/2018   NA 143 03/24/2018   K 4.3 03/24/2018  CL 109 03/24/2018   CO2 27 03/24/2018   Lab  Results  Component Value Date   ALT 11 03/24/2018   AST 11 (L) 03/24/2018   ALKPHOS 46 03/24/2018   BILITOT 0.9 03/24/2018   Lab Results  Component Value Date   WBC 2.8 (L) 03/24/2018   HGB 13.3 03/24/2018   HCT 41.7 03/24/2018   MCV 95.4 03/24/2018   PLT 91 (L) 03/24/2018     Imaging Studies: No results found.  CT abdomen and pelvis with contrast from April 2019 COMPARISON:  08/12/2015  FINDINGS: Lower chest: Lung bases are well aerated with stable right lower lobe nodule when compared with the prior exam.  Hepatobiliary: Liver demonstrates some nodularity consistent with mild underlying cirrhosis. No focal mass is seen. Gallbladder is within normal limits.  Pancreas: Unremarkable. No pancreatic ductal dilatation or surrounding inflammatory changes.  Spleen: Normal in size without focal abnormality.  Adrenals/Urinary Tract: Adrenal glands are within normal limits. Kidneys are well visualized bilaterally. The right kidney demonstrates no renal calculi or obstructive change. The left kidney demonstrates considerable perinephric stranding as well as hydronephrosis without evidence of significant hydroureter. This is consistent with a ureteropelvic junction obstruction and is similar to that seen on the prior examination. The perinephric changes likely represent a component of pyelonephritis. Some mottled enhancement is noted in the upper pole on the left. Some slight increased attenuation in the distal ureter on the left is noted. Again no stone is identified.  Stomach/Bowel: The appendix is not well visualized. No obstructive or inflammatory changes are seen.  Vascular/Lymphatic: No significant vascular findings are present. No enlarged abdominal or pelvic lymph nodes.  Reproductive: Scattered small fibroids are noted.  Other: No abdominal wall hernia or abnormality. No abdominopelvic ascites.  Musculoskeletal: Degenerative changes of lumbar spine  are noted.  IMPRESSION: Changes consistent with left UPJ obstruction. Associated inflammatory changes and mottled enhancement in the left kidney are noted suggestive of pyelonephritis.  Mild cirrhotic change  Uterine fibroids.   Electronically Signed   By: Inez Catalina M.D.   On: 08/21/2017 19:08

## 2018-12-26 ENCOUNTER — Telehealth: Payer: Self-pay

## 2018-12-26 LAB — CBC WITH DIFFERENTIAL/PLATELET
Absolute Monocytes: 795 cells/uL (ref 200–950)
Basophils Absolute: 52 cells/uL (ref 0–200)
Basophils Relative: 0.9 %
Eosinophils Absolute: 58 cells/uL (ref 15–500)
Eosinophils Relative: 1 %
HCT: 44.6 % (ref 35.0–45.0)
Hemoglobin: 15.2 g/dL (ref 11.7–15.5)
Lymphs Abs: 3080 cells/uL (ref 850–3900)
MCH: 31.3 pg (ref 27.0–33.0)
MCHC: 34.1 g/dL (ref 32.0–36.0)
MCV: 91.8 fL (ref 80.0–100.0)
MPV: 9.7 fL (ref 7.5–12.5)
Monocytes Relative: 13.7 %
Neutro Abs: 1815 cells/uL (ref 1500–7800)
Neutrophils Relative %: 31.3 %
Platelets: 136 10*3/uL — ABNORMAL LOW (ref 140–400)
RBC: 4.86 10*6/uL (ref 3.80–5.10)
RDW: 12.9 % (ref 11.0–15.0)
Total Lymphocyte: 53.1 %
WBC: 5.8 10*3/uL (ref 3.8–10.8)

## 2018-12-26 LAB — HEPATITIS C ANTIBODY
Hepatitis C Ab: NONREACTIVE
SIGNAL TO CUT-OFF: 0.02 (ref <1.00)

## 2018-12-26 LAB — PROTIME-INR
INR: 1.1
Prothrombin Time: 11 s (ref 9.0–11.5)

## 2018-12-26 LAB — COMPREHENSIVE METABOLIC PANEL
AG Ratio: 2.3 (calc) (ref 1.0–2.5)
ALT: 11 U/L (ref 6–29)
AST: 13 U/L (ref 10–35)
Albumin: 4.5 g/dL (ref 3.6–5.1)
Alkaline phosphatase (APISO): 43 U/L (ref 37–153)
BUN: 17 mg/dL (ref 7–25)
CO2: 29 mmol/L (ref 20–32)
Calcium: 9.5 mg/dL (ref 8.6–10.4)
Chloride: 104 mmol/L (ref 98–110)
Creat: 0.79 mg/dL (ref 0.50–0.99)
Globulin: 2 g/dL (calc) (ref 1.9–3.7)
Glucose, Bld: 77 mg/dL (ref 65–139)
Potassium: 4.9 mmol/L (ref 3.5–5.3)
Sodium: 142 mmol/L (ref 135–146)
Total Bilirubin: 1.2 mg/dL (ref 0.2–1.2)
Total Protein: 6.5 g/dL (ref 6.1–8.1)

## 2018-12-26 LAB — HEPATITIS B CORE ANTIBODY, TOTAL: Hep B Core Total Ab: NONREACTIVE

## 2018-12-26 LAB — IRON,TIBC AND FERRITIN PANEL
%SAT: 26 % (calc) (ref 16–45)
Ferritin: 107 ng/mL (ref 16–288)
Iron: 87 ug/dL (ref 45–160)
TIBC: 340 mcg/dL (calc) (ref 250–450)

## 2018-12-26 LAB — HEPATITIS B SURFACE ANTIGEN: Hepatitis B Surface Ag: NONREACTIVE

## 2018-12-26 LAB — HEPATITIS B SURFACE ANTIBODY,QUALITATIVE: Hep B S Ab: NONREACTIVE

## 2018-12-26 LAB — HEPATITIS A ANTIBODY, TOTAL: Hepatitis A AB,Total: REACTIVE — AB

## 2018-12-26 LAB — CERULOPLASMIN: Ceruloplasmin: 28 mg/dL (ref 18–53)

## 2018-12-26 NOTE — Telephone Encounter (Signed)
[place] abd complete scheduled for 12/29/18 at 8:30am, arrive at 8:15am. NPO after midnight prior to test. Called and informed pt of appt. Letter mailed.  No PA needed for [place] per [employer] website: NOTE: [facility] member does not require prior authorization for OUTPATIENT Radiology through [place] or [facility] at this time.

## 2018-12-29 ENCOUNTER — Other Ambulatory Visit: Payer: Self-pay

## 2018-12-29 ENCOUNTER — Ambulatory Visit (HOSPITAL_COMMUNITY)
Admission: RE | Admit: 2018-12-29 | Discharge: 2018-12-29 | Disposition: A | Discharge status: Home / Self Care | Payer: Medicare Other | Source: Ambulatory Visit | Attending: Gastroenterology | Admitting: Gastroenterology

## 2018-12-29 DIAGNOSIS — K7460 Unspecified cirrhosis of liver: Secondary | ICD-10-CM | POA: Insufficient documentation

## 2018-12-29 DIAGNOSIS — D696 Thrombocytopenia, unspecified: Secondary | ICD-10-CM | POA: Diagnosis not present

## 2018-12-30 ENCOUNTER — Telehealth: Payer: Self-pay | Admitting: *Deleted

## 2018-12-30 NOTE — Telephone Encounter (Signed)
Pt said she was told at the hospital that results would be back yesterday afternoon is the reason she is calling. I explained that we normally call within 7-10 business days.  Forwarding to Neil Crouch, PA to advise!

## 2018-12-30 NOTE — Telephone Encounter (Signed)
Pt wants u/s results. [phone]

## 2019-01-02 NOTE — Telephone Encounter (Signed)
See result notes. 

## 2019-01-03 ENCOUNTER — Encounter: Payer: Self-pay | Admitting: Gastroenterology

## 2019-01-03 ENCOUNTER — Telehealth: Payer: Self-pay | Admitting: *Deleted

## 2019-01-03 DIAGNOSIS — C50919 Malignant neoplasm of unspecified site of unspecified female breast: Secondary | ICD-10-CM

## 2019-01-03 HISTORY — DX: Malignant neoplasm of unspecified site of unspecified female breast: C50.919

## 2019-01-03 HISTORY — PX: MASTECTOMY: SHX3

## 2019-01-03 NOTE — Telephone Encounter (Signed)
PT said she was told she could wait til after her surgery to have Hep B shots.  She will discuss at [facility] in March 2021.

## 2019-01-03 NOTE — Telephone Encounter (Signed)
As per my result note, she can wait until after her breast cancer surgery/treatments to start Hep B vaccinations. We can discuss at her return visit.

## 2019-01-03 NOTE — Progress Notes (Signed)
SCHEDULED AND LETTER SENT  °

## 2019-01-03 NOTE — Telephone Encounter (Signed)
PT is aware.

## 2019-01-03 NOTE — Telephone Encounter (Signed)
Noted! I have discussed results with her.

## 2019-01-03 NOTE — Telephone Encounter (Signed)
Pt wants to ask you a question when you get a moment.  Please call her back at [phone].

## 2019-01-05 ENCOUNTER — Ambulatory Visit: Payer: Medicare Other | Admitting: Nurse Practitioner

## 2019-01-10 ENCOUNTER — Encounter: Payer: Self-pay | Admitting: General Surgery

## 2019-01-10 ENCOUNTER — Ambulatory Visit (INDEPENDENT_AMBULATORY_CARE_PROVIDER_SITE_OTHER): Payer: Medicare Other | Admitting: General Surgery

## 2019-01-10 ENCOUNTER — Other Ambulatory Visit: Payer: Self-pay

## 2019-01-10 VITALS — BP 120/80 | HR 79 | Temp 97.7°F | Resp 16 | Ht 64.0 in | Wt 186.0 lb

## 2019-01-10 DIAGNOSIS — C50212 Malignant neoplasm of upper-inner quadrant of left female breast: Secondary | ICD-10-CM | POA: Diagnosis not present

## 2019-01-10 NOTE — Patient Instructions (Addendum)
Total or Modified Radical Mastectomy A total mastectomy and a modified radical mastectomy are surgeries that are done as part of treatment for breast cancer. You will have one of those types of surgery. Both types involve removing a breast.  In a total mastectomy (simple mastectomy), all breast tissue including the nipple will be removed.  In a modified radical mastectomy, lymph nodes under the arm will be removed along with the breast and nipple. Some of the lining over the muscle tissues under the breast may also be removed. These procedures may also be used to help prevent breast cancer. A preventive (prophylactic) mastectomy may be done if you are at an increased risk of breast cancer due to harmful changes (mutations) in certain genes (BRCA genes). In that case, the procedure involves removing both of your breasts. This can reduce your risk of developing breast cancer in the future. For a transgender person, a total mastectomy may be done as part of a surgical transition from female to female. Let your health care provider know about:  Any allergies you have.  All medicines you are taking, including vitamins, herbs, eye drops, creams, and over-the-counter medicines.  Any problems you or family members have had with anesthetic medicines.  Any blood disorders you have.  Any surgeries you have had.  Any medical conditions you have.  Whether you are pregnant or may be pregnant. What are the risks? Generally, this is a safe procedure. However, problems may occur, including:  Pain.  Infection.  Bleeding.  Allergic reactions to medicines.  Scar tissue.  Chest numbness on the side of the surgery.  Fluid buildup under the skin flaps where your breast was removed (seroma).  Sensation of throbbing or tingling.  Stress or sadness from losing your breast. If you have the lymph nodes under your arm removed, you may have arm swelling, weakness, or numbness on the same side of your body  as your surgery. What happens before the procedure?  Medicines  Ask your health care provider about: ? Changing or stopping your regular medicines. This is especially important if you are taking diabetes medicines or blood thinners. ? Taking medicines such as aspirin and ibuprofen. These medicines can thin your blood. Do not take these medicines unless your health care provider tells you to take them. ? Taking over-the-counter medicines, vitamins, herbs, and supplements.  Your health care team may give you antibiotic medicine to help prevent infection. General instructions  You may be checked for extra fluid around your lymph nodes (lymphedema).  Plan to have someone take you home from the hospital or clinic.  Plan to have a responsible adult care for you for at least 24 hours after you leave the hospital or clinic. This is important.  Ask your health care provider how your surgical site will be marked or identified.  You may be asked to shower with a germ-killing soap. What happens during the procedure?   To lower your risk of infection: ? Your health care team will wash or sanitize their hands. ? Your skin will be washed with soap.  An IV will be inserted into one of your veins.  You will be given a medicine to make you fall asleep (general anesthetic).  A wide incision will be made around your nipple. The skin and nipple inside the incision will be removed along with all breast tissue.  If you are having a modified radical mastectomy: ? The lining over your chest muscles will be removed. ? The incision  may be extended to reach the lymph nodes under your arm, or a second incision may be made. ? Lymph nodes will be removed.  Breast tissue and lymph nodes that are removed will be sent to the lab for testing.  You may have a drainage tube inserted into your incision to collect fluid that builds up after surgery. This tube will be connected to a suction bulb on the outside of  your body to remove the fluid.  Your incision or incisions will be closed with stitches (sutures).  A bandage (dressing) will be placed over your breast area. If lymph nodes were removed, a dressing will also be placed under your arm. The procedure may vary among health care providers and hospitals. What happens after the procedure?  Your blood pressure, heart rate, breathing rate, and blood oxygen level will be monitored until the medicines you were given have worn off.  You will be given pain medicine as needed.  You will be encouraged to get up and walk as soon as you can.  Your IV can be removed when you are able to eat and drink.  You may have a drainage tube in place for 2-3 days to prevent a collection of blood (hematoma) from developing in the breast area. You will be given instructions about caring for the drain before you go home.  A pressure bandage may be applied for 1-2 days to prevent bleeding or swelling. Ask your health care provider how to care for your pressure bandage at home. Summary  In a total mastectomy (simple mastectomy), all breast tissue including the nipple will be removed. In a modified radical mastectomy, the lymph nodes under the arm will be removed along with the breast and nipple.  Before the procedure, follow instructions from your health care provider about eating and drinking, and ask about changing or stopping your regular medicines.  You will be given a medicine to make you fall asleep (general anesthetic) during the procedure. This information is not intended to replace advice given to you by your health care provider. Make sure you discuss any questions you have with your health care provider. Document Released: 01/13/2001 Document Revised: 06/24/2018 Document Reviewed: 01/22/2017 Elsevier Patient Education  2020 [employer].

## 2019-01-10 NOTE — Progress Notes (Signed)
Subjective:     Laurie Moreno  Here to follow up and schedule surgery. Objective:    BP 120/80 (BP Location: Right Arm, Patient Position: Sitting, Cuff Size: Normal)   Pulse 79   Temp 97.7 F (36.5 C) (Tympanic)   Resp 16   Ht 5\' 4"  (1.626 m)   Wt 186 lb (84.4 kg)   SpO2 90%   BMI 31.93 kg/m   General:  alert, cooperative and no distress      GI notes reviewed. Assessment:    Left breast cancer, thrombocytopenia resolved    Plan:  Patient is scheduled for left modified radical mastectomy on 01/23/2019.  The risks and benefits of the procedure including bleeding, infection, cardiopulmonary difficulties, the possibility of a blood transfusion, and the possibility of arm swelling were fully explained to the patient, who gave informed consent.

## 2019-01-11 NOTE — H&P (Signed)
Laurie Moreno; [id number]; 06/25/1950   HPI Patient is a 68-year-old white female who was referred to my care by [facility] for evaluation treatment of a left breast cancer.  This was found on routine mammography.  Patient does not feel a lump.  She denies any family history of breast cancer.  She has had no nipple discharge.  Core biopsy of a 6 mm lesion in the upper, inner quadrant of the left breast reveals invasive ductal carcinoma, ER positive, PR positive, HER-2 negative.  Patient is referred for further surgical evaluation.  Patient is somewhat a poor historian due to history of mental deficiency. Past Medical History:  Diagnosis Date  . Acute pyelonephritis   . Anxiety   . Bipolar disorder, unspecified (HCC)    From [place] form  . CHF (congestive heart failure) (HCC)    see LOV 06-25-16 epic care everywhere ; "sister reports that patient has a 'weak heart'  but they did testing and she hasnt had to go back since"  . Chronic respiratory failure with hypoxia (HCC)   . Complication of anesthesia    reports with last surgery on 09-07-17 it took 3 hours for her to wake up and she was on the ventilator post operatively for an additional  8 hours   . COPD (chronic obstructive pulmonary disease) (HCC)   . Dermatitis   . Dermatitis   . Dysrhythmia   . Elevated temperature 10/26/2017   temp of 99.9 , denies cold/flu sx; reports she gets frwquent UTIs, patient sister accompanying today  reports she is going to Dr Bell (surgeon) office today  for pre-op appt  at 2pm  . Essential tremor   . Family history of adverse reaction to anesthesia    sister gets nausea and vomiting   . Fever   . History of ESBL E. coli infection    10-26-17 this RN did not enter this ESBL inf hx; patient and sister deny hx of ESBL infection and there is no supporting lab work for ESBL infection in epic   . Hyperlipidemia   . Hypothyroidism   . Thrombocytopenia (HCC)   . Unspecified hydronephrosis    . Urinary incontinence     Past Surgical History:  Procedure Laterality Date  . CARDIAC CATHETERIZATION  07/15/2016   at [facility]  . CYSTOSCOPY W/ URETERAL STENT PLACEMENT Left 09/07/2017   Procedure: CYSTOSCOPY WITH RETROGRADE PYELOGRAM/URETERAL STENT PLACEMENT;  Surgeon: Bell, Eugene D III, MD;  Location: [facility];  Service: Urology;  Laterality: Left;  . EYE SURGERY  5 years ago    bilateral cataract extraction   . LAPAROSCOPIC NEPHRECTOMY, HAND ASSISTED Left 11/01/2017   Procedure: LEFT HAND ASSISTED LAPAROSCOPIC NEPHRECTOMY;  Surgeon: Bell, Eugene D III, MD;  Location: [facility];  Service: Urology;  Laterality: Left;    History reviewed. No pertinent family history.  Current Outpatient Medications on File Prior to Visit  Medication Sig Dispense Refill  . albuterol (PROVENTIL HFA;VENTOLIN HFA) 108 (90 Base) MCG/ACT inhaler Inhale 2 puffs into the lungs every 6 (six) hours as needed for wheezing or shortness of breath.     . aspirin 81 MG chewable tablet Chew 81 mg by mouth every morning.     . atorvastatin (LIPITOR) 40 MG tablet Take 40 mg by mouth at bedtime.     . carvedilol (COREG) 3.125 MG tablet Take 3.125 mg by mouth 2 (two) times daily with a meal.    . clonazePAM (KLONOPIN) 0.5 MG tablet Take 1   tablet (0.5 mg total) by mouth 2 (two) times daily. 60 tablet 0  . divalproex (DEPAKOTE) 250 MG DR tablet Take 250 mg by mouth 3 (three) times daily. Do not crush    . docusate sodium (COLACE) 100 MG capsule Take 100 mg by mouth daily as needed for mild constipation.     . escitalopram (LEXAPRO) 10 MG tablet Take 10 mg by mouth every morning.     . gabapentin (NEURONTIN) 300 MG capsule Take 300 mg by mouth 4 (four) times daily.    . hydrOXYzine (ATARAX/VISTARIL) 25 MG tablet Take 25 mg by mouth every 8 (eight) hours as needed for itching.    . ipratropium-albuterol (DUONEB) 0.5-2.5 (3) MG/3ML SOLN Take 3 mLs by nebulization every 6 (six) hours as needed (Wheezing/Shortness of Breath).     .  levothyroxine (SYNTHROID, LEVOTHROID) 125 MCG tablet Take 125 mcg by mouth daily before breakfast.    . loratadine (CLARITIN) 10 MG tablet Take 10 mg by mouth at bedtime.     . Multiple Vitamin (MULTIVITAMIN) tablet Take 1 tablet by mouth every morning.     . primidone (MYSOLINE) 50 MG tablet Take 50 mg by mouth at bedtime.     . senna-docusate (SENOKOT-S) 8.6-50 MG tablet Take 1 tablet by mouth at bedtime.    . zinc oxide 20 % ointment Apply 1 application topically 3 (three) times daily as needed for irritation.    . bisacodyl (DULCOLAX) 10 MG suppository Place 1 suppository (10 mg total) rectally daily as needed for moderate constipation. (Patient not taking: Reported on 11/22/2018) 12 suppository 0  . HYDROcodone-acetaminophen (NORCO/VICODIN) 5-325 MG tablet Take 1 tablet by mouth every 4 (four) hours as needed for moderate pain.     No current facility-administered medications on file prior to visit.     Allergies  Allergen Reactions  . Codeine Nausea And Vomiting    In cough syrup cause nausea and vomiting  . Penicillins Hives, Rash and Other (See Comments)    Not effective per patient. Pt does not know. She states it was when she was a child. Unknown    Social History   Substance and Sexual Activity  Alcohol Use Not Currently    Social History   Tobacco Use  Smoking Status Current Some Day Smoker  . Packs/day: 1.00  . Years: 45.00  . Pack years: 45.00  . Types: Cigarettes  Smokeless Tobacco Never Used  Tobacco Comment   cut down no to about 10 cigarettes per week    Review of Systems  Constitutional: Negative.   HENT: Negative.   Respiratory: Positive for wheezing.   Cardiovascular: Negative.   Gastrointestinal: Negative.   Genitourinary: Negative.   Musculoskeletal: Positive for joint pain.  Skin: Positive for rash.  Neurological: Positive for tremors.  Endo/Heme/Allergies: Negative.   Psychiatric/Behavioral: Negative.     Objective   Vitals:   11/22/18  1537  BP: 111/77  Pulse: 76  Resp: 16  Temp: (!) 97.5 F (36.4 C)  SpO2: (!) 89%    Physical Exam Vitals signs reviewed.  Constitutional:      Appearance: Normal appearance. She is obese. She is not ill-appearing.  HENT:     Head: Normocephalic and atraumatic.  Cardiovascular:     Rate and Rhythm: Normal rate and regular rhythm.     Heart sounds: Normal heart sounds. No murmur. No friction rub. No gallop.   Pulmonary:     Effort: Pulmonary effort is normal. No respiratory distress.       Breath sounds: Normal breath sounds. No stridor. No wheezing, rhonchi or rales.  Skin:    General: Skin is warm and dry.  Neurological:     Mental Status: She is alert and oriented to person, place, and time.   Breast: Large pendulous breasts noted bilaterally.  Well-healing biopsy site in the upper, inner quadrant of the left breast.  No dominant mass, nipple discharge, or dimpling are noted in either breast.  Axillas are negative for palpable nodes.  Mammogram of pathology reports reviewed  Assessment  Invasive ductal carcinoma of left breast, 5 mm, ER positive, PR positive, HER-2 negative Plan   Patient is scheduled for left modified radical mastectomy on 01/23/2019.  The risks and benefits of the procedure including bleeding, infection, cardiopulmonary difficulties, and the possibility of left arm swelling were fully explained to the patient, who gave informed consent.  Her thrombocytopenia seems to have resolved.  Her history of cirrhosis seems to be mild in nature and does not warrant any further therapy as per gastroenterology.

## 2019-01-16 ENCOUNTER — Ambulatory Visit (HOSPITAL_COMMUNITY): Payer: Medicare Other | Admitting: Hematology

## 2019-01-17 ENCOUNTER — Encounter (HOSPITAL_COMMUNITY): Payer: Self-pay

## 2019-01-17 NOTE — Patient Instructions (Signed)
Your procedure is scheduled on: 01/23/2019  Report to Laurie Moreno at  6:15   AM.  Call this number if you have problems the morning of surgery: [phone]   Remember:   Do not Eat or Drink after midnight   :  Take these medicines the morning of surgery with A SIP OF WATER: Sinemet IR, Carvedilol, Klonopin, Depakote, lexapro, Nexium,               and Levothyroxine.  Gabapentin if needed   Do not wear jewelry, make-up or nail polish.  Do not wear lotions, powders, or perfumes. You may wear deodorant.  Do not shave 48 hours prior to surgery. Men may shave face and neck.  Do not bring valuables to the hospital.  Contacts, dentures or bridgework may not be worn into surgery.  Leave suitcase in the car. After surgery it may be brought to your room.  For patients admitted to the hospital, checkout time is 11:00 AM the day of discharge.   Patients discharged the day of surgery will not be allowed to drive home.    Special Instructions: Shower using CHG night before surgery and shower the day of surgery use CHG.  Use special wash - you have one bottle of CHG for all showers.  You should use approximately 1/2 of the bottle for each shower.  Total or Modified Radical Mastectomy A total mastectomy and a modified radical mastectomy are surgeries that are done as part of treatment for breast cancer. You will have one of those types of surgery. Both types involve removing a breast.  In a total mastectomy (simple mastectomy), all breast tissue including the nipple will be removed.  In a modified radical mastectomy, lymph nodes under the arm will be removed along with the breast and nipple. Some of the lining over the muscle tissues under the breast may also be removed. These procedures may also be used to help prevent breast cancer. A preventive (prophylactic) mastectomy may be done if you are at an increased risk of breast cancer due to harmful changes (mutations) in certain genes (BRCA genes). In  that case, the procedure involves removing both of your breasts. This can reduce your risk of developing breast cancer in the future. For a transgender person, a total mastectomy may be done as part of a surgical transition from female to female. Let your health care provider know about:  Any allergies you have.  All medicines you are taking, including vitamins, herbs, eye drops, creams, and over-the-counter medicines.  Any problems you or family members have had with anesthetic medicines.  Any blood disorders you have.  Any surgeries you have had.  Any medical conditions you have.  Whether you are pregnant or may be pregnant. What are the risks? Generally, this is a safe procedure. However, problems may occur, including:  Pain.  Infection.  Bleeding.  Allergic reactions to medicines.  Scar tissue.  Chest numbness on the side of the surgery.  Fluid buildup under the skin flaps where your breast was removed (seroma).  Sensation of throbbing or tingling.  Stress or sadness from losing your breast. If you have the lymph nodes under your arm removed, you may have arm swelling, weakness, or numbness on the same side of your body as your surgery. What happens before the procedure? Staying hydrated Follow instructions from your health care provider about hydration, which may include:  Up to 2 hours before the procedure - you may continue to drink clear  liquids, such as water, clear fruit juice, black coffee, and plain tea. Eating and drinking restrictions Follow instructions from your health care provider about eating and drinking, which may include:  8 hours before the procedure - stop eating heavy meals or foods such as meat, fried foods, or fatty foods.  6 hours before the procedure - stop eating light meals or foods, such as toast or cereal.  6 hours before the procedure - stop drinking milk or drinks that contain milk.  2 hours before the procedure - stop drinking  clear liquids. Medicines  Ask your health care provider about: ? Changing or stopping your regular medicines. This is especially important if you are taking diabetes medicines or blood thinners. ? Taking medicines such as aspirin and ibuprofen. These medicines can thin your blood. Do not take these medicines unless your health care provider tells you to take them. ? Taking over-the-counter medicines, vitamins, herbs, and supplements.  Your health care team may give you antibiotic medicine to help prevent infection. General instructions  You may be checked for extra fluid around your lymph nodes (lymphedema).  Plan to have someone take you home from the hospital or clinic.  Plan to have a responsible adult care for you for at least 24 hours after you leave the hospital or clinic. This is important.  Ask your health care provider how your surgical site will be marked or identified.  You may be asked to shower with a germ-killing soap. What happens during the procedure?   To lower your risk of infection: ? Your health care team will wash or sanitize their hands. ? Your skin will be washed with soap.  An IV will be inserted into one of your veins.  You will be given a medicine to make you fall asleep (general anesthetic).  A wide incision will be made around your nipple. The skin and nipple inside the incision will be removed along with all breast tissue.  If you are having a modified radical mastectomy: ? The lining over your chest muscles will be removed. ? The incision may be extended to reach the lymph nodes under your arm, or a second incision may be made. ? Lymph nodes will be removed.  Breast tissue and lymph nodes that are removed will be sent to the lab for testing.  You may have a drainage tube inserted into your incision to collect fluid that builds up after surgery. This tube will be connected to a suction bulb on the outside of your body to remove the fluid.  Your  incision or incisions will be closed with stitches (sutures).  A bandage (dressing) will be placed over your breast area. If lymph nodes were removed, a dressing will also be placed under your arm. The procedure may vary among health care providers and hospitals. What happens after the procedure?  Your blood pressure, heart rate, breathing rate, and blood oxygen level will be monitored until the medicines you were given have worn off.  You will be given pain medicine as needed.  You will be encouraged to get up and walk as soon as you can.  Your IV can be removed when you are able to eat and drink.  You may have a drainage tube in place for 2-3 days to prevent a collection of blood (hematoma) from developing in the breast area. You will be given instructions about caring for the drain before you go home.  A pressure bandage may be applied for 1-2 days  to prevent bleeding or swelling. Ask your health care provider how to care for your pressure bandage at home. Summary  In a total mastectomy (simple mastectomy), all breast tissue including the nipple will be removed. In a modified radical mastectomy, the lymph nodes under the arm will be removed along with the breast and nipple.  Before the procedure, follow instructions from your health care provider about eating and drinking, and ask about changing or stopping your regular medicines.  You will be given a medicine to make you fall asleep (general anesthetic) during the procedure. This information is not intended to replace advice given to you by your health care provider. Make sure you discuss any questions you have with your health care provider. Document Released: 01/13/2001 Document Revised: 06/24/2018 Document Reviewed: 01/22/2017  Total or Modified Radical Mastectomy, Care After This sheet gives you information about how to care for yourself after your procedure. Your health care provider may also give you more specific instructions.  If you have problems or questions, contact your health care provider. What can I expect after the procedure? After the procedure, it is common to have:  Pain.  Numbness.  Stiffness in the arm or shoulder.  Feelings of stress, sadness, or depression. If the lymph nodes under your arm were removed, you may have arm swelling, weakness, or numbness on the same side of your body as your surgery. Follow these instructions at home: Incision care   Follow instructions from your health care provider about how to take care of your incision. Make sure you: ? Wash your hands with soap and water before you change your bandage (dressing). If soap and water are not available, use hand sanitizer. ? Change your dressing as told by your health care provider. ? Leave stitches (sutures), skin glue, or adhesive strips in place. These skin closures may need to stay in place for 2 weeks or longer. If adhesive strip edges start to loosen and curl up, you may trim the loose edges. Do not remove adhesive strips completely unless your health care provider tells you to do that.  Check your incision area every day for signs of infection. Check for: ? Redness, swelling, or more pain. ? Fluid or blood. ? Warmth. ? Pus or a bad smell.  If you were sent home with a surgical drain in place, follow instructions from your health care provider about emptying it. Bathing  Do not take baths, swim, or use a hot tub until your health care provider approves. Ask your health care provider if you may take showers. You may only be allowed to take sponge baths. Activity  Return to your normal activities as told by your health care provider. Ask your health care provider what activities are safe for you.  Avoid activities that take a lot of effort.  Be careful to avoid any activities that could cause an injury to your arm on the side of your surgery.  Do not lift anything that is heavier than 10 lb (4.5 kg), or the limit  that you are told, until your health care provider says that it is safe.  Avoid lifting with the arm on the side of your surgery.  Do not carry heavy objects on your shoulder.  After your drain is removed, do exercises to prevent stiffness and swelling in your arm. Talk with your health care provider about which exercises are safe for you. General instructions  Take over-the-counter and prescription medicines only as told by your health care  provider.  You may eat what you usually do.  Keep your arm raised (elevated) above the level of your heart when you are sitting or lying down.  Do not wear tight jewelry on your arm, wrist, or fingers on the side of your surgery.  You may be given a tight sleeve (compression bandage) to wear over your arm on the side of your surgery. Wear this sleeve as told by your health care provider.  Ask your health care provider when you can start wearing a bra or using a breast prosthesis.  Before you are involved in certain procedures such as giving blood or having your blood pressure checked, tell all your health care providers if lymph nodes under your arm were removed. This is important information. Follow-up  Keep all follow-up visits as told by your health care provider. This is important.  Get checked for extra fluid around your lymph nodes (lymphedema) as often as told by your health care provider. Contact a health care provider if:  You have a fever.  Your pain medicine is not working.  Your arm swelling, weakness, or numbness has not improved after a few weeks.  You have new swelling in your breast area or arm.  You have redness, swelling, or more pain in your incision area.  You have fluid or blood coming from your incision.  Your incision feels warm to the touch.  You have pus or a bad smell coming from your incision. Get help right away if:  You have very bad pain in your breast area or arm.  You have chest pain.  You have  difficulty breathing. Summary  Follow instructions from your health care provider about how to take care of your incision. Check your incision area every day for signs of infection.  Ask your health care provider what activities are safe for you.  Keep all follow-up visits as told by your health care provider. This is important.  Make sure you know which symptoms should cause you to contact your health care provider or to get help right away. This information is not intended to replace advice given to you by your health care provider. Make sure you discuss any questions you have with your health care provider. Document Released: 12/12/2003 Document Revised: 06/24/2018 Document Reviewed: 01/22/2017 Elsevier Patient Education  2020 [place] Patient Education  2020 [place] Anesthesia, Adult, Care After This sheet gives you information about how to care for yourself after your procedure. Your health care provider may also give you more specific instructions. If you have problems or questions, contact your health care provider. What can I expect after the procedure? After the procedure, the following side effects are common:  Pain or discomfort at the IV site.  Nausea.  Vomiting.  Sore throat.  Trouble concentrating.  Feeling cold or chills.  Weak or tired.  Sleepiness and fatigue.  Soreness and body aches. These side effects can affect parts of the body that were not involved in surgery. Follow these instructions at home:  For at least 24 hours after the procedure:  Have a responsible adult stay with you. It is important to have someone help care for you until you are awake and alert.  Rest as needed.  Do not: ? Participate in activities in which you could fall or become injured. ? Drive. ? Use heavy machinery. ? Drink alcohol. ? Take sleeping pills or medicines that cause drowsiness. ? Make important decisions or sign legal  documents.  ? Take care of children on your own. Eating and drinking  Follow any instructions from your health care provider about eating or drinking restrictions.  When you feel hungry, start by eating small amounts of foods that are soft and easy to digest (bland), such as toast. Gradually return to your regular diet.  Drink enough fluid to keep your urine pale yellow.  If you vomit, rehydrate by drinking water, juice, or clear broth. General instructions  If you have sleep apnea, surgery and certain medicines can increase your risk for breathing problems. Follow instructions from your health care provider about wearing your sleep device: ? Anytime you are sleeping, including during daytime naps. ? While taking prescription pain medicines, sleeping medicines, or medicines that make you drowsy.  Return to your normal activities as told by your health care provider. Ask your health care provider what activities are safe for you.  Take over-the-counter and prescription medicines only as told by your health care provider.  If you smoke, do not smoke without supervision.  Keep all follow-up visits as told by your health care provider. This is important. Contact a health care provider if:  You have nausea or vomiting that does not get better with medicine.  You cannot eat or drink without vomiting.  You have pain that does not get better with medicine.  You are unable to pass urine.  You develop a skin rash.  You have a fever.  You have redness around your IV site that gets worse. Get help right away if:  You have difficulty breathing.  You have chest pain.  You have blood in your urine or stool, or you vomit blood. Summary  After the procedure, it is common to have a sore throat or nausea. It is also common to feel tired.  Have a responsible adult stay with you for the first 24 hours after general anesthesia. It is important to have someone help care for you until you are awake and  alert.  When you feel hungry, start by eating small amounts of foods that are soft and easy to digest (bland), such as toast. Gradually return to your regular diet.  Drink enough fluid to keep your urine pale yellow.  Return to your normal activities as told by your health care provider. Ask your health care provider what activities are safe for you. This information is not intended to replace advice given to you by your health care provider. Make sure you discuss any questions you have with your health care provider. Document Released: 07/27/2000 Document Revised: 04/23/2017 Document Reviewed: 12/04/2016 Elsevier Patient Education  2020 [employer].

## 2019-01-19 ENCOUNTER — Other Ambulatory Visit: Payer: Self-pay

## 2019-01-19 ENCOUNTER — Encounter (HOSPITAL_COMMUNITY)
Admission: RE | Admit: 2019-01-19 | Discharge: 2019-01-19 | Disposition: A | Discharge status: Home / Self Care | Payer: Medicare Other | Source: Ambulatory Visit | Attending: General Surgery | Admitting: General Surgery

## 2019-01-19 ENCOUNTER — Other Ambulatory Visit (HOSPITAL_COMMUNITY)
Admission: RE | Admit: 2019-01-19 | Discharge: 2019-01-19 | Disposition: A | Discharge status: Home / Self Care | Payer: Medicare Other | Source: Ambulatory Visit | Attending: General Surgery | Admitting: General Surgery

## 2019-01-19 ENCOUNTER — Encounter (HOSPITAL_COMMUNITY): Payer: Self-pay

## 2019-01-19 DIAGNOSIS — Z20828 Contact with and (suspected) exposure to other viral communicable diseases: Secondary | ICD-10-CM | POA: Insufficient documentation

## 2019-01-19 DIAGNOSIS — Z01818 Encounter for other preprocedural examination: Secondary | ICD-10-CM | POA: Insufficient documentation

## 2019-01-19 DIAGNOSIS — C50212 Malignant neoplasm of upper-inner quadrant of left female breast: Secondary | ICD-10-CM | POA: Diagnosis not present

## 2019-01-19 LAB — CBC WITH DIFFERENTIAL/PLATELET
Abs Immature Granulocytes: 0.01 10*3/uL (ref 0.00–0.07)
Basophils Absolute: 0.1 10*3/uL (ref 0.0–0.1)
Basophils Relative: 1 %
Eosinophils Absolute: 0.1 10*3/uL (ref 0.0–0.5)
Eosinophils Relative: 1 %
HCT: 43.7 % (ref 36.0–46.0)
Hemoglobin: 14.4 g/dL (ref 12.0–15.0)
Immature Granulocytes: 0 %
Lymphocytes Relative: 60 %
Lymphs Abs: 3.4 10*3/uL (ref 0.7–4.0)
MCH: 31 pg (ref 26.0–34.0)
MCHC: 33 g/dL (ref 30.0–36.0)
MCV: 94.2 fL (ref 80.0–100.0)
Monocytes Absolute: 0.7 10*3/uL (ref 0.1–1.0)
Monocytes Relative: 11 %
Neutro Abs: 1.6 10*3/uL — ABNORMAL LOW (ref 1.7–7.7)
Neutrophils Relative %: 27 %
Platelets: 127 10*3/uL — ABNORMAL LOW (ref 150–400)
RBC: 4.64 MIL/uL (ref 3.87–5.11)
RDW: 12.4 % (ref 11.5–15.5)
WBC: 5.8 10*3/uL (ref 4.0–10.5)
nRBC: 0 % (ref 0.0–0.2)

## 2019-01-19 LAB — TYPE AND SCREEN
ABO/RH(D): O POS
Antibody Screen: NEGATIVE

## 2019-01-19 LAB — SARS CORONAVIRUS 2 (TAT 6-24 HRS): SARS Coronavirus 2: NEGATIVE

## 2019-01-19 LAB — BASIC METABOLIC PANEL
Anion gap: 9 (ref 5–15)
BUN: 19 mg/dL (ref 8–23)
CO2: 24 mmol/L (ref 22–32)
Calcium: 8.9 mg/dL (ref 8.9–10.3)
Chloride: 106 mmol/L (ref 98–111)
Creatinine, Ser: 0.76 mg/dL (ref 0.44–1.00)
GFR calc Af Amer: >60 mL/min (ref >60)
GFR calc non Af Amer: >60 mL/min (ref >60)
Glucose, Bld: 79 mg/dL (ref 70–99)
Potassium: 4.6 mmol/L (ref 3.5–5.1)
Sodium: 139 mmol/L (ref 135–145)

## 2019-01-23 ENCOUNTER — Inpatient Hospital Stay (HOSPITAL_COMMUNITY): Payer: Medicare Other | Admitting: Anesthesiology

## 2019-01-23 ENCOUNTER — Other Ambulatory Visit: Payer: Self-pay

## 2019-01-23 ENCOUNTER — Inpatient Hospital Stay (HOSPITAL_COMMUNITY)
Admission: RE | Admit: 2019-01-23 | Discharge: 2019-01-25 | DRG: 581 | Disposition: A | Discharge status: Home / Self Care | Payer: Medicare Other | Attending: General Surgery | Admitting: General Surgery

## 2019-01-23 ENCOUNTER — Encounter (HOSPITAL_COMMUNITY): Payer: Self-pay

## 2019-01-23 ENCOUNTER — Encounter (HOSPITAL_COMMUNITY)
Admission: RE | Disposition: A | Discharge status: Home / Self Care | Payer: Self-pay | Source: Home / Self Care | Attending: General Surgery

## 2019-01-23 DIAGNOSIS — F319 Bipolar disorder, unspecified: Secondary | ICD-10-CM | POA: Diagnosis present

## 2019-01-23 DIAGNOSIS — E785 Hyperlipidemia, unspecified: Secondary | ICD-10-CM | POA: Diagnosis present

## 2019-01-23 DIAGNOSIS — F17210 Nicotine dependence, cigarettes, uncomplicated: Secondary | ICD-10-CM | POA: Diagnosis present

## 2019-01-23 DIAGNOSIS — Z20828 Contact with and (suspected) exposure to other viral communicable diseases: Secondary | ICD-10-CM | POA: Diagnosis present

## 2019-01-23 DIAGNOSIS — C50912 Malignant neoplasm of unspecified site of left female breast: Secondary | ICD-10-CM | POA: Diagnosis present

## 2019-01-23 DIAGNOSIS — Z9012 Acquired absence of left breast and nipple: Secondary | ICD-10-CM

## 2019-01-23 DIAGNOSIS — Z79890 Hormone replacement therapy (postmenopausal): Secondary | ICD-10-CM

## 2019-01-23 DIAGNOSIS — Z79899 Other long term (current) drug therapy: Secondary | ICD-10-CM

## 2019-01-23 DIAGNOSIS — J449 Chronic obstructive pulmonary disease, unspecified: Secondary | ICD-10-CM | POA: Diagnosis present

## 2019-01-23 DIAGNOSIS — D696 Thrombocytopenia, unspecified: Secondary | ICD-10-CM | POA: Diagnosis present

## 2019-01-23 DIAGNOSIS — Z170 Estrogen receptor positive status [ER+]: Secondary | ICD-10-CM | POA: Diagnosis not present

## 2019-01-23 DIAGNOSIS — E039 Hypothyroidism, unspecified: Secondary | ICD-10-CM | POA: Diagnosis present

## 2019-01-23 DIAGNOSIS — L309 Dermatitis, unspecified: Secondary | ICD-10-CM | POA: Diagnosis present

## 2019-01-23 DIAGNOSIS — Z7982 Long term (current) use of aspirin: Secondary | ICD-10-CM

## 2019-01-23 DIAGNOSIS — I509 Heart failure, unspecified: Secondary | ICD-10-CM | POA: Diagnosis present

## 2019-01-23 DIAGNOSIS — C50212 Malignant neoplasm of upper-inner quadrant of left female breast: Secondary | ICD-10-CM | POA: Diagnosis present

## 2019-01-23 HISTORY — PX: MASTECTOMY MODIFIED RADICAL: SHX5962

## 2019-01-23 SURGERY — MASTECTOMY, MODIFIED RADICAL
Anesthesia: General | Site: Breast | Laterality: Left

## 2019-01-23 MED ORDER — ALBUTEROL SULFATE (2.5 MG/3ML) 0.083% IN NEBU: 2.5000 mg | INHALATION_SOLUTION | RESPIRATORY_TRACT | Status: DC | PRN

## 2019-01-23 MED ORDER — DIPHENHYDRAMINE HCL 12.5 MG/5ML PO ELIX: 12.5000 mg | ORAL_SOLUTION | Freq: Four times a day (QID) | ORAL | Status: DC | PRN

## 2019-01-23 MED ORDER — GLYCOPYRROLATE PF 0.2 MG/ML IJ SOSY
PREFILLED_SYRINGE | INTRAMUSCULAR | Status: DC | PRN
  Administered 2019-01-23: .2 mg via INTRAVENOUS

## 2019-01-23 MED ORDER — CHLORHEXIDINE GLUCONATE CLOTH 2 % EX PADS: 6.0000 | MEDICATED_PAD | Freq: Once | CUTANEOUS | Status: DC

## 2019-01-23 MED ORDER — CARVEDILOL 3.125 MG PO TABS
3.1250 mg | ORAL_TABLET | Freq: Two times a day (BID) | ORAL | Status: DC
  Administered 2019-01-23 – 2019-01-25 (×4): 3.125 mg via ORAL
  Filled 2019-01-23 (×4): qty 1

## 2019-01-23 MED ORDER — SODIUM CHLORIDE 0.9 % IV SOLN
INTRAVENOUS | Status: DC
  Administered 2019-01-23 – 2019-01-24 (×2): via INTRAVENOUS

## 2019-01-23 MED ORDER — HYDROXYZINE HCL 25 MG PO TABS
25.0000 mg | ORAL_TABLET | Freq: Two times a day (BID) | ORAL | Status: DC
  Administered 2019-01-23 – 2019-01-25 (×5): 25 mg via ORAL
  Filled 2019-01-23 (×5): qty 1

## 2019-01-23 MED ORDER — BUPIVACAINE LIPOSOME 1.3 % IJ SUSP
INTRAMUSCULAR | Status: AC
  Filled 2019-01-23: qty 20

## 2019-01-23 MED ORDER — GLYCOPYRROLATE PF 0.2 MG/ML IJ SOSY
PREFILLED_SYRINGE | INTRAMUSCULAR | Status: AC
  Filled 2019-01-23: qty 1

## 2019-01-23 MED ORDER — KETOROLAC TROMETHAMINE 15 MG/ML IJ SOLN
15.0000 mg | Freq: Four times a day (QID) | INTRAMUSCULAR | Status: AC
  Administered 2019-01-23: 11:00:00 15 mg via INTRAVENOUS
  Filled 2019-01-23: qty 1

## 2019-01-23 MED ORDER — ATORVASTATIN CALCIUM 40 MG PO TABS
40.0000 mg | ORAL_TABLET | Freq: Every day | ORAL | Status: DC
  Administered 2019-01-23 – 2019-01-24 (×2): 40 mg via ORAL
  Filled 2019-01-23 (×2): qty 1

## 2019-01-23 MED ORDER — BUPIVACAINE LIPOSOME 1.3 % IJ SUSP
INTRAMUSCULAR | Status: DC | PRN
  Administered 2019-01-23: 20 mL

## 2019-01-23 MED ORDER — ADULT MULTIVITAMIN W/MINERALS CH
1.0000 | ORAL_TABLET | Freq: Every morning | ORAL | Status: DC
  Administered 2019-01-23 – 2019-01-25 (×3): 1 via ORAL
  Filled 2019-01-23 (×6): qty 1

## 2019-01-23 MED ORDER — FENTANYL CITRATE (PF) 100 MCG/2ML IJ SOLN
25.0000 ug | INTRAMUSCULAR | Status: DC | PRN
  Administered 2019-01-23: 25 ug via INTRAVENOUS
  Filled 2019-01-23: qty 2

## 2019-01-23 MED ORDER — SIMETHICONE 80 MG PO CHEW: 40.0000 mg | CHEWABLE_TABLET | Freq: Four times a day (QID) | ORAL | Status: DC | PRN

## 2019-01-23 MED ORDER — EPHEDRINE 5 MG/ML INJ
INTRAVENOUS | Status: AC
  Filled 2019-01-23: qty 10

## 2019-01-23 MED ORDER — LEVOTHYROXINE SODIUM 125 MCG PO TABS
125.0000 ug | ORAL_TABLET | Freq: Every day | ORAL | Status: DC
  Administered 2019-01-24 – 2019-01-25 (×2): 125 ug via ORAL
  Filled 2019-01-23 (×2): qty 1

## 2019-01-23 MED ORDER — CLONAZEPAM 0.5 MG PO TABS
0.5000 mg | ORAL_TABLET | Freq: Every day | ORAL | Status: DC
  Administered 2019-01-24 – 2019-01-25 (×2): 0.5 mg via ORAL
  Filled 2019-01-23 (×2): qty 1

## 2019-01-23 MED ORDER — LACTATED RINGERS IV SOLN: INTRAVENOUS | Status: DC

## 2019-01-23 MED ORDER — SUCCINYLCHOLINE CHLORIDE 20 MG/ML IJ SOLN
INTRAMUSCULAR | Status: DC | PRN
  Administered 2019-01-23: 160 mg via INTRAVENOUS

## 2019-01-23 MED ORDER — ESCITALOPRAM OXALATE 10 MG PO TABS
20.0000 mg | ORAL_TABLET | Freq: Every day | ORAL | Status: DC
  Administered 2019-01-24 – 2019-01-25 (×2): 20 mg via ORAL
  Filled 2019-01-23: qty 1
  Filled 2019-01-23 (×2): qty 2
  Filled 2019-01-23: qty 1

## 2019-01-23 MED ORDER — PROMETHAZINE HCL 25 MG/ML IJ SOLN: 6.2500 mg | INTRAMUSCULAR | Status: DC | PRN

## 2019-01-23 MED ORDER — ONDANSETRON 4 MG PO TBDP: 4.0000 mg | ORAL_TABLET | Freq: Four times a day (QID) | ORAL | Status: DC | PRN

## 2019-01-23 MED ORDER — GABAPENTIN 300 MG PO CAPS
300.0000 mg | ORAL_CAPSULE | Freq: Four times a day (QID) | ORAL | Status: DC
  Administered 2019-01-23 – 2019-01-25 (×8): 300 mg via ORAL
  Filled 2019-01-23 (×8): qty 1

## 2019-01-23 MED ORDER — LACTATED RINGERS IV SOLN
INTRAVENOUS | Status: DC | PRN
  Administered 2019-01-23: 07:00:00 via INTRAVENOUS

## 2019-01-23 MED ORDER — DIVALPROEX SODIUM 250 MG PO DR TAB
250.0000 mg | DELAYED_RELEASE_TABLET | Freq: Three times a day (TID) | ORAL | Status: DC
  Administered 2019-01-23 – 2019-01-25 (×6): 250 mg via ORAL
  Filled 2019-01-23 (×6): qty 1

## 2019-01-23 MED ORDER — 0.9 % SODIUM CHLORIDE (POUR BTL) OPTIME
TOPICAL | Status: DC | PRN
  Administered 2019-01-23 (×2): 1000 mL

## 2019-01-23 MED ORDER — ACETAMINOPHEN 650 MG RE SUPP: 650.0000 mg | Freq: Four times a day (QID) | RECTAL | Status: DC | PRN

## 2019-01-23 MED ORDER — MIDAZOLAM HCL 2 MG/2ML IJ SOLN: 0.5000 mg | Freq: Once | INTRAMUSCULAR | Status: DC | PRN

## 2019-01-23 MED ORDER — ENOXAPARIN SODIUM 40 MG/0.4ML ~~LOC~~ SOLN
40.0000 mg | Freq: Once | SUBCUTANEOUS | Status: AC
  Administered 2019-01-23: 07:00:00 40 mg via SUBCUTANEOUS
  Filled 2019-01-23: qty 0.4

## 2019-01-23 MED ORDER — POVIDONE-IODINE 10 % OINT PACKET
TOPICAL_OINTMENT | CUTANEOUS | Status: DC | PRN
  Administered 2019-01-23: 1 via TOPICAL

## 2019-01-23 MED ORDER — ONDANSETRON HCL 4 MG/2ML IJ SOLN
4.0000 mg | Freq: Four times a day (QID) | INTRAMUSCULAR | Status: DC | PRN
  Administered 2019-01-24: 4 mg via INTRAVENOUS
  Filled 2019-01-23 (×2): qty 2

## 2019-01-23 MED ORDER — SUCCINYLCHOLINE CHLORIDE 200 MG/10ML IV SOSY
PREFILLED_SYRINGE | INTRAVENOUS | Status: AC
  Filled 2019-01-23: qty 10

## 2019-01-23 MED ORDER — CARBOXYMETHYLCELLULOSE SODIUM 0.5 % OP SOLN: 1.0000 [drp] | Freq: Three times a day (TID) | OPHTHALMIC | Status: DC | PRN

## 2019-01-23 MED ORDER — POVIDONE-IODINE 10 % EX OINT
TOPICAL_OINTMENT | CUTANEOUS | Status: AC
  Filled 2019-01-23: qty 1

## 2019-01-23 MED ORDER — PRIMIDONE 50 MG PO TABS
100.0000 mg | ORAL_TABLET | Freq: Every day | ORAL | Status: DC
  Administered 2019-01-23 – 2019-01-24 (×2): 100 mg via ORAL
  Filled 2019-01-23 (×2): qty 2

## 2019-01-23 MED ORDER — LORATADINE 10 MG PO TABS
10.0000 mg | ORAL_TABLET | Freq: Every day | ORAL | Status: DC
  Administered 2019-01-23 – 2019-01-25 (×3): 10 mg via ORAL
  Filled 2019-01-23 (×3): qty 1

## 2019-01-23 MED ORDER — TRAMADOL HCL 50 MG PO TABS
50.0000 mg | ORAL_TABLET | Freq: Four times a day (QID) | ORAL | Status: DC | PRN
  Administered 2019-01-24 – 2019-01-25 (×3): 50 mg via ORAL
  Filled 2019-01-23 (×3): qty 1

## 2019-01-23 MED ORDER — PROPOFOL 10 MG/ML IV BOLUS
INTRAVENOUS | Status: DC | PRN
  Administered 2019-01-23: 160 mg via INTRAVENOUS

## 2019-01-23 MED ORDER — LIDOCAINE HCL (CARDIAC) PF 100 MG/5ML IV SOSY
PREFILLED_SYRINGE | INTRAVENOUS | Status: DC | PRN
  Administered 2019-01-23: 40 mg via INTRAVENOUS

## 2019-01-23 MED ORDER — POLYVINYL ALCOHOL 1.4 % OP SOLN: 1.0000 [drp] | Freq: Three times a day (TID) | OPHTHALMIC | Status: DC | PRN

## 2019-01-23 MED ORDER — VITAMIN D 25 MCG (1000 UNIT) PO TABS
2000.0000 [IU] | ORAL_TABLET | Freq: Every day | ORAL | Status: DC
  Administered 2019-01-23 – 2019-01-25 (×3): 2000 [IU] via ORAL
  Filled 2019-01-23 (×3): qty 2

## 2019-01-23 MED ORDER — DIPHENHYDRAMINE HCL 50 MG/ML IJ SOLN: 12.5000 mg | Freq: Four times a day (QID) | INTRAMUSCULAR | Status: DC | PRN

## 2019-01-23 MED ORDER — DEXAMETHASONE SODIUM PHOSPHATE 10 MG/ML IJ SOLN
INTRAMUSCULAR | Status: AC
  Filled 2019-01-23: qty 1

## 2019-01-23 MED ORDER — FENTANYL CITRATE (PF) 100 MCG/2ML IJ SOLN
INTRAMUSCULAR | Status: DC | PRN
  Administered 2019-01-23: 50 ug via INTRAVENOUS
  Administered 2019-01-23: 100 ug via INTRAVENOUS
  Administered 2019-01-23: 50 ug via INTRAVENOUS

## 2019-01-23 MED ORDER — CARBIDOPA-LEVODOPA 25-100 MG PO TABS
1.0000 | ORAL_TABLET | Freq: Three times a day (TID) | ORAL | Status: DC
  Administered 2019-01-23 – 2019-01-25 (×6): 1 via ORAL
  Filled 2019-01-23 (×6): qty 1

## 2019-01-23 MED ORDER — LIDOCAINE 2% (20 MG/ML) 5 ML SYRINGE
INTRAMUSCULAR | Status: AC
  Filled 2019-01-23: qty 5

## 2019-01-23 MED ORDER — FENTANYL CITRATE (PF) 250 MCG/5ML IJ SOLN
INTRAMUSCULAR | Status: AC
  Filled 2019-01-23: qty 5

## 2019-01-23 MED ORDER — VANCOMYCIN HCL IN DEXTROSE 1-5 GM/200ML-% IV SOLN
1000.0000 mg | INTRAVENOUS | Status: AC
  Administered 2019-01-23: 1000 mg via INTRAVENOUS
  Filled 2019-01-23: qty 200

## 2019-01-23 MED ORDER — ALBUTEROL SULFATE HFA 108 (90 BASE) MCG/ACT IN AERS: 2.0000 | INHALATION_SPRAY | Freq: Four times a day (QID) | RESPIRATORY_TRACT | Status: DC | PRN

## 2019-01-23 MED ORDER — ENOXAPARIN SODIUM 40 MG/0.4ML ~~LOC~~ SOLN
40.0000 mg | SUBCUTANEOUS | Status: DC
  Administered 2019-01-24 – 2019-01-25 (×2): 40 mg via SUBCUTANEOUS
  Filled 2019-01-23 (×2): qty 0.4

## 2019-01-23 MED ORDER — LORAZEPAM 2 MG/ML IJ SOLN
1.0000 mg | INTRAMUSCULAR | Status: DC | PRN
  Administered 2019-01-24: 01:00:00 1 mg via INTRAVENOUS
  Filled 2019-01-23: qty 1

## 2019-01-23 MED ORDER — PANTOPRAZOLE SODIUM 40 MG PO TBEC
40.0000 mg | DELAYED_RELEASE_TABLET | Freq: Every day | ORAL | Status: DC
  Administered 2019-01-24 – 2019-01-25 (×2): 40 mg via ORAL
  Filled 2019-01-23 (×2): qty 1

## 2019-01-23 MED ORDER — ONDANSETRON HCL 4 MG/2ML IJ SOLN
INTRAMUSCULAR | Status: AC
  Filled 2019-01-23: qty 2

## 2019-01-23 MED ORDER — PROPOFOL 10 MG/ML IV BOLUS
INTRAVENOUS | Status: AC
  Filled 2019-01-23: qty 40

## 2019-01-23 MED ORDER — ACETAMINOPHEN 325 MG PO TABS
650.0000 mg | ORAL_TABLET | Freq: Four times a day (QID) | ORAL | Status: DC | PRN
  Administered 2019-01-25: 13:00:00 650 mg via ORAL
  Filled 2019-01-23: qty 2

## 2019-01-23 MED ORDER — EPHEDRINE SULFATE 50 MG/ML IJ SOLN
INTRAMUSCULAR | Status: DC | PRN
  Administered 2019-01-23: 10 mg via INTRAVENOUS

## 2019-01-23 MED ORDER — KETOROLAC TROMETHAMINE 15 MG/ML IJ SOLN
15.0000 mg | Freq: Four times a day (QID) | INTRAMUSCULAR | Status: DC | PRN
  Administered 2019-01-23: 23:00:00 15 mg via INTRAVENOUS
  Filled 2019-01-23: qty 1

## 2019-01-23 MED ORDER — SODIUM CHLORIDE 0.9% IV SOLUTION: Freq: Once | INTRAVENOUS | Status: DC

## 2019-01-23 SURGICAL SUPPLY — 51 items
APPLIER CLIP 9.375 SM OPEN (CLIP) ×4
BINDER BREAST LRG (GAUZE/BANDAGES/DRESSINGS) IMPLANT
BINDER BREAST XLRG (GAUZE/BANDAGES/DRESSINGS) ×1 IMPLANT
CHLORAPREP W/TINT 26 (MISCELLANEOUS) ×2 IMPLANT
CLIP APPLIE 9.375 SM OPEN (CLIP) IMPLANT
CLOTH BEACON ORANGE TIMEOUT ST (SAFETY) ×2 IMPLANT
COVER LIGHT HANDLE STERIS (MISCELLANEOUS) ×4 IMPLANT
COVER WAND RF STERILE (DRAPES) ×1 IMPLANT
DRAPE HALF SHEET 40X57 (DRAPES) ×1 IMPLANT
DRSG PAD ABDOMINAL 8X10 ST (GAUZE/BANDAGES/DRESSINGS) ×1 IMPLANT
ELECT REM PT RETURN 9FT ADLT (ELECTROSURGICAL) ×2
ELECTRODE REM PT RTRN 9FT ADLT (ELECTROSURGICAL) ×1 IMPLANT
EVACUATOR DRAINAGE 10X20 100CC (DRAIN) ×1 IMPLANT
EVACUATOR SILICONE 100CC (DRAIN) ×1
GAUZE SPONGE 4X4 12PLY STRL (GAUZE/BANDAGES/DRESSINGS) ×2 IMPLANT
GLOVE BIO SURGEON STRL SZ 6.5 (GLOVE) ×1 IMPLANT
GLOVE BIO SURGEON STRL SZ7 (GLOVE) ×1 IMPLANT
GLOVE BIOGEL PI IND STRL 6.5 (GLOVE) IMPLANT
GLOVE BIOGEL PI IND STRL 7.0 (GLOVE) ×3 IMPLANT
GLOVE BIOGEL PI INDICATOR 6.5 (GLOVE) ×1
GLOVE BIOGEL PI INDICATOR 7.0 (GLOVE) ×3
GLOVE ECLIPSE 6.5 STRL STRAW (GLOVE) ×1 IMPLANT
GLOVE SURG SS PI 7.5 STRL IVOR (GLOVE) ×2 IMPLANT
GOWN STRL REUS W/TWL LRG LVL3 (GOWN DISPOSABLE) ×6 IMPLANT
INST SET MINOR GENERAL (KITS) ×2 IMPLANT
KIT TURNOVER KIT A (KITS) ×2 IMPLANT
MANIFOLD NEPTUNE II (INSTRUMENTS) ×2 IMPLANT
NDL HYPO 21X1.5 SAFETY (NEEDLE) ×1 IMPLANT
NEEDLE HYPO 21X1.5 SAFETY (NEEDLE) ×2 IMPLANT
NEEDLE HYPO 22GX1.5 SAFETY (NEEDLE) ×1 IMPLANT
NS IRRIG 1000ML POUR BTL (IV SOLUTION) ×3 IMPLANT
PACK MINOR (CUSTOM PROCEDURE TRAY) ×2 IMPLANT
PAD ABD 5X9 TENDERSORB (GAUZE/BANDAGES/DRESSINGS) ×4 IMPLANT
PAD ARMBOARD 7.5X6 YLW CONV (MISCELLANEOUS) ×2 IMPLANT
SET BASIN LINEN APH (SET/KITS/TRAYS/PACK) ×2 IMPLANT
SPONGE DRAIN TRACH 4X4 STRL 2S (GAUZE/BANDAGES/DRESSINGS) ×2 IMPLANT
SPONGE INTESTINAL PEANUT (DISPOSABLE) ×2 IMPLANT
SPONGE LAP 18X18 RF (DISPOSABLE) ×5 IMPLANT
STAPLER VISISTAT (STAPLE) ×2 IMPLANT
SUT ETHILON 3 0 FSL (SUTURE) ×2 IMPLANT
SUT SILK 2 0 (SUTURE) ×1
SUT SILK 2 0 SH (SUTURE) ×2 IMPLANT
SUT SILK 2-0 18XBRD TIE 12 (SUTURE) ×1 IMPLANT
SUT VIC AB 2-0 CT1 27 (SUTURE) ×5
SUT VIC AB 2-0 CT1 TAPERPNT 27 (SUTURE) ×4 IMPLANT
SUT VIC AB 3-0 SH 27 (SUTURE) ×1
SUT VIC AB 3-0 SH 27X BRD (SUTURE) IMPLANT
SUT VICRYL AB 2 0 TIES (SUTURE) IMPLANT
SYR 20ML LL LF (SYRINGE) ×1 IMPLANT
SYR 30ML LL (SYRINGE) ×1 IMPLANT
SYR BULB IRRIGATION 50ML (SYRINGE) ×2 IMPLANT

## 2019-01-23 NOTE — Interval H&P Note (Signed)
History and Physical Interval Note:  01/23/2019 7:11 AM  Laurie Moreno  has presented today for surgery, with the diagnosis of left breast cancer.  The various methods of treatment have been discussed with the patient and family. After consideration of risks, benefits and other options for treatment, the patient has consented to  Procedure(s): LEFT MODIFIED RADICAL MASTECTOMY (Left) as a surgical intervention.  The patient's history has been reviewed, patient examined, no change in status, stable for surgery.  I have reviewed the patient's chart and labs.  Questions were answered to the patient's satisfaction.     Aviva Signs

## 2019-01-23 NOTE — Anesthesia Postprocedure Evaluation (Signed)
Anesthesia Post Note  Patient: Laurie Moreno  Procedure(s) Performed: LEFT MODIFIED RADICAL MASTECTOMY (Left Breast)  Patient location during evaluation: PACU Anesthesia Type: General Level of consciousness: awake, oriented and patient cooperative Pain management: pain level controlled Vital Signs Assessment: post-procedure vital signs reviewed and stable Respiratory status: spontaneous breathing Cardiovascular status: stable Postop Assessment: no apparent nausea or vomiting Anesthetic complications: no Comments: Slow to wake up     Last Vitals:  Vitals:   01/23/19 0945 01/23/19 1000  BP: (!) 162/83 (!) 155/83  Pulse: 79 77  Resp: 13 15  Temp:    SpO2: 95% 92%    Last Pain:  Vitals:   01/23/19 1005  TempSrc:   PainSc: 0-No pain                 Davelle Anselmi A

## 2019-01-23 NOTE — Op Note (Signed)
Patient:  Laurie Moreno  DOB:  Aug 14, 1950  MRN:  [id number]   Preop Diagnosis: Left breast cancer  Postop Diagnosis: Same  Procedure: Left modified radical mastectomy  Surgeon: Aviva Signs, MD  Assistant: Curlene Labrum, MD  Anes: General endotracheal  Indications: Patient is a 68 year old white female who was found on core biopsy to have an invasive ductal carcinoma of the upper, inner quadrant of the left breast.  After extensive discussion with the patient, it was elected to proceed with a left modified radical mastectomy.  The risks and benefits of the procedure including bleeding, infection, cardiopulmonary difficulties, left arm swelling, and the possibility of pain were fully explained to the patient, who gave informed consent.  Procedure note: The patient was placed in the supine position.  After induction of general endotracheal anesthesia, the left breast and axilla were prepped and draped using the usual sterile technique with ChloraPrep.  Surgical site confirmation was performed.  An eliptical incision was made around the left nipple.  A superior flap was formed to the clavicle and an inferior flap was formed to the chest wall.  The breast was then removed from the chest wall with the pectoralis major fascia using Bovie electrocautery medial to lateral.  The suture was placed superiorly for orientation purposes.  The left breast was removed and sent to pathology further examination.  I next conducted a level 2 left axillary dissection.  Several palpable lymph nodes were found.  Care was taken to avoid the thoracodorsal artery vein and nerve as well as the thoracic nerve.  Left axillary contents were sent to pathology further examination.  A bleeding was controlled using small clips and judicious Bovie electrocautery in the left axilla.  The wound was irrigated with normal saline.  A #10 flat Jackson-Pratt drain was placed along the flap and left axilla and brought through separate  stab wound inferior to the incision line.  It was secured to the skin level using a 3-0 nylon interrupted suture.  Subcutaneous layer was reapproximated using a 2-0 Vicryl interrupted suture.  Exparel was instilled into the surrounding wound.  The skin was closed using staples.  A dry sterile dressing and Betadine were applied.  All tape and needle counts were correct at the end of the procedure.  The patient was extubated in the operating room and transferred to PACU in stable condition.  Complications: None  EBL: 50 cc  Specimen: Left breast, suture superior Left axillary contents

## 2019-01-23 NOTE — Anesthesia Procedure Notes (Signed)
Procedure Name: Intubation Date/Time: 01/23/2019 7:28 AM Performed by: Andree Elk, Amy A, CRNA Pre-anesthesia Checklist: Patient identified, Patient being monitored, Timeout performed, Emergency Drugs available and Suction available Patient Re-evaluated:Patient Re-evaluated prior to induction Oxygen Delivery Method: Circle system utilized Preoxygenation: Pre-oxygenation with 100% oxygen Induction Type: IV induction Laryngoscope Size: 3 and Glidescope Grade View: Grade I Tube type: Oral Tube size: 7.0 mm Number of attempts: 1 Airway Equipment and Method: Stylet Placement Confirmation: ETT inserted through vocal cords under direct vision,  positive ETCO2 and breath sounds checked- equal and bilateral Secured at: 21 cm Tube secured with: Tape Dental Injury: Teeth and Oropharynx as per pre-operative assessment

## 2019-01-23 NOTE — Anesthesia Preprocedure Evaluation (Addendum)
Anesthesia Evaluation  Patient identified by MRN, date of birth, ID band Patient awake  General Assessment Comment:Slow to awaken once, her father had breathing issues after   Reviewed: Allergy & Precautions, NPO status , Patient's Chart, lab work & pertinent test results, reviewed documented beta blocker date and time   History of Anesthesia Complications (+) Family history of anesthesia reaction and history of anesthetic complications  Airway Mallampati: III  TM Distance: >3 FB Neck ROM: Full    Dental no notable dental hx. (+) Edentulous Upper, Edentulous Lower   Pulmonary pneumonia, resolved, COPD,  COPD inhaler, Current Smoker,    Pulmonary exam normal breath sounds clear to auscultation       Cardiovascular Exercise Tolerance: Poor +CHF  Normal cardiovascular exam+ dysrhythmias I Rhythm:Regular Rate:Normal     Neuro/Psych Anxiety Bipolar Disorder ? Parkinson's on sinemet negative neurological ROS  negative psych ROS   GI/Hepatic negative GI ROS, Neg liver ROS,   Endo/Other  Hypothyroidism   Renal/GU Renal diseaseS/p Nephrectomy 2019  negative genitourinary   Musculoskeletal negative musculoskeletal ROS (+)   Abdominal   Peds negative pediatric ROS (+)  Hematology negative hematology ROS (+) anemia ,   Anesthesia Other Findings   Reproductive/Obstetrics negative OB ROS                            Anesthesia Physical Anesthesia Plan  ASA: III  Anesthesia Plan: General   Post-op Pain Management:    Induction: Intravenous  PONV Risk Score and Plan: 2 and Ondansetron, Dexamethasone and Treatment may vary due to age or medical condition  Airway Management Planned: Oral ETT  Additional Equipment:   Intra-op Plan:   Post-operative Plan: Extubation in OR  Informed Consent: I have reviewed the patients History and Physical, chart, labs and discussed the procedure including the  risks, benefits and alternatives for the proposed anesthesia with the patient or authorized representative who has indicated his/her understanding and acceptance.     Dental advisory given  Plan Discussed with: CRNA  Anesthesia Plan Comments: (Plan Full PPE use Plan GETA D/W PT -WTP with same after Q&A)        Anesthesia Quick Evaluation

## 2019-01-23 NOTE — Transfer of Care (Signed)
Immediate Anesthesia Transfer of Care Note  Patient: Laurie Moreno  Procedure(s) Performed: LEFT MODIFIED RADICAL MASTECTOMY (Left Breast)  Patient Location: PACU  Anesthesia Type:General  Level of Consciousness: sedated  Airway & Oxygen Therapy: Patient Spontanous Breathing and Patient connected to face mask oxygen  Post-op Assessment: Report given to RN and Post -op Vital signs reviewed and stable  Post vital signs: Reviewed and stable  Last Vitals:  Vitals Value Taken Time  BP 163/73 01/23/19 0925  Temp    Pulse 78 01/23/19 0927  Resp 16 01/23/19 0927  SpO2 95 % 01/23/19 0927  Vitals shown include unvalidated device data.  Last Pain:  Vitals:   01/23/19 0647  TempSrc: Oral  PainSc: 0-No pain      Patients Stated Pain Goal: 7 ([id number])  Complications: No apparent anesthesia complications

## 2019-01-24 LAB — CBC
HCT: 36.2 % (ref 36.0–46.0)
Hemoglobin: 11.6 g/dL — ABNORMAL LOW (ref 12.0–15.0)
MCH: 31.4 pg (ref 26.0–34.0)
MCHC: 32 g/dL (ref 30.0–36.0)
MCV: 97.8 fL (ref 80.0–100.0)
Platelets: 104 10*3/uL — ABNORMAL LOW (ref 150–400)
RBC: 3.7 MIL/uL — ABNORMAL LOW (ref 3.87–5.11)
RDW: 12.8 % (ref 11.5–15.5)
WBC: 7 10*3/uL (ref 4.0–10.5)
nRBC: 0 % (ref 0.0–0.2)

## 2019-01-24 LAB — BASIC METABOLIC PANEL
Anion gap: 7 (ref 5–15)
BUN: 17 mg/dL (ref 8–23)
CO2: 26 mmol/L (ref 22–32)
Calcium: 7.9 mg/dL — ABNORMAL LOW (ref 8.9–10.3)
Chloride: 108 mmol/L (ref 98–111)
Creatinine, Ser: 0.74 mg/dL (ref 0.44–1.00)
GFR calc Af Amer: >60 mL/min (ref >60)
GFR calc non Af Amer: >60 mL/min (ref >60)
Glucose, Bld: 160 mg/dL — ABNORMAL HIGH (ref 70–99)
Potassium: 4.5 mmol/L (ref 3.5–5.1)
Sodium: 141 mmol/L (ref 135–145)

## 2019-01-24 LAB — MAGNESIUM: Magnesium: 2 mg/dL (ref 1.7–2.4)

## 2019-01-24 LAB — PHOSPHORUS: Phosphorus: 4.2 mg/dL (ref 2.5–4.6)

## 2019-01-24 NOTE — Plan of Care (Signed)

## 2019-01-24 NOTE — Progress Notes (Signed)
1 Day Post-Op  Subjective: Minimal incisional pain.  Objective: Vital signs in last 24 hours: Temp:  [97.7 F (36.5 C)-98.6 F (37 C)] 98.5 F (36.9 C) (09/22 0506) Pulse Rate:  [72-83] 81 (09/22 0506) Resp:  [13-21] 18 (09/22 0506) BP: (137-163)/(69-89) 150/69 (09/22 0506) SpO2:  [92 %-97 %] 92 % (09/22 0813) Weight:  [88.8 kg] 88.8 kg (09/21 1051)    Intake/Output from previous day: 09/21 0701 - 09/22 0700 In: 2019.1 [I.V.:1919.1] Out: [id number] [Urine:850; Drains:475; Blood:50] Intake/Output this shift: Total I/O In: -  Out: 25 [Blood:25]  General appearance: alert, cooperative and no distress Resp: clear to auscultation bilaterally Breasts: Left mastectomy incision healing well.  No hematoma or seroma.  JP drainage serosanguineous in nature. Cardio: regular rate and rhythm, S1, S2 normal, no murmur, click, rub or gallop  Lab Results:  Recent Labs    01/24/19 0640  WBC 7.0  HGB 11.6*  HCT 36.2  PLT 104*   BMET Recent Labs    01/24/19 0640  NA 141  K 4.5  CL 108  CO2 26  GLUCOSE 160*  BUN 17  CREATININE 0.74  CALCIUM 7.9*   PT/INR No results for input(s): LABPROT, INR in the last 72 hours.  Studies/Results: No results found.  Anti-infectives: Anti-infectives (From admission, onward)   Start     Dose/Rate Route Frequency Ordered Stop   01/23/19 0630  vancomycin (VANCOCIN) IVPB 1000 mg/200 mL premix     1,000 mg 200 mL/hr over 60 Minutes Intravenous On call to O.R. 01/23/19 0620 01/23/19 1136      Assessment/Plan: s/p Procedure(s): LEFT MODIFIED RADICAL MASTECTOMY Impression: Stable on postoperative day 1.  Will start ambulating patient.  Monitor JP drainage.  Anticipate discharge tomorrow.  LOS: 1 day    Aviva Signs 01/24/2019

## 2019-01-25 ENCOUNTER — Encounter (HOSPITAL_COMMUNITY): Payer: Self-pay | Admitting: General Surgery

## 2019-01-25 LAB — BASIC METABOLIC PANEL
Anion gap: 7 (ref 5–15)
BUN: 15 mg/dL (ref 8–23)
CO2: 28 mmol/L (ref 22–32)
Calcium: 8.3 mg/dL — ABNORMAL LOW (ref 8.9–10.3)
Chloride: 104 mmol/L (ref 98–111)
Creatinine, Ser: 0.66 mg/dL (ref 0.44–1.00)
GFR calc Af Amer: >60 mL/min (ref >60)
GFR calc non Af Amer: >60 mL/min (ref >60)
Glucose, Bld: 126 mg/dL — ABNORMAL HIGH (ref 70–99)
Potassium: 4.7 mmol/L (ref 3.5–5.1)
Sodium: 139 mmol/L (ref 135–145)

## 2019-01-25 LAB — CBC
HCT: 36 % (ref 36.0–46.0)
Hemoglobin: 11.4 g/dL — ABNORMAL LOW (ref 12.0–15.0)
MCH: 30.8 pg (ref 26.0–34.0)
MCHC: 31.7 g/dL (ref 30.0–36.0)
MCV: 97.3 fL (ref 80.0–100.0)
Platelets: 108 10*3/uL — ABNORMAL LOW (ref 150–400)
RBC: 3.7 MIL/uL — ABNORMAL LOW (ref 3.87–5.11)
RDW: 12.9 % (ref 11.5–15.5)
WBC: 7 10*3/uL (ref 4.0–10.5)
nRBC: 0 % (ref 0.0–0.2)

## 2019-01-25 LAB — SURGICAL PATHOLOGY

## 2019-01-25 NOTE — Discharge Instructions (Signed)
Total or Modified Radical Mastectomy, Care After  This sheet gives you information about how to care for yourself after your procedure. Your health care provider may also give you more specific instructions. If you have problems or questions, contact your health care provider.  What can I expect after the procedure?  After the procedure, it is common to have:   Pain.   Numbness.   Stiffness in the arm or shoulder.   Feelings of stress, sadness, or depression.  If the lymph nodes under your arm were removed, you may have arm swelling, weakness, or numbness on the same side of your body as your surgery.  Follow these instructions at home:  Incision care     Follow instructions from your health care provider about how to take care of your incision. Make sure you:  ? Wash your hands with soap and water before you change your bandage (dressing). If soap and water are not available, use hand sanitizer.  ? Change your dressing as told by your health care provider.  ? Leave stitches (sutures), skin glue, or adhesive strips in place. These skin closures may need to stay in place for 2 weeks or longer. If adhesive strip edges start to loosen and curl up, you may trim the loose edges. Do not remove adhesive strips completely unless your health care provider tells you to do that.   Check your incision area every day for signs of infection. Check for:  ? Redness, swelling, or more pain.  ? Fluid or blood.  ? Warmth.  ? Pus or a bad smell.   If you were sent home with a surgical drain in place, follow instructions from your health care provider about emptying it.  Bathing   Do not take baths, swim, or use a hot tub until your health care provider approves. Ask your health care provider if you may take showers. You may only be allowed to take sponge baths.  Activity   Return to your normal activities as told by your health care provider. Ask your health care provider what activities are safe for you.   Avoid activities  that take a lot of effort.   Be careful to avoid any activities that could cause an injury to your arm on the side of your surgery.   Do not lift anything that is heavier than 10 lb (4.5 kg), or the limit that you are told, until your health care provider says that it is safe.   Avoid lifting with the arm on the side of your surgery.   Do not carry heavy objects on your shoulder.   After your drain is removed, do exercises to prevent stiffness and swelling in your arm. Talk with your health care provider about which exercises are safe for you.  General instructions   Take over-the-counter and prescription medicines only as told by your health care provider.   You may eat what you usually do.   Keep your arm raised (elevated) above the level of your heart when you are sitting or lying down.   Do not wear tight jewelry on your arm, wrist, or fingers on the side of your surgery.   You may be given a tight sleeve (compression bandage) to wear over your arm on the side of your surgery. Wear this sleeve as told by your health care provider.   Ask your health care provider when you can start wearing a bra or using a breast prosthesis.   Before you   as told by your health care provider. This is important.  Get checked for extra fluid around your lymph nodes (lymphedema) as often as told by your health care provider. Contact a health care provider if:  You have a fever.  Your pain medicine is not working.  Your arm swelling, weakness, or numbness has not improved after a few weeks.  You have new swelling in your breast area or arm.  You have redness, swelling, or more pain in your incision area.  You have fluid or blood coming from your incision.  Your  incision feels warm to the touch.  You have pus or a bad smell coming from your incision. Get help right away if:  You have very bad pain in your breast area or arm.  You have chest pain.  You have difficulty breathing. Summary  Follow instructions from your health care provider about how to take care of your incision. Check your incision area every day for signs of infection.  Ask your health care provider what activities are safe for you.  Keep all follow-up visits as told by your health care provider. This is important.  Make sure you know which symptoms should cause you to contact your health care provider or to get help right away. This information is not intended to replace advice given to you by your health care provider. Make sure you discuss any questions you have with your health care provider. Document Released: 12/12/2003 Document Revised: 06/24/2018 Document Reviewed: 01/22/2017 Elsevier Patient Education  2020 [employer] Surgical drains are used to remove extra fluid that normally builds up in a surgical wound after surgery. A surgical drain helps to heal a surgical wound. Different kinds of surgical drains include:  Active drains. These drains use suction to pull drainage away from the surgical wound. Drainage flows through a tube to a container outside of the body. With these drains, you need to keep the bulb or the drainage container flat (compressed) at all times, except while you empty it. Flattening the bulb or container creates suction.  Passive drains. These drains allow fluid to drain naturally, by gravity. Drainage flows through a tube to a bandage (dressing) or a container outside of the body. Passive drains do not need to be emptied. A drain is placed during surgery. Right after surgery, drainage is usually bright red and a little thicker than water. The drainage may gradually turn yellow or pink and become thinner. It is likely that  your health care provider will remove the drain when the drainage stops or when the amount decreases to 1-2 Tbsp (15-30 mL) during a 24-hour period. Supplies needed:  Tape.  Germ-free cleaning solution (sterile saline).  Cotton swabs.  Split gauze drain sponge: 4 x 4 inches (10 x 10 cm).  Gauze square: 4 x 4 inches (10 x 10 cm). How to care for your surgical drain Care for your drain as told by your health care provider. This is important to help prevent infection. If your drain is placed at your back, or any other hard-to-reach area, ask another person to assist you in performing the following tasks: General care  Keep the skin around the drain dry and covered with a dressing at all times.  Check your drain area every day for signs of infection. Check for: ? Redness, swelling, or pain. ? Pus or a bad smell. ? Cloudy drainage. ? Tenderness or pressure at the drain exit site. Changing the dressing Follow  instructions from your health care provider about how to change your dressing. Change your dressing at least once a day. Change it more often if needed to keep the dressing dry. Make sure you: 1. Gather your supplies. 2. Wash your hands with soap and water before you change your dressing. If soap and water are not available, use hand sanitizer. 3. Remove the old dressing. Avoid using scissors to do that. 4. Wash your hands with soap and water again after removing the old dressing. 5. Use sterile saline to clean your skin around the drain. You may need to use a cotton swab to clean the skin. 6. Place the tube through the slit in a drain sponge. Place the drain sponge so that it covers your wound. 7. Place the gauze square or another drain sponge on top of the drain sponge that is on the wound. Make sure the tube is between those layers. 8. Tape the dressing to your skin. 9. Tape the drainage tube to your skin 1-2 inches (2.5-5 cm) below the place where the tube enters your body. Taping  keeps the tube from pulling on any stitches (sutures) that you have. 10. Wash your hands with soap and water. 11. Write down the color of your drainage and how often you change your dressing. How to empty your active drain  1. Make sure that you have a measuring cup that you can empty your drainage into. 2. Wash your hands with soap and water. If soap and water are not available, use hand sanitizer. 3. Loosen any pins or clips that hold the tube in place. 4. If your health care provider tells you to strip the tube to prevent clots and tube blockages: ? Hold the tube at the skin with one hand. Use your other hand to pinch the tubing with your thumb and first finger. ? Gently move your fingers down the tube while squeezing very lightly. This clears any drainage, clots, or tissue from the tube. ? You may need to do this several times each day to keep the tube clear. Do not pull on the tube. 5. Open the bulb cap or the drain plug. Do not touch the inside of the cap or the bottom of the plug. 6. Turn the device upside down and gently squeeze. 7. Empty all of the drainage into the measuring cup. 8. Compress the bulb or the container and replace the cap or the plug. To compress the bulb or the container, squeeze it firmly in the middle while you close the cap or plug the container. 9. Write down the amount of drainage that you have in each 24-hour period. If you have less than 2 Tbsp (30 mL) of drainage during 24 hours, contact your health care provider. 10. Flush the drainage down the toilet. 11. Wash your hands with soap and water. Contact a health care provider if:  You have redness, swelling, or pain around your drain area.  You have pus or a bad smell coming from your drain area.  You have a fever or chills.  The skin around your drain is warm to the touch.  The amount of drainage that you have is increasing instead of decreasing.  You have drainage that is cloudy.  There is a sudden  stop or a sudden decrease in the amount of drainage that you have.  Your drain tube falls out.  Your active drain does not stay compressed after you empty it. Summary  Surgical drains are used to  remove extra fluid that normally builds up in a surgical wound after surgery.  Different kinds of surgical drains include active drains and passive drains. Active drains use suction to pull drainage away from the surgical wound, and passive drains allow fluid to drain naturally.  It is important to care for your drain to prevent infection. If your drain is placed at your back, or any other hard-to-reach area, ask another person to assist you.  Contact your health care provider if you have redness, swelling, or pain around your drain area. This information is not intended to replace advice given to you by your health care provider. Make sure you discuss any questions you have with your health care provider. Document Released: 04/17/2000 Document Revised: 05/25/2018 Document Reviewed: 05/25/2018 Elsevier Patient Education  2020 [employer].

## 2019-01-25 NOTE — Discharge Summary (Signed)
Physician Discharge Summary  Patient ID: Laurie Moreno MRN: [id number] DOB/AGE: 1951/02/02 68 y.o.  Admit date: 01/23/2019 Discharge date: 01/25/2019  Admission Diagnoses: Left breast carcinoma  Discharge Diagnoses: Same Active Problems:   S/P mastectomy, left COPD, bipolar disorder, history of CHF, thrombocytopenia  Discharged Condition: good  Hospital Course: Patient is a 68 year old white female with multiple medical problems who underwent a left modified radical mastectomy on 01/23/2019 for left breast cancer.  She tolerated the surgery well.  Her postoperative course has been unremarkable.  Her diet was advanced out difficulty.  Her COPD remained baseline.  Mild thrombocytopenia was noted postoperatively.  The patient is being discharged home on 01/25/2019 in good and improving condition.  Final pathology is pending.  Patient is being sent home with home health nurse for wound care.  Treatments: surgery: Left modified radical mastectomy on 01/23/2019  Discharge Exam: Blood pressure (!) 159/83, pulse 77, temperature 98 F (36.7 C), temperature source Oral, resp. rate 16, height 5\' 5"  (1.651 m), weight 88.8 kg, SpO2 95 %. General appearance: alert, cooperative and no distress Resp: clear to auscultation bilaterally Breasts: Left mastectomy surgical site clean and dry.  JP drainage low and serosanguineous. Cardio: regular rate and rhythm, S1, S2 normal, no murmur, click, rub or gallop  Disposition: Discharge disposition: 01-Home or Self Care       Discharge Instructions    Diet - low sodium heart healthy   Complete by: As directed    Increase activity slowly   Complete by: As directed      Allergies as of 01/25/2019      Reactions   Codeine Nausea And Vomiting   In cough syrup cause nausea and vomiting   Penicillins Hives, Rash, Other (See Comments)   Did it involve swelling of the face/tongue/throat, SOB, or low BP? Unknown Did it involve sudden or severe rash/hives, skin  peeling, or any reaction on the inside of your mouth or nose? Unknown Did you need to seek medical attention at a hospital or doctor's office? Unknown When did it last happen?as a child If all above answers are "NO", may proceed with cephalosporin use.       Medication List    TAKE these medications   albuterol 108 (90 Base) MCG/ACT inhaler Commonly known as: VENTOLIN HFA Inhale 2 puffs into the lungs every 6 (six) hours as needed for wheezing or shortness of breath.   aspirin 81 MG chewable tablet Chew 81 mg by mouth every morning.   atorvastatin 40 MG tablet Commonly known as: LIPITOR Take 40 mg by mouth at bedtime.   carbidopa-levodopa 25-100 MG tablet Commonly known as: SINEMET IR Take 1 tablet by mouth 3 (three) times daily.   carboxymethylcellulose 0.5 % Soln Commonly known as: REFRESH PLUS Place 1 drop into both eyes 3 (three) times daily as needed (dry eyes).   carvedilol 3.125 MG tablet Commonly known as: COREG Take 3.125 mg by mouth 2 (two) times daily with a meal.   clonazePAM 0.5 MG tablet Commonly known as: KLONOPIN Take 1 tablet (0.5 mg total) by mouth 2 (two) times daily. What changed: when to take this   divalproex 250 MG DR tablet Commonly known as: DEPAKOTE Take 250 mg by mouth 3 (three) times daily. Do not crush   escitalopram 20 MG tablet Commonly known as: LEXAPRO Take 20 mg by mouth daily.   esomeprazole 20 MG capsule Commonly known as: NEXIUM Take 20 mg by mouth daily at 12 noon.   gabapentin 300  MG capsule Commonly known as: NEURONTIN Take 300 mg by mouth 4 (four) times daily.   hydrOXYzine 25 MG tablet Commonly known as: ATARAX/VISTARIL Take 25 mg by mouth 2 (two) times daily.   levothyroxine 125 MCG tablet Commonly known as: SYNTHROID Take 125 mcg by mouth daily before breakfast.   loratadine 10 MG tablet Commonly known as: CLARITIN Take 10 mg by mouth daily.   multivitamin tablet Take 1 tablet by mouth every morning.    primidone 50 MG tablet Commonly known as: MYSOLINE Take 100 mg by mouth at bedtime.   Vitamin D 50 MCG (2000 UT) Caps Take 2,000 Units by mouth daily.      Follow-up Information    [facility] Follow up on 02/01/2019.   Why: You are scheduled for a telephone visit on Wednesday, September 30th at 1:15pm. Contact information: [address] [place] [address] [phone]        Aviva Signs, MD. Schedule an appointment as soon as possible for a visit on 02/02/2019.   Specialty: General Surgery Contact information: [address] [phone] [phone]           Signed: Aviva Signs 01/25/2019, 11:06 AM

## 2019-01-25 NOTE — Plan of Care (Signed)
  Problem: Health Behavior/Discharge Planning: Goal: Ability to manage health-related needs will improve Outcome: Progressing   

## 2019-01-25 NOTE — Plan of Care (Signed)
  Problem: Education: Goal: Knowledge of General Education information will improve Description: Including pain rating scale, medication(s)/side effects and non-pharmacologic comfort measures Outcome: Adequate for Discharge   Problem: Health Behavior/Discharge Planning: Goal: Ability to manage health-related needs will improve 01/25/2019 1236 by Santa Lighter, RN Outcome: Adequate for Discharge 01/25/2019 1003 by Santa Lighter, RN Outcome: Progressing   Problem: Clinical Measurements: Goal: Ability to maintain clinical measurements within normal limits will improve Outcome: Adequate for Discharge Goal: Will remain free from infection Outcome: Adequate for Discharge Goal: Diagnostic test results will improve Outcome: Adequate for Discharge Goal: Respiratory complications will improve Outcome: Adequate for Discharge Goal: Cardiovascular complication will be avoided Outcome: Adequate for Discharge   Problem: Activity: Goal: Risk for activity intolerance will decrease Outcome: Adequate for Discharge   Problem: Nutrition: Goal: Adequate nutrition will be maintained Outcome: Adequate for Discharge   Problem: Coping: Goal: Level of anxiety will decrease Outcome: Adequate for Discharge   Problem: Elimination: Goal: Will not experience complications related to bowel motility Outcome: Adequate for Discharge Goal: Will not experience complications related to urinary retention Outcome: Adequate for Discharge   Problem: Pain Managment: Goal: General experience of comfort will improve Outcome: Adequate for Discharge   Problem: Safety: Goal: Ability to remain free from injury will improve Outcome: Adequate for Discharge   Problem: Skin Integrity: Goal: Risk for impaired skin integrity will decrease Outcome: Adequate for Discharge

## 2019-01-25 NOTE — Progress Notes (Signed)
Nsg Discharge Note  Admit Date:  01/23/2019 Discharge date: 01/25/2019   [name] to be D/C'd Home per MD order.  AVS completed.  Copy for chart, and copy for patient signed, and dated. Removed IV-clean, dry, intact. Reviewed d/c paperwork with patient and sister. Showed sister how to empty JP drain with teachback. Gave dressing supplies. Wheeled stable patient and belongings to main entrance where she was picked up by her sister. Patient/caregiver able to verbalize understanding.  Discharge Medication: Allergies as of 01/25/2019      Reactions   Codeine Nausea And Vomiting   In cough syrup cause nausea and vomiting   Penicillins Hives, Rash, Other (See Comments)   Did it involve swelling of the face/tongue/throat, SOB, or low BP? Unknown Did it involve sudden or severe rash/hives, skin peeling, or any reaction on the inside of your mouth or nose? Unknown Did you need to seek medical attention at a hospital or doctor's office? Unknown When did it last happen?as a child If all above answers are "NO", may proceed with cephalosporin use.       Medication List    TAKE these medications   albuterol 108 (90 Base) MCG/ACT inhaler Commonly known as: VENTOLIN HFA Inhale 2 puffs into the lungs every 6 (six) hours as needed for wheezing or shortness of breath.   aspirin 81 MG chewable tablet Chew 81 mg by mouth every morning.   atorvastatin 40 MG tablet Commonly known as: LIPITOR Take 40 mg by mouth at bedtime.   carbidopa-levodopa 25-100 MG tablet Commonly known as: SINEMET IR Take 1 tablet by mouth 3 (three) times daily.   carboxymethylcellulose 0.5 % Soln Commonly known as: REFRESH PLUS Place 1 drop into both eyes 3 (three) times daily as needed (dry eyes).   carvedilol 3.125 MG tablet Commonly known as: COREG Take 3.125 mg by mouth 2 (two) times daily with a meal.   clonazePAM 0.5 MG tablet Commonly known as: KLONOPIN Take 1 tablet (0.5 mg total) by mouth 2 (two) times  daily. What changed: when to take this   divalproex 250 MG DR tablet Commonly known as: DEPAKOTE Take 250 mg by mouth 3 (three) times daily. Do not crush   escitalopram 20 MG tablet Commonly known as: LEXAPRO Take 20 mg by mouth daily.   esomeprazole 20 MG capsule Commonly known as: NEXIUM Take 20 mg by mouth daily at 12 noon.   gabapentin 300 MG capsule Commonly known as: NEURONTIN Take 300 mg by mouth 4 (four) times daily.   hydrOXYzine 25 MG tablet Commonly known as: ATARAX/VISTARIL Take 25 mg by mouth 2 (two) times daily.   levothyroxine 125 MCG tablet Commonly known as: SYNTHROID Take 125 mcg by mouth daily before breakfast.   loratadine 10 MG tablet Commonly known as: CLARITIN Take 10 mg by mouth daily.   multivitamin tablet Take 1 tablet by mouth every morning.   primidone 50 MG tablet Commonly known as: MYSOLINE Take 100 mg by mouth at bedtime.   Vitamin D 50 MCG (2000 UT) Caps Take 2,000 Units by mouth daily.       Discharge Assessment: Vitals:   01/24/19 2121 01/25/19 0543  BP: (!) 185/83 (!) 159/83  Pulse: 88 77  Resp: 18 16  Temp: (!) 100.8 F (38.2 C) 98 F (36.7 C)  SpO2: 93% 95%   Skin clean, dry and intact without evidence of skin break down, no evidence of skin tears noted. IV catheter discontinued intact. Site without signs and symptoms  of complications - no redness or edema noted at insertion site, patient denies c/o pain - only slight tenderness at site.  Dressing with slight pressure applied.  D/c Instructions-Education: Discharge instructions given to patient/family with verbalized understanding. D/c education completed with patient/family including follow up instructions, medication list, d/c activities limitations if indicated, with other d/c instructions as indicated by MD - patient able to verbalize understanding, all questions fully answered. Patient instructed to return to ED, call 911, or call MD for any changes in condition.   Patient escorted via [place], and D/C home via private auto.  Santa Lighter, RN 01/25/2019 2:05 PM

## 2019-01-25 NOTE — Care Management (Signed)
Patient to DC home s/p mastectomy. Lives with sister, independent. Discussed with sister HH options. Will refer to [facility]. Bedside RN will educate sister on JP drain.

## 2019-01-25 NOTE — Care Management (Signed)
     Home Health Agency InformationSorted ascending, Select to sort descending  Quality of Patient Care RatingSelect to sort ascending or descending . A rating of 4 or more stars means the agency performed better than most other agencies on selected measures. . A rating of 3 to 3 stars means that the agency performed about the same as most agencies. . A rating of fewer than 3 stars means that the agency's performance was below the average of other agencies on selected measures." class="info" href="javascript:void(0)". A rating of 4 or more stars means the agency performed better than most other agencies on selected measures. . A rating of 3 to 3 stars means that the agency performed about the same as most agencies. . A rating of fewer than 3 stars means that the agency's performance was below the average of other agencies on selected measures." class="info" href="javascript:void(0)"  Patient Survey Summary RatingSelect to sort ascending or descending    [facility] [phone]   [place] to my Favorites  Quality of Patient Care Rating4 out of 5 stars Patient Survey Summary Rating4 out of [place] [phone]   [place] to my [place]  out of 5 stars Patient Survey Summary Rating3 out of [place] [phone]) [phone]   Add [facility], [place] to my Favorites  Quality of Patient Care Rating4 out of 5 stars Patient Survey Summary Rating4 out of 5 stars  [place] [phone]   [place] to my Favorites  Quality of Patient Care Rating4 out of 5 stars Patient Survey Summary Rating4 out of [place] AGE [phone]   [place] AGE to my Favorites  Quality of Patient Care Rating3 out of 5 stars Patient Survey Summary [place] out of 5 stars  [facility] [place] [phone]   Add [facility] [place] to my Favorites  Quality of Patient Care Rating3  out of 5 stars Patient Survey Summary [place] out of 5 stars  [place] [phone]   [place] to my Favorites  Quality of Patient Care Rating3 out of 5 stars Patient Survey Summary Rating4 out of 5 stars  Viewing 1 - 7 of 7 results

## 2019-01-26 NOTE — Care Management (Signed)
01/26/19: 0900: Received call from [facility], they can not accept home health referral. Called patient's sister, Laurie Moreno to discuss. She feels comfortable and confident managing dressing changes and JP Drain. Discussed with patient's bedside RN and she reports sister did well with dressing and JP drain education.   Bedside RN sent patient home with dressing supplies. Laurie Moreno aware that ABD pads are also available at pharmacy such as [facility].   Re-iterated to Laurie Moreno when to call Dr. Arnoldo Morale office with concerns of infection.   Patient has f/u appt next week.

## 2019-02-02 ENCOUNTER — Encounter: Payer: Self-pay | Admitting: General Surgery

## 2019-02-02 ENCOUNTER — Ambulatory Visit (INDEPENDENT_AMBULATORY_CARE_PROVIDER_SITE_OTHER): Payer: Self-pay | Admitting: General Surgery

## 2019-02-02 ENCOUNTER — Other Ambulatory Visit: Payer: Self-pay

## 2019-02-02 VITALS — BP 108/74 | HR 75 | Temp 97.1°F | Resp 18 | Ht 65.0 in | Wt 182.0 lb

## 2019-02-02 DIAGNOSIS — Z09 Encounter for follow-up examination after completed treatment for conditions other than malignant neoplasm: Secondary | ICD-10-CM

## 2019-02-03 NOTE — Progress Notes (Signed)
Subjective:     Laurie Moreno  Here for postoperative visit.  Doing well.  Has had decreasing serosanguineous drainage from her JP drain.  Minimal incisional pain. Objective:    BP 108/74 (BP Location: Right Arm, Patient Position: Sitting, Cuff Size: Normal)   Pulse 75   Temp (!) 97.1 F (36.2 C) (Tympanic)   Resp 18   Ht 5\' 5"  (1.651 m)   Wt 182 lb (82.6 kg)   SpO2 90%   BMI 30.29 kg/m   General:  alert, cooperative and no distress  Left mastectomy incision healing well.  One half of staples removed.  JP drain removed. Final pathology reviewed with patient.     Assessment:    Doing well postoperatively.    Plan:   Will see patient in 1 week for follow-up wound check.

## 2019-02-09 ENCOUNTER — Encounter: Payer: Self-pay | Admitting: General Surgery

## 2019-02-09 ENCOUNTER — Other Ambulatory Visit: Payer: Self-pay

## 2019-02-09 ENCOUNTER — Ambulatory Visit (INDEPENDENT_AMBULATORY_CARE_PROVIDER_SITE_OTHER): Payer: Self-pay | Admitting: General Surgery

## 2019-02-09 VITALS — BP 111/74 | HR 70 | Temp 97.5°F | Resp 16 | Ht 65.0 in | Wt 181.0 lb

## 2019-02-09 DIAGNOSIS — Z09 Encounter for follow-up examination after completed treatment for conditions other than malignant neoplasm: Secondary | ICD-10-CM

## 2019-02-09 NOTE — Progress Notes (Signed)
Subjective:     Laurie Moreno  Here for wound check.  Doing well.  Has minimal incisional pain. Objective:    BP 111/74 (BP Location: Left Arm, Patient Position: Sitting, Cuff Size: Normal)   Pulse 70   Temp (!) 97.5 F (36.4 C) (Tympanic)   Resp 16   Ht 5\' 5"  (1.651 m)   Wt 181 lb (82.1 kg)   SpO2 94%   BMI 30.12 kg/m   General:  alert, cooperative and no distress  Left mastectomy site healing very well.  Remaining staples removed.     Assessment:    Doing well postoperatively.    Plan:   To follow-up with Dr. Delton Coombes of oncology later this month.  Follow-up here as needed.

## 2019-02-22 ENCOUNTER — Other Ambulatory Visit: Payer: Self-pay

## 2019-02-23 ENCOUNTER — Inpatient Hospital Stay (HOSPITAL_COMMUNITY): Payer: Medicare Other | Attending: Hematology | Admitting: Hematology

## 2019-02-23 VITALS — BP 113/71 | HR 72 | Temp 97.7°F | Resp 18 | Wt 182.8 lb

## 2019-02-23 DIAGNOSIS — D696 Thrombocytopenia, unspecified: Secondary | ICD-10-CM | POA: Insufficient documentation

## 2019-02-23 DIAGNOSIS — F17210 Nicotine dependence, cigarettes, uncomplicated: Secondary | ICD-10-CM | POA: Diagnosis not present

## 2019-02-23 DIAGNOSIS — Z780 Asymptomatic menopausal state: Secondary | ICD-10-CM | POA: Diagnosis not present

## 2019-02-23 DIAGNOSIS — C50212 Malignant neoplasm of upper-inner quadrant of left female breast: Secondary | ICD-10-CM | POA: Diagnosis not present

## 2019-02-23 DIAGNOSIS — Z170 Estrogen receptor positive status [ER+]: Secondary | ICD-10-CM | POA: Diagnosis not present

## 2019-02-23 MED ORDER — ANASTROZOLE 1 MG PO TABS: 1.0000 mg | ORAL_TABLET | Freq: Every day | ORAL | 3 refills | Status: DC

## 2019-02-23 NOTE — Progress Notes (Signed)
[place] [address] [place] [address]   CLINIC:  Medical Oncology/Hematology  PCP:  Alfonse Flavors, MD [address] [place] [address] [phone]   REASON FOR VISIT:  Follow-up for left breast cancer.  CURRENT THERAPY: Anastrozole 1 mg daily.  BRIEF ONCOLOGIC HISTORY:  Oncology History  Breast cancer ([place])  11/28/2018 Initial Diagnosis   Breast cancer ([place])   02/23/2019 Cancer Staging   Staging form: Breast, AJCC 8th Edition - Clinical stage from 02/23/2019: Stage IA (cT1b, cN0(sn), cM0, G2, ER+, PR+, HER2-) - Signed by Derek Jack, MD on 02/23/2019      CANCER STAGING: Cancer Staging Breast cancer [facility]) Staging form: Breast, AJCC 8th Edition - Clinical stage from 02/23/2019: Stage IA (cT1b, cN0(sn), cM0, G2, ER+, PR+, HER2-) - Signed by Derek Jack, MD on 02/23/2019    INTERVAL HISTORY:  Laurie Moreno 68 y.o. female seen for follow-up of her left breast cancer after she underwent radical mastectomy on 01/23/2019.  She reports 100% evident 75% energy levels.  Pain in the left arm is reported.  Surgery site is healed well.  Denies any new onset pains.  Denies any nausea, vomiting or diarrhea or constipation.  No fevers or infections reported.    REVIEW OF SYSTEMS:  Review of Systems  All other systems reviewed and are negative.    PAST MEDICAL/SURGICAL HISTORY:  Past Medical History:  Diagnosis Date  . Acute pyelonephritis   . Anxiety   . Bipolar disorder, unspecified ([place])    From [facility] form  . CHF (congestive heart failure) ([place])    see LOV 06-25-16 epic care everywhere ; "sister reports that patient has a 'weak heart'  but they did testing and she hasnt had to go back since"  . Chronic respiratory failure with hypoxia ([place])   . Complication of anesthesia    reports with last surgery on 09-07-17 it took 3 hours for her to wake up and she was on the ventilator post operatively for an additional   8 hours   . COPD (chronic obstructive pulmonary disease) ([place])   . Dermatitis   . Dermatitis   . Dysrhythmia   . Elevated temperature 10/26/2017   temp of 99.9 , denies cold/flu sx; reports she gets frwquent UTIs, patient sister accompanying today  reports she is going to Dr Gloriann Loan (surgeon) office today  for pre-op appt  at 2pm  . Essential tremor   . Family history of adverse reaction to anesthesia    sister gets nausea and vomiting   . Fever   . History of ESBL E. coli infection    10-26-17 this RN did not enter this ESBL inf hx; patient and sister deny hx of ESBL infection and there is no supporting lab work for ESBL infection in epic   . Hyperlipidemia   . Hypothyroidism   . Thrombocytopenia ([place])   . Unspecified hydronephrosis   . Urinary incontinence    Past Surgical History:  Procedure Laterality Date  . CARDIAC CATHETERIZATION  07/15/2016   at [facility]  . COLONOSCOPY  12/2013   Dr. Aaron Edelman C. Lewis at [facility], long tortuous colon, nonbleeding external hemorrhoids, questionable small subcentimeter benign/hyperplastic polyp in the ascending colon, not removed.  Advised to have repeat colonoscopy in 4 years.  . CYSTOSCOPY W/ URETERAL STENT PLACEMENT Left 09/07/2017   Procedure: CYSTOSCOPY WITH RETROGRADE PYELOGRAM/URETERAL STENT PLACEMENT;  Surgeon: Lucas Mallow, MD;  Location: [facility];  Service: Urology;  Laterality: Left;  . EYE SURGERY  5 years ago    bilateral cataract extraction   . eyelid lift Bilateral   . LAPAROSCOPIC NEPHRECTOMY, HAND ASSISTED Left 11/01/2017   Procedure: LEFT HAND ASSISTED LAPAROSCOPIC NEPHRECTOMY;  Surgeon: Lucas Mallow, MD;  Location: [facility];  Service: Urology;  Laterality: Left;  Marland Kitchen MASTECTOMY MODIFIED RADICAL Left 01/23/2019   Procedure: LEFT MODIFIED RADICAL MASTECTOMY;  Surgeon: Aviva Signs, MD;  Location: [facility];  Service: General;  Laterality: Left;     SOCIAL HISTORY:  Social History   Socioeconomic  History  . Marital status: Single    Spouse name: Not on file  . Number of children: 1  . Years of education: Not on file  . Highest education level: Not on file  Occupational History  . Occupation: disability  Social Needs  . Financial resource strain: Not hard at all  . Food insecurity    Worry: Never true    Inability: Never true  . Transportation needs    Medical: No    Non-medical: No  Tobacco Use  . Smoking status: Current Some Day Smoker    Packs/day: 0.50    Years: 45.00    Pack years: 22.50    Types: Cigarettes  . Smokeless tobacco: Never Used  . Tobacco comment: cut down no to about 10 cigarettes per week  Substance and Sexual Activity  . Alcohol use: Not Currently    Comment: no prior history heavy etoh use  . Drug use: Not Currently  . Sexual activity: Not Currently  Lifestyle  . Physical activity    Days per week: 5 days    Minutes per session: 30 min  . Stress: Not at all  Relationships  . Social connections    Talks on phone: More than three times a week    Gets together: More than three times a week    Attends religious service: 1 to 4 times per year    Active member of club or organization: No    Attends meetings of clubs or organizations: Never    Relationship status: Never married  . Intimate partner violence    Fear of current or ex partner: No    Emotionally abused: No    Physically abused: No    Forced sexual activity: No  Other Topics Concern  . Not on file  Social History Narrative  . Not on file    FAMILY HISTORY:  Family History  Problem Relation Age of Onset  . Colon cancer Mother   . Colon cancer Father   . Tremor Father   . Melanoma Brother   . Colon cancer Paternal Grandmother   . Colon cancer Paternal Grandfather     CURRENT MEDICATIONS:  Outpatient Encounter Medications as of 02/23/2019  Medication Sig  . aspirin 81 MG chewable tablet Chew 81 mg by mouth every morning.   Marland Kitchen atorvastatin (LIPITOR) 40 MG tablet Take 40  mg by mouth at bedtime.   . carbidopa-levodopa (SINEMET IR) 25-100 MG tablet Take 1 tablet by mouth 3 (three) times daily.   . carboxymethylcellulose (REFRESH PLUS) 0.5 % SOLN Place 1 drop into both eyes 3 (three) times daily as needed (dry eyes).  . carvedilol (COREG) 3.125 MG tablet Take 3.125 mg by mouth 2 (two) times daily with a meal.  . Cholecalciferol (VITAMIN D) 50 MCG (2000 UT) CAPS Take 2,000 Units by mouth daily.  . clonazePAM (KLONOPIN) 0.5 MG tablet Take 1 tablet (0.5  mg total) by mouth 2 (two) times daily. (Patient taking differently: Take 0.5 mg by mouth daily. )  . divalproex (DEPAKOTE) 250 MG DR tablet Take 250 mg by mouth 3 (three) times daily. Do not crush  . escitalopram (LEXAPRO) 20 MG tablet Take 20 mg by mouth daily.  Marland Kitchen esomeprazole (NEXIUM) 20 MG capsule Take 20 mg by mouth daily at 12 noon.   . gabapentin (NEURONTIN) 300 MG capsule Take 300 mg by mouth 4 (four) times daily.  . hydrOXYzine (ATARAX/VISTARIL) 25 MG tablet Take 25 mg by mouth 2 (two) times daily.   Marland Kitchen levothyroxine (SYNTHROID, LEVOTHROID) 125 MCG tablet Take 125 mcg by mouth daily before breakfast.  . loratadine (CLARITIN) 10 MG tablet Take 10 mg by mouth daily.   . Multiple Vitamin (MULTIVITAMIN) tablet Take 1 tablet by mouth every morning.   . primidone (MYSOLINE) 50 MG tablet Take 100 mg by mouth at bedtime.   Marland Kitchen albuterol (VENTOLIN HFA) 108 (90 Base) MCG/ACT inhaler Inhale 2 puffs into the lungs every 6 (six) hours as needed for wheezing or shortness of breath.  . anastrozole (ARIMIDEX) 1 MG tablet Take 1 tablet (1 mg total) by mouth daily.   No facility-administered encounter medications on file as of 02/23/2019.     ALLERGIES:  Allergies  Allergen Reactions  . Codeine Nausea And Vomiting    In cough syrup cause nausea and vomiting  . Penicillins Hives, Rash and Other (See Comments)    Did it involve swelling of the face/tongue/throat, SOB, or low BP? Unknown Did it involve sudden or severe  rash/hives, skin peeling, or any reaction on the inside of your mouth or nose? Unknown Did you need to seek medical attention at a hospital or doctor's office? Unknown When did it last happen?as a child If all above answers are "NO", may proceed with cephalosporin use.      PHYSICAL EXAM:  ECOG Performance status: 1  Vitals:   02/23/19 1450  BP: 113/71  Pulse: 72  Resp: 18  Temp: 97.7 F (36.5 C)  SpO2: 97%   Filed Weights   02/23/19 1450  Weight: 182 lb 12.8 oz (82.9 kg)    Physical Exam Vitals signs reviewed.  Constitutional:      Appearance: Normal appearance.  Cardiovascular:     Rate and Rhythm: Normal rate.     Heart sounds: Normal heart sounds.  Pulmonary:     Effort: Pulmonary effort is normal.     Breath sounds: Normal breath sounds.  Abdominal:     General: There is no distension.     Palpations: Abdomen is soft. There is no mass.  Skin:    General: Skin is warm.  Neurological:     General: No focal deficit present.     Mental Status: She is alert and oriented to person, place, and time.  Psychiatric:        Mood and Affect: Mood normal.        Behavior: Behavior normal.      LABORATORY DATA:  I have reviewed the labs as listed.  CBC    Component Value Date/Time   WBC 7.0 01/25/2019 0614   RBC 3.70 (L) 01/25/2019 0614   HGB 11.4 (L) 01/25/2019 0614   HCT 36.0 01/25/2019 0614   PLT 108 (L) 01/25/2019 0614   MCV 97.3 01/25/2019 0614   MCH 30.8 01/25/2019 0614   MCHC 31.7 01/25/2019 0614   RDW 12.9 01/25/2019 0614   LYMPHSABS 3.4 01/19/2019 1138  MONOABS 0.7 01/19/2019 1138   EOSABS 0.1 01/19/2019 1138   BASOSABS 0.1 01/19/2019 1138   CMP Latest Ref Rng & Units 01/25/2019 01/24/2019 01/19/2019  Glucose 70 - 99 mg/dL 126(H) 160(H) 79  BUN 8 - 23 mg/dL _0 Creatinine 0.44 - 1.00 mg/dL 0.66 0.74 0.76  Sodium 135 - 145 mmol/L 139 141 139  Potassium 3.5 - 5.1 mmol/L 4.7 4.5 4.6  Chloride 98 - 111 mmol/L 104 108 106  CO2 22 - 32  mmol/L _1 Calcium 8.9 - 10.3 mg/dL 8.3(L) 7.9(L) 8.9  Total Protein 6.1 - 8.1 g/dL - - -  Total Bilirubin 0.2 - 1.2 mg/dL - - -  Alkaline Phos 38 - 126 U/L - - -  AST 10 - 35 U/L - - -  ALT 6 - 29 U/L - - -       DIAGNOSTIC IMAGING:  I have independently reviewed the scans and discussed with the patient.     ASSESSMENT & PLAN:   Breast cancer ([place]) 1.  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  Ultrasound of the left breast showed irregular hypoechoic mass at 11 o'clock position measuring 0.5 x 0.4 x 0.4 cm.  No lymphadenopathy. -Biopsy on 11/15/2018 consistent with IDC, grade 2, ER/PR positive, HER-2 negative, Ki-67 10%. -Left modified radical mastectomy and SLNB on 01/23/2019 with pathology showing 0.8 cm IDC, margins negative, grade 2, pT1b, PN 0. -I have recommended DEXA scan.  We talked about starting her on anastrozole.  We discussed side effects in detail.  I will send a prescription to her pharmacy.  We will see her back in 4 weeks and see how she is tolerating it.  2.  Thrombocytopenia: -CT abdomen and pelvis in April 2019, showed nodular liver consistent with mild cirrhosis.  Spleen was normal. -She had thrombocytopenia ranging from 78-2 72 since 2019. -Ultrasound of the abdomen on 12/29/2018 showed normal-sized spleen. -He does not have any history of alcohol abuse. -Differential diagnosis includes thrombocytopenia from liver disease and immune mediated thrombocytopenia.  3.  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  Glomerulosclerosis and tubular atrophy. -Nephrectomy was reportedly done due to recurrent infections and nonfunctioning kidney.  4.  Tobacco abuse: She smokes about half pack of cigarettes per day for the past 45 years.    Total time spent is 25 minutes with more than 50% of the time  spent face-to-face discussing pathology report, treatment plan, counseling and coordination of care.  Orders placed this encounter:  Orders Placed This Encounter  Procedures  . DG Bone Density  . CBC with Differential/Platelet  . Comprehensive metabolic panel  . Vitamin D 25 hydroxy      Derek Jack, MD [place] [phone]

## 2019-02-23 NOTE — Assessment & Plan Note (Addendum)
1.  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  Ultrasound of the left breast showed irregular hypoechoic mass at 11 o'clock position measuring 0.5 x 0.4 x 0.4 cm.  No lymphadenopathy. -Biopsy on 11/15/2018 consistent with IDC, grade 2, ER/PR positive, HER-2 negative, Ki-67 10%. -Left modified radical mastectomy and SLNB on 01/23/2019 with pathology showing 0.8 cm IDC, margins negative, grade 2, pT1b, PN 0. -I have recommended DEXA scan.  We talked about starting her on anastrozole.  We discussed side effects in detail.  I will send a prescription to her pharmacy.  We will see her back in 4 weeks and see how she is tolerating it.  2.  Thrombocytopenia: -CT abdomen and pelvis in April 2019, showed nodular liver consistent with mild cirrhosis.  Spleen was normal. -She had thrombocytopenia ranging from 78-2 72 since 2019. -Ultrasound of the abdomen on 12/29/2018 showed normal-sized spleen. -He does not have any history of alcohol abuse. -Differential diagnosis includes thrombocytopenia from liver disease and immune mediated thrombocytopenia.  3.  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  Glomerulosclerosis and tubular atrophy. -Nephrectomy was reportedly done due to recurrent infections and nonfunctioning kidney.  4.  Tobacco abuse: She smokes about half pack of cigarettes per day for the past 45 years.

## 2019-02-23 NOTE — Patient Instructions (Addendum)
[place] at [facility] Discharge Instructions  You were seen today by Dr. Delton Coombes. He went over your recent lab results. He will schedule you for a bone density test. He will see you back in 4 weeks for labs and follow up. He will send in a prescription for a pill that you will need to take once everyday at the same time for 5 years.  Thank you for choosing [place] at [facility] to provide your oncology and hematology care.  To afford each patient quality time with our provider, please arrive at least 15 minutes before your scheduled appointment time.   If you have a lab appointment with the [facility] please come in thru the  Main Entrance and check in at the main information desk  You need to re-schedule your appointment should you arrive 10 or more minutes late.  We strive to give you quality time with our providers, and arriving late affects you and other patients whose appointments are after yours.  Also, if you no show three or more times for appointments you may be dismissed from the clinic at the providers discretion.     Again, thank you for choosing [facility].  Our hope is that these requests will decrease the amount of time that you wait before being seen by our physicians.       _____________________________________________________________  Should you have questions after your visit to [facility], please contact our office at [phone] between the hours of 8:00 a.m. and 4:30 p.m.  Voicemails left after 4:00 p.m. will not be returned until the following business day.  For prescription refill requests, have your pharmacy contact our office and allow 72 hours.    Cancer Center Support Programs:   > Cancer Support Group  2nd Tuesday of the month 1pm-2pm, Journey Room

## 2019-03-03 ENCOUNTER — Encounter (HOSPITAL_COMMUNITY): Payer: Self-pay | Admitting: Hematology

## 2019-03-23 ENCOUNTER — Ambulatory Visit (HOSPITAL_COMMUNITY)
Admission: RE | Admit: 2019-03-23 | Discharge: 2019-03-23 | Disposition: A | Discharge status: Home / Self Care | Payer: Medicare Other | Source: Ambulatory Visit | Attending: Hematology | Admitting: Hematology

## 2019-03-23 ENCOUNTER — Other Ambulatory Visit: Payer: Self-pay

## 2019-03-23 ENCOUNTER — Inpatient Hospital Stay (HOSPITAL_COMMUNITY): Payer: Medicare Other | Attending: Hematology

## 2019-03-23 DIAGNOSIS — Z170 Estrogen receptor positive status [ER+]: Secondary | ICD-10-CM | POA: Diagnosis not present

## 2019-03-23 DIAGNOSIS — Z79811 Long term (current) use of aromatase inhibitors: Secondary | ICD-10-CM | POA: Insufficient documentation

## 2019-03-23 DIAGNOSIS — D696 Thrombocytopenia, unspecified: Secondary | ICD-10-CM | POA: Diagnosis not present

## 2019-03-23 DIAGNOSIS — N951 Menopausal and female climacteric states: Secondary | ICD-10-CM | POA: Insufficient documentation

## 2019-03-23 DIAGNOSIS — Z780 Asymptomatic menopausal state: Secondary | ICD-10-CM

## 2019-03-23 DIAGNOSIS — E559 Vitamin D deficiency, unspecified: Secondary | ICD-10-CM | POA: Insufficient documentation

## 2019-03-23 DIAGNOSIS — C50212 Malignant neoplasm of upper-inner quadrant of left female breast: Secondary | ICD-10-CM | POA: Insufficient documentation

## 2019-03-23 DIAGNOSIS — M8580 Other specified disorders of bone density and structure, unspecified site: Secondary | ICD-10-CM | POA: Insufficient documentation

## 2019-03-23 LAB — CBC WITH DIFFERENTIAL/PLATELET
Abs Immature Granulocytes: 0 10*3/uL (ref 0.00–0.07)
Basophils Absolute: 0.1 10*3/uL (ref 0.0–0.1)
Basophils Relative: 1 %
Eosinophils Absolute: 0.1 10*3/uL (ref 0.0–0.5)
Eosinophils Relative: 1 %
HCT: 43.3 % (ref 36.0–46.0)
Hemoglobin: 14.1 g/dL (ref 12.0–15.0)
Immature Granulocytes: 0 %
Lymphocytes Relative: 59 %
Lymphs Abs: 3 10*3/uL (ref 0.7–4.0)
MCH: 30.7 pg (ref 26.0–34.0)
MCHC: 32.6 g/dL (ref 30.0–36.0)
MCV: 94.1 fL (ref 80.0–100.0)
Monocytes Absolute: 0.6 10*3/uL (ref 0.1–1.0)
Monocytes Relative: 12 %
Neutro Abs: 1.4 10*3/uL — ABNORMAL LOW (ref 1.7–7.7)
Neutrophils Relative %: 27 %
Platelets: 135 10*3/uL — ABNORMAL LOW (ref 150–400)
RBC: 4.6 MIL/uL (ref 3.87–5.11)
RDW: 12.7 % (ref 11.5–15.5)
WBC: 5.1 10*3/uL (ref 4.0–10.5)
nRBC: 0 % (ref 0.0–0.2)

## 2019-03-23 LAB — COMPREHENSIVE METABOLIC PANEL
ALT: 9 U/L (ref 0–44)
AST: 17 U/L (ref 15–41)
Albumin: 4.1 g/dL (ref 3.5–5.0)
Alkaline Phosphatase: 44 U/L (ref 38–126)
Anion gap: 9 (ref 5–15)
BUN: 19 mg/dL (ref 8–23)
CO2: 30 mmol/L (ref 22–32)
Calcium: 9.3 mg/dL (ref 8.9–10.3)
Chloride: 101 mmol/L (ref 98–111)
Creatinine, Ser: 0.75 mg/dL (ref 0.44–1.00)
GFR calc Af Amer: >60 mL/min (ref >60)
GFR calc non Af Amer: >60 mL/min (ref >60)
Glucose, Bld: 111 mg/dL — ABNORMAL HIGH (ref 70–99)
Potassium: 5.4 mmol/L — ABNORMAL HIGH (ref 3.5–5.1)
Sodium: 140 mmol/L (ref 135–145)
Total Bilirubin: 1 mg/dL (ref 0.3–1.2)
Total Protein: 6.6 g/dL (ref 6.5–8.1)

## 2019-03-23 LAB — VITAMIN D 25 HYDROXY (VIT D DEFICIENCY, FRACTURES): Vit D, 25-Hydroxy: 21.32 ng/mL — ABNORMAL LOW (ref 30–100)

## 2019-03-27 ENCOUNTER — Encounter (HOSPITAL_COMMUNITY): Payer: Self-pay | Admitting: Hematology

## 2019-03-27 ENCOUNTER — Other Ambulatory Visit: Payer: Self-pay

## 2019-03-27 ENCOUNTER — Inpatient Hospital Stay (HOSPITAL_BASED_OUTPATIENT_CLINIC_OR_DEPARTMENT_OTHER): Payer: Medicare Other | Admitting: Hematology

## 2019-03-27 VITALS — BP 104/79 | HR 81 | Temp 97.3°F | Resp 16 | Wt 183.3 lb

## 2019-03-27 DIAGNOSIS — Z170 Estrogen receptor positive status [ER+]: Secondary | ICD-10-CM | POA: Diagnosis not present

## 2019-03-27 DIAGNOSIS — C50212 Malignant neoplasm of upper-inner quadrant of left female breast: Secondary | ICD-10-CM

## 2019-03-27 DIAGNOSIS — M8580 Other specified disorders of bone density and structure, unspecified site: Secondary | ICD-10-CM

## 2019-03-27 MED ORDER — ERGOCALCIFEROL 1.25 MG (50000 UT) PO CAPS: 50000.0000 [IU] | ORAL_CAPSULE | ORAL | 1 refills | Status: DC

## 2019-03-27 NOTE — Patient Instructions (Addendum)
[place] at [facility] Discharge Instructions  You were seen today by Dr. Delton Coombes. He went over your recent lab results. He would like to start you on Prolia injections that will help prevent Osteoporosis, you will get these injections every 6 months. He will send in a prescription for vitamin D 50,000 units that you will take once a week. Continue taking your over the counter calcium daily and add a daily stool softener to help with constipation. He will see you back in 3 months for labs and follow up.   Thank you for choosing [place] at [facility] to provide your oncology and hematology care.  To afford each patient quality time with our provider, please arrive at least 15 minutes before your scheduled appointment time.   If you have a lab appointment with the [facility] please come in thru the  Main Entrance and check in at the main information desk  You need to re-schedule your appointment should you arrive 10 or more minutes late.  We strive to give you quality time with our providers, and arriving late affects you and other patients whose appointments are after yours.  Also, if you no show three or more times for appointments you may be dismissed from the clinic at the providers discretion.     Again, thank you for choosing [facility].  Our hope is that these requests will decrease the amount of time that you wait before being seen by our physicians.       _____________________________________________________________  Should you have questions after your visit to [facility], please contact our office at [phone] between the hours of 8:00 a.m. and 4:30 p.m.  Voicemails left after 4:00 p.m. will not be returned until the following business day.  For prescription refill requests, have your pharmacy contact our office and allow 72 hours.    Cancer Center Support Programs:   > Cancer Support Group  2nd  Tuesday of the month 1pm-2pm, Journey Room

## 2019-03-27 NOTE — Progress Notes (Signed)
[place] [address], [place] [address]   CLINIC:  Medical Oncology/Hematology  PCP:  Alfonse Flavors, MD [address] [place] [address] [phone]   REASON FOR VISIT:  Follow-up for left breast cancer.  CURRENT THERAPY: Anastrozole 1 mg daily.  BRIEF ONCOLOGIC HISTORY:  Oncology History  Breast cancer ([place])  11/28/2018 Initial Diagnosis   Breast cancer ([place])   02/23/2019 Cancer Staging   Staging form: Breast, AJCC 8th Edition - Clinical stage from 02/23/2019: Stage IA (cT1b, cN0(sn), cM0, G2, ER+, PR+, HER2-) - Signed by Derek Jack, MD on 02/23/2019      CANCER STAGING: Cancer Staging Breast cancer [facility]) Staging form: Breast, AJCC 8th Edition - Clinical stage from 02/23/2019: Stage IA (cT1b, cN0(sn), cM0, G2, ER+, PR+, HER2-) - Signed by Derek Jack, MD on 02/23/2019    INTERVAL HISTORY:  Ms. Iten 68 y.o. female seen for follow-up of breast cancer.  She has started taking anastrozole about 4 weeks ago.  She reported hot flashes predominantly at nighttime.  She has baseline constipation which is stable.  No musculoskeletal symptoms reported.  Appetite is 100%.  Energy levels are 75%.  No pain is reported.  Denies any fevers, night sweats or weight loss.  REVIEW OF SYSTEMS:  Review of Systems  Gastrointestinal: Positive for constipation.  Endocrine: Positive for hot flashes.  All other systems reviewed and are negative.    PAST MEDICAL/SURGICAL HISTORY:  Past Medical History:  Diagnosis Date  . Acute pyelonephritis   . Anxiety   . Bipolar disorder, unspecified ([employer])    From [facility] form  . CHF (congestive heart failure) ([place])    see LOV 06-25-16 epic care everywhere ; "sister reports that patient has a 'weak heart'  but they did testing and she hasnt had to go back since"  . Chronic respiratory failure with hypoxia ([place])   . Complication of anesthesia    reports with last surgery on 09-07-17  it took 3 hours for her to wake up and she was on the ventilator post operatively for an additional  8 hours   . COPD (chronic obstructive pulmonary disease) ([place])   . Dermatitis   . Dermatitis   . Dysrhythmia   . Elevated temperature 10/26/2017   temp of 99.9 , denies cold/flu sx; reports she gets frwquent UTIs, patient sister accompanying today  reports she is going to Dr Gloriann Loan (surgeon) office today  for pre-op appt  at 2pm  . Essential tremor   . Family history of adverse reaction to anesthesia    sister gets nausea and vomiting   . Fever   . History of ESBL E. coli infection    10-26-17 this RN did not enter this ESBL inf hx; patient and sister deny hx of ESBL infection and there is no supporting lab work for ESBL infection in epic   . Hyperlipidemia   . Hypothyroidism   . Thrombocytopenia ([place])   . Unspecified hydronephrosis   . Urinary incontinence    Past Surgical History:  Procedure Laterality Date  . CARDIAC CATHETERIZATION  07/15/2016   at [facility]  . COLONOSCOPY  12/2013   Dr. Aaron Edelman C. [facility] at [facility], long tortuous colon, nonbleeding external hemorrhoids, questionable small subcentimeter benign/hyperplastic polyp in the ascending colon, not removed.  Advised to have repeat colonoscopy in 4 years.  . CYSTOSCOPY W/ URETERAL STENT PLACEMENT Left 09/07/2017   Procedure: CYSTOSCOPY WITH RETROGRADE PYELOGRAM/URETERAL STENT PLACEMENT;  Surgeon: Lucas Mallow, MD;  Location: [facility] ORS;  Service: Urology;  Laterality: Left;  . EYE SURGERY  5 years ago    bilateral cataract extraction   . eyelid lift Bilateral   . LAPAROSCOPIC NEPHRECTOMY, HAND ASSISTED Left 11/01/2017   Procedure: LEFT HAND ASSISTED LAPAROSCOPIC NEPHRECTOMY;  Surgeon: Lucas Mallow, MD;  Location: [facility]S;  Service: Urology;  Laterality: Left;  Marland Kitchen MASTECTOMY MODIFIED RADICAL Left 01/23/2019   Procedure: LEFT MODIFIED RADICAL MASTECTOMY;  Surgeon: Aviva Signs, MD;  Location: [facility]  ORS;  Service: General;  Laterality: Left;     SOCIAL HISTORY:  Social History   Socioeconomic History  . Marital status: Single    Spouse name: Not on file  . Number of children: 1  . Years of education: Not on file  . Highest education level: Not on file  Occupational History  . Occupation: disability  Social Needs  . Financial resource strain: Not hard at all  . Food insecurity    Worry: Never true    Inability: Never true  . Transportation needs    Medical: No    Non-medical: No  Tobacco Use  . Smoking status: Current Some Day Smoker    Packs/day: 0.50    Years: 45.00    Pack years: 22.50    Types: Cigarettes  . Smokeless tobacco: Never Used  . Tobacco comment: cut down no to about 10 cigarettes per week  Substance and Sexual Activity  . Alcohol use: Not Currently    Comment: no prior history heavy etoh use  . Drug use: Not Currently  . Sexual activity: Not Currently  Lifestyle  . Physical activity    Days per week: 5 days    Minutes per session: 30 min  . Stress: Not at all  Relationships  . Social connections    Talks on phone: More than three times a week    Gets together: More than three times a week    Attends religious service: 1 to 4 times per year    Active member of club or organization: No    Attends meetings of clubs or organizations: Never    Relationship status: Never married  . Intimate partner violence    Fear of current or ex partner: No    Emotionally abused: No    Physically abused: No    Forced sexual activity: No  Other Topics Concern  . Not on file  Social History Narrative  . Not on file    FAMILY HISTORY:  Family History  Problem Relation Age of Onset  . Colon cancer Mother   . Colon cancer Father   . Tremor Father   . Melanoma Brother   . Colon cancer Paternal Grandmother   . Colon cancer Paternal Grandfather     CURRENT MEDICATIONS:  Outpatient Encounter Medications as of 03/27/2019  Medication Sig  . anastrozole  (ARIMIDEX) 1 MG tablet Take 1 tablet (1 mg total) by mouth daily.  Marland Kitchen aspirin 81 MG chewable tablet Chew 81 mg by mouth every morning.   Marland Kitchen atorvastatin (LIPITOR) 40 MG tablet Take 40 mg by mouth at bedtime.   . calcium carbonate (OS-CAL - DOSED IN MG OF ELEMENTAL CALCIUM) 1250 (500 Ca) MG tablet Take 1 tablet by mouth.  . carbidopa-levodopa (SINEMET IR) 25-100 MG tablet Take 1 tablet by mouth 3 (three) times daily.   . carboxymethylcellulose (REFRESH PLUS) 0.5 % SOLN Place 1 drop into both eyes at bedtime.   Marland Kitchen  carvedilol (COREG) 3.125 MG tablet Take 3.125 mg by mouth 2 (two) times daily with a meal.  . Cholecalciferol (VITAMIN D) 50 MCG (2000 UT) CAPS Take 2,000 Units by mouth daily.  . clonazePAM (KLONOPIN) 0.5 MG tablet Take 1 tablet (0.5 mg total) by mouth 2 (two) times daily. (Patient taking differently: Take 0.5 mg by mouth daily. )  . divalproex (DEPAKOTE) 250 MG DR tablet Take 250 mg by mouth 3 (three) times daily. Do not crush  . escitalopram (LEXAPRO) 20 MG tablet Take 20 mg by mouth daily.  Marland Kitchen esomeprazole (NEXIUM) 20 MG capsule Take 20 mg by mouth every other day.   . gabapentin (NEURONTIN) 300 MG capsule Take 300 mg by mouth 4 (four) times daily.  . hydrOXYzine (ATARAX/VISTARIL) 25 MG tablet Take 25 mg by mouth 2 (two) times daily.   Marland Kitchen levothyroxine (SYNTHROID, LEVOTHROID) 125 MCG tablet Take 125 mcg by mouth daily before breakfast.  . loratadine (CLARITIN) 10 MG tablet Take 10 mg by mouth daily.   . magnesium oxide (MAG-OX) 400 MG tablet Take 400 mg by mouth daily.  . Multiple Vitamin (MULTIVITAMIN) tablet Take 1 tablet by mouth every morning.   . primidone (MYSOLINE) 50 MG tablet Take 100 mg by mouth at bedtime.   Marland Kitchen albuterol (VENTOLIN HFA) 108 (90 Base) MCG/ACT inhaler Inhale 2 puffs into the lungs every 6 (six) hours as needed for wheezing or shortness of breath.  . ergocalciferol (VITAMIN D2) 1.25 MG (50000 UT) capsule Take 1 capsule (50,000 Units total) by mouth once a week.    No facility-administered encounter medications on file as of 03/27/2019.     ALLERGIES:  Allergies  Allergen Reactions  . Codeine Nausea And Vomiting    In cough syrup cause nausea and vomiting  . Penicillins Hives, Rash and Other (See Comments)    Did it involve swelling of the face/tongue/throat, SOB, or low BP? Unknown Did it involve sudden or severe rash/hives, skin peeling, or any reaction on the inside of your mouth or nose? Unknown Did you need to seek medical attention at a hospital or doctor's office? Unknown When did it last happen?as a child If all above answers are "NO", may proceed with cephalosporin use.      PHYSICAL EXAM:  ECOG Performance status: 1  Vitals:   03/27/19 1530  BP: 104/79  Pulse: 81  Resp: 16  Temp: (!) 97.3 F (36.3 C)  SpO2: 94%   Filed Weights   03/27/19 1530  Weight: 183 lb 4.8 oz (83.1 kg)    Physical Exam Vitals signs reviewed.  Constitutional:      Appearance: Normal appearance.  Cardiovascular:     Rate and Rhythm: Normal rate.     Heart sounds: Normal heart sounds.  Pulmonary:     Effort: Pulmonary effort is normal.     Breath sounds: Normal breath sounds.  Abdominal:     General: There is no distension.     Palpations: Abdomen is soft. There is no mass.  Skin:    General: Skin is warm.  Neurological:     General: No focal deficit present.     Mental Status: She is alert and oriented to person, place, and time.  Psychiatric:        Mood and Affect: Mood normal.        Behavior: Behavior normal.      LABORATORY DATA:  I have reviewed the labs as listed.  CBC    Component Value Date/Time  WBC 5.1 03/23/2019 1052   RBC 4.60 03/23/2019 1052   HGB 14.1 03/23/2019 1052   HCT 43.3 03/23/2019 1052   PLT 135 (L) 03/23/2019 1052   MCV 94.1 03/23/2019 1052   MCH 30.7 03/23/2019 1052   MCHC 32.6 03/23/2019 1052   RDW 12.7 03/23/2019 1052   LYMPHSABS 3.0 03/23/2019 1052   MONOABS 0.6 03/23/2019 1052   EOSABS  0.1 03/23/2019 1052   BASOSABS 0.1 03/23/2019 1052   CMP Latest Ref Rng & Units 03/23/2019 01/25/2019 01/24/2019  Glucose 70 - 99 mg/dL 111(H) 126(H) 160(H)  BUN 8 - 23 mg/dL '19 15 17  '$ Creatinine 0.44 - 1.00 mg/dL 0.75 0.66 0.74  Sodium 135 - 145 mmol/L 140 139 141  Potassium 3.5 - 5.1 mmol/L 5.4(H) 4.7 4.5  Chloride 98 - 111 mmol/L 101 104 108  CO2 22 - 32 mmol/L '30 28 26  '$ Calcium 8.9 - 10.3 mg/dL 9.3 8.3(L) 7.9(L)  Total Protein 6.5 - 8.1 g/dL 6.6 - -  Total Bilirubin 0.3 - 1.2 mg/dL 1.0 - -  Alkaline Phos 38 - 126 U/L 44 - -  AST 15 - 41 U/L 17 - -  ALT 0 - 44 U/L 9 - -       DIAGNOSTIC IMAGING:  I have independently reviewed the scans and discussed with the patient.     ASSESSMENT & PLAN:   Breast cancer ([place]) 1.  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  Ultrasound of the left breast showed irregular hypoechoic mass at 11 o'clock position measuring 0.5 x 0.4 x 0.4 cm.  No lymphadenopathy. -Biopsy on 11/15/2018 consistent with IDC, grade 2, ER/PR positive, HER-2 negative, Ki-67 10%. -Left modified radical mastectomy and SLNB on 01/23/2019 with pathology showing 0.8 cm IDC, margins negative, grade 2, pT1b, PN 0. -Anastrozole started on 03/03/2019.  She reported hot flashes, predominantly at nighttime.  No musculoskeletal symptoms reported. -I have reviewed her blood work which is grossly within normal limits. -I will see her back in 3 months for follow-up with labs.  2.  Thrombocytopenia: -CT abdomen and pelvis in April 2019, showed nodular liver consistent with mild cirrhosis.  Spleen was normal. -She had thrombocytopenia ranging from 78-272 since 2019. -Ultrasound of the abdomen on 12/29/2018 showed normal-sized spleen. -No history of alcohol abuse.  Differential diagnosis includes thrombocytopenia from liver disease versus ITP.  Latest platelet count is 135.  3.  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  Glomerulosclerosis and tubular atrophy. -Nephrectomy was reportedly done due to recurrent infections and nonfunctioning kidney.  4.  Tobacco abuse: She smokes about half pack of cigarettes per day for the past 45 years.  5.  Osteopenia: -We reviewed results of DEXA scan dated 03/23/2019 which showed T score of -2.4. -As she is on aromatase inhibitor, this is likely to get worse.  I have talked to her about starting her on Prolia to prevent aromatase inhibitor induced worsening of bone mineral density.  We talked about various side effects. -She does not have any teeth and wears dentures.  We will get her started once we get insurance authorization. -She was told to continue calcium supplements.  6.  Vitamin D deficiency:  -Her vitamin D level on 03/23/2019 was 21.32.  She is taking 2000 units of vitamin D daily. -I have sent a prescription for vitamin D 50,000 units once weekly.  We will check another level in 3 months.    Total time spent is 25 minutes with more than 50% of the time spent face-to-face discussing pathology report, treatment plan, counseling and coordination of care.  Orders placed this encounter:  Orders Placed This Encounter  Procedures  . CBC with Differential/Platelet  . Comprehensive metabolic panel  . Vitamin D 25 hydroxy      Derek Jack, MD [place] [phone]

## 2019-03-27 NOTE — Assessment & Plan Note (Signed)
1.  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  Ultrasound of the left breast showed irregular hypoechoic mass at 11 o'clock position measuring 0.5 x 0.4 x 0.4 cm.  No lymphadenopathy. -Biopsy on 11/15/2018 consistent with IDC, grade 2, ER/PR positive, HER-2 negative, Ki-67 10%. -Left modified radical mastectomy and SLNB on 01/23/2019 with pathology showing 0.8 cm IDC, margins negative, grade 2, pT1b, PN 0. -Anastrozole started on 03/03/2019.  She reported hot flashes, predominantly at nighttime.  No musculoskeletal symptoms reported. -I have reviewed her blood work which is grossly within normal limits. -I will see her back in 3 months for follow-up with labs.  2.  Thrombocytopenia: -CT abdomen and pelvis in April 2019, showed nodular liver consistent with mild cirrhosis.  Spleen was normal. -She had thrombocytopenia ranging from 78-272 since 2019. -Ultrasound of the abdomen on 12/29/2018 showed normal-sized spleen. -No history of alcohol abuse.  Differential diagnosis includes thrombocytopenia from liver disease versus ITP.  Latest platelet count is 135.  3.  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  Glomerulosclerosis and tubular atrophy. -Nephrectomy was reportedly done due to recurrent infections and nonfunctioning kidney.  4.  Tobacco abuse: She smokes about half pack of cigarettes per day for the past 45 years.  5.  Osteopenia: -We reviewed results of DEXA scan dated 03/23/2019 which showed T score of -2.4. -As she is on aromatase inhibitor, this is likely to get worse.  I have talked to her about starting her on Prolia to prevent aromatase inhibitor induced worsening of bone mineral density.  We talked about various side effects. -She does not have any teeth and wears dentures.  We will get her started once we get insurance  authorization. -She was told to continue calcium supplements.  6.  Vitamin D deficiency:  -Her vitamin D level on 03/23/2019 was 21.32.  She is taking 2000 units of vitamin D daily. -I have sent a prescription for vitamin D 50,000 units once weekly.  We will check another level in 3 months.

## 2019-04-04 ENCOUNTER — Inpatient Hospital Stay (HOSPITAL_COMMUNITY): Payer: Medicare Other | Attending: Hematology

## 2019-04-04 ENCOUNTER — Other Ambulatory Visit: Payer: Self-pay

## 2019-04-04 ENCOUNTER — Inpatient Hospital Stay (HOSPITAL_COMMUNITY): Payer: Medicare Other

## 2019-04-04 ENCOUNTER — Encounter (HOSPITAL_COMMUNITY): Payer: Self-pay

## 2019-04-04 VITALS — BP 117/81 | HR 76 | Temp 97.9°F | Resp 20

## 2019-04-04 DIAGNOSIS — C50212 Malignant neoplasm of upper-inner quadrant of left female breast: Secondary | ICD-10-CM | POA: Diagnosis not present

## 2019-04-04 DIAGNOSIS — M8580 Other specified disorders of bone density and structure, unspecified site: Secondary | ICD-10-CM | POA: Diagnosis present

## 2019-04-04 DIAGNOSIS — Z170 Estrogen receptor positive status [ER+]: Secondary | ICD-10-CM

## 2019-04-04 LAB — CBC WITH DIFFERENTIAL/PLATELET
Abs Immature Granulocytes: 0.01 10*3/uL (ref 0.00–0.07)
Basophils Absolute: 0.1 10*3/uL (ref 0.0–0.1)
Basophils Relative: 1 %
Eosinophils Absolute: 0.1 10*3/uL (ref 0.0–0.5)
Eosinophils Relative: 2 %
HCT: 45.1 % (ref 36.0–46.0)
Hemoglobin: 14.7 g/dL (ref 12.0–15.0)
Immature Granulocytes: 0 %
Lymphocytes Relative: 54 %
Lymphs Abs: 2.9 10*3/uL (ref 0.7–4.0)
MCH: 30.8 pg (ref 26.0–34.0)
MCHC: 32.6 g/dL (ref 30.0–36.0)
MCV: 94.4 fL (ref 80.0–100.0)
Monocytes Absolute: 0.6 10*3/uL (ref 0.1–1.0)
Monocytes Relative: 11 %
Neutro Abs: 1.7 10*3/uL (ref 1.7–7.7)
Neutrophils Relative %: 32 %
Platelets: 134 10*3/uL — ABNORMAL LOW (ref 150–400)
RBC: 4.78 MIL/uL (ref 3.87–5.11)
RDW: 12.5 % (ref 11.5–15.5)
WBC: 5.3 10*3/uL (ref 4.0–10.5)
nRBC: 0 % (ref 0.0–0.2)

## 2019-04-04 LAB — COMPREHENSIVE METABOLIC PANEL
ALT: 8 U/L (ref 0–44)
AST: 16 U/L (ref 15–41)
Albumin: 4.2 g/dL (ref 3.5–5.0)
Alkaline Phosphatase: 45 U/L (ref 38–126)
Anion gap: 9 (ref 5–15)
BUN: 18 mg/dL (ref 8–23)
CO2: 30 mmol/L (ref 22–32)
Calcium: 9.3 mg/dL (ref 8.9–10.3)
Chloride: 101 mmol/L (ref 98–111)
Creatinine, Ser: 0.79 mg/dL (ref 0.44–1.00)
GFR calc Af Amer: >60 mL/min (ref >60)
GFR calc non Af Amer: >60 mL/min (ref >60)
Glucose, Bld: 130 mg/dL — ABNORMAL HIGH (ref 70–99)
Potassium: 5.2 mmol/L — ABNORMAL HIGH (ref 3.5–5.1)
Sodium: 140 mmol/L (ref 135–145)
Total Bilirubin: 1.3 mg/dL — ABNORMAL HIGH (ref 0.3–1.2)
Total Protein: 6.9 g/dL (ref 6.5–8.1)

## 2019-04-04 LAB — VITAMIN D 25 HYDROXY (VIT D DEFICIENCY, FRACTURES): Vit D, 25-Hydroxy: 26.17 ng/mL — ABNORMAL LOW (ref 30–100)

## 2019-04-04 MED ORDER — DENOSUMAB 60 MG/ML ~~LOC~~ SOSY
PREFILLED_SYRINGE | SUBCUTANEOUS | Status: AC
  Filled 2019-04-04: qty 1

## 2019-04-04 MED ORDER — DENOSUMAB 60 MG/ML ~~LOC~~ SOSY
60.0000 mg | PREFILLED_SYRINGE | Freq: Once | SUBCUTANEOUS | Status: AC
  Administered 2019-04-04: 60 mg via SUBCUTANEOUS

## 2019-04-27 IMAGING — DX DG CHEST 2V
3 series · 3 of 3 positions shown · non-contrast
Comparison: Chest x-ray dated September 08, 2017.

CLINICAL DATA: Weakness and shortness of breath.

EXAM:
CHEST - 2 VIEW

[chest lat (1 of 2)]
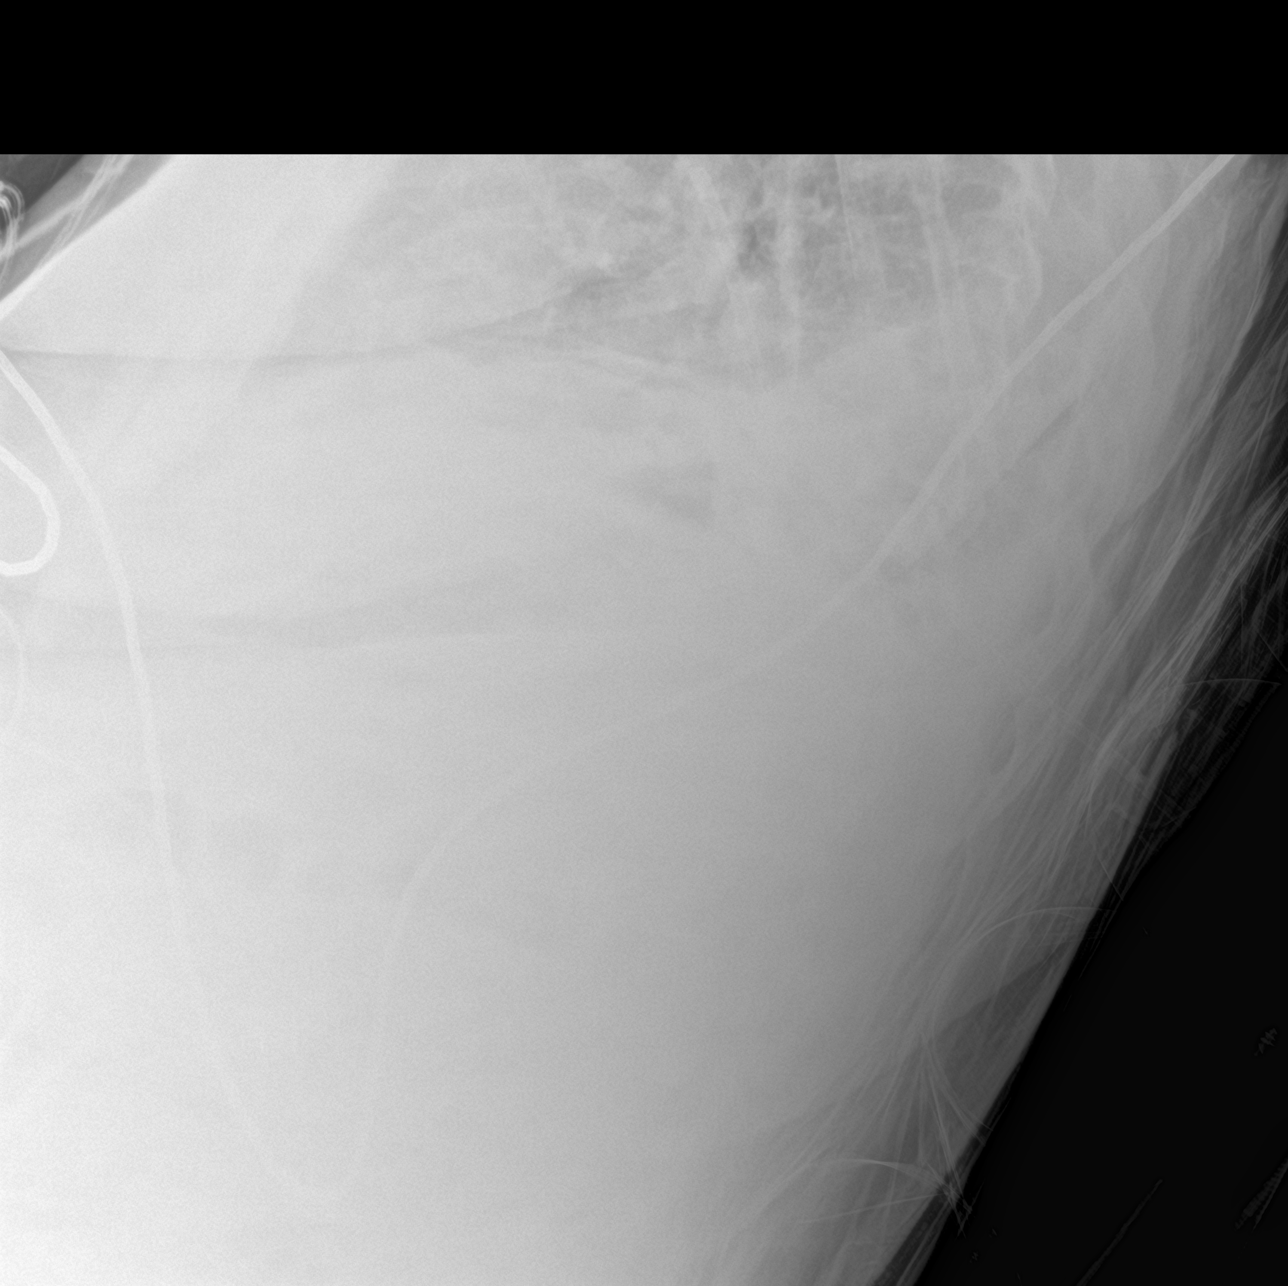

[chest ap]
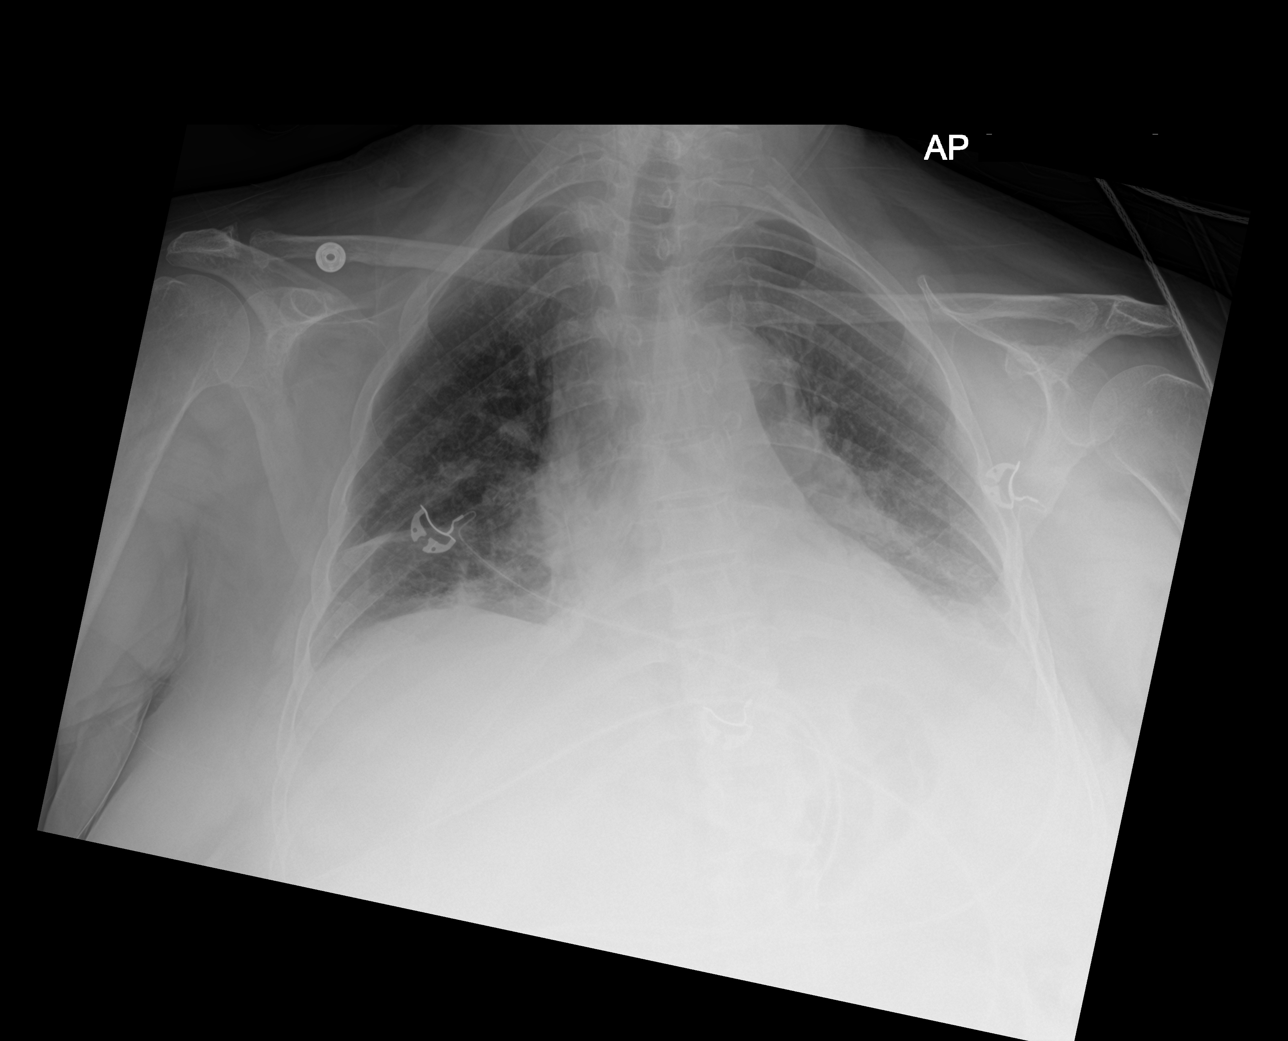

[chest lat (2 of 2)]
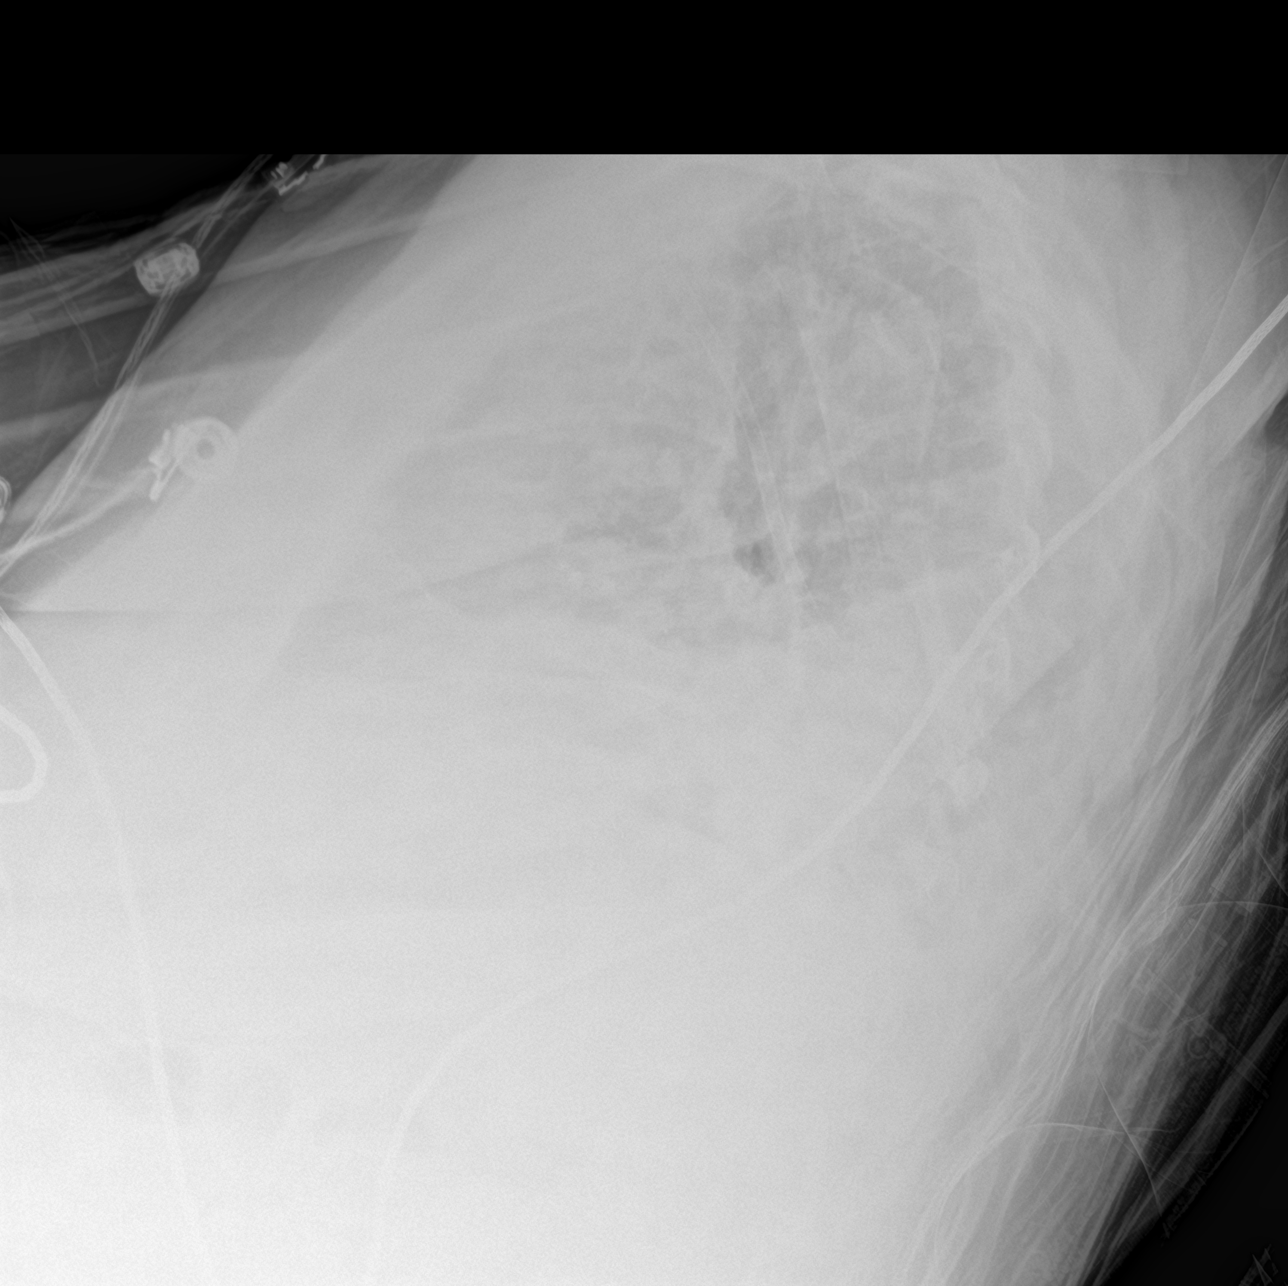

[3 of 3 positions shown; findings below may reference images not displayed]

FINDINGS: Interval removal of the endotracheal tube. Worsened pulmonary
vascular congestion and increasing small bilateral pleural
effusions. Bibasilar atelectasis. No pneumothorax. No acute osseous
abnormality.
IMPRESSION: Worsening pulmonary vascular congestion and increasing small
bilateral pleural effusions.

## 2019-06-19 ENCOUNTER — Other Ambulatory Visit (HOSPITAL_COMMUNITY): Payer: Self-pay | Admitting: *Deleted

## 2019-06-19 DIAGNOSIS — D500 Iron deficiency anemia secondary to blood loss (chronic): Secondary | ICD-10-CM

## 2019-06-19 DIAGNOSIS — C50212 Malignant neoplasm of upper-inner quadrant of left female breast: Secondary | ICD-10-CM

## 2019-06-19 DIAGNOSIS — M8580 Other specified disorders of bone density and structure, unspecified site: Secondary | ICD-10-CM

## 2019-06-20 ENCOUNTER — Inpatient Hospital Stay (HOSPITAL_COMMUNITY): Payer: Medicare Other | Attending: Hematology

## 2019-06-20 ENCOUNTER — Other Ambulatory Visit: Payer: Self-pay

## 2019-06-20 DIAGNOSIS — M8580 Other specified disorders of bone density and structure, unspecified site: Secondary | ICD-10-CM

## 2019-06-20 DIAGNOSIS — Z170 Estrogen receptor positive status [ER+]: Secondary | ICD-10-CM | POA: Insufficient documentation

## 2019-06-20 DIAGNOSIS — N951 Menopausal and female climacteric states: Secondary | ICD-10-CM | POA: Insufficient documentation

## 2019-06-20 DIAGNOSIS — D696 Thrombocytopenia, unspecified: Secondary | ICD-10-CM | POA: Insufficient documentation

## 2019-06-20 DIAGNOSIS — Z79811 Long term (current) use of aromatase inhibitors: Secondary | ICD-10-CM | POA: Diagnosis not present

## 2019-06-20 DIAGNOSIS — E559 Vitamin D deficiency, unspecified: Secondary | ICD-10-CM | POA: Diagnosis not present

## 2019-06-20 DIAGNOSIS — C50912 Malignant neoplasm of unspecified site of left female breast: Secondary | ICD-10-CM | POA: Diagnosis not present

## 2019-06-20 DIAGNOSIS — D500 Iron deficiency anemia secondary to blood loss (chronic): Secondary | ICD-10-CM

## 2019-06-20 DIAGNOSIS — C50212 Malignant neoplasm of upper-inner quadrant of left female breast: Secondary | ICD-10-CM

## 2019-06-20 LAB — COMPREHENSIVE METABOLIC PANEL
ALT: 14 U/L (ref 0–44)
AST: 17 U/L (ref 15–41)
Albumin: 4.1 g/dL (ref 3.5–5.0)
Alkaline Phosphatase: 36 U/L — ABNORMAL LOW (ref 38–126)
Anion gap: 8 (ref 5–15)
BUN: 18 mg/dL (ref 8–23)
CO2: 30 mmol/L (ref 22–32)
Calcium: 8.7 mg/dL — ABNORMAL LOW (ref 8.9–10.3)
Chloride: 101 mmol/L (ref 98–111)
Creatinine, Ser: 0.82 mg/dL (ref 0.44–1.00)
GFR calc Af Amer: >60 mL/min (ref >60)
GFR calc non Af Amer: >60 mL/min (ref >60)
Glucose, Bld: 143 mg/dL — ABNORMAL HIGH (ref 70–99)
Potassium: 4.9 mmol/L (ref 3.5–5.1)
Sodium: 139 mmol/L (ref 135–145)
Total Bilirubin: 1.2 mg/dL (ref 0.3–1.2)
Total Protein: 6.9 g/dL (ref 6.5–8.1)

## 2019-06-20 LAB — CBC WITH DIFFERENTIAL/PLATELET
Abs Immature Granulocytes: 0.01 10*3/uL (ref 0.00–0.07)
Basophils Absolute: 0.1 10*3/uL (ref 0.0–0.1)
Basophils Relative: 1 %
Eosinophils Absolute: 0.1 10*3/uL (ref 0.0–0.5)
Eosinophils Relative: 1 %
HCT: 44.3 % (ref 36.0–46.0)
Hemoglobin: 14.8 g/dL (ref 12.0–15.0)
Immature Granulocytes: 0 %
Lymphocytes Relative: 56 %
Lymphs Abs: 3.1 10*3/uL (ref 0.7–4.0)
MCH: 30.9 pg (ref 26.0–34.0)
MCHC: 33.4 g/dL (ref 30.0–36.0)
MCV: 92.5 fL (ref 80.0–100.0)
Monocytes Absolute: 0.6 10*3/uL (ref 0.1–1.0)
Monocytes Relative: 11 %
Neutro Abs: 1.8 10*3/uL (ref 1.7–7.7)
Neutrophils Relative %: 31 %
Platelets: 110 10*3/uL — ABNORMAL LOW (ref 150–400)
RBC: 4.79 MIL/uL (ref 3.87–5.11)
RDW: 12.3 % (ref 11.5–15.5)
WBC: 5.6 10*3/uL (ref 4.0–10.5)
nRBC: 0 % (ref 0.0–0.2)

## 2019-06-20 LAB — VITAMIN D 25 HYDROXY (VIT D DEFICIENCY, FRACTURES): Vit D, 25-Hydroxy: 36.3 ng/mL (ref 30–100)

## 2019-06-27 ENCOUNTER — Inpatient Hospital Stay (HOSPITAL_BASED_OUTPATIENT_CLINIC_OR_DEPARTMENT_OTHER): Payer: Medicare Other | Admitting: Hematology

## 2019-06-27 ENCOUNTER — Other Ambulatory Visit: Payer: Self-pay

## 2019-06-27 VITALS — BP 113/81 | HR 78 | Temp 97.5°F | Resp 16 | Wt 182.0 lb

## 2019-06-27 DIAGNOSIS — C50212 Malignant neoplasm of upper-inner quadrant of left female breast: Secondary | ICD-10-CM

## 2019-06-27 DIAGNOSIS — Z1231 Encounter for screening mammogram for malignant neoplasm of breast: Secondary | ICD-10-CM

## 2019-06-27 DIAGNOSIS — Z170 Estrogen receptor positive status [ER+]: Secondary | ICD-10-CM

## 2019-06-27 DIAGNOSIS — C50912 Malignant neoplasm of unspecified site of left female breast: Secondary | ICD-10-CM | POA: Diagnosis not present

## 2019-06-27 NOTE — Progress Notes (Signed)
Laurie Moreno [address], [place] [address]   CLINIC:  Medical Oncology/Hematology  PCP:  Laurie Flavors, MD [address] [place] [address] [phone]   REASON FOR VISIT:  Follow-up for left breast cancer.  CURRENT THERAPY: Anastrozole 1 mg daily.  BRIEF ONCOLOGIC HISTORY:  Oncology History  Breast cancer ([place])  11/28/2018 Initial Diagnosis   Breast cancer ([place])   02/23/2019 Cancer Staging   Staging form: Breast, AJCC 8th Edition - Clinical stage from 02/23/2019: Stage IA (cT1b, cN0(sn), cM0, G2, ER+, PR+, HER2-) - Signed by Derek Jack, MD on 02/23/2019      CANCER STAGING: Cancer Staging Breast cancer [facility]) Staging form: Breast, AJCC 8th Edition - Clinical stage from 02/23/2019: Stage IA (cT1b, cN0(sn), cM0, G2, ER+, PR+, HER2-) - Signed by Derek Jack, MD on 02/23/2019    INTERVAL HISTORY:  [name] 69 y.o. female seen for follow-up of left breast cancer.  She is tolerating anastrozole reasonably well.  She has hot flashes at nighttime.  She is taking vitamin D supplements.  Appetite is 100%.  Energy levels are 75%.  Denies any new onset pains.  Denies any tingling or numbness in extremities.  REVIEW OF SYSTEMS:  Review of Systems  Endocrine: Positive for hot flashes.  All other systems reviewed and are negative.    PAST MEDICAL/SURGICAL HISTORY:  Past Medical History:  Diagnosis Date  . Acute pyelonephritis   . Anxiety   . Bipolar disorder, unspecified ([place])    From [facility] form  . CHF (congestive heart failure) ([place])    see LOV 06-25-16 epic care everywhere ; "sister reports that patient has a 'weak heart'  but they did testing and she hasnt had to go back since"  . Chronic respiratory failure with hypoxia ([place])   . Complication of anesthesia    reports with last surgery on 09-07-17 it took 3 hours for her to wake up and she was on the ventilator post operatively for an additional  8 hours     . COPD (chronic obstructive pulmonary disease) ([place])   . Dermatitis   . Dermatitis   . Dysrhythmia   . Elevated temperature 10/26/2017   temp of 99.9 , denies cold/flu sx; reports she gets frwquent UTIs, patient sister accompanying today  reports she is going to Dr Gloriann Loan (surgeon) office today  for pre-op appt  at 2pm  . Essential tremor   . Family history of adverse reaction to anesthesia    sister gets nausea and vomiting   . Fever   . History of ESBL E. coli infection    10-26-17 this RN did not enter this ESBL inf hx; patient and sister deny hx of ESBL infection and there is no supporting lab work for ESBL infection in epic   . Hyperlipidemia   . Hypothyroidism   . Thrombocytopenia ([place])   . Unspecified hydronephrosis   . Urinary incontinence    Past Surgical History:  Procedure Laterality Date  . CARDIAC CATHETERIZATION  07/15/2016   at [facility]  . COLONOSCOPY  12/2013   Dr. Aaron Edelman C. Lewis at [facility], long tortuous colon, nonbleeding external hemorrhoids, questionable small subcentimeter benign/hyperplastic polyp in the ascending colon, not removed.  Advised to have repeat colonoscopy in 4 years.  . CYSTOSCOPY W/ URETERAL STENT PLACEMENT Left 09/07/2017   Procedure: CYSTOSCOPY WITH RETROGRADE PYELOGRAM/URETERAL STENT PLACEMENT;  Surgeon: [place] Mallow, MD;  Location: [facility];  Service:  Urology;  Laterality: Left;  . EYE SURGERY  5 years ago    bilateral cataract extraction   . eyelid lift Bilateral   . LAPAROSCOPIC NEPHRECTOMY, HAND ASSISTED Left 11/01/2017   Procedure: LEFT HAND ASSISTED LAPAROSCOPIC NEPHRECTOMY;  Surgeon: [place] Mallow, MD;  Location: [facility];  Service: Urology;  Laterality: Left;  Marland Kitchen MASTECTOMY MODIFIED RADICAL Left 01/23/2019   Procedure: LEFT MODIFIED RADICAL MASTECTOMY;  Surgeon: Aviva Signs, MD;  Location: [facility];  Service: General;  Laterality: Left;     SOCIAL HISTORY:  Social History   Socioeconomic History  .  Marital status: Single    Spouse name: Not on file  . Number of children: 1  . Years of education: Not on file  . Highest education level: Not on file  Occupational History  . Occupation: disability  Tobacco Use  . Smoking status: Current Some Day Smoker    Packs/day: 0.50    Years: 45.00    Pack years: 22.50    Types: Cigarettes  . Smokeless tobacco: Never Used  . Tobacco comment: cut down no to about 10 cigarettes per week  Substance and Sexual Activity  . Alcohol use: Not Currently    Comment: no prior history heavy etoh use  . Drug use: Not Currently  . Sexual activity: Not Currently  Other Topics Concern  . Not on file  Social History Narrative  . Not on file   Social Determinants of Health   Financial Resource Strain: Low Risk   . Difficulty of Paying Living Expenses: Not hard at all  Food Insecurity: No Food Insecurity  . Worried About Charity fundraiser in the Last Year: Never true  . Ran Out of Food in the Last Year: Never true  Transportation Needs: No Transportation Needs  . Lack of Transportation (Medical): No  . Lack of Transportation (Non-Medical): No  Physical Activity: Sufficiently Active  . Days of Exercise per Week: 5 days  . Minutes of Exercise per Session: 30 min  Stress: No Stress Concern Present  . Feeling of Stress : Not at all  Social Connections: Somewhat Isolated  . Frequency of Communication with Friends and Family: More than three times a week  . Frequency of Social Gatherings with Friends and Family: More than three times a week  . Attends Religious Services: 1 to 4 times per year  . Active Member of Clubs or Organizations: No  . Attends Archivist Meetings: Never  . Marital Status: Never married  Intimate Partner Violence: Not At Risk  . Fear of Current or Ex-Partner: No  . Emotionally Abused: No  . Physically Abused: No  . Sexually Abused: No    FAMILY HISTORY:  Family History  Problem Relation Age of Onset  . Colon  cancer Mother   . Colon cancer Father   . Tremor Father   . Melanoma Brother   . Colon cancer Paternal Grandmother   . Colon cancer Paternal Grandfather     CURRENT MEDICATIONS:  Outpatient Encounter Medications as of 06/27/2019  Medication Sig Note  . anastrozole (ARIMIDEX) 1 MG tablet Take 1 tablet (1 mg total) by mouth daily.   Marland Kitchen aspirin 81 MG chewable tablet Chew 81 mg by mouth every morning.    Marland Kitchen atorvastatin (LIPITOR) 40 MG tablet Take 40 mg by mouth at bedtime.    . calcium carbonate (OS-CAL - DOSED IN MG OF ELEMENTAL CALCIUM) 1250 (500 Ca) MG tablet Take 1 tablet by mouth.   Marland Kitchen  carbidopa-levodopa (SINEMET IR) 25-100 MG tablet Take 1 tablet by mouth 3 (three) times daily.    . carboxymethylcellulose (REFRESH PLUS) 0.5 % SOLN Place 1 drop into both eyes at bedtime.    . carvedilol (COREG) 3.125 MG tablet Take 3.125 mg by mouth 2 (two) times daily with a meal.   . Cholecalciferol (VITAMIN D) 50 MCG (2000 UT) CAPS Take 2,000 Units by mouth daily.   . clonazePAM (KLONOPIN) 0.5 MG tablet Take 1 tablet (0.5 mg total) by mouth 2 (two) times daily. (Patient taking differently: Take 0.5 mg by mouth daily. )   . divalproex (DEPAKOTE) 250 MG DR tablet Take 250 mg by mouth 3 (three) times daily. Do not crush   . ergocalciferol (VITAMIN D2) 1.25 MG (50000 UT) capsule Take 1 capsule (50,000 Units total) by mouth once a week. 06/27/2019: Refills to be sent to [facility] in [place]   . escitalopram (LEXAPRO) 20 MG tablet Take 20 mg by mouth daily.   Marland Kitchen esomeprazole (NEXIUM) 20 MG capsule Take 20 mg by mouth every other day.    . gabapentin (NEURONTIN) 300 MG capsule Take 300 mg by mouth 4 (four) times daily.   . hydrOXYzine (ATARAX/VISTARIL) 25 MG tablet Take 25 mg by mouth 2 (two) times daily.    Marland Kitchen levothyroxine (SYNTHROID, LEVOTHROID) 125 MCG tablet Take 125 mcg by mouth daily before breakfast.   . loratadine (CLARITIN) 10 MG tablet Take 10 mg by mouth daily.    . magnesium oxide  (MAG-OX) 400 MG tablet Take 400 mg by mouth daily.   . Multiple Vitamin (MULTIVITAMIN) tablet Take 1 tablet by mouth every morning.    . primidone (MYSOLINE) 50 MG tablet Take 100 mg by mouth at bedtime.    Marland Kitchen albuterol (VENTOLIN HFA) 108 (90 Base) MCG/ACT inhaler Inhale 2 puffs into the lungs every 6 (six) hours as needed for wheezing or shortness of breath.   Marland Kitchen alum & mag hydroxide-simeth (MAALOX/MYLANTA) 200-200-20 MG/5ML suspension Take 30 mLs by mouth as needed.     No facility-administered encounter medications on file as of 06/27/2019.    ALLERGIES:  Allergies  Allergen Reactions  . Codeine Nausea And Vomiting    In cough syrup cause nausea and vomiting  . Penicillins Hives, Rash and Other (See Comments)    Did it involve swelling of the face/tongue/throat, SOB, or low BP? Unknown Did it involve sudden or severe rash/hives, skin peeling, or any reaction on the inside of your mouth or nose? Unknown Did you need to seek medical attention at a hospital or doctor's office? Unknown When did it last happen?as a child If all above answers are "NO", may proceed with cephalosporin use.      PHYSICAL EXAM:  ECOG Performance status: 1  Vitals:   06/27/19 1531  BP: 113/81  Pulse: 78  Resp: 16  Temp: (!) 97.5 F (36.4 C)  SpO2: 95%   Filed Weights   06/27/19 1531  Weight: 182 lb (82.6 kg)    Physical Exam Vitals reviewed.  Constitutional:      Appearance: Normal appearance.  Cardiovascular:     Rate and Rhythm: Normal rate.     Heart sounds: Normal heart sounds.  Pulmonary:     Effort: Pulmonary effort is normal.     Breath sounds: Normal breath sounds.  Abdominal:     General: There is no distension.     Palpations: Abdomen is soft. There is no mass.  Skin:    General: Skin  is warm.  Neurological:     General: No focal deficit present.     Mental Status: She is alert and oriented to person, place, and time.  Psychiatric:        Mood and Affect: Mood normal.          Behavior: Behavior normal.    Left mastectomy site is within normal limits.  Right breast has no palpable masses.  No palpable adenopathy.  LABORATORY DATA:  I have reviewed the labs as listed.  CBC    Component Value Date/Time   WBC 5.6 06/20/2019 1239   RBC 4.79 06/20/2019 1239   HGB 14.8 06/20/2019 1239   HCT 44.3 06/20/2019 1239   PLT 110 (L) 06/20/2019 1239   MCV 92.5 06/20/2019 1239   MCH 30.9 06/20/2019 1239   MCHC 33.4 06/20/2019 1239   RDW 12.3 06/20/2019 1239   LYMPHSABS 3.1 06/20/2019 1239   MONOABS 0.6 06/20/2019 1239   EOSABS 0.1 06/20/2019 1239   BASOSABS 0.1 06/20/2019 1239   CMP Latest Ref Rng & Units 06/20/2019 04/04/2019 03/23/2019  Glucose 70 - 99 mg/dL 143(H) 130(H) 111(H)  BUN 8 - 23 mg/dL '18 18 19  '$ Creatinine 0.44 - 1.00 mg/dL 0.82 0.79 0.75  Sodium 135 - 145 mmol/L 139 140 140  Potassium 3.5 - 5.1 mmol/L 4.9 5.2(H) 5.4(H)  Chloride 98 - 111 mmol/L 101 101 101  CO2 22 - 32 mmol/L '30 30 30  '$ Calcium 8.9 - 10.3 mg/dL 8.7(L) 9.3 9.3  Total Protein 6.5 - 8.1 g/dL 6.9 6.9 6.6  Total Bilirubin 0.3 - 1.2 mg/dL 1.2 1.3(H) 1.0  Alkaline Phos 38 - 126 U/L 36(L) 45 44  AST 15 - 41 U/L '17 16 17  '$ ALT 0 - 44 U/L '14 8 9       '$ DIAGNOSTIC IMAGING:  I have independently reviewed the scans and discussed with the patient.     ASSESSMENT & PLAN:   Breast cancer ([place]) 1.  Stage Ia (T1BN0) left breast IDC, ER/PR positive, HER-2 negative: -Left modified radical mastectomy and SLNB on 01/23/2019, 0.8 cm IDC, margins negative, grade 2. -Anastrozole started on 03/03/2019. -She has hot flashes at nighttime.  No musculoskeletal symptoms. -I have reviewed her labs including LFTs which are within normal limits.  CBC was also normal. -Last mammogram of the right breast was in June 2020 which was normal. -We will see her back in 4 months.  We will set up mammogram prior to next visit.  2.  Vitamin D deficiency: -Her latest vitamin D level is 36.3.  We will  discontinue weekly vitamin D. -She will continue vitamin D 2000 units daily.  3.  Thrombocytopenia: -She has nodular liver consistent with mild cirrhosis from April 2019.  Spleen was normal. -She has thrombocytopenia ranging between 78-2 72 since 2019.  Ultrasound of the abdomen on 12/29/2018 showed normal-sized spleen. -Differential diagnosis includes thrombocytopenia from liver disease versus ITP.  Latest platelet count is 110.  No bleeding issues.  4.  Osteopenia: -DEXA scan from 03/23/2019 shows T score of -2.4. -Prolia was started on 04/04/2019.  This is to prevent worsening of osteopenia from aromatase innovative.  She will continue calcium and vitamin D supplements.  We will plan to repeat DEXA scan in 2 years.  5.  Tobacco abuse: -She smokes half pack of cigarettes per day for the past 45 years.  Unfortunately she does not qualify for low-dose CT.   Orders placed this encounter:  Orders Placed This Encounter  Procedures  . MM 3D SCREEN BREAST UNI RIGHT      Derek Jack, MD [place] [phone]

## 2019-06-27 NOTE — Patient Instructions (Addendum)
[place] at [facility] Discharge Instructions  You were seen today by Dr. Delton Coombes. He went over your recent lab results. He will repeat your mammogram prior to your next visit. Continue taking vit D. He will see you back in 4 months for labs and follow up.   Thank you for choosing [place] at [facility] to provide your oncology and hematology care.  To afford each patient quality time with our provider, please arrive at least 15 minutes before your scheduled appointment time.   If you have a lab appointment with the [facility] please come in thru the  Main Entrance and check in at the main information desk  You need to re-schedule your appointment should you arrive 10 or more minutes late.  We strive to give you quality time with our providers, and arriving late affects you and other patients whose appointments are after yours.  Also, if you no show three or more times for appointments you may be dismissed from the clinic at the providers discretion.     Again, thank you for choosing [facility].  Our hope is that these requests will decrease the amount of time that you wait before being seen by our physicians.       _____________________________________________________________  Should you have questions after your visit to [facility], please contact our office at [phone] between the hours of 8:00 a.m. and 4:30 p.m.  Voicemails left after 4:00 p.m. will not be returned until the following business day.  For prescription refill requests, have your pharmacy contact our office and allow 72 hours.    Cancer Center Support Programs:   > Cancer Support Group  2nd Tuesday of the month 1pm-2pm, Journey Room

## 2019-06-27 NOTE — Assessment & Plan Note (Signed)
1.  Stage Ia (T1BN0) left breast IDC, ER/PR positive, HER-2 negative: -Left modified radical mastectomy and SLNB on 01/23/2019, 0.8 cm IDC, margins negative, grade 2. -Anastrozole started on 03/03/2019. -She has hot flashes at nighttime.  No musculoskeletal symptoms. -I have reviewed her labs including LFTs which are within normal limits.  CBC was also normal. -Last mammogram of the right breast was in June 2020 which was normal. -We will see her back in 4 months.  We will set up mammogram prior to next visit.  2.  Vitamin D deficiency: -Her latest vitamin D level is 36.3.  We will discontinue weekly vitamin D. -She will continue vitamin D 2000 units daily.  3.  Thrombocytopenia: -She has nodular liver consistent with mild cirrhosis from April 2019.  Spleen was normal. -She has thrombocytopenia ranging between 78-2 72 since 2019.  Ultrasound of the abdomen on 12/29/2018 showed normal-sized spleen. -Differential diagnosis includes thrombocytopenia from liver disease versus ITP.  Latest platelet count is 110.  No bleeding issues.  4.  Osteopenia: -DEXA scan from 03/23/2019 shows T score of -2.4. -Prolia was started on 04/04/2019.  This is to prevent worsening of osteopenia from aromatase innovative.  She will continue calcium and vitamin D supplements.  We will plan to repeat DEXA scan in 2 years.  5.  Tobacco abuse: -She smokes half pack of cigarettes per day for the past 45 years.  Unfortunately she does not qualify for low-dose CT.

## 2019-06-28 ENCOUNTER — Other Ambulatory Visit (HOSPITAL_COMMUNITY): Payer: Self-pay | Admitting: *Deleted

## 2019-06-28 DIAGNOSIS — C50212 Malignant neoplasm of upper-inner quadrant of left female breast: Secondary | ICD-10-CM

## 2019-07-02 NOTE — Progress Notes (Signed)
Primary Care Physician: Zhou-Talbert, [name], MD  Primary Gastroenterologist:  Barney Drain, MD   Chief Complaint  Patient presents with  . Cirrhosis    f/u. Wants to know when she can get the hep B vaccine    HPI: Laurie Moreno is a 69 y.o. female here for f/u. Last seen in 12/2018 for cirrhosis.  Cirrhosis first noted at time of CT in April 2019.  Imaging showed nodular liver consistent with mild cirrhosis.  Spleen normal.  Thrombocytopenia ranging between 17,000-272,000 since 2019.  Labs in August 2020, no evidence of iron overload, immune to hepatitis A, no active hepatitis B nor immunity, hepatitis C antibody nonreactive, ceruloplasmin normal, meld na at that time.  Abdominal ultrasound August 2020 with multiple gallstones, increased echogenicity consistent with fatty infiltration of the liver and/or hepatocellular disease, spleen normal in size, adjacent varices cannot be excluded, left nephrectomy.  Last colonoscopy 12/2013, long tortuous colon.  Nonbleeding external hemorrhoids noted.  Questionable small subcentimeter benign/hyperplastic polyp in the ascending colon, not removed.  Advised to repeat colonoscopy in 4 years. Also has FH of CRC.  Patient presents with sister today.  She is fully recovered from left mastectomy for stage Ia left breast cancer.  Currently on Arimidex.  Overall patient is doing well.  Appetite is good.  Typical heartburn well controlled on generic Nexium, complains of frequent belching.  Would like prescription for generic PPI, will be cheaper than over-the-counter.  Denies dysphagia.  No abdominal pain.  Bowel movements regular.  No blood in the stool or melena.  Received second dose of Covid vaccine this week.  Plans to start hepatitis B vaccination soon but wants to know how long she should wait.  Patient had recent labs with oncology, LFTs unremarkable.  Glucose 143 nonfasting, CBC normal except for platelets of 110,000.   Current Outpatient  Medications  Medication Sig Dispense Refill  . albuterol (VENTOLIN HFA) 108 (90 Base) MCG/ACT inhaler Inhale 2 puffs into the lungs every 6 (six) hours as needed for wheezing or shortness of breath.    Marland Kitchen alum & mag hydroxide-simeth (MAALOX/MYLANTA) 200-200-20 MG/5ML suspension Take 30 mLs by mouth as needed.     Marland Kitchen anastrozole (ARIMIDEX) 1 MG tablet Take 1 tablet (1 mg total) by mouth daily. 90 tablet 3  . aspirin 81 MG chewable tablet Chew 81 mg by mouth every morning.     Marland Kitchen atorvastatin (LIPITOR) 40 MG tablet Take 40 mg by mouth at bedtime.     . calcium carbonate (OS-CAL - DOSED IN MG OF ELEMENTAL CALCIUM) 1250 (500 Ca) MG tablet Take 1 tablet by mouth.    . carbidopa-levodopa (SINEMET IR) 25-100 MG tablet Take 1 tablet by mouth 3 (three) times daily.     . carvedilol (COREG) 3.125 MG tablet Take 3.125 mg by mouth 2 (two) times daily with a meal.    . clonazePAM (KLONOPIN) 0.5 MG tablet Take 1 tablet (0.5 mg total) by mouth 2 (two) times daily. (Patient taking differently: Take 0.5 mg by mouth daily. ) 60 tablet 0  . divalproex (DEPAKOTE) 250 MG DR tablet Take 250 mg by mouth 3 (three) times daily. Do not crush    . escitalopram (LEXAPRO) 20 MG tablet Take 20 mg by mouth daily.    Marland Kitchen esomeprazole (NEXIUM) 20 MG capsule Take 20 mg by mouth daily at 12 noon.     . gabapentin (NEURONTIN) 300 MG capsule Take 300 mg by mouth 4 (four) times daily.    Marland Kitchen  hydrOXYzine (ATARAX/VISTARIL) 25 MG tablet Take 25 mg by mouth 3 (three) times daily.     Marland Kitchen levothyroxine (SYNTHROID, LEVOTHROID) 125 MCG tablet Take 125 mcg by mouth daily before breakfast.    . loratadine (CLARITIN) 10 MG tablet Take 10 mg by mouth daily.     . Multiple Vitamin (MULTIVITAMIN) tablet Take 1 tablet by mouth every morning.     . primidone (MYSOLINE) 50 MG tablet 50mg  in am and 100mg  in PM     No current facility-administered medications for this visit.    Allergies as of 07/03/2019 - Review Complete 07/03/2019  Allergen Reaction  Noted  . Codeine Nausea And Vomiting 04/02/2011  . Penicillins Hives, Rash, and Other (See Comments) 04/02/2011   Past Medical History:  Diagnosis Date  . Acute pyelonephritis   . Anxiety   . Bipolar disorder, unspecified ([place])    From [facility] form  . CHF (congestive heart failure) ([place])    see LOV 06-25-16 epic care everywhere ; "sister reports that patient has a 'weak heart'  but they did testing and she hasnt had to go back since"  . Chronic respiratory failure with hypoxia ([place])   . Complication of anesthesia    reports with last surgery on 09-07-17 it took 3 hours for her to wake up and she was on the ventilator post operatively for an additional  8 hours   . COPD (chronic obstructive pulmonary disease) ([place])   . Dermatitis   . Dermatitis   . Dysrhythmia   . Elevated temperature 10/26/2017   temp of 99.9 , denies cold/flu sx; reports she gets frwquent UTIs, patient sister accompanying today  reports she is going to Dr Gloriann Loan (surgeon) office today  for pre-op appt  at 2pm  . Essential tremor   . Family history of adverse reaction to anesthesia    sister gets nausea and vomiting   . Fever   . History of ESBL E. coli infection    10-26-17 this RN did not enter this ESBL inf hx; patient and sister deny hx of ESBL infection and there is no supporting lab work for ESBL infection in epic   . Hyperlipidemia   . Hypothyroidism   . Thrombocytopenia ([place])   . Unspecified hydronephrosis   . Urinary incontinence    Past Surgical History:  Procedure Laterality Date  . CARDIAC CATHETERIZATION  07/15/2016   at [facility]  . COLONOSCOPY  12/2013   Dr. Aaron Edelman C. Tomy Khim at [facility], long tortuous colon, nonbleeding external hemorrhoids, questionable small subcentimeter benign/hyperplastic polyp in the ascending colon, not removed.  Advised to have repeat colonoscopy in 4 years.  . CYSTOSCOPY W/ URETERAL STENT PLACEMENT Left 09/07/2017   Procedure: CYSTOSCOPY WITH  RETROGRADE PYELOGRAM/URETERAL STENT PLACEMENT;  Surgeon: Lucas Mallow, MD;  Location: [facility];  Service: Urology;  Laterality: Left;  . EYE SURGERY  5 years ago    bilateral cataract extraction   . eyelid lift Bilateral   . LAPAROSCOPIC NEPHRECTOMY, HAND ASSISTED Left 11/01/2017   Procedure: LEFT HAND ASSISTED LAPAROSCOPIC NEPHRECTOMY;  Surgeon: Lucas Mallow, MD;  Location: [facility];  Service: Urology;  Laterality: Left;  Marland Kitchen MASTECTOMY MODIFIED RADICAL Left 01/23/2019   Procedure: LEFT MODIFIED RADICAL MASTECTOMY;  Surgeon: Aviva Signs, MD;  Location: [facility];  Service: General;  Laterality: Left;   Family History  Problem Relation Age of Onset  . Colon cancer Mother   . Colon cancer Father   . Tremor  Father   . Melanoma Brother   . Colon cancer Paternal Grandmother   . Colon cancer Paternal Grandfather    Social History   Tobacco Use  . Smoking status: Current Some Day Smoker    Packs/day: 0.50    Years: 45.00    Pack years: 22.50    Types: Cigarettes  . Smokeless tobacco: Never Used  . Tobacco comment: cut down no to about 10 cigarettes per week  Substance Use Topics  . Alcohol use: Not Currently    Comment: no prior history heavy etoh use  . Drug use: Not Currently     ROS:  General: Negative for anorexia, weight loss, fever, chills, fatigue, weakness. ENT: Negative for hoarseness, difficulty swallowing , nasal congestion. CV: Negative for chest pain, angina, palpitations, dyspnea on exertion, peripheral edema.  Respiratory: Negative for dyspnea at rest, dyspnea on exertion, cough, sputum, wheezing.  GI: See history of present illness. GU:  Negative for dysuria, hematuria, urinary incontinence, urinary frequency, nocturnal urination.  Endo: Negative for unusual weight change.    Physical Examination:   BP 116/79   Pulse 74   Temp (!) 97.2 F (36.2 C) (Oral)   Ht 5\' 5"  (1.651 m)   Wt 183 lb 12.8 oz (83.4 kg)   BMI 30.59 kg/m   General: Well-nourished,  well-developed in no acute distress.  Accompanied by sister Eyes: No icterus. Mouth: masked Lungs: Clear to auscultation bilaterally.  Heart: Regular rate and rhythm, no murmurs rubs or gallops.  Abdomen: Bowel sounds are normal, nontender, nondistended, no hepatosplenomegaly or masses, no abdominal bruits or hernia , no rebound or guarding.   Extremities: No lower extremity edema. No clubbing or deformities. Neuro: Alert and oriented x 4   Skin: Warm and dry, no jaundice.   Psych: Alert and cooperative, normal mood and affect.  Labs:  Lab Results  Component Value Date   CREATININE 0.82 06/20/2019   BUN 18 06/20/2019   NA 139 06/20/2019   K 4.9 06/20/2019   CL 101 06/20/2019   CO2 30 06/20/2019   Lab Results  Component Value Date   ALT 14 06/20/2019   AST 17 06/20/2019   ALKPHOS 36 (L) 06/20/2019   BILITOT 1.2 06/20/2019   Lab Results  Component Value Date   WBC 5.6 06/20/2019   HGB 14.8 06/20/2019   HCT 44.3 06/20/2019   MCV 92.5 06/20/2019   PLT 110 (L) 06/20/2019   Lab Results  Component Value Date   INR 1.1 12/23/2018   INR 1.08 10/26/2017   INR 1.14 08/05/2017     Imaging Studies: No results found.   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  Cirrhosis: Found incidentally on CT imaging in 2019.  Has been well compensated to date.  Etiology not well-defined but suspect Karlene Lineman.  No evidence of iron overload, viral hepatitis.  Continue with usual diagnosis of diabetes, random glucose has been elevated on multiple occasions.  Discussed with patient and her sister that she should follow-up with PCP to be tested.  No A1c is on file.  Cannot exclude cirrhosis related to burned out autoimmune process but with normal LFTs treatment  would not change.  We will plan on updating abdominal ultrasound near future.  She recently had labs, INR not obtained therefore meld score not calculated.  She will get hepatitis  B vaccinations, start around May as she is currently being vaccinated for Covid. Screen for esophageal varices in the near future with EGD.  Plan at time of colonoscopy with deep sedation.  I have discussed the risks, alternatives, benefits with regards to but not limited to the risk of reaction to medication, bleeding, infection, perforation and the patient is agreeable to proceed. Written consent to be obtained.  GERD: Trial of pantoprazole 40 mg daily before breakfast.  History of adenomatous colon polyps/family history colon cancer: Due for surveillance colonoscopy at any time.  Patient would like to postpone until 11 June because of multiple appointments and she is dependent upon her sister for assistance.  Plan for deep sedation.   I have discussed the risks, alternatives, benefits with regards to but not limited to the risk of reaction to medication, bleeding, infection, perforation and the patient is agreeable to proceed. Written consent to be obtained. Patient requesting to avoid Trilyte prep due to difficulty with large volume preps.   Return to the office in 6 months or sooner if needed.  Patient is aware that her procedures will be performed by Dr. Gala Romney and that her care will be transitioned to Dr. Abbey Chatters in 12/2019.

## 2019-07-03 ENCOUNTER — Other Ambulatory Visit: Payer: Self-pay

## 2019-07-03 ENCOUNTER — Encounter: Payer: Self-pay | Admitting: Gastroenterology

## 2019-07-03 ENCOUNTER — Ambulatory Visit (INDEPENDENT_AMBULATORY_CARE_PROVIDER_SITE_OTHER): Payer: Medicare Other | Admitting: Gastroenterology

## 2019-07-03 VITALS — BP 116/79 | HR 74 | Temp 97.2°F | Ht 65.0 in | Wt 183.8 lb

## 2019-07-03 DIAGNOSIS — Z86010 Personal history of colonic polyps: Secondary | ICD-10-CM

## 2019-07-03 DIAGNOSIS — K7460 Unspecified cirrhosis of liver: Secondary | ICD-10-CM

## 2019-07-03 DIAGNOSIS — K219 Gastro-esophageal reflux disease without esophagitis: Secondary | ICD-10-CM | POA: Diagnosis not present

## 2019-07-03 MED ORDER — PANTOPRAZOLE SODIUM 40 MG PO TBEC: 40.0000 mg | DELAYED_RELEASE_TABLET | Freq: Every day | ORAL | 3 refills | Status: DC

## 2019-07-03 MED ORDER — CLENPIQ 10-3.5-12 MG-GM -GM/160ML PO SOLN: 1.0000 | Freq: Once | ORAL | 0 refills | Status: AC

## 2019-07-03 NOTE — Patient Instructions (Signed)
1. Stop esomeprazole (purple pill) and start pantoprazole once daily before breakfast for acid reflux. 2. Upper endoscopy and colonoscopy as scheduled. See separate instructions.  3. Ultrasound of your liver as scheduled.  4. Start Hepatitis B vaccinations around 09/2019. You will need to complete all 3 shots to complete the series.  5. Discuss screening for diabetes with your PCP. 6. Return to our office in six months or call sooner if needed.

## 2019-07-03 NOTE — Patient Instructions (Signed)
PA for EGD submitted via [facility] website. Case approved. PA# [id number], valid 10/12/19-01/10/20.

## 2019-07-24 ENCOUNTER — Ambulatory Visit (HOSPITAL_COMMUNITY): Payer: Medicare Other

## 2019-07-24 ENCOUNTER — Ambulatory Visit (HOSPITAL_COMMUNITY)
Admission: RE | Admit: 2019-07-24 | Discharge: 2019-07-24 | Disposition: A | Discharge status: Home / Self Care | Payer: Medicare Other | Source: Ambulatory Visit | Attending: Gastroenterology | Admitting: Gastroenterology

## 2019-07-24 ENCOUNTER — Other Ambulatory Visit: Payer: Self-pay

## 2019-07-24 DIAGNOSIS — K7460 Unspecified cirrhosis of liver: Secondary | ICD-10-CM | POA: Insufficient documentation

## 2019-07-31 ENCOUNTER — Telehealth: Payer: Self-pay | Admitting: Gastroenterology

## 2019-07-31 NOTE — Telephone Encounter (Signed)
Pt called asking to speak with nurse. I told her LL was on another call and would have to call her back. She has questions about getting Hep shots. [phone]

## 2019-07-31 NOTE — Telephone Encounter (Signed)
Is this something we do? And if so what steps do I take to set this up for the pt?

## 2019-08-01 NOTE — Telephone Encounter (Signed)
We usually write out a prescription for Hep B vaccinations with diagnosis of cirrhosis on the prescription. Patient then takes to either the health dept or one of our local pharmacies that do vaccinations. Nursing staff usually handles.

## 2019-08-01 NOTE — Telephone Encounter (Signed)
Pt was seen in the office on 07/03/2019 by LSL and per her instructions patient needs to start Hep shots in May 2021. Pt has questions about that and asked to speak with the nurse.

## 2019-08-01 NOTE — Telephone Encounter (Signed)
Pt returned call and I notified her that a rx was completed and she could come by there office to pick it up for the hepb vaccines and take it to he pharmacy or the health dept. She stated she did not know if she would do the vaccines or not because another provider told her she did not need them. I notified her that if she decides to do the hep b vaccines the order is at the front desk for her to pick up and that we were closed on 08/04/19 for the holiday. She stated she understood and that thanked me for the call

## 2019-08-01 NOTE — Telephone Encounter (Signed)
Called pt and lmom to pick up rx for hep b vaccines

## 2019-09-07 ENCOUNTER — Other Ambulatory Visit (HOSPITAL_COMMUNITY): Payer: Medicare Other

## 2019-10-04 ENCOUNTER — Encounter (HOSPITAL_COMMUNITY): Payer: Self-pay

## 2019-10-04 ENCOUNTER — Other Ambulatory Visit: Payer: Self-pay

## 2019-10-04 ENCOUNTER — Inpatient Hospital Stay (HOSPITAL_COMMUNITY): Payer: Medicare Other

## 2019-10-04 ENCOUNTER — Inpatient Hospital Stay (HOSPITAL_COMMUNITY): Payer: Medicare Other | Attending: Hematology

## 2019-10-04 VITALS — BP 128/84 | HR 80 | Temp 97.7°F | Resp 18

## 2019-10-04 DIAGNOSIS — M85852 Other specified disorders of bone density and structure, left thigh: Secondary | ICD-10-CM | POA: Insufficient documentation

## 2019-10-04 DIAGNOSIS — Z170 Estrogen receptor positive status [ER+]: Secondary | ICD-10-CM | POA: Insufficient documentation

## 2019-10-04 DIAGNOSIS — D696 Thrombocytopenia, unspecified: Secondary | ICD-10-CM | POA: Diagnosis not present

## 2019-10-04 DIAGNOSIS — M8580 Other specified disorders of bone density and structure, unspecified site: Secondary | ICD-10-CM | POA: Insufficient documentation

## 2019-10-04 DIAGNOSIS — Z79811 Long term (current) use of aromatase inhibitors: Secondary | ICD-10-CM | POA: Insufficient documentation

## 2019-10-04 DIAGNOSIS — C50212 Malignant neoplasm of upper-inner quadrant of left female breast: Secondary | ICD-10-CM | POA: Insufficient documentation

## 2019-10-04 DIAGNOSIS — K7460 Unspecified cirrhosis of liver: Secondary | ICD-10-CM | POA: Diagnosis not present

## 2019-10-04 LAB — CBC WITH DIFFERENTIAL/PLATELET
Abs Immature Granulocytes: 0 10*3/uL (ref 0.00–0.07)
Basophils Absolute: 0.1 10*3/uL (ref 0.0–0.1)
Basophils Relative: 1 %
Eosinophils Absolute: 0.1 10*3/uL (ref 0.0–0.5)
Eosinophils Relative: 1 %
HCT: 43.8 % (ref 36.0–46.0)
Hemoglobin: 14.8 g/dL (ref 12.0–15.0)
Immature Granulocytes: 0 %
Lymphocytes Relative: 52 %
Lymphs Abs: 2.9 10*3/uL (ref 0.7–4.0)
MCH: 31.6 pg (ref 26.0–34.0)
MCHC: 33.8 g/dL (ref 30.0–36.0)
MCV: 93.6 fL (ref 80.0–100.0)
Monocytes Absolute: 0.5 10*3/uL (ref 0.1–1.0)
Monocytes Relative: 10 %
Neutro Abs: 2 10*3/uL (ref 1.7–7.7)
Neutrophils Relative %: 36 %
Platelets: 127 10*3/uL — ABNORMAL LOW (ref 150–400)
RBC: 4.68 MIL/uL (ref 3.87–5.11)
RDW: 12.9 % (ref 11.5–15.5)
WBC: 5.5 10*3/uL (ref 4.0–10.5)
nRBC: 0 % (ref 0.0–0.2)

## 2019-10-04 LAB — COMPREHENSIVE METABOLIC PANEL
ALT: 10 U/L (ref 0–44)
AST: 16 U/L (ref 15–41)
Albumin: 4.2 g/dL (ref 3.5–5.0)
Alkaline Phosphatase: 40 U/L (ref 38–126)
Anion gap: 9 (ref 5–15)
BUN: 16 mg/dL (ref 8–23)
CO2: 26 mmol/L (ref 22–32)
Calcium: 9.1 mg/dL (ref 8.9–10.3)
Chloride: 102 mmol/L (ref 98–111)
Creatinine, Ser: 0.73 mg/dL (ref 0.44–1.00)
GFR calc Af Amer: >60 mL/min (ref >60)
GFR calc non Af Amer: >60 mL/min (ref >60)
Glucose, Bld: 122 mg/dL — ABNORMAL HIGH (ref 70–99)
Potassium: 4.5 mmol/L (ref 3.5–5.1)
Sodium: 137 mmol/L (ref 135–145)
Total Bilirubin: 0.8 mg/dL (ref 0.3–1.2)
Total Protein: 6.8 g/dL (ref 6.5–8.1)

## 2019-10-04 LAB — VITAMIN D 25 HYDROXY (VIT D DEFICIENCY, FRACTURES): Vit D, 25-Hydroxy: 20.96 ng/mL — ABNORMAL LOW (ref 30–100)

## 2019-10-04 MED ORDER — DENOSUMAB 60 MG/ML ~~LOC~~ SOSY
60.0000 mg | PREFILLED_SYRINGE | Freq: Once | SUBCUTANEOUS | Status: AC
  Administered 2019-10-04: 60 mg via SUBCUTANEOUS
  Filled 2019-10-04: qty 1

## 2019-10-04 NOTE — Patient Instructions (Signed)
Laurie Moreno  10/04/2019     @PREFPERIOPPHARMACY @   Your procedure is scheduled on  10/12/2019 .  Report to [facility] at  [phone]  A.M.  Call this number if you have problems the morning of surgery:  [phone]   Remember:  Follow the diet and prep instructions given to you by Dr Roseanne Kaufman office.                     Take these medicines the morning of surgery with A SIP OF WATER  Carvedilol, arimidex, sinemet, depakote, gabapentin, hydroxyzine(if needed), levothyroxine, protonix, primidone.    Do not wear jewelry, make-up or nail polish.  Do not wear lotions, powders, or perfumes. Please wear deodorant and brush your teeth.  Do not shave 48 hours prior to surgery.  Men may shave face and neck.  Do not bring valuables to the hospital.  [facility] is not responsible for any belongings or valuables.  Contacts, dentures or bridgework may not be worn into surgery.  Leave your suitcase in the car.  After surgery it may be brought to your room.  For patients admitted to the hospital, discharge time will be determined by your treatment team.  Patients discharged the day of surgery will not be allowed to drive home.   Name and phone number of your driver:   family Special instructions:  DO NOT smoke the morning of your procedure.  Please read over the following fact sheets that you were given. Anesthesia Post-op Instructions and Care and Recovery After Surgery       Upper Endoscopy, Adult, Care After This sheet gives you information about how to care for yourself after your procedure. Your health care provider may also give you more specific instructions. If you have problems or questions, contact your health care provider. What can I expect after the procedure? After the procedure, it is common to have:  A sore throat.  Mild stomach pain or discomfort.  Bloating.  Nausea. Follow these instructions at home:   Follow instructions from your health care provider  about what to eat or drink after your procedure.  Return to your normal activities as told by your health care provider. Ask your health care provider what activities are safe for you.  Take over-the-counter and prescription medicines only as told by your health care provider.  Do not drive for 24 hours if you were given a sedative during your procedure.  Keep all follow-up visits as told by your health care provider. This is important. Contact a health care provider if you have:  A sore throat that lasts longer than one day.  Trouble swallowing. Get help right away if:  You vomit blood or your vomit looks like coffee grounds.  You have: ? A fever. ? Bloody, black, or tarry stools. ? A severe sore throat or you cannot swallow. ? Difficulty breathing. ? Severe pain in your chest or abdomen. Summary  After the procedure, it is common to have a sore throat, mild stomach discomfort, bloating, and nausea.  Do not drive for 24 hours if you were given a sedative during the procedure.  Follow instructions from your health care provider about what to eat or drink after your procedure.  Return to your normal activities as told by your health care provider. This information is not intended to replace advice given to you by your health care provider. Make sure you discuss any questions you  have with your health care provider. Document Revised: 10/12/2017 Document Reviewed: 09/20/2017 Elsevier Patient Education  [place].  Colonoscopy, Adult, Care After This sheet gives you information about how to care for yourself after your procedure. Your health care provider may also give you more specific instructions. If you have problems or questions, contact your health care provider. What can I expect after the procedure? After the procedure, it is common to have:  A small amount of blood in your stool for 24 hours after the procedure.  Some gas.  Mild cramping or bloating of your  abdomen. Follow these instructions at home: Eating and drinking   Drink enough fluid to keep your urine pale yellow.  Follow instructions from your health care provider about eating or drinking restrictions.  Resume your normal diet as instructed by your health care provider. Avoid heavy or fried foods that are hard to digest. Activity  Rest as told by your health care provider.  Avoid sitting for a long time without moving. Get up to take short walks every 1-2 hours. This is important to improve blood flow and breathing. Ask for help if you feel weak or unsteady.  Return to your normal activities as told by your health care provider. Ask your health care provider what activities are safe for you. Managing cramping and bloating   Try walking around when you have cramps or feel bloated.  Apply heat to your abdomen as told by your health care provider. Use the heat source that your health care provider recommends, such as a moist heat pack or a heating pad. ? Place a towel between your skin and the heat source. ? Leave the heat on for 20-30 minutes. ? Remove the heat if your skin turns bright red. This is especially important if you are unable to feel pain, heat, or cold. You may have a greater risk of getting burned. General instructions  For the first 24 hours after the procedure: ? Do not drive or use machinery. ? Do not sign important documents. ? Do not drink alcohol. ? Do your regular daily activities at a slower pace than normal. ? Eat soft foods that are easy to digest.  Take over-the-counter and prescription medicines only as told by your health care provider.  Keep all follow-up visits as told by your health care provider. This is important. Contact a health care provider if:  You have blood in your stool 2-3 days after the procedure. Get help right away if you have:  More than a small spotting of blood in your stool.  Large blood clots in your stool.  Swelling  of your abdomen.  Nausea or vomiting.  A fever.  Increasing pain in your abdomen that is not relieved with medicine. Summary  After the procedure, it is common to have a small amount of blood in your stool. You may also have mild cramping and bloating of your abdomen.  For the first 24 hours after the procedure, do not drive or use machinery, sign important documents, or drink alcohol.  Get help right away if you have a lot of blood in your stool, nausea or vomiting, a fever, or increased pain in your abdomen. This information is not intended to replace advice given to you by your health care provider. Make sure you discuss any questions you have with your health care provider. Document Revised: 11/14/2018 Document Reviewed: 11/14/2018 Elsevier Patient Education  [place] After These instructions provide  you with information about caring for yourself after your procedure. Your health care provider may also give you more specific instructions. Your treatment has been planned according to current medical practices, but problems sometimes occur. Call your health care provider if you have any problems or questions after your procedure. What can I expect after the procedure? After your procedure, you may:  Feel sleepy for several hours.  Feel clumsy and have poor balance for several hours.  Feel forgetful about what happened after the procedure.  Have poor judgment for several hours.  Feel nauseous or vomit.  Have a sore throat if you had a breathing tube during the procedure. Follow these instructions at home: For at least 24 hours after the procedure:      Have a responsible adult stay with you. It is important to have someone help care for you until you are awake and alert.  Rest as needed.  Do not: ? Participate in activities in which you could fall or become injured. ? Drive. ? Use heavy machinery. ? Drink alcohol. ? Take  sleeping pills or medicines that cause drowsiness. ? Make important decisions or sign legal documents. ? Take care of children on your own. Eating and drinking  Follow the diet that is recommended by your health care provider.  If you vomit, drink water, juice, or soup when you can drink without vomiting.  Make sure you have little or no nausea before eating solid foods. General instructions  Take over-the-counter and prescription medicines only as told by your health care provider.  If you have sleep apnea, surgery and certain medicines can increase your risk for breathing problems. Follow instructions from your health care provider about wearing your sleep device: ? Anytime you are sleeping, including during daytime naps. ? While taking prescription pain medicines, sleeping medicines, or medicines that make you drowsy.  If you smoke, do not smoke without supervision.  Keep all follow-up visits as told by your health care provider. This is important. Contact a health care provider if:  You keep feeling nauseous or you keep vomiting.  You feel light-headed.  You develop a rash.  You have a fever. Get help right away if:  You have trouble breathing. Summary  For several hours after your procedure, you may feel sleepy and have poor judgment.  Have a responsible adult stay with you for at least 24 hours or until you are awake and alert. This information is not intended to replace advice given to you by your health care provider. Make sure you discuss any questions you have with your health care provider. Document Revised: 07/19/2017 Document Reviewed: 08/11/2015 Elsevier Patient Education  [place].

## 2019-10-04 NOTE — Progress Notes (Signed)
Patient taking calcium as directed.  Denied tooth, jaw, and leg pain.  No recent or upcoming dental visits.  Patient tolerated injection with no complaints.  Site clean and dry with no bruising or swelling noted at site.  Band aid applied.  VSS with discharge and left ambulatory with no s/s of distress noted. 

## 2019-10-05 MED ORDER — OCTREOTIDE ACETATE 30 MG IM KIT
PACK | INTRAMUSCULAR | Status: AC
  Filled 2019-10-05: qty 1

## 2019-10-09 ENCOUNTER — Encounter (HOSPITAL_COMMUNITY): Payer: Self-pay

## 2019-10-09 ENCOUNTER — Encounter (HOSPITAL_COMMUNITY)
Admission: RE | Admit: 2019-10-09 | Discharge: 2019-10-09 | Disposition: A | Discharge status: Home / Self Care | Payer: Medicare Other | Source: Ambulatory Visit | Attending: Internal Medicine | Admitting: Internal Medicine

## 2019-10-09 ENCOUNTER — Other Ambulatory Visit (HOSPITAL_COMMUNITY)
Admission: RE | Admit: 2019-10-09 | Discharge: 2019-10-09 | Disposition: A | Discharge status: Home / Self Care | Payer: Medicare Other | Source: Ambulatory Visit | Attending: Internal Medicine | Admitting: Internal Medicine

## 2019-10-09 ENCOUNTER — Other Ambulatory Visit: Payer: Self-pay

## 2019-10-09 DIAGNOSIS — K219 Gastro-esophageal reflux disease without esophagitis: Secondary | ICD-10-CM | POA: Insufficient documentation

## 2019-10-09 DIAGNOSIS — Z01812 Encounter for preprocedural laboratory examination: Secondary | ICD-10-CM | POA: Diagnosis not present

## 2019-10-09 DIAGNOSIS — J449 Chronic obstructive pulmonary disease, unspecified: Secondary | ICD-10-CM | POA: Insufficient documentation

## 2019-10-09 DIAGNOSIS — F17210 Nicotine dependence, cigarettes, uncomplicated: Secondary | ICD-10-CM | POA: Insufficient documentation

## 2019-10-09 DIAGNOSIS — Z20822 Contact with and (suspected) exposure to covid-19: Secondary | ICD-10-CM | POA: Insufficient documentation

## 2019-10-09 DIAGNOSIS — Z79899 Other long term (current) drug therapy: Secondary | ICD-10-CM | POA: Insufficient documentation

## 2019-10-09 DIAGNOSIS — I509 Heart failure, unspecified: Secondary | ICD-10-CM | POA: Diagnosis not present

## 2019-10-09 DIAGNOSIS — K7460 Unspecified cirrhosis of liver: Secondary | ICD-10-CM | POA: Insufficient documentation

## 2019-10-09 DIAGNOSIS — E785 Hyperlipidemia, unspecified: Secondary | ICD-10-CM | POA: Insufficient documentation

## 2019-10-09 LAB — CBC WITH DIFFERENTIAL/PLATELET
Abs Immature Granulocytes: 0.01 10*3/uL (ref 0.00–0.07)
Basophils Absolute: 0.1 10*3/uL (ref 0.0–0.1)
Basophils Relative: 1 %
Eosinophils Absolute: 0 10*3/uL (ref 0.0–0.5)
Eosinophils Relative: 1 %
HCT: 44.1 % (ref 36.0–46.0)
Hemoglobin: 14.8 g/dL (ref 12.0–15.0)
Immature Granulocytes: 0 %
Lymphocytes Relative: 58 %
Lymphs Abs: 3.3 10*3/uL (ref 0.7–4.0)
MCH: 31.4 pg (ref 26.0–34.0)
MCHC: 33.6 g/dL (ref 30.0–36.0)
MCV: 93.6 fL (ref 80.0–100.0)
Monocytes Absolute: 0.7 10*3/uL (ref 0.1–1.0)
Monocytes Relative: 12 %
Neutro Abs: 1.6 10*3/uL — ABNORMAL LOW (ref 1.7–7.7)
Neutrophils Relative %: 28 %
Platelets: 124 10*3/uL — ABNORMAL LOW (ref 150–400)
RBC: 4.71 MIL/uL (ref 3.87–5.11)
RDW: 12.7 % (ref 11.5–15.5)
WBC: 5.6 10*3/uL (ref 4.0–10.5)
nRBC: 0 % (ref 0.0–0.2)

## 2019-10-09 LAB — COMPREHENSIVE METABOLIC PANEL
ALT: 9 U/L (ref 0–44)
AST: 17 U/L (ref 15–41)
Albumin: 4.1 g/dL (ref 3.5–5.0)
Alkaline Phosphatase: 38 U/L (ref 38–126)
Anion gap: 8 (ref 5–15)
BUN: 17 mg/dL (ref 8–23)
CO2: 26 mmol/L (ref 22–32)
Calcium: 9 mg/dL (ref 8.9–10.3)
Chloride: 104 mmol/L (ref 98–111)
Creatinine, Ser: 0.71 mg/dL (ref 0.44–1.00)
GFR calc Af Amer: >60 mL/min (ref >60)
GFR calc non Af Amer: >60 mL/min (ref >60)
Glucose, Bld: 117 mg/dL — ABNORMAL HIGH (ref 70–99)
Potassium: 4.6 mmol/L (ref 3.5–5.1)
Sodium: 138 mmol/L (ref 135–145)
Total Bilirubin: 0.8 mg/dL (ref 0.3–1.2)
Total Protein: 6.8 g/dL (ref 6.5–8.1)

## 2019-10-09 LAB — PROTIME-INR
INR: 1.1 (ref 0.8–1.2)
Prothrombin Time: 13.9 seconds (ref 11.4–15.2)

## 2019-10-09 LAB — SARS CORONAVIRUS 2 (TAT 6-24 HRS): SARS Coronavirus 2: NEGATIVE

## 2019-10-12 ENCOUNTER — Encounter (HOSPITAL_COMMUNITY): Payer: Self-pay | Admitting: Internal Medicine

## 2019-10-12 ENCOUNTER — Ambulatory Visit (HOSPITAL_COMMUNITY)
Admission: RE | Admit: 2019-10-12 | Discharge: 2019-10-12 | Disposition: A | Discharge status: Home / Self Care | Payer: Medicare Other | Attending: Internal Medicine | Admitting: Internal Medicine

## 2019-10-12 ENCOUNTER — Ambulatory Visit (HOSPITAL_COMMUNITY): Payer: Medicare Other | Admitting: Anesthesiology

## 2019-10-12 ENCOUNTER — Encounter (HOSPITAL_COMMUNITY)
Admission: RE | Disposition: A | Discharge status: Home / Self Care | Payer: Self-pay | Source: Home / Self Care | Attending: Internal Medicine

## 2019-10-12 ENCOUNTER — Other Ambulatory Visit: Payer: Self-pay

## 2019-10-12 DIAGNOSIS — K259 Gastric ulcer, unspecified as acute or chronic, without hemorrhage or perforation: Secondary | ICD-10-CM | POA: Diagnosis not present

## 2019-10-12 DIAGNOSIS — Z8619 Personal history of other infectious and parasitic diseases: Secondary | ICD-10-CM | POA: Insufficient documentation

## 2019-10-12 DIAGNOSIS — Z13810 Encounter for screening for upper gastrointestinal disorder: Secondary | ICD-10-CM | POA: Diagnosis present

## 2019-10-12 DIAGNOSIS — F319 Bipolar disorder, unspecified: Secondary | ICD-10-CM | POA: Insufficient documentation

## 2019-10-12 DIAGNOSIS — D122 Benign neoplasm of ascending colon: Secondary | ICD-10-CM | POA: Insufficient documentation

## 2019-10-12 DIAGNOSIS — Z79811 Long term (current) use of aromatase inhibitors: Secondary | ICD-10-CM | POA: Diagnosis not present

## 2019-10-12 DIAGNOSIS — Z905 Acquired absence of kidney: Secondary | ICD-10-CM | POA: Insufficient documentation

## 2019-10-12 DIAGNOSIS — J9611 Chronic respiratory failure with hypoxia: Secondary | ICD-10-CM | POA: Diagnosis not present

## 2019-10-12 DIAGNOSIS — F17210 Nicotine dependence, cigarettes, uncomplicated: Secondary | ICD-10-CM | POA: Insufficient documentation

## 2019-10-12 DIAGNOSIS — Z86010 Personal history of colonic polyps: Secondary | ICD-10-CM | POA: Insufficient documentation

## 2019-10-12 DIAGNOSIS — K5730 Diverticulosis of large intestine without perforation or abscess without bleeding: Secondary | ICD-10-CM | POA: Diagnosis not present

## 2019-10-12 DIAGNOSIS — K219 Gastro-esophageal reflux disease without esophagitis: Secondary | ICD-10-CM | POA: Diagnosis not present

## 2019-10-12 DIAGNOSIS — J449 Chronic obstructive pulmonary disease, unspecified: Secondary | ICD-10-CM | POA: Insufficient documentation

## 2019-10-12 DIAGNOSIS — D123 Benign neoplasm of transverse colon: Secondary | ICD-10-CM | POA: Diagnosis not present

## 2019-10-12 DIAGNOSIS — Z79899 Other long term (current) drug therapy: Secondary | ICD-10-CM | POA: Insufficient documentation

## 2019-10-12 DIAGNOSIS — I509 Heart failure, unspecified: Secondary | ICD-10-CM | POA: Insufficient documentation

## 2019-10-12 DIAGNOSIS — K7460 Unspecified cirrhosis of liver: Secondary | ICD-10-CM | POA: Insufficient documentation

## 2019-10-12 DIAGNOSIS — Z1211 Encounter for screening for malignant neoplasm of colon: Secondary | ICD-10-CM | POA: Diagnosis not present

## 2019-10-12 DIAGNOSIS — K3189 Other diseases of stomach and duodenum: Secondary | ICD-10-CM | POA: Diagnosis not present

## 2019-10-12 DIAGNOSIS — G250 Essential tremor: Secondary | ICD-10-CM | POA: Insufficient documentation

## 2019-10-12 DIAGNOSIS — Z800 Family history of malignant neoplasm of digestive organs: Secondary | ICD-10-CM | POA: Insufficient documentation

## 2019-10-12 DIAGNOSIS — Z9012 Acquired absence of left breast and nipple: Secondary | ICD-10-CM | POA: Insufficient documentation

## 2019-10-12 DIAGNOSIS — E039 Hypothyroidism, unspecified: Secondary | ICD-10-CM | POA: Insufficient documentation

## 2019-10-12 DIAGNOSIS — C50912 Malignant neoplasm of unspecified site of left female breast: Secondary | ICD-10-CM | POA: Diagnosis not present

## 2019-10-12 DIAGNOSIS — N289 Disorder of kidney and ureter, unspecified: Secondary | ICD-10-CM | POA: Insufficient documentation

## 2019-10-12 DIAGNOSIS — E785 Hyperlipidemia, unspecified: Secondary | ICD-10-CM | POA: Insufficient documentation

## 2019-10-12 DIAGNOSIS — Z79890 Hormone replacement therapy (postmenopausal): Secondary | ICD-10-CM | POA: Insufficient documentation

## 2019-10-12 DIAGNOSIS — K766 Portal hypertension: Secondary | ICD-10-CM | POA: Diagnosis not present

## 2019-10-12 HISTORY — PX: ESOPHAGOGASTRODUODENOSCOPY (EGD) WITH PROPOFOL: SHX5813

## 2019-10-12 HISTORY — PX: POLYPECTOMY: SHX5525

## 2019-10-12 HISTORY — PX: COLONOSCOPY WITH PROPOFOL: SHX5780

## 2019-10-12 HISTORY — PX: BIOPSY: SHX5522

## 2019-10-12 SURGERY — COLONOSCOPY WITH PROPOFOL
Anesthesia: General

## 2019-10-12 MED ORDER — IPRATROPIUM-ALBUTEROL 0.5-2.5 (3) MG/3ML IN SOLN
3.0000 mL | Freq: Once | RESPIRATORY_TRACT | Status: AC
  Administered 2019-10-12: 3 mL via RESPIRATORY_TRACT

## 2019-10-12 MED ORDER — PROPOFOL 500 MG/50ML IV EMUL
INTRAVENOUS | Status: DC | PRN
  Administered 2019-10-12 (×2): 50 mg via INTRAVENOUS
  Administered 2019-10-12: 40 mg via INTRAVENOUS
  Administered 2019-10-12: 50 mg via INTRAVENOUS
  Administered 2019-10-12: 30 mg via INTRAVENOUS
  Administered 2019-10-12: 100 mg via INTRAVENOUS
  Administered 2019-10-12 (×2): 40 mg via INTRAVENOUS
  Administered 2019-10-12: 30 mg via INTRAVENOUS

## 2019-10-12 MED ORDER — LIDOCAINE VISCOUS HCL 2 % MT SOLN
15.0000 mL | Freq: Once | OROMUCOSAL | Status: AC
  Administered 2019-10-12: 15 mL via OROMUCOSAL

## 2019-10-12 MED ORDER — IPRATROPIUM-ALBUTEROL 0.5-2.5 (3) MG/3ML IN SOLN: 3.0000 mL | Freq: Once | RESPIRATORY_TRACT | Status: DC

## 2019-10-12 MED ORDER — LIDOCAINE 2% (20 MG/ML) 5 ML SYRINGE
INTRAMUSCULAR | Status: AC
  Filled 2019-10-12: qty 20

## 2019-10-12 MED ORDER — LIDOCAINE HCL (CARDIAC) PF 50 MG/5ML IV SOSY
PREFILLED_SYRINGE | INTRAVENOUS | Status: DC | PRN
  Administered 2019-10-12: 100 mg via INTRAVENOUS

## 2019-10-12 MED ORDER — CHLORHEXIDINE GLUCONATE CLOTH 2 % EX PADS: 6.0000 | MEDICATED_PAD | Freq: Once | CUTANEOUS | Status: DC

## 2019-10-12 MED ORDER — SCOPOLAMINE 1 MG/3DAYS TD PT72
MEDICATED_PATCH | TRANSDERMAL | Status: AC
  Filled 2019-10-12: qty 1

## 2019-10-12 MED ORDER — LIDOCAINE VISCOUS HCL 2 % MT SOLN
OROMUCOSAL | Status: AC
  Filled 2019-10-12: qty 15

## 2019-10-12 MED ORDER — PROPOFOL 10 MG/ML IV BOLUS
INTRAVENOUS | Status: AC
  Filled 2019-10-12: qty 160

## 2019-10-12 MED ORDER — GLYCOPYRROLATE 0.2 MG/ML IJ SOLN
0.2000 mg | Freq: Once | INTRAMUSCULAR | Status: AC
  Administered 2019-10-12: 0.2 mg via INTRAVENOUS
  Filled 2019-10-12: qty 1

## 2019-10-12 MED ORDER — IPRATROPIUM-ALBUTEROL 0.5-2.5 (3) MG/3ML IN SOLN
RESPIRATORY_TRACT | Status: AC
  Filled 2019-10-12: qty 3

## 2019-10-12 MED ORDER — LACTATED RINGERS IV SOLN
Freq: Once | INTRAVENOUS | Status: AC
  Administered 2019-10-12: 1000 mL via INTRAVENOUS

## 2019-10-12 MED ORDER — LACTATED RINGERS IV SOLN: INTRAVENOUS | Status: DC | PRN

## 2019-10-12 NOTE — Anesthesia Preprocedure Evaluation (Addendum)
Anesthesia Evaluation  Patient identified by MRN, date of birth, ID band Patient awake    Reviewed: Allergy & Precautions, NPO status , Patient's Chart, lab work & pertinent test results  History of Anesthesia Complications (+) Family history of anesthesia reaction and history of anesthetic complications (postop on vent after cyto/stent -09/07/17)  Airway Mallampati: II  TM Distance: >3 FB Neck ROM: Full    Dental  (+) Edentulous Upper, Edentulous Lower   Pulmonary shortness of breath and with exertion, pneumonia, resolved, COPD,  COPD inhaler, Current Smoker and Patient abstained from smoking.,    Pulmonary exam normal breath sounds clear to auscultation + decreased breath sounds      Cardiovascular Exercise Tolerance: Poor +CHF  Normal cardiovascular exam+ dysrhythmias  Rhythm:Regular Rate:Normal  Left ventricle: The cavity size was normal. There was mild  concentric hypertrophy. Systolic function was normal. The  estimated ejection fraction was in the range of 60% to 65%. Wall  motion was normal; there were no regional wall motion  abnormalities. Doppler parameters are consistent with abnormal  left ventricular relaxation (grade 1 diastolic dysfunction).  Doppler parameters are consistent with elevated ventricular  end-diastolic filling pressure.  - Aortic valve: There was no regurgitation.  - Right ventricle: The cavity size was normal. Wall thickness was  normal. Systolic function was normal.  - Right atrium: The atrium was normal in size.  - Tricuspid valve: There was no regurgitation.  - Pulmonary arteries: Systolic pressure was within the normal  range.  - Inferior vena cava: The vessel was normal in size.  - Pericardium, extracardiac: A trivial pericardial effusion was identified.   EKG - SR, LAFB, T wave abnormalities   Neuro/Psych PSYCHIATRIC DISORDERS Anxiety Bipolar Disorder  Neuromuscular  disease    GI/Hepatic GERD  Medicated,(+) Cirrhosis       ,   Endo/Other  Hypothyroidism   Renal/GU Renal disease (nephrectomy)     Musculoskeletal negative musculoskeletal ROS (+)   Abdominal   Peds  Hematology  (+) anemia ,   Anesthesia Other Findings Left breast cancer Bilateral eye congestion?  Reproductive/Obstetrics negative OB ROS                           Anesthesia Physical Anesthesia Plan  ASA: IV  Anesthesia Plan: General   Post-op Pain Management:    Induction: Intravenous  PONV Risk Score and Plan: 2 and TIVA  Airway Management Planned: Nasal Cannula, Natural Airway and Simple Face Mask  Additional Equipment:   Intra-op Plan:   Post-operative Plan: Possible Post-op intubation/ventilation  Informed Consent: I have reviewed the patients History and Physical, chart, labs and discussed the procedure including the risks, benefits and alternatives for the proposed anesthesia with the patient or authorized representative who has indicated his/her understanding and acceptance.       Plan Discussed with: CRNA  Anesthesia Plan Comments: (Will give duoneb treatment before the procedure)       Anesthesia Quick Evaluation

## 2019-10-12 NOTE — Transfer of Care (Signed)
Immediate Anesthesia Transfer of Care Note  Patient: [name]  Procedure(s) Performed: COLONOSCOPY WITH PROPOFOL (N/A ) ESOPHAGOGASTRODUODENOSCOPY (EGD) WITH PROPOFOL (N/A ) BIOPSY POLYPECTOMY  Patient Location: PACU  Anesthesia Type:MAC  Level of Consciousness: awake, alert , oriented and patient cooperative  Airway & Oxygen Therapy: Patient Spontanous Breathing  Post-op Assessment: Report given to RN, Post -op Vital signs reviewed and stable and Patient moving all extremities  Post vital signs: Reviewed and stable  Last Vitals:  Vitals Value Taken Time  BP 166/90 10/12/19 0815  Temp    Pulse 102 10/12/19 0815  Resp 32 10/12/19 0815  SpO2 95 % 10/12/19 0815  Vitals shown include unvalidated device data.  Last Pain:  Vitals:   10/12/19 0732  TempSrc:   PainSc: 0-No pain      Patients Stated Pain Goal: 8 (01/60/10 9323)  Complications: No complications documented.

## 2019-10-12 NOTE — Op Note (Signed)
[facility] Patient Name: Laurie Moreno Procedure Date: 10/12/2019 7:43 AM MRN: [id number] Date of Birth: 08-20-1950 Attending MD: Norvel Richards , MD CSN: [id number] Age: 69 Admit Type: Outpatient Procedure:                Colonoscopy Indications:              High risk colon cancer surveillance: Personal                            history of colonic polyps Providers:                Norvel Richards, MD, Janeece Riggers, RN, Crystal                            Page, Aram Candela Referring MD:              Medicines:                Propofol per Anesthesia Complications:            No immediate complications. Estimated Blood Loss:     Estimated blood loss was minimal. Estimated blood                            loss was minimal. Procedure:                Pre-Anesthesia Assessment:                           - Prior to the procedure, a History and Physical                            was performed, and patient medications and                            allergies were reviewed. The patient's tolerance of                            previous anesthesia was also reviewed. The risks                            and benefits of the procedure and the sedation                            options and risks were discussed with the patient.                            All questions were answered, and informed consent                            was obtained. Prior Anticoagulants: The patient has                            taken no previous anticoagulant or antiplatelet                            agents.  ASA Grade Assessment: III - A patient with                            severe systemic disease. After reviewing the risks                            and benefits, the patient was deemed in                            satisfactory condition to undergo the procedure.                           After obtaining informed consent, the colonoscope                            was passed under direct vision.  Throughout the                            procedure, the patient's blood pressure, pulse, and                            oxygen saturations were monitored continuously. The                            CF-[id number] ([id number]) scope was introduced through                            the anus and advanced to the the cecum, identified                            by appendiceal orifice and ileocecal valve. The                            colonoscopy was performed without difficulty. The                            patient tolerated the procedure well. The quality                            of the bowel preparation was adequate. Scope In: 7:01:77 AM Scope Out: 8:10:39 AM Scope Withdrawal Time: 0 hours 10 minutes 32 seconds  Total Procedure Duration: 0 hours 23 minutes 53 seconds  Findings:      The perianal and digital rectal examinations 1.5 cm verrucous appearing       raised nodule upper end of one of her gluteal folds.      Scattered medium-mouthed diverticula were found in the entire colon.      Three sessile polyps were found in the splenic flexure and ascending       colon. The polyps were 5 to 6 mm in size. These polyps were removed with       a cold snare. Resection and retrieval were complete. Estimated blood       loss was minimal.      The exam was otherwise without abnormality on direct and retroflexion  views. Impression:               - Diverticulosis in the entire examined colon.                           - Three 5 to 6 mm polyps at the splenic flexure and                            in the ascending colon, removed with a cold snare.                            Resected and retrieved.                           - The examination was otherwise normal on direct                            and retroflexion views. Moderate Sedation:      Moderate (conscious) sedation was personally administered by an       anesthesia professional. The following parameters were monitored: oxygen        saturation, heart rate, blood pressure, respiratory rate, EKG, adequacy       of pulmonary ventilation, and response to care. Recommendation:           - Patient has a contact number available for                            emergencies. The signs and symptoms of potential                            delayed complications were discussed with the                            patient. Return to normal activities tomorrow.                            Written discharge instructions were provided to the                            patient.                           - Resume previous diet.                           - Continue present medications.                           - Repeat colonoscopy date to be determined after                            pending pathology results are reviewed for                            surveillance.                           -  Return to GI office in 6 weeks. See dermatologist                            in reference to gluteal lesion. Procedure Code(s):        --- Professional ---                           [id number], Colonoscopy, flexible; with removal of                            tumor(s), polyp(s), or other lesion(s) by snare                            technique Diagnosis Code(s):        --- Professional ---                           Z86.010, Personal history of colonic polyps                           K63.5, Polyp of colon                           K57.30, Diverticulosis of large intestine without                            perforation or abscess without bleeding CPT copyright 2019 [employer]. All rights reserved. The codes documented in this report are preliminary and upon coder review may  be revised to meet current compliance requirements. Cristopher Estimable. Himmat Enberg, MD Norvel Richards, MD 10/12/2019 8:20:45 AM This report has been signed electronically. Number of Addenda: 0

## 2019-10-12 NOTE — Discharge Instructions (Signed)
[facility] Discharge Instructions  Read the instructions outlined below and refer to this sheet in the next few weeks. These discharge instructions provide you with general information on caring for yourself after you leave the hospital. Your doctor may also give you specific instructions. While your treatment has been planned according to the most current medical practices available, unavoidable complications occasionally occur. If you have any problems or questions after discharge, call Dr. Gala Romney at [phone]. ACTIVITY  You may resume your regular activity, but move at a slower pace for the next 24 hours.   Take frequent rest periods for the next 24 hours.   Walking will help get rid of the air and reduce the bloated feeling in your belly (abdomen).   No driving for 24 hours (because of the medicine (anesthesia) used during the test).    Do not sign any important legal documents or operate any machinery for 24 hours (because of the anesthesia used during the test).  NUTRITION  Drink plenty of fluids.   You may resume your normal diet as instructed by your doctor.   Begin with a light meal and progress to your normal diet. Heavy or fried foods are harder to digest and may make you feel sick to your stomach (nauseated).   Avoid alcoholic beverages for 24 hours or as instructed.  MEDICATIONS  You may resume your normal medications unless your doctor tells you otherwise.  WHAT YOU CAN EXPECT TODAY  Some feelings of bloating in the abdomen.   Passage of more gas than usual.   Spotting of blood in your stool or on the toilet paper.  IF YOU HAD POLYPS REMOVED DURING THE COLONOSCOPY:  No aspirin products for 7 days or as instructed.   No alcohol for 7 days or as instructed.   Eat a soft diet for the next 24 hours.  FINDING OUT THE RESULTS OF YOUR TEST Not all test results are available during your visit. If your test results are not back during the visit, make an appointment  with your caregiver to find out the results. Do not assume everything is normal if you have not heard from your caregiver or the medical facility. It is important for you to follow up on all of your test results.  SEEK IMMEDIATE MEDICAL ATTENTION IF:  You have more than a spotting of blood in your stool.   Your belly is swollen (abdominal distention).   You are nauseated or vomiting.   You have a temperature over 101.   You have abdominal pain or discomfort that is severe or gets worse throughout the day.    EGD Discharge instructions Please read the instructions outlined below and refer to this sheet in the next few weeks. These discharge instructions provide you with general information on caring for yourself after you leave the hospital. Your doctor may also give you specific instructions. While your treatment has been planned according to the most current medical practices available, unavoidable complications occasionally occur. If you have any problems or questions after discharge, please call your doctor. ACTIVITY  You may resume your regular activity but move at a slower pace for the next 24 hours.   Take frequent rest periods for the next 24 hours.   Walking will help expel (get rid of) the air and reduce the bloated feeling in your abdomen.   No driving for 24 hours (because of the anesthesia (medicine) used during the test).   You may shower.   Do not  sign any important legal documents or operate any machinery for 24 hours (because of the anesthesia used during the test).  NUTRITION  Drink plenty of fluids.   You may resume your normal diet.   Begin with a light meal and progress to your normal diet.   Avoid alcoholic beverages for 24 hours or as instructed by your caregiver.  MEDICATIONS  You may resume your normal medications unless your caregiver tells you otherwise.  WHAT YOU CAN EXPECT TODAY  You may experience abdominal discomfort such as a feeling of  fullness or "gas" pains.  FOLLOW-UP  Your doctor will discuss the results of your test with you.  SEEK IMMEDIATE MEDICAL ATTENTION IF ANY OF THE FOLLOWING OCCUR:  Excessive nausea (feeling sick to your stomach) and/or vomiting.   Severe abdominal pain and distention (swelling).   Trouble swallowing.   Temperature over 101 F (37.8 C).   Rectal bleeding or vomiting of blood.   Your stomach was inflamed.  Biopsies taken.  3 polyps removed from your colon  Colon polyp and diverticulosis information provided  You have a nodule on your buttock that needs to be taken care of by the dermatologist.  Office visit with [place] in 6 weeks  At patient request, I called Fabio Bering at [phone]  -  No answer    Colon Polyps  Polyps are tissue growths inside the body. Polyps can grow in many places, including the large intestine (colon). A polyp may be a round bump or a mushroom-shaped growth. You could have one polyp or several. Most colon polyps are noncancerous (benign). However, some colon polyps can become cancerous over time. Finding and removing the polyps early can help prevent this. What are the causes? The exact cause of colon polyps is not known. What increases the risk? You are more likely to develop this condition if you:  Have a family history of colon cancer or colon polyps.  Are older than 79 or older than 45 if you are African American.  Have inflammatory bowel disease, such as ulcerative colitis or Crohn's disease.  Have certain hereditary conditions, such as: ? Familial adenomatous polyposis. ? Lynch syndrome. ? Turcot syndrome. ? Peutz-Jeghers syndrome.  Are overweight.  Smoke cigarettes.  Do not get enough exercise.  Drink too much alcohol.  Eat a diet that is high in fat and red meat and low in fiber.  Had childhood cancer that was treated with abdominal radiation. What are the signs or symptoms? Most polyps do not cause symptoms. If you  have symptoms, they may include:  Blood coming from your rectum when having a bowel movement.  Blood in your stool. The stool may look dark red or black.  Abdominal pain.  A change in bowel habits, such as constipation or diarrhea. How is this diagnosed? This condition is diagnosed with a colonoscopy. This is a procedure in which a lighted, flexible scope is inserted into the anus and then passed into the colon to examine the area. Polyps are sometimes found when a colonoscopy is done as part of routine cancer screening tests. How is this treated? Treatment for this condition involves removing any polyps that are found. Most polyps can be removed during a colonoscopy. Those polyps will then be tested for cancer. Additional treatment may be needed depending on the results of testing. Follow these instructions at home: Lifestyle  Maintain a healthy weight, or lose weight if recommended by your health care provider.  Exercise every day or as told  by your health care provider.  Do not use any products that contain nicotine or tobacco, such as cigarettes and e-cigarettes. If you need help quitting, ask your health care provider.  If you drink alcohol, limit how much you have: ? 0-1 drink a day for women. ? 0-2 drinks a day for men.  Be aware of how much alcohol is in your drink. In the [place] one drink equals one 12 oz bottle of beer (355 mL), one 5 oz glass of wine (148 mL), or one 1 oz shot of hard liquor (44 mL). Eating and drinking   Eat foods that are high in fiber, such as fruits, vegetables, and whole grains.  Eat foods that are high in calcium and vitamin D, such as milk, cheese, yogurt, eggs, liver, fish, and broccoli.  Limit foods that are high in fat, such as fried foods and desserts.  Limit the amount of red meat and processed meat you eat, such as hot dogs, sausage, bacon, and lunch meats. General instructions  Keep all follow-up visits as told by your health care  provider. This is important. ? This includes having regularly scheduled colonoscopies. ? Talk to your health care provider about when you need a colonoscopy. Contact a health care provider if:  You have new or worsening bleeding during a bowel movement.  You have new or increased blood in your stool.  You have a change in bowel habits.  You lose weight for no known reason. Summary  Polyps are tissue growths inside the body. Polyps can grow in many places, including the colon.  Most colon polyps are noncancerous (benign), but some can become cancerous over time.  This condition is diagnosed with a colonoscopy.  Treatment for this condition involves removing any polyps that are found. Most polyps can be removed during a colonoscopy. This information is not intended to replace advice given to you by your health care provider. Make sure you discuss any questions you have with your health care provider. Document Revised: 08/05/2017 Document Reviewed: 08/05/2017 Elsevier Patient Education  [place].     Diverticulosis  Diverticulosis is a condition that develops when small pouches (diverticula) form in the wall of the large intestine (colon). The colon is where water is absorbed and stool (feces) is formed. The pouches form when the inside layer of the colon pushes through weak spots in the outer layers of the colon. You may have a few pouches or many of them. The pouches usually do not cause problems unless they become inflamed or infected. When this happens, the condition is called diverticulitis. What are the causes? The cause of this condition is not known. What increases the risk? The following factors may make you more likely to develop this condition:  Being older than age 47. Your risk for this condition increases with age. Diverticulosis is rare among people younger than age 3. By age 56, many people have it.  Eating a low-fiber diet.  Having frequent  constipation.  Being overweight.  Not getting enough exercise.  Smoking.  Taking over-the-counter pain medicines, like aspirin and ibuprofen.  Having a family history of diverticulosis. What are the signs or symptoms? In most people, there are no symptoms of this condition. If you do have symptoms, they may include:  Bloating.  Cramps in the abdomen.  Constipation or diarrhea.  Pain in the lower left side of the abdomen. How is this diagnosed? Because diverticulosis usually has no symptoms, it is most often diagnosed during  an exam for other colon problems. The condition may be diagnosed by:  Using a flexible scope to examine the colon (colonoscopy).  Taking an X-ray of the colon after dye has been put into the colon (barium enema).  Having a CT scan. How is this treated? You may not need treatment for this condition. Your health care provider may recommend treatment to prevent problems. You may need treatment if you have symptoms or if you previously had diverticulitis. Treatment may include:  Eating a high-fiber diet.  Taking a fiber supplement.  Taking a live bacteria supplement (probiotic).  Taking medicine to relax your colon. Follow these instructions at home: Medicines  Take over-the-counter and prescription medicines only as told by your health care provider.  If told by your health care provider, take a fiber supplement or probiotic. Constipation prevention Your condition may cause constipation. To prevent or treat constipation, you may need to:  Drink enough fluid to keep your urine pale yellow.  Take over-the-counter or prescription medicines.  Eat foods that are high in fiber, such as beans, whole grains, and fresh fruits and vegetables.  Limit foods that are high in fat and processed sugars, such as fried or sweet foods.  General instructions  Try not to strain when you have a bowel movement.  Keep all follow-up visits as told by your health  care provider. This is important. Contact a health care provider if you:  Have pain in your abdomen.  Have bloating.  Have cramps.  Have not had a bowel movement in 3 days. Get help right away if:  Your pain gets worse.  Your bloating becomes very bad.  You have a fever or chills, and your symptoms suddenly get worse.  You vomit.  You have bowel movements that are bloody or black.  You have bleeding from your rectum. Summary  Diverticulosis is a condition that develops when small pouches (diverticula) form in the wall of the large intestine (colon).  You may have a few pouches or many of them.  This condition is most often diagnosed during an exam for other colon problems.  Treatment may include increasing the fiber in your diet, taking supplements, or taking medicines. This information is not intended to replace advice given to you by your health care provider. Make sure you discuss any questions you have with your health care provider. Document Revised: 11/17/2018 Document Reviewed: 11/17/2018 Elsevier Patient Education  [place] After These instructions provide you with information about caring for yourself after your procedure. Your health care provider may also give you more specific instructions. Your treatment has been planned according to current medical practices, but problems sometimes occur. Call your health care provider if you have any problems or questions after your procedure. What can I expect after the procedure? After your procedure, you may:  Feel sleepy for several hours.  Feel clumsy and have poor balance for several hours.  Feel forgetful about what happened after the procedure.  Have poor judgment for several hours.  Feel nauseous or vomit.  Have a sore throat if you had a breathing tube during the procedure. Follow these instructions at home: For at least 24 hours after the procedure:       Have a responsible adult stay with you. It is important to have someone help care for you until you are awake and alert.  Rest as needed.  Do not: ? Participate in activities in which you could  fall or become injured. ? Drive. ? Use heavy machinery. ? Drink alcohol. ? Take sleeping pills or medicines that cause drowsiness. ? Make important decisions or sign legal documents. ? Take care of children on your own. Eating and drinking  Follow the diet that is recommended by your health care provider.  If you vomit, drink water, juice, or soup when you can drink without vomiting.  Make sure you have little or no nausea before eating solid foods. General instructions  Take over-the-counter and prescription medicines only as told by your health care provider.  If you have sleep apnea, surgery and certain medicines can increase your risk for breathing problems. Follow instructions from your health care provider about wearing your sleep device: ? Anytime you are sleeping, including during daytime naps. ? While taking prescription pain medicines, sleeping medicines, or medicines that make you drowsy.  If you smoke, do not smoke without supervision.  Keep all follow-up visits as told by your health care provider. This is important. Contact a health care provider if:  You keep feeling nauseous or you keep vomiting.  You feel light-headed.  You develop a rash.  You have a fever. Get help right away if:  You have trouble breathing. Summary  For several hours after your procedure, you may feel sleepy and have poor judgment.  Have a responsible adult stay with you for at least 24 hours or until you are awake and alert. This information is not intended to replace advice given to you by your health care provider. Make sure you discuss any questions you have with your health care provider. Document Revised: 07/19/2017 Document Reviewed: 08/11/2015 Elsevier Patient Education  [place].

## 2019-10-12 NOTE — H&P (Signed)
@LOGO @   Primary Care Physician:  Zhou-Talbert, [name], MD Primary Gastroenterologist:  Dr. Gala Romney  Pre-Procedure History & Physical: HPI:  Laurie Moreno is a 69 y.o. female here for screening EGD given new diagnosis of cirrhosis.  Also surveillance colonoscopy history of colonic adenoma and positive family history of colon cancer.  Past Medical History:  Diagnosis Date   Acute pyelonephritis    Anxiety    Bipolar disorder, unspecified ([place])    From [place] form   CHF (congestive heart failure) ([place])    see [facility] 06-25-16 epic care everywhere ; "sister reports that patient has a 'weak heart'  but they did testing and she hasnt had to go back since"   Chronic respiratory failure with hypoxia ([place])    Complication of anesthesia    reports with last surgery on 09-07-17 it took 3 hours for her to wake up and she was on the ventilator post operatively for an additional  8 hours    COPD (chronic obstructive pulmonary disease) ([place])    Dermatitis    Dermatitis    Dysrhythmia    Elevated temperature 10/26/2017   temp of 99.9 , denies cold/flu sx; reports she gets frwquent UTIs, patient sister accompanying today  reports she is going to Dr Gloriann Loan (surgeon) office today  for pre-op appt  at 2pm   Essential tremor    Family history of adverse reaction to anesthesia    sister gets nausea and vomiting    Fever    History of ESBL E. coli infection    10-26-17 this RN did not enter this ESBL inf hx; patient and sister deny hx of ESBL infection and there is no supporting lab work for ESBL infection in epic    Hyperlipidemia    Hypothyroidism    Thrombocytopenia ([place])    Unspecified hydronephrosis    Urinary incontinence     Past Surgical History:  Procedure Laterality Date   CARDIAC CATHETERIZATION  07/15/2016   at [facility]   COLONOSCOPY  12/2013   Dr. Aaron Edelman C. Lewis at [facility], long tortuous colon, nonbleeding external hemorrhoids,  questionable small subcentimeter benign/hyperplastic polyp in the ascending colon, not removed.  Advised to have repeat colonoscopy in 4 years.   CYSTOSCOPY W/ URETERAL STENT PLACEMENT Left 09/07/2017   Procedure: CYSTOSCOPY WITH RETROGRADE PYELOGRAM/URETERAL STENT PLACEMENT;  Surgeon: Lucas Mallow, MD;  Location: [facility];  Service: Urology;  Laterality: Left;   EYE SURGERY  5 years ago    bilateral cataract extraction    eyelid lift Bilateral    LAPAROSCOPIC NEPHRECTOMY, HAND ASSISTED Left 11/01/2017   Procedure: LEFT HAND ASSISTED LAPAROSCOPIC NEPHRECTOMY;  Surgeon: Lucas Mallow, MD;  Location: [facility];  Service: Urology;  Laterality: Left;   MASTECTOMY MODIFIED RADICAL Left 01/23/2019   Procedure: LEFT MODIFIED RADICAL MASTECTOMY;  Surgeon: Aviva Signs, MD;  Location: [facility];  Service: General;  Laterality: Left;    Prior to Admission medications   Medication Sig Start Date End Date Taking? Authorizing Provider  albuterol (VENTOLIN HFA) 108 (90 Base) MCG/ACT inhaler Inhale 2 puffs into the lungs every 6 (six) hours as needed for wheezing or shortness of breath.   Yes [provider]  anastrozole (ARIMIDEX) 1 MG tablet Take 1 tablet (1 mg total) by mouth daily. 02/23/19  Yes Derek Jack, MD  aspirin 81 MG chewable tablet Chew 81 mg by mouth every morning.    Yes [provider]  atorvastatin (LIPITOR)  40 MG tablet Take 40 mg by mouth daily.    Yes [provider]  Calcium-Magnesium-Vitamin D (CALCIUM 1200+D3 PO) Take 1 tablet by mouth in the morning and at bedtime.   Yes [provider]  carbidopa-levodopa (SINEMET IR) 25-100 MG tablet Take 1 tablet by mouth 3 (three) times daily.  10/04/18 10/04/19 Yes [provider]  carvedilol (COREG) 3.125 MG tablet Take 3.125 mg by mouth 2 (two) times daily with a meal.   Yes [provider]  clonazePAM (KLONOPIN) 0.5 MG tablet Take 1 tablet (0.5 mg total) by mouth 2 (two) times  daily. Patient taking differently: Take 0.5 mg by mouth at bedtime.  09/17/17  Yes Lassen, Arlo C, PA-C  divalproex (DEPAKOTE) 250 MG DR tablet Take 250 mg by mouth 3 (three) times daily. Do not crush   Yes [provider]  escitalopram (LEXAPRO) 20 MG tablet Take 20 mg by mouth daily. 12/20/18  Yes [provider]  gabapentin (NEURONTIN) 300 MG capsule Take 300 mg by mouth 4 (four) times daily.   Yes [provider]  hydrOXYzine (ATARAX/VISTARIL) 25 MG tablet Take 25 mg by mouth every 8 (eight) hours as needed for itching.    Yes [provider]  levothyroxine (SYNTHROID, LEVOTHROID) 125 MCG tablet Take 125 mcg by mouth daily before breakfast.   Yes [provider]  Multiple Vitamin (MULTIVITAMIN) tablet Take 1 tablet by mouth every morning.    Yes [provider]  pantoprazole (PROTONIX) 40 MG tablet Take 1 tablet (40 mg total) by mouth daily before breakfast. 07/03/19  Yes Mahala Menghini, PA-C  primidone (MYSOLINE) 50 MG tablet Take 50-100 mg by mouth See admin instructions. 50mg  in am and 100mg  in PM   Yes [provider]  Denosumab (PROLIA [place]) Inject into the skin every 6 (six) months.    [provider]    Allergies as of 07/03/2019 - Review Complete 07/03/2019  Allergen Reaction Noted   Codeine Nausea And Vomiting 04/02/2011   Penicillins Hives, Rash, and Other (See Comments) 04/02/2011    Family History  Problem Relation Age of Onset   Colon cancer Mother    Colon cancer Father    Tremor Father    Melanoma Brother    Colon cancer Paternal Grandmother    Colon cancer Paternal Grandfather     Social History   Socioeconomic History   Marital status: Single    Spouse name: Not on file   Number of children: 1   Years of education: Not on file   Highest education level: Not on file  Occupational History   Occupation: disability  Tobacco Use   Smoking status: Current Some Day Smoker     Packs/day: 0.50    Years: 45.00    Pack years: 22.50    Types: Cigarettes   Smokeless tobacco: Never Used   Tobacco comment: cut down no to about 10 cigarettes per week  Vaping Use   Vaping Use: Never used  Substance and Sexual Activity   Alcohol use: Not Currently    Comment: no prior history heavy etoh use   Drug use: Not Currently   Sexual activity: Not Currently  Other Topics Concern   Not on file  Social History Narrative   Not on file   Social Determinants of Health   Financial Resource Strain: Low Risk    Difficulty of Paying Living Expenses: Not hard at all  Food Insecurity: No Food Insecurity   Worried About Running  Out of Food in the Last Year: Never true   Ran Out of Food in the Last Year: Never true  Transportation Needs: No Transportation Needs   Lack of Transportation (Medical): No   Lack of Transportation (Non-Medical): No  Physical Activity: Sufficiently Active   Days of Exercise per Week: 5 days   Minutes of Exercise per Session: 30 min  Stress: No Stress Concern Present   Feeling of Stress : Not at all  Social Connections: Moderately Isolated   Frequency of Communication with Friends and Family: More than three times a week   Frequency of Social Gatherings with Friends and Family: More than three times a week   Attends Religious Services: 1 to 4 times per year   Active Member of [employer] or Organizations: No   Attends Music therapist: Never   Marital Status: Never married  Human resources officer Violence: Not At Risk   Fear of Current or Ex-Partner: No   Emotionally Abused: No   Physically Abused: No   Sexually Abused: No    Review of Systems: See HPI, otherwise negative ROS  Physical Exam: BP (!) 120/97    Temp 98.1 F (36.7 C) (Oral)    Resp 18    SpO2 93%  General:   Alert,  Well-developed, well-nourished, pleasant and cooperative in NAD Neck:  Supple; no masses or thyromegaly. No significant cervical  adenopathy. Lungs:  Clear throughout to auscultation.   No wheezes, crackles, or rhonchi. No acute distress. Heart:  Regular rate and rhythm; no murmurs, clicks, rubs,  or gallops. Abdomen: Non-distended, normal bowel sounds.  Soft and nontender without appreciable mass or hepatosplenomegaly.  Pulses:  Normal pulses noted. Extremities:  Without clubbing or edema.  Impression/Plan: 69 year old lady with relatively new diagnosis of cirrhosis.  Needs screening EGD.  Also, history of colonic adenoma here for surveillance colonoscopy.  Positive family history colon cancer. The risks, benefits, limitations, imponderables and alternatives regarding both EGD and colonoscopy have been reviewed with the patient. Questions have been answered. All parties agreeable.      Notice: This dictation was prepared with Dragon dictation along with smaller phrase technology. Any transcriptional errors that result from this process are unintentional and may not be corrected upon review.

## 2019-10-12 NOTE — Anesthesia Postprocedure Evaluation (Signed)
Anesthesia Post Note  Patient: [name]  Procedure(s) Performed: COLONOSCOPY WITH PROPOFOL (N/A ) ESOPHAGOGASTRODUODENOSCOPY (EGD) WITH PROPOFOL (N/A ) BIOPSY POLYPECTOMY  Patient location during evaluation: PACU Anesthesia Type: General Level of consciousness: awake, oriented, awake and alert and patient cooperative Pain management: pain level controlled Vital Signs Assessment: post-procedure vital signs reviewed and stable Respiratory status: spontaneous breathing, respiratory function stable and nonlabored ventilation Cardiovascular status: blood pressure returned to baseline and stable Postop Assessment: no headache and no backache Anesthetic complications: no   No complications documented.   Last Vitals:  Vitals:   10/12/19 0651  BP: (!) 120/97  Resp: 18  Temp: 36.7 C  SpO2: 93%    Last Pain:  Vitals:   10/12/19 0732  TempSrc:   PainSc: 0-No pain                 Tacy Learn

## 2019-10-12 NOTE — Op Note (Signed)
[facility] Patient Name: Laurie Moreno Procedure Date: 10/12/2019 7:07 AM MRN: [id number] Date of Birth: 07/08/1950 Attending MD: Norvel Richards , MD CSN: [id number] Age: 69 Admit Type: Outpatient Procedure:                Upper GI endoscopy Indications:              Screening procedure, Cirrhosis rule out esophageal                            varices Providers:                Norvel Richards, MD, Janeece Riggers, RN, Crystal                            Page, Aram Candela Referring MD:              Medicines:                Propofol per Anesthesia Complications:            No immediate complications. Estimated Blood Loss:     Estimated blood loss was minimal. Procedure:                Pre-Anesthesia Assessment:                           - Prior to the procedure, a History and Physical                            was performed, and patient medications and                            allergies were reviewed. The patient's tolerance of                            previous anesthesia was also reviewed. The risks                            and benefits of the procedure and the sedation                            options and risks were discussed with the patient.                            All questions were answered, and informed consent                            was obtained. Prior Anticoagulants: The patient has                            taken no previous anticoagulant or antiplatelet                            agents. ASA Grade Assessment: III - A patient with  severe systemic disease. After reviewing the risks                            and benefits, the patient was deemed in                            satisfactory condition to undergo the procedure.                           After obtaining informed consent, the endoscope was                            passed under direct vision. Throughout the                            procedure, the patient's  blood pressure, pulse, and                            oxygen saturations were monitored continuously. The                            [id number]-[id number] ([id number]) scope was introduced through the                            mouth, and advanced to the second part of duodenum.                            The upper GI endoscopy was accomplished without                            difficulty. The patient tolerated the procedure                            well. Scope In: 7:34:54 AM Scope Out: 7:41:19 AM Total Procedure Duration: 0 hours 6 minutes 25 seconds  Findings:      The examined esophagus was normal. No varices.      Mild portal hypertensive gastropathy was found in the gastric body.       Multiple long (5 to 6 cm) erosions in the body and fundus overlying       mucosal folds. Did not see a hiatal hernia. No ulcer or infiltrating       process seen. Pylorus patent. This was biopsied with a cold forceps for       histology. Estimated blood loss was minimal.      The duodenal bulb and second portion of the duodenum were normal. Impression:               - Normal esophagus.                           - Portal hypertensive gastropathy. Linear gastric                            erosions. Biopsied.                           -  Normal duodenal bulb and second portion of the                            duodenum. Moderate Sedation:      Moderate (conscious) sedation was personally administered by an       anesthesia professional. The following parameters were monitored: oxygen       saturation, heart rate, blood pressure, respiratory rate, EKG, adequacy       of pulmonary ventilation, and response to care. Recommendation:           - Patient has a contact number available for                            emergencies. The signs and symptoms of potential                            delayed complications were discussed with the                            patient. Return to normal activities tomorrow.                             Written discharge instructions were provided to the                            patient.                           - Resume previous diet.                           - Continue present medications. Follow-up on                            pathology. See colonoscopy report. Procedure Code(s):        --- Professional ---                           [phone], Esophagogastroduodenoscopy, flexible,                            transoral; with biopsy, single or multiple Diagnosis Code(s):        --- Professional ---                           K76.6, Portal hypertension                           K31.89, Other diseases of stomach and duodenum                           Z13.810, Encounter for screening for upper                            gastrointestinal disorder  K74.60, Unspecified cirrhosis of liver CPT copyright 2019 American Medical Association. All rights reserved. The codes documented in this report are preliminary and upon coder review may  be revised to meet current compliance requirements. Cristopher Estimable. Talibah Colasurdo, MD Norvel Richards, MD 10/12/2019 8:15:20 AM This report has been signed electronically. Number of Addenda: 0

## 2019-10-16 LAB — SURGICAL PATHOLOGY

## 2019-10-17 ENCOUNTER — Encounter (HOSPITAL_COMMUNITY): Payer: Self-pay | Admitting: Internal Medicine

## 2019-10-17 ENCOUNTER — Encounter: Payer: Self-pay | Admitting: Internal Medicine

## 2019-10-23 ENCOUNTER — Ambulatory Visit (HOSPITAL_COMMUNITY)
Admission: RE | Admit: 2019-10-23 | Discharge: 2019-10-23 | Disposition: A | Discharge status: Home / Self Care | Payer: Medicare Other | Source: Ambulatory Visit | Attending: Nephrology | Admitting: Nephrology

## 2019-10-23 ENCOUNTER — Other Ambulatory Visit: Payer: Self-pay

## 2019-10-23 DIAGNOSIS — Z1231 Encounter for screening mammogram for malignant neoplasm of breast: Secondary | ICD-10-CM | POA: Diagnosis not present

## 2019-10-25 ENCOUNTER — Inpatient Hospital Stay (HOSPITAL_BASED_OUTPATIENT_CLINIC_OR_DEPARTMENT_OTHER): Payer: Medicare Other | Admitting: Oncology

## 2019-10-25 VITALS — BP 112/66 | HR 84 | Temp 99.8°F | Resp 19 | Wt 181.8 lb

## 2019-10-25 DIAGNOSIS — D696 Thrombocytopenia, unspecified: Secondary | ICD-10-CM | POA: Diagnosis not present

## 2019-10-25 DIAGNOSIS — K7460 Unspecified cirrhosis of liver: Secondary | ICD-10-CM

## 2019-10-25 DIAGNOSIS — Z170 Estrogen receptor positive status [ER+]: Secondary | ICD-10-CM

## 2019-10-25 DIAGNOSIS — C50212 Malignant neoplasm of upper-inner quadrant of left female breast: Secondary | ICD-10-CM

## 2019-10-25 DIAGNOSIS — M85852 Other specified disorders of bone density and structure, left thigh: Secondary | ICD-10-CM

## 2019-10-25 NOTE — Patient Instructions (Signed)
[place] at [facility] Discharge Instructions  You were seen today by Kirby Crigler PA. He went over your recent mammogram and lab results. Continue taking anastrozole daily and Prolia injections every 6 months. He will see you back in 4 months for labs and follow up.   Thank you for choosing [place] at [facility] to provide your oncology and hematology care.  To afford each patient quality time with our provider, please arrive at least 15 minutes before your scheduled appointment time.   If you have a lab appointment with the [facility] please come in thru the  Main Entrance and check in at the main information desk  You need to re-schedule your appointment should you arrive 10 or more minutes late.  We strive to give you quality time with our providers, and arriving late affects you and other patients whose appointments are after yours.  Also, if you no show three or more times for appointments you may be dismissed from the clinic at the providers discretion.     Again, thank you for choosing [facility]  Our hope is that these requests will decrease the amount of time that you wait before being seen by our physicians.       _____________________________________________________________  Should you have questions after your visit to [facility], please contact our office at [phone] between the hours of 8:00 a.m. and 4:30 p.m.  Voicemails left after 4:00 p.m. will not be returned until the following business day.  For prescription refill requests, have your pharmacy contact our office and allow 72 hours.    Cancer Center Support Programs:   > Cancer Support Group  2nd Tuesday of the month 1pm-2pm, Journey Room

## 2019-10-25 NOTE — Progress Notes (Signed)
Zhou-Talbert, [name], MD [address] [place] [address]  Malignant neoplasm of upper-inner quadrant of left breast in female, estrogen receptor positive ([place])  Osteopenia of neck of left femur  Thrombocytopenia (HCC)  Cirrhosis of liver without ascites, unspecified hepatic cirrhosis type ([place])   HISTORY OF PRESENT ILLNESS: Stage Ia (T1BN0) left breast IDC, ER/PR positive, HER-2 negative: -Left modified radical mastectomy and SLNB on 01/23/2019, 0.8 cm IDC, margins negative, grade 2. -Anastrozole started on 03/03/2019.  She will take this for 5 years.  CURRENT STATUS: Laurie Moreno 69 y.o. female returns for followup of breast cancer undergoing anti-endocrine therapy with curative intent.  She is up to date on mammograms with her most recent one being completed yesterday and was BIRADS1.  She will be due for her next mammogram in 10/2020.  She denies any NEW lumps or bumps on her own examination.  No new pain.  No cough or hemoptysis.  She denies any abdominal bloating or discomfort.  No new neurological deficits including HA, dizziness, 2x vision, LOC, and seizure.  At time she has regrets about her mastectomy.  She has 3 separate, but all in the same vicinity, palpable subcutaneous nodules in the left lower back, lateral to spine.  She has been out dancing and I have encouraged ongoing dancing as this is great exercise.   Review of Systems  Constitutional: Negative.  Negative for chills, fever and weight loss.  HENT: Negative.   Eyes: Negative.   Respiratory: Negative.  Negative for cough.   Cardiovascular: Negative.  Negative for chest pain.  Gastrointestinal: Negative.  Negative for blood in stool, constipation, diarrhea, melena, nausea and vomiting.  Genitourinary: Negative.   Musculoskeletal: Negative.   Skin: Negative.        Nodules on back  Neurological: Negative.  Negative for weakness.  Endo/Heme/Allergies: Negative.   Psychiatric/Behavioral:  Negative.     Past Medical History:  Diagnosis Date  . Acute pyelonephritis   . Anxiety   . Bipolar disorder, unspecified ([place])    From [facility] form  . CHF (congestive heart failure) ([place])    see LOV 06-25-16 epic care everywhere ; "sister reports that patient has a 'weak heart'  but they did testing and she hasnt had to go back since"  . Chronic respiratory failure with hypoxia ([place])   . Complication of anesthesia    reports with last surgery on 09-07-17 it took 3 hours for her to wake up and she was on the ventilator post operatively for an additional  8 hours   . COPD (chronic obstructive pulmonary disease) ([place])   . Dermatitis   . Dermatitis   . Dysrhythmia   . Elevated temperature 10/26/2017   temp of 99.9 , denies cold/flu sx; reports she gets frwquent UTIs, patient sister accompanying today  reports she is going to Dr Gloriann Loan (surgeon) office today  for pre-op appt  at 2pm  . Essential tremor   . Family history of adverse reaction to anesthesia    sister gets nausea and vomiting   . Fever   . History of ESBL E. coli infection    10-26-17 this RN did not enter this ESBL inf hx; patient and sister deny hx of ESBL infection and there is no supporting lab work for ESBL infection in epic   . Hyperlipidemia   . Hypothyroidism   . Thrombocytopenia ([place])   . Unspecified hydronephrosis   . Urinary incontinence  PHYSICAL EXAMINATION  ECOG PERFORMANCE STATUS: 1 - Symptomatic but completely ambulatory  Vitals:   10/25/19 1432  BP: 112/66  Pulse: 84  Resp: 19  Temp: 99.8 F (37.7 C)  SpO2: 95%    GENERAL:no distress, well nourished, well developed, comfortable, cooperative, obese, smiling and accompanied SKIN: skin color, texture, turgor are normal, no rashes or significant lesions HEAD: Normocephalic, No masses, lesions, tenderness or abnormalities EYES: normal EARS: External ears normal OROPHARYNX:Not examined, mask in place  NECK: supple, no adenopathy LYMPH:  no  palpable lymphadenopathy BREAST:not examined, mammogram completed yesterday and was negative. LUNGS: clear to auscultation  HEART: regular rate & rhythm ABDOMEN:abdomen soft, non-tender, obese and normal bowel sounds BACK: Back symmetric, no curvature., left lower back subcutaneous nodules (3) 0.5-1 cm in size, firm but mobile, no overlying skin abnormality, non-tender. EXTREMITIES:no skin discoloration  NEURO: alert & oriented x 3 with fluent speech, no focal motor/sensory deficits, gait normal   LABORATORY DATA: CBC    Component Value Date/Time   WBC 5.6 10/09/2019 1037   RBC 4.71 10/09/2019 1037   HGB 14.8 10/09/2019 1037   HCT 44.1 10/09/2019 1037   PLT 124 (L) 10/09/2019 1037   MCV 93.6 10/09/2019 1037   MCH 31.4 10/09/2019 1037   MCHC 33.6 10/09/2019 1037   RDW 12.7 10/09/2019 1037   LYMPHSABS 3.3 10/09/2019 1037   MONOABS 0.7 10/09/2019 1037   EOSABS 0.0 10/09/2019 1037   BASOSABS 0.1 10/09/2019 1037      Chemistry      Component Value Date/Time   NA 138 10/09/2019 1037   K 4.6 10/09/2019 1037   CL 104 10/09/2019 1037   CO2 26 10/09/2019 1037   BUN 17 10/09/2019 1037   CREATININE 0.71 10/09/2019 1037   CREATININE 0.79 12/23/2018 1228      Component Value Date/Time   CALCIUM 9.0 10/09/2019 1037   ALKPHOS 38 10/09/2019 1037   AST 17 10/09/2019 1037   ALT 9 10/09/2019 1037   BILITOT 0.8 10/09/2019 1037       RADIOGRAPHIC STUDIES:  MM 3D SCREEN BREAST UNI RIGHT  Result Date: 10/25/2019 CLINICAL DATA:  Screening. EXAM: DIGITAL SCREENING UNILATERAL RIGHT MAMMOGRAM WITH CAD AND TOMO COMPARISON:  Previous exam(s). ACR Breast Density Category b: There are scattered areas of fibroglandular density. FINDINGS: There are no findings suspicious for malignancy. Images were processed with CAD. IMPRESSION: No mammographic evidence of malignancy. A result letter of this screening mammogram will be mailed directly to the patient. RECOMMENDATION: Screening mammogram in one  year. (Code:SM-B-01Y) BI-RADS CATEGORY  1: Negative. Electronically Signed   By: Lajean Manes M.D.   On: 10/25/2019 12:27       ASSESSMENT AND PLAN:  1. Malignant neoplasm of upper-inner quadrant of left breast in female, estrogen receptor positive ([place]) Stage Ia (T1BN0) left breast IDC, ER/PR positive, HER-2 negative: -Left modified radical mastectomy and SLNB on 01/23/2019, 0.8 cm IDC, margins negative, grade 2. -Anastrozole started on 03/03/2019.  She will take this for 5 years.  Labs performed on 10/09/2019 are reviewed which reveals a mild thrombocytopenia 124,000 which is stable and an unremarkable metabolic panel.  Mammogram performed today was BI-RADS Category 1.  Next screening mammogram is due in 10/2020.  Next Bone density examination is due in 03/2021.  Continue anastrozole daily.  Labs in 4 months: CBC diff, CMET, Vit D.  Subcutaneous back nodules appears to be lipomatous in nature.  Will follow along clinically.  Return in 4 months for follow-up.  2. Osteopenia of neck of left femur Baseline bone density examination demonstrated osteopenia.  On calcium and vitamin D supplementation.  She is also on Prolia therapy every 6 months.  Next injection due at the beginning on 04/2020.  3. Thrombocytopenia ([place]) Secondary to cirrhosis of liver with stable platelet count of 124,000 today.  4. Cirrhosis of liver without ascites, unspecified hepatic cirrhosis type ([place]) Noted on imaging.   ORDERS PLACED FOR THIS ENCOUNTER: No orders of the defined types were placed in this encounter.   MEDICATIONS PRESCRIBED THIS ENCOUNTER: No orders of the defined types were placed in this encounter.   All questions were answered. The patient knows to call the clinic with any problems, questions or concerns. We can certainly see the patient much sooner if necessary.  Patient and plan discussed with Dr. Derek Jack and he is in agreement with the aforementioned.   This note is  electronically signed by: Robynn Pane, PA-C 10/25/2019 3:12 PM

## 2019-11-29 ENCOUNTER — Ambulatory Visit (INDEPENDENT_AMBULATORY_CARE_PROVIDER_SITE_OTHER): Payer: Medicare Other | Admitting: Gastroenterology

## 2019-11-29 ENCOUNTER — Other Ambulatory Visit: Payer: Self-pay

## 2019-11-29 ENCOUNTER — Encounter: Payer: Self-pay | Admitting: Gastroenterology

## 2019-11-29 VITALS — BP 135/78 | HR 96 | Temp 96.6°F | Ht 65.0 in | Wt 183.4 lb

## 2019-11-29 DIAGNOSIS — K7460 Unspecified cirrhosis of liver: Secondary | ICD-10-CM | POA: Diagnosis not present

## 2019-11-29 NOTE — Progress Notes (Signed)
Primary Care Physician: Zhou-Talbert, [name], MD  Primary Gastroenterologist:  Formerly Barney Drain, MD   Chief Complaint  Patient presents with  . Cirrhosis    HPI: Laurie Moreno is a 69 y.o. female here for follow-up of cirrhosis.  She had an EGD and colonoscopy in June 2021.  She had mild portal hypertensive gastropathy, linear gastric erosions.  No varices.  Biopsy showed reactive gastropathy but no H. pylori.  Plans for repeat EGD in 2 years to screen for varices.  On colonoscopy she had 1.5 cm verrucous, raised nodule upper gluteal fold, scattered diverticula, 3 sessile tubular adenomas removed.  Plans for next colonoscopy in 5 years.  She is advised to see dermatology for the nodule noted near the upper end of the gluteal folds.  Cirrhosis first noted at time of CT in April 2019.  Imaging showed nodular liver consistent with mild cirrhosis.  Spleen normal.  Thrombocytopenia ranging between 78,000-272,000 since 2019.  Labs in August 2020, no evidence of iron overload, immune to hepatitis A, no active hepatitis B nor immunity, hepatitis C antibody nonreactive, ceruloplasmin normal. Abdominal ultrasound August 2020 with multiple gallstones, increased echogenicity consistent with fatty infiltration of the liver and/or hepatocellular disease, spleen normal in size, adjacent varices cannot be excluded, left nephrectomy.  Right upper quadrant ultrasound March 2021 showing gallstones, fatty appearing liver, no hepatoma.  Patient had labs in June 2021. MELD Na 7. Due ruq u/s in 01/2020. Sees dermatology tomorrow for follow up nodule seen at upper gluteal folds. Walking on the treadmill three times per week (30 minutes at a time). Walks slowly to be safe. Walks the dog 1/2 mile daily. No abdominal pain. BM regular. No melena, brbpr. No heartburn. No dysphagia. No vomiting. Very little lower extremity edema.   Current Outpatient Medications  Medication Sig Dispense Refill  . albuterol  (VENTOLIN HFA) 108 (90 Base) MCG/ACT inhaler Inhale 2 puffs into the lungs every 6 (six) hours as needed for wheezing or shortness of breath.     . anastrozole (ARIMIDEX) 1 MG tablet Take 1 tablet (1 mg total) by mouth daily. 90 tablet 3  . atorvastatin (LIPITOR) 40 MG tablet Take 40 mg by mouth daily.     . Calcium-Magnesium-Vitamin D (CALCIUM 1200+D3 PO) Take 1 tablet by mouth in the morning and at bedtime.    . carbidopa-levodopa (SINEMET IR) 25-100 MG tablet Take 1 tablet by mouth 3 (three) times daily.     . carvedilol (COREG) 3.125 MG tablet Take 3.125 mg by mouth 2 (two) times daily with a meal.    . clonazePAM (KLONOPIN) 0.5 MG tablet Take 1 tablet (0.5 mg total) by mouth 2 (two) times daily. (Patient taking differently: Take 0.5 mg by mouth at bedtime. ) 60 tablet 0  . Denosumab (PROLIA [place]) Inject into the skin every 6 (six) months.    . divalproex (DEPAKOTE) 250 MG DR tablet Take 250 mg by mouth 3 (three) times daily. Do not crush    . escitalopram (LEXAPRO) 20 MG tablet Take 20 mg by mouth daily.    Marland Kitchen gabapentin (NEURONTIN) 300 MG capsule Take 300 mg by mouth 4 (four) times daily.    . hydrOXYzine (ATARAX/VISTARIL) 25 MG tablet Take 25 mg by mouth every 8 (eight) hours as needed for itching.     . levothyroxine (SYNTHROID, LEVOTHROID) 125 MCG tablet Take 125 mcg by mouth daily before breakfast.    . Multiple Vitamin (MULTIVITAMIN) tablet Take 1 tablet by  mouth every morning.     . pantoprazole (PROTONIX) 40 MG tablet Take 1 tablet (40 mg total) by mouth daily before breakfast. 90 tablet 3  . primidone (MYSOLINE) 50 MG tablet Take 100 mg by mouth 2 (two) times daily.      No current facility-administered medications for this visit.    Allergies as of 11/29/2019 - Review Complete 11/29/2019  Allergen Reaction Noted  . Codeine Nausea And Vomiting 04/02/2011  . Penicillins Hives, Rash, and Other (See Comments) 04/02/2011    ROS:  General: Negative for anorexia, weight loss, fever,  chills, fatigue, weakness. ENT: Negative for hoarseness, difficulty swallowing , nasal congestion. CV: Negative for chest pain, angina, palpitations, dyspnea on exertion, peripheral edema.  Respiratory: Negative for dyspnea at rest, dyspnea on exertion, cough, sputum, wheezing.  GI: See history of present illness. GU:  Negative for dysuria, hematuria, urinary incontinence, urinary frequency, nocturnal urination.  Endo: Negative for unusual weight change.    Physical Examination:   BP (!) 135/78   Pulse 96   Temp (!) 96.6 F (35.9 C) (Temporal)   Ht 5\' 5"  (1.651 m)   Wt 183 lb 6.4 oz (83.2 kg)   BMI 30.52 kg/m   General: Well-nourished, well-developed in no acute distress.  Eyes: No icterus. Mouth: masked.  Abdomen: Bowel sounds are normal, nontender, nondistended, no hepatosplenomegaly or masses, no abdominal bruits or hernia , no rebound or guarding.   Extremities: trace bilateral lower extremity edema. No clubbing or deformities. Neuro: Alert and oriented x 4   Skin: Warm and dry, no jaundice.   Psych: Alert and cooperative, normal mood and affect.  Labs:  Lab Results  Component Value Date   CREATININE 0.71 10/09/2019   BUN 17 10/09/2019   NA 138 10/09/2019   K 4.6 10/09/2019   CL 104 10/09/2019   CO2 26 10/09/2019   Lab Results  Component Value Date   ALT 9 10/09/2019   AST 17 10/09/2019   ALKPHOS 38 10/09/2019   BILITOT 0.8 10/09/2019   Lab Results  Component Value Date   INR 1.1 10/09/2019   INR 1.1 12/23/2018   INR 1.08 10/26/2017   Lab Results  Component Value Date   WBC 5.6 10/09/2019   HGB 14.8 10/09/2019   HCT 44.1 10/09/2019   MCV 93.6 10/09/2019   PLT 124 (L) 10/09/2019     Imaging Studies: No results found.  Impression/plan:  Pleasant 69 year old female presenting for follow-up of cirrhosis.  Suspect due to [place].  Previous hemochromatosis screen negative.  LFTs have been normal.  She has received 2 of 3 hepatitis B vaccination shots.  She  is immune to hepatitis A.  Current meld sodium score of 7.  We discussed the need to continue routine visits with [place] as well as labs and ultrasound every 6 months for hepatoma screening.  She will need a EGD in 2 years for varices screening.  Next colonoscopy in 5 years for history of adenomatous colon polyps and family history of colon cancer.  Encouraged to maintain physical activity.  Try to drop 10 to 15 pounds if possible.  She will have lab work in December, repeat right upper quadrant ultrasound in October.  We will see her back in 6 months.  Call sooner if needed.

## 2019-11-29 NOTE — Patient Instructions (Signed)
1. We will let you know when your next liver ultrasound is due (02/2020). 2. Your next labs are due in 04/2020. We will send you orders in the mail. Please take those with you when your next labs are due for the cancer center.  3. Return to the office in six months.

## 2019-11-30 ENCOUNTER — Other Ambulatory Visit: Payer: Self-pay | Admitting: Emergency Medicine

## 2019-11-30 DIAGNOSIS — K7460 Unspecified cirrhosis of liver: Secondary | ICD-10-CM

## 2019-11-30 NOTE — Addendum Note (Signed)
Addended by: Mahala Menghini on: 11/30/2019 01:37 PM   Modules accepted: Orders

## 2020-01-05 ENCOUNTER — Ambulatory Visit: Payer: Medicare Other | Admitting: Gastroenterology

## 2020-02-01 ENCOUNTER — Encounter: Payer: Self-pay | Admitting: Pulmonary Disease

## 2020-02-01 ENCOUNTER — Other Ambulatory Visit: Payer: Self-pay

## 2020-02-01 ENCOUNTER — Ambulatory Visit (INDEPENDENT_AMBULATORY_CARE_PROVIDER_SITE_OTHER): Payer: Medicare Other | Admitting: Pulmonary Disease

## 2020-02-01 DIAGNOSIS — J449 Chronic obstructive pulmonary disease, unspecified: Secondary | ICD-10-CM | POA: Diagnosis not present

## 2020-02-01 NOTE — Progress Notes (Signed)
Subjective:    Patient ID: Laurie Moreno, female    DOB: 1951-04-14, 69 y.o.   MRN: [id number]  HPI  69 year old smoker referred for evaluation of abnormal spirometry showing restriction. She is accompanied by sister Sharmon Revere, who corroborates history.  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  She smoked about half pack per day starting in her 76s, about 20 pack years. Sister reports hospitalization in 2019 x 3 weeks at [facility] for community-acquired pneumonia when she required mechanical ventilation .  She has recovered well since then.  She used to be in assisted living but has now moved in with her sister. She denies any dyspnea on exertion.  She is able to use the push mower to mow her yard .  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 Acute pyelonephritis   . Anxiety   . Bipolar disorder, unspecified ([place])    From [facility] form  . CHF (congestive heart failure) ([place])    see LOV 06-25-16 epic care everywhere ; "sister reports that patient has a 'weak heart'  but they did testing and she hasnt had to go back since"  . Chronic  respiratory failure with hypoxia ([place])   . Complication of anesthesia    reports with last surgery on 09-07-17 it took 3 hours for her to wake up and she was on the ventilator post operatively for an additional  8 hours   . COPD (chronic obstructive pulmonary disease) ([place])   . Dermatitis   . Dermatitis   . Dysrhythmia   . Elevated temperature 10/26/2017   temp of 99.9 , denies cold/flu sx; reports she gets frwquent UTIs, patient sister accompanying today  reports she is going to Dr Gloriann Loan (surgeon) office today  for pre-op appt  at 2pm  . Essential tremor   . Family history of adverse reaction to anesthesia    sister gets nausea and vomiting   . Fever   . History of ESBL E. coli infection    10-26-17 this RN did not enter this ESBL inf hx; patient and sister deny hx of ESBL infection and there is no supporting lab work for ESBL infection in epic   . Hyperlipidemia   . Hypothyroidism   . Thrombocytopenia ([place])   . Unspecified hydronephrosis   . Urinary incontinence    Past Surgical History:  Procedure Laterality Date  . BIOPSY  10/12/2019   Procedure: BIOPSY;  Surgeon: Daneil Dolin, MD;  Location: [facility] SU[facility];  Service: Endoscopy;;  . CARDIAC CATHETERIZATION  07/15/2016   at [facility]  . COLONOSCOPY  12/2013   Dr. Aaron Edelman C. Bobby Rumpf at [facility], long tortuous colon, nonbleeding external hemorrhoids, questionable small subcentimeter benign/hyperplastic polyp in the ascending colon, not removed.  Advised to have repeat colonoscopy in 4 years.  . COLONOSCOPY WITH PROPOFOL N/A 10/12/2019   Rourk: 1.5 cm verrucous, raised nodule upper end of her gluteal folds.  Scattered diverticula.  3 sessile polyps removed, tubular adenomas.  Next colonoscopy in 5 years.  . CYSTOSCOPY W/ URETERAL STENT PLACEMENT Left 09/07/2017   Procedure: CYSTOSCOPY WITH RETROGRADE PYELOGRAM/URETERAL STENT PLACEMENT;  Surgeon: Lucas Mallow, MD;  Location: [facility] ORS;  Service: Urology;   Laterality: Left;  . ESOPHAGOGASTRODUODENOSCOPY (EGD) WITH PROPOFOL N/A 10/12/2019   Rourk: Portal hypertensive gastropathy.  Linear gastric erosions.  No varices.  Biopsy showed reactive gastropathy.  No H. pylori.  Repeat EGD in 2 years to screen for varices.  Marland Kitchen EYE SURGERY  5 years ago    bilateral cataract extraction   . eyelid lift Bilateral   . LAPAROSCOPIC NEPHRECTOMY, HAND ASSISTED Left 11/01/2017   Procedure: LEFT HAND ASSISTED LAPAROSCOPIC NEPHRECTOMY;  Surgeon: Lucas Mallow, MD;  Location: [facility] ORS;  Service: Urology;  Laterality: Left;  Marland Kitchen MASTECTOMY MODIFIED RADICAL Left 01/23/2019   Procedure: LEFT MODIFIED RADICAL MASTECTOMY;  Surgeon: Aviva Signs, MD;  Location: [facility] ORS;  Service: General;  Laterality: Left;  . POLYPECTOMY  10/12/2019   Procedure: POLYPECTOMY;  Surgeon: Daneil Dolin, MD;  Location: [facility];  Service: Endoscopy;;    Allergies  Allergen Reactions  . Codeine Nausea And Vomiting    In cough syrup cause nausea and vomiting  . Penicillins Hives, Rash and Other (See Comments)    Did it involve swelling of the face/tongue/throat, SOB, or low BP? Unknown Did it involve sudden or severe rash/hives, skin peeling, or any reaction on the inside of your mouth or nose? Unknown Did you need to seek medical attention at a hospital or doctor's office? Unknown When did it last happen?as a child If all above answers are "NO", may proceed with cephalosporin use.     Social History   Socioeconomic History  . Marital status: Single    Spouse name: Not on file  . Number of children: 1  . Years of education: Not on file  . Highest education level: Not on file  Occupational History  . Occupation: disability  Tobacco Use  . Smoking status: Current Some Day Smoker    Packs/day: 0.50    Years: 45.00    Pack years: 22.50    Types: Cigarettes  . Smokeless tobacco: Never Used  . Tobacco comment: smokes 8 cigarettes per day 02/01/2020  Vaping Use  . Vaping Use:  Never used  Substance and Sexual Activity  . Alcohol use: Not Currently    Comment: no prior history heavy etoh use  . Drug use: Not Currently  . Sexual activity: Not Currently  Other Topics Concern  . Not on file  Social History Narrative  . Not on file   Social Determinants of Health   Financial Resource Strain:   . Difficulty of Paying Living Expenses: Not on file  Food Insecurity:   . Worried About Charity fundraiser in the Last Year: Not on file  . Ran Out of Food in the Last Year: Not on file  Transportation Needs:   . Lack of Transportation (Medical): Not on file  . Lack of Transportation (Non-Medical): Not on file  Physical Activity:   . Days of Exercise per Week: Not on file  . Minutes  of Exercise per Session: Not on file  Stress:   . Feeling of Stress : Not on file  Social Connections:   . Frequency of Communication with Friends and Family: Not on file  . Frequency of Social Gatherings with Friends and Family: Not on file  . Attends Religious Services: Not on file  . Active Member of Clubs or Organizations: Not on file  . Attends Archivist Meetings: Not on file  . Marital Status: Not on file  Intimate Partner Violence:   . Fear of Current or Ex-Partner: Not on file  . Emotionally Abused: Not on file  . Physically Abused: Not on file  . Sexually Abused: Not on file    Family History  Problem Relation Age of Onset  . Colon cancer Mother        died at age 70  . Colon cancer Father        died in his 34s  . Tremor Father   . Melanoma Brother   . Colon cancer Paternal Grandmother        age 13  . Colon cancer Paternal Grandfather        in his 43s  . Liver cancer Maternal Grandfather        died age 68    Review of Systems Acid heartburn Indigestion Difficulty swallowing sometimes Anxiety depression Feet swelling    Objective:   Physical Exam  Gen. Pleasant, obese, in no distress, normal affect ENT - no pallor,icterus, no post nasal  drip, class 2-3 airway Neck: No JVD, no thyromegaly, no carotid bruits Lungs: no use of accessory muscles, no dullness to percussion, decreased without rales or rhonchi  Cardiovascular: Rhythm regular, heart sounds  normal, no murmurs or gallops, no peripheral edema Abdomen: soft and non-tender, no hepatosplenomegaly, BS normal. Musculoskeletal: No deformities, no cyanosis or clubbing Neuro:  alert, non focal, intention tremors +      Assessment & Plan:

## 2020-02-01 NOTE — Assessment & Plan Note (Signed)
She is referred for evaluation of restrictive lung disease.  Spirometry did not show any evidence of airway obstruction.  On exam and on previous x-ray, I did not find any evidence of interstitial lung disease to explain restriction. Ideally we would obtain full PFTs with DLCO to see if she has evidence of emphysema based on her history of smoking.  But regardless focus here out to be on smoking cessation.  I had a long discussion with her and her sister.  She was willing to commit to a quit attempt.  We discussed nicotine supplementation to achieve this goal.  She really does not have any symptoms of dyspnea and has an albuterol MDI to use on a as needed basis.  She has not needed this in 2 years. Prior hypercarbia noted on ABG seems to have been related to sedate of medications some of which have been discontinued. She will follow up with [place] as needed.  If she has respiratory symptoms, then we will proceed with PFTs

## 2020-02-01 NOTE — Patient Instructions (Signed)
Try nicotine patch to cut down   Set quit date on your birthday

## 2020-02-09 ENCOUNTER — Other Ambulatory Visit (HOSPITAL_COMMUNITY): Payer: Self-pay | Admitting: Hematology

## 2020-02-09 DIAGNOSIS — C50212 Malignant neoplasm of upper-inner quadrant of left female breast: Secondary | ICD-10-CM

## 2020-02-09 DIAGNOSIS — Z170 Estrogen receptor positive status [ER+]: Secondary | ICD-10-CM

## 2020-02-20 ENCOUNTER — Other Ambulatory Visit: Payer: Self-pay

## 2020-02-20 ENCOUNTER — Inpatient Hospital Stay (HOSPITAL_COMMUNITY): Payer: Medicare Other | Attending: Hematology | Admitting: Oncology

## 2020-02-20 VITALS — BP 120/84 | HR 88 | Temp 97.2°F | Resp 18 | Wt 176.8 lb

## 2020-02-20 DIAGNOSIS — M85852 Other specified disorders of bone density and structure, left thigh: Secondary | ICD-10-CM | POA: Insufficient documentation

## 2020-02-20 DIAGNOSIS — D6959 Other secondary thrombocytopenia: Secondary | ICD-10-CM | POA: Diagnosis not present

## 2020-02-20 DIAGNOSIS — K7460 Unspecified cirrhosis of liver: Secondary | ICD-10-CM | POA: Insufficient documentation

## 2020-02-20 DIAGNOSIS — Z170 Estrogen receptor positive status [ER+]: Secondary | ICD-10-CM

## 2020-02-20 DIAGNOSIS — C50212 Malignant neoplasm of upper-inner quadrant of left female breast: Secondary | ICD-10-CM | POA: Diagnosis present

## 2020-02-20 DIAGNOSIS — Z79811 Long term (current) use of aromatase inhibitors: Secondary | ICD-10-CM | POA: Insufficient documentation

## 2020-02-20 NOTE — Progress Notes (Signed)
Zhou-Talbert, Laurie Lade, MD [address] [place] [address]  Malignant neoplasm of upper-inner quadrant of left breast in female, estrogen receptor positive ([place]) - Plan: MM 3D SCREEN BREAST UNI RIGHT   HISTORY OF PRESENT ILLNESS: Stage Ia (T1BN0) left breast IDC, ER/PR positive, HER-2 negative: -Left modified radical mastectomy and SLNB on 01/23/2019, 0.8 cm IDC, margins negative, grade 2. -Anastrozole started on 03/03/2019.  She will take this for 5 years.  CURRENT STATUS: Laurie Moreno 69 y.o. female returns for followup of breast cancer undergoing anti-endocrine therapy with curative intent.  She is up to date on mammograms with her most recent one being completed yesterday and was BIRADS1.  She will be due for her next mammogram in 10/2020.  She denies any NEW lumps or bumps on her own examination.  No new pain.  No cough or hemoptysis.  She denies any abdominal bloating or discomfort.  No new neurological deficits including HA, dizziness, 2x vision, LOC, and seizure.   She has 3 separate, but all in the same vicinity, palpable subcutaneous nodules in the left lower back, lateral to spine.  She has been out dancing and I have encouraged ongoing dancing as this is great exercise.  She is currently living with her sister and her husband.  Previously was living at an assisted living facility.  They are actively trying to lose weight.  She is down about 3 pounds.  They are trying weight watchers.   Review of Systems  Constitutional: Negative.  Negative for chills, fever and weight loss.  HENT: Negative.   Eyes: Negative.   Respiratory: Negative.  Negative for cough.   Cardiovascular: Negative.  Negative for chest pain.  Gastrointestinal: Negative.  Negative for blood in stool, constipation, diarrhea, melena, nausea and vomiting.  Genitourinary: Negative.   Musculoskeletal: Negative.   Skin: Negative.        Nodules on back  Neurological: Negative.  Negative for  weakness.  Endo/Heme/Allergies: Negative.   Psychiatric/Behavioral: Negative.     Past Medical History:  Diagnosis Date  . Acute pyelonephritis   . Anxiety   . Bipolar disorder, unspecified ([place])    From [facility] form  . CHF (congestive heart failure) ([place])    see LOV 06-25-16 epic care everywhere ; "sister reports that patient has a 'weak heart'  but they did testing and she hasnt had to go back since"  . Chronic respiratory failure with hypoxia ([place])   . Complication of anesthesia    reports with last surgery on 09-07-17 it took 3 hours for her to wake up and she was on the ventilator post operatively for an additional  8 hours   . COPD (chronic obstructive pulmonary disease) ([place])   . Dermatitis   . Dermatitis   . Dysrhythmia   . Elevated temperature 10/26/2017   temp of 99.9 , denies cold/flu sx; reports she gets frwquent UTIs, patient sister accompanying today  reports she is going to Dr Gloriann Loan (surgeon) office today  for pre-op appt  at 2pm  . Essential tremor   . Family history of adverse reaction to anesthesia    sister gets nausea and vomiting   . Fever   . History of ESBL E. coli infection    10-26-17 this RN did not enter this ESBL inf hx; patient and sister deny hx of ESBL infection and there is no supporting lab work for ESBL infection in epic   . Hyperlipidemia   .  Hypothyroidism   . Thrombocytopenia ([place])   . Unspecified hydronephrosis   . Urinary incontinence      PHYSICAL EXAMINATION  ECOG PERFORMANCE STATUS: 1 - Symptomatic but completely ambulatory  Vitals:   02/20/20 1329  BP: 120/84  Pulse: 88  Resp: 18  Temp: (!) 97.2 F (36.2 C)  SpO2: 95%    GENERAL:no distress, well nourished, well developed, comfortable, cooperative, obese, smiling and accompanied SKIN: skin color, texture, turgor are normal, no rashes or significant lesions HEAD: Normocephalic, No masses, lesions, tenderness or abnormalities EYES: normal EARS: External ears  normal OROPHARYNX:Not examined, mask in place  NECK: supple, no adenopathy LYMPH:  no palpable lymphadenopathy BREAST:not examined, mammogram completed yesterday and was negative. LUNGS: clear to auscultation  HEART: regular rate & rhythm ABDOMEN:abdomen soft, non-tender, obese and normal bowel sounds BACK: Back symmetric, no curvature., left lower back subcutaneous nodules (3) 0.5-1 cm in size, firm but mobile, no overlying skin abnormality, non-tender. EXTREMITIES:no skin discoloration  NEURO: alert & oriented x 3 with fluent speech, no focal motor/sensory deficits, gait normal   LABORATORY DATA: CBC    Component Value Date/Time   WBC 5.6 10/09/2019 1037   RBC 4.71 10/09/2019 1037   HGB 14.8 10/09/2019 1037   HCT 44.1 10/09/2019 1037   PLT 124 (L) 10/09/2019 1037   MCV 93.6 10/09/2019 1037   MCH 31.4 10/09/2019 1037   MCHC 33.6 10/09/2019 1037   RDW 12.7 10/09/2019 1037   LYMPHSABS 3.3 10/09/2019 1037   MONOABS 0.7 10/09/2019 1037   EOSABS 0.0 10/09/2019 1037   BASOSABS 0.1 10/09/2019 1037      Chemistry      Component Value Date/Time   NA 138 10/09/2019 1037   K 4.6 10/09/2019 1037   CL 104 10/09/2019 1037   CO2 26 10/09/2019 1037   BUN 17 10/09/2019 1037   CREATININE 0.71 10/09/2019 1037   CREATININE 0.79 12/23/2018 1228      Component Value Date/Time   CALCIUM 9.0 10/09/2019 1037   ALKPHOS 38 10/09/2019 1037   AST 17 10/09/2019 1037   ALT 9 10/09/2019 1037   BILITOT 0.8 10/09/2019 1037       RADIOGRAPHIC STUDIES:  No results found.     ASSESSMENT AND PLAN:  1. Malignant neoplasm of upper-inner quadrant of left breast in female, estrogen receptor positive ([place]) Stage Ia (T1BN0) left breast IDC, ER/PR positive, HER-2 negative: -Left modified radical mastectomy and SLNB on 01/23/2019, 0.8 cm IDC, margins negative, grade 2. -Anastrozole started on 03/03/2019.  She will take this for 5 years.  Labs performed on 10/09/2019 are reviewed which reveals a mild  thrombocytopenia 124,000 which is stable and an unremarkable metabolic panel.  Lab work is scheduled for December along with her Prolia.  Most recent mammogram from June 2021 was normal BI-RADS 1.  New orders placed for mammogram to be done prior to her next visit in June 2022.  Next Bone density examination is due in 03/2021.  Continue anastrozole daily.  Labs in 6 months: CBC diff, CMET, Vit D.  Subcutaneous back nodules appears to be lipomatous in nature.  Will follow along clinically-no change at this time.   2. Osteopenia of neck of left femur Baseline bone density examination demonstrated osteopenia.  On calcium and vitamin D supplementation.  She is also on Prolia therapy every 6 months.  Next injection due at the beginning on 04/2020.  3. 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  4. Cirrhosis of liver without ascites, unspecified hepatic cirrhosis type ([place]) Noted on imaging.   ORDERS PLACED FOR THIS ENCOUNTER: Orders Placed This Encounter  Procedures  . MM 3D SCREEN BREAST UNI RIGHT    Disposition: RTC in December for Prolia injection and lab work. RTC in June 2022 for follow-up visit with MD, labs (CBC, CMP, Vitamin D), breast exam and review mammogram. Continue anastrozole. Continue vitamin D and calcium supplementation.   MEDICATIONS PRESCRIBED THIS ENCOUNTER: No orders of the defined types were placed in this encounter.  Greater than 50% was spent in counseling and coordination of care with this patient including but not limited to discussion of the relevant topics above (See A&P) including, but not limited to diagnosis and management of acute and chronic medical conditions.   Faythe Casa, NP 02/20/2020 2:55 PM

## 2020-02-23 ENCOUNTER — Ambulatory Visit (HOSPITAL_COMMUNITY): Payer: Medicare Other | Admitting: Nurse Practitioner

## 2020-03-04 ENCOUNTER — Telehealth: Payer: Self-pay | Admitting: Internal Medicine

## 2020-03-04 NOTE — Telephone Encounter (Signed)
Letter mailed

## 2020-03-04 NOTE — Telephone Encounter (Signed)
Recall for ultrasound 

## 2020-03-12 ENCOUNTER — Telehealth: Payer: Self-pay | Admitting: Internal Medicine

## 2020-03-12 DIAGNOSIS — K7460 Unspecified cirrhosis of liver: Secondary | ICD-10-CM

## 2020-03-12 NOTE — Telephone Encounter (Signed)
[place] abd RUQ scheduled for 03/21/20 at 9:30am, arrive at 9:15am. NPO after midnight prior to test.   Called and informed pt of [place] appt. Letter mailed.

## 2020-03-12 NOTE — Telephone Encounter (Signed)
Pt received letter to schedule her [place] of Abd.  Please call [phone]

## 2020-03-21 ENCOUNTER — Ambulatory Visit (HOSPITAL_COMMUNITY)
Admission: RE | Admit: 2020-03-21 | Discharge: 2020-03-21 | Disposition: A | Discharge status: Home / Self Care | Payer: Medicare Other | Source: Ambulatory Visit | Attending: Gastroenterology | Admitting: Gastroenterology

## 2020-03-21 ENCOUNTER — Other Ambulatory Visit: Payer: Self-pay

## 2020-03-21 DIAGNOSIS — K7460 Unspecified cirrhosis of liver: Secondary | ICD-10-CM | POA: Diagnosis not present

## 2020-03-27 ENCOUNTER — Other Ambulatory Visit: Payer: Self-pay

## 2020-03-27 DIAGNOSIS — K7460 Unspecified cirrhosis of liver: Secondary | ICD-10-CM

## 2020-04-05 ENCOUNTER — Other Ambulatory Visit: Payer: Self-pay

## 2020-04-05 ENCOUNTER — Inpatient Hospital Stay (HOSPITAL_COMMUNITY): Payer: Medicare Other | Attending: Hematology

## 2020-04-05 ENCOUNTER — Inpatient Hospital Stay (HOSPITAL_COMMUNITY): Payer: Medicare Other

## 2020-04-05 ENCOUNTER — Other Ambulatory Visit (HOSPITAL_COMMUNITY)
Admission: RE | Admit: 2020-04-05 | Discharge: 2020-04-05 | Disposition: A | Discharge status: Home / Self Care | Payer: Medicare Other | Source: Ambulatory Visit | Attending: Gastroenterology | Admitting: Gastroenterology

## 2020-04-05 VITALS — BP 135/82 | HR 83 | Temp 96.9°F | Resp 18 | Wt 178.4 lb

## 2020-04-05 DIAGNOSIS — M8580 Other specified disorders of bone density and structure, unspecified site: Secondary | ICD-10-CM | POA: Diagnosis not present

## 2020-04-05 DIAGNOSIS — C50212 Malignant neoplasm of upper-inner quadrant of left female breast: Secondary | ICD-10-CM

## 2020-04-05 DIAGNOSIS — K7460 Unspecified cirrhosis of liver: Secondary | ICD-10-CM | POA: Insufficient documentation

## 2020-04-05 DIAGNOSIS — Z170 Estrogen receptor positive status [ER+]: Secondary | ICD-10-CM

## 2020-04-05 LAB — CBC WITH DIFFERENTIAL/PLATELET
Abs Immature Granulocytes: 0.01 10*3/uL (ref 0.00–0.07)
Abs Immature Granulocytes: 0.01 10*3/uL (ref 0.00–0.07)
Basophils Absolute: 0.1 10*3/uL (ref 0.0–0.1)
Basophils Absolute: 0.1 10*3/uL (ref 0.0–0.1)
Basophils Relative: 1 %
Basophils Relative: 1 %
Eosinophils Absolute: 0.1 10*3/uL (ref 0.0–0.5)
Eosinophils Absolute: 0 10*3/uL (ref 0.0–0.5)
Eosinophils Relative: 1 %
Eosinophils Relative: 1 %
HCT: 46.3 % — ABNORMAL HIGH (ref 36.0–46.0)
HCT: 44.9 % (ref 36.0–46.0)
Hemoglobin: 15.3 g/dL — ABNORMAL HIGH (ref 12.0–15.0)
Hemoglobin: 15.2 g/dL — ABNORMAL HIGH (ref 12.0–15.0)
Immature Granulocytes: 0 %
Immature Granulocytes: 0 %
Lymphocytes Relative: 56 %
Lymphocytes Relative: 55 %
Lymphs Abs: 2.5 10*3/uL (ref 0.7–4.0)
Lymphs Abs: 2.4 10*3/uL (ref 0.7–4.0)
MCH: 31.5 pg (ref 26.0–34.0)
MCH: 32 pg (ref 26.0–34.0)
MCHC: 33 g/dL (ref 30.0–36.0)
MCHC: 33.9 g/dL (ref 30.0–36.0)
MCV: 95.5 fL (ref 80.0–100.0)
MCV: 94.5 fL (ref 80.0–100.0)
Monocytes Absolute: 0.5 10*3/uL (ref 0.1–1.0)
Monocytes Absolute: 0.5 10*3/uL (ref 0.1–1.0)
Monocytes Relative: 11 %
Monocytes Relative: 12 %
Neutro Abs: 1.4 10*3/uL — ABNORMAL LOW (ref 1.7–7.7)
Neutro Abs: 1.4 10*3/uL — ABNORMAL LOW (ref 1.7–7.7)
Neutrophils Relative %: 31 %
Neutrophils Relative %: 31 %
Platelets: 130 10*3/uL — ABNORMAL LOW (ref 150–400)
Platelets: 131 10*3/uL — ABNORMAL LOW (ref 150–400)
RBC: 4.85 MIL/uL (ref 3.87–5.11)
RBC: 4.75 MIL/uL (ref 3.87–5.11)
RDW: 12.6 % (ref 11.5–15.5)
RDW: 12.7 % (ref 11.5–15.5)
WBC: 4.6 10*3/uL (ref 4.0–10.5)
WBC: 4.4 10*3/uL (ref 4.0–10.5)
nRBC: 0 % (ref 0.0–0.2)
nRBC: 0 % (ref 0.0–0.2)

## 2020-04-05 LAB — COMPREHENSIVE METABOLIC PANEL
ALT: 13 U/L (ref 0–44)
ALT: 12 U/L (ref 0–44)
AST: 18 U/L (ref 15–41)
AST: 20 U/L (ref 15–41)
Albumin: 4.2 g/dL (ref 3.5–5.0)
Albumin: 4.1 g/dL (ref 3.5–5.0)
Alkaline Phosphatase: 40 U/L (ref 38–126)
Alkaline Phosphatase: 38 U/L (ref 38–126)
Anion gap: 9 (ref 5–15)
Anion gap: 9 (ref 5–15)
BUN: 14 mg/dL (ref 8–23)
BUN: 14 mg/dL (ref 8–23)
CO2: 28 mmol/L (ref 22–32)
CO2: 25 mmol/L (ref 22–32)
Calcium: 9.1 mg/dL (ref 8.9–10.3)
Calcium: 8.9 mg/dL (ref 8.9–10.3)
Chloride: 102 mmol/L (ref 98–111)
Chloride: 103 mmol/L (ref 98–111)
Creatinine, Ser: 0.73 mg/dL (ref 0.44–1.00)
Creatinine, Ser: 0.67 mg/dL (ref 0.44–1.00)
GFR, Estimated: >60 mL/min (ref >60)
GFR, Estimated: >60 mL/min (ref >60)
Glucose, Bld: 168 mg/dL — ABNORMAL HIGH (ref 70–99)
Glucose, Bld: 161 mg/dL — ABNORMAL HIGH (ref 70–99)
Potassium: 4.9 mmol/L (ref 3.5–5.1)
Potassium: 4.6 mmol/L (ref 3.5–5.1)
Sodium: 139 mmol/L (ref 135–145)
Sodium: 137 mmol/L (ref 135–145)
Total Bilirubin: 0.7 mg/dL (ref 0.3–1.2)
Total Bilirubin: 0.8 mg/dL (ref 0.3–1.2)
Total Protein: 6.9 g/dL (ref 6.5–8.1)
Total Protein: 6.8 g/dL (ref 6.5–8.1)

## 2020-04-05 LAB — PROTIME-INR
INR: 1.1 (ref 0.8–1.2)
Prothrombin Time: 14.1 seconds (ref 11.4–15.2)

## 2020-04-05 LAB — VITAMIN D 25 HYDROXY (VIT D DEFICIENCY, FRACTURES): Vit D, 25-Hydroxy: 25.11 ng/mL — ABNORMAL LOW (ref 30–100)

## 2020-04-05 MED ORDER — DENOSUMAB 60 MG/ML ~~LOC~~ SOSY
60.0000 mg | PREFILLED_SYRINGE | Freq: Once | SUBCUTANEOUS | Status: AC
  Administered 2020-04-05: 60 mg via SUBCUTANEOUS
  Filled 2020-04-05: qty 1

## 2020-04-05 NOTE — Progress Notes (Signed)
Patient presented today for Prolia injection.  Confirmed patient is taking Calcium and Vitamin D daily. Ca level 9.1.  Administered to Right upper abdomen.  Tolerated well with no complaints.  Discharged ambulatory with family member in stable condition.

## 2020-04-14 ENCOUNTER — Other Ambulatory Visit: Payer: Self-pay | Admitting: Gastroenterology

## 2020-05-29 ENCOUNTER — Ambulatory Visit: Payer: Medicare Other | Admitting: Gastroenterology

## 2020-07-08 ENCOUNTER — Other Ambulatory Visit: Payer: Self-pay

## 2020-07-08 ENCOUNTER — Encounter: Payer: Self-pay | Admitting: Gastroenterology

## 2020-07-08 ENCOUNTER — Ambulatory Visit (INDEPENDENT_AMBULATORY_CARE_PROVIDER_SITE_OTHER): Payer: Medicare Other | Admitting: Gastroenterology

## 2020-07-08 VITALS — BP 141/92 | HR 85 | Temp 96.9°F | Ht 64.0 in | Wt 175.8 lb

## 2020-07-08 DIAGNOSIS — Z79899 Other long term (current) drug therapy: Secondary | ICD-10-CM

## 2020-07-08 DIAGNOSIS — K219 Gastro-esophageal reflux disease without esophagitis: Secondary | ICD-10-CM | POA: Diagnosis not present

## 2020-07-08 DIAGNOSIS — K7460 Unspecified cirrhosis of liver: Secondary | ICD-10-CM

## 2020-07-08 NOTE — Progress Notes (Signed)
Primary Care Physician: Zhou-Talbert, [name], MD  Primary Gastroenterologist:  Formerly Barney Drain, MD   Chief Complaint  Patient presents with   Cirrhosis    HPI: Laurie Moreno is a 70 y.o. female here for follow-up of cirrhosis suspected to be due to [place].  Last seen in July 2021.  She had an EGD and colonoscopy in June 2021.  She had mild portal hypertensive gastropathy, linear gastric erosions.  No varices.  Biopsy showed reactive gastropathy but no H. pylori.  Plans for repeat EGD in 2 years to screen for varices.  On colonoscopy she had 1.5 cm verrucous, raised nodule upper gluteal fold, scattered diverticula, 3 sessile tubular adenomas removed.  Plans for next colonoscopy in 5 years.  She was advised to see dermatology for the nodule noted near the upper end of the gluteal folds.  Patient with history of well compensated cirrhosis, MELD Na of 7 back in December.  She has received hepatitis B vaccinations.  She was immune to hepatitis A.  Hepatoma screening completed November 2021, no evidence of hepatoma on ultrasound.  She has gallstones.  Today: Trying to loose weight. Walking a lot more. Down about 8 pounds in the past one year. Recently found protein in urine, started lisinopril. Appetite good. No abdominal pain. No melena, brbpr. BM regular. On metamucil. No heartburn on pantoprazole. She has some minor lower extremity edema off/on.  No episodes of confusion.   Current Outpatient Medications  Medication Sig Dispense Refill   albuterol (VENTOLIN HFA) 108 (90 Base) MCG/ACT inhaler Inhale 2 puffs into the lungs as needed for wheezing or shortness of breath.     anastrozole (ARIMIDEX) 1 MG tablet TAKE 1 TABLET BY MOUTH EVERY DAY 90 tablet 3   aspirin EC 81 MG tablet Take 81 mg by mouth daily. Swallow whole.     atorvastatin (LIPITOR) 40 MG tablet Take 40 mg by mouth daily.      Calcium-Magnesium-Vitamin D (CALCIUM 1200+D3 PO) Take 1 tablet by mouth in the morning  and at bedtime.     carbidopa-levodopa (SINEMET IR) 25-100 MG tablet Take 1 tablet by mouth 3 (three) times daily.      carvedilol (COREG) 3.125 MG tablet Take 3.125 mg by mouth 2 (two) times daily with a meal.     clonazePAM (KLONOPIN) 0.5 MG tablet Take 1 tablet (0.5 mg total) by mouth 2 (two) times daily. (Patient taking differently: Take 0.5 mg by mouth at bedtime.) 60 tablet 0   Denosumab (PROLIA [place]) Inject into the skin every 6 (six) months.     divalproex (DEPAKOTE) 250 MG DR tablet Take 250 mg by mouth 3 (three) times daily. Do not crush     escitalopram (LEXAPRO) 20 MG tablet Take 20 mg by mouth daily.     gabapentin (NEURONTIN) 300 MG capsule Take 300 mg by mouth 2 (two) times daily.     hydrOXYzine (ATARAX/VISTARIL) 25 MG tablet Take 25 mg by mouth every 8 (eight) hours as needed for itching.      levothyroxine (SYNTHROID, LEVOTHROID) 125 MCG tablet Take 125 mcg by mouth daily before breakfast.     lisinopril (ZESTRIL) 2.5 MG tablet Take 2.5 mg by mouth daily.     loratadine (CLARITIN) 10 MG tablet Take 10 mg by mouth daily.     Multiple Vitamin (MULTIVITAMIN) tablet Take 1 tablet by mouth every morning.      pantoprazole (PROTONIX) 40 MG tablet TAKE 1 TABLET BY MOUTH DAILY  BEFORE BREAKFAST 90 tablet 3   primidone (MYSOLINE) 50 MG tablet Take 100 mg by mouth 2 (two) times daily.      No current facility-administered medications for this visit.    Allergies as of 07/08/2020 - Review Complete 07/08/2020  Allergen Reaction Noted   Codeine Nausea And Vomiting 04/02/2011   Penicillins Hives, Rash, and Other (See Comments) 04/02/2011    ROS:  General: Negative for anorexia, unintentional weight loss, fever, chills, fatigue, weakness. ENT: Negative for hoarseness, difficulty swallowing , nasal congestion. CV: Negative for chest pain, angina, palpitations, dyspnea on exertion, mild intermittent peripheral edema.  Respiratory: Negative for dyspnea at rest, dyspnea on  exertion, cough, sputum, wheezing.  GI: See history of present illness. GU:  Negative for dysuria, hematuria, urinary incontinence, urinary frequency, nocturnal urination.  Endo: Negative for unusual weight change.    Physical Examination:   BP (!) 141/92    Pulse 85    Temp (!) 96.9 F (36.1 C) (Temporal)    Ht 5\' 4"  (1.626 m)    Wt 175 lb 12.8 oz (79.7 kg)    BMI 30.18 kg/m   General: Well-nourished, well-developed in no acute distress.  Eyes: No icterus. Mouth: masked Lungs: Clear to auscultation bilaterally.  Heart: Regular rate and rhythm, no murmurs rubs or gallops.  Abdomen: Bowel sounds are normal, nontender, nondistended, no hepatosplenomegaly or masses, no abdominal bruits or hernia , no rebound or guarding.   Extremities: trace bilateral lower extremity edema. No clubbing or deformities. Neuro: Alert and oriented x 4   Skin: Warm and dry, no jaundice.   Psych: Alert and cooperative, normal mood and affect.  Labs:  Lab Results  Component Value Date   CREATININE 0.67 04/05/2020   BUN 14 04/05/2020   NA 137 04/05/2020   K 4.6 04/05/2020   CL 103 04/05/2020   CO2 25 04/05/2020   Lab Results  Component Value Date   ALT 12 04/05/2020   AST 20 04/05/2020   ALKPHOS 38 04/05/2020   BILITOT 0.8 04/05/2020   Lab Results  Component Value Date   WBC 4.4 04/05/2020   HGB 15.2 (H) 04/05/2020   HCT 44.9 04/05/2020   MCV 94.5 04/05/2020   PLT 131 (L) 04/05/2020   Lab Results  Component Value Date   INR 1.1 04/05/2020   INR 1.1 10/09/2019   INR 1.1 12/23/2018     Imaging Studies: No results found.  Assessment:  70 year old female presenting for follow-up of well compensated [place] cirrhosis.  Currently up-to-date on variceal screening, hepatoma screening and labs.  Most recent MELD Na 7.  Clinically doing well.  She has very mild peripheral edema in the lower extremities noted today i.e. trace.  Currently not on a diuretic.  Recently noted to have protein in the  urine and low-dose lisinopril started by PCP.  We will continue to monitor for worsening edema for now.  Plan: 1. We will update liver labs, right upper quadrant ultrasound in June 2022. 2. 2 g sodium restricted diet.  Handout provided.  Call if she develops persistent lower extremity edema, weight gain. 3. Return to the office in December 2022 or call sooner if needed.

## 2020-07-08 NOTE — Patient Instructions (Signed)
1. We will plan on labs and ultrasound of your liver in June 2022. 2. Return to the office in December 2022. 3. Watch sodium intake.  Limit to 2000 mg/day.  Too much salt will increase chances of fluid/swelling.  Two Gram Sodium Diet 2000 mg  What is Sodium? Sodium is a mineral found naturally in many foods. The most significant source of sodium in the diet is table salt, which is about 40% sodium.  Processed, convenience, and preserved foods also contain a large amount of sodium.  The body needs only 500 mg of sodium daily to function,  A normal diet provides more than enough sodium even if you do not use salt.  Why Limit Sodium? A build up of sodium in the body can cause thirst, increased blood pressure, shortness of breath, and water retention.  Decreasing sodium in the diet can reduce edema and risk of heart attack or stroke associated with high blood pressure.  Keep in mind that there are many other factors involved in these health problems.  Heredity, obesity, lack of exercise, cigarette smoking, stress and what you eat all play a role.  General Guidelines:  Do not add salt at the table or in cooking.  One teaspoon of salt contains over 2 grams of sodium.  Read food labels  Avoid processed and convenience foods  Ask your dietitian before eating any foods not dicussed in the menu planning guidelines  Consult your physician if you wish to use a salt substitute or a sodium containing medication such as antacids.  Limit milk and milk products to 16 oz (2 cups) per day.  Shopping Hints:  READ LABELS!! "Dietetic" does not necessarily mean low sodium.  Salt and other sodium ingredients are often added to foods during processing.   Menu Planning Guidelines Food Group Choose More Often Avoid  Beverages (see also the milk group All fruit juices, low-sodium, salt-free vegetables juices, low-sodium carbonated beverages Regular vegetable or tomato juices, commercially softened water used for  drinking or cooking  Breads and Cereals Enriched white, wheat, rye and pumpernickel bread, hard rolls and dinner rolls; muffins, cornbread and waffles; most dry cereals, cooked cereal without added salt; unsalted crackers and breadsticks; low sodium or homemade bread crumbs Bread, rolls and crackers with salted tops; quick breads; instant hot cereals; pancakes; commercial bread stuffing; self-rising flower and biscuit mixes; regular bread crumbs or cracker crumbs  Desserts and Sweets Desserts and sweets mad with mild should be within allowance Instant pudding mixes and cake mixes  Fats Butter or margarine; vegetable oils; unsalted salad dressings, regular salad dressings limited to 1 Tbs; light, sour and heavy cream Regular salad dressings containing bacon fat, bacon bits, and salt pork; snack dips made with instant soup mixes or processed cheese; salted nuts  Fruits Most fresh, frozen and canned fruits Fruits processed with salt or sodium-containing ingredient (some dried fruits are processed with sodium sulfites        Vegetables Fresh, frozen vegetables and low- sodium canned vegetables Regular canned vegetables, sauerkraut, pickled vegetables, and others prepared in brine; frozen vegetables in sauces; vegetables seasoned with ham, bacon or salt pork  Condiments, Sauces, Miscellaneous  Salt substitute with physician's approval; pepper, herbs, spices; vinegar, lemon or lime juice; hot pepper sauce; garlic powder, onion powder, low sodium soy sauce (1 Tbs.); low sodium condiments (ketchup, chili sauce, mustard) in limited amounts (1 tsp.) fresh ground horseradish; unsalted tortilla chips, pretzels, potato chips, popcorn, salsa (1/4 cup) Any seasoning made with salt including  garlic salt, celery salt, onion salt, and seasoned salt; sea salt, rock salt, kosher salt; meat tenderizers; monosodium glutamate; mustard, regular soy sauce, barbecue, sauce, chili sauce, teriyaki sauce, steak sauce,  Worcestershire sauce, and most flavored vinegars; canned gravy and mixes; regular condiments; salted snack foods, olives, picles, relish, horseradish sauce, catsup   Food preparation: Try these seasonings Meats:    Pork Sage, onion Serve with applesauce  Chicken Poultry seasoning, thyme, parsley Serve with cranberry sauce  Lamb Curry powder, rosemary, garlic, thyme Serve with mint sauce or jelly  Veal Marjoram, basil Serve with current jelly, cranberry sauce  Beef Pepper, bay leaf Serve with dry mustard, unsalted chive butter  Fish [place] leaf, dill Serve with unsalted lemon butter, unsalted parsley butter  Vegetables:    Asparagus Lemon juice   Broccoli Lemon juice   Carrots Mustard dressing parsley, mint, nutmeg, glazed with unsalted butter and sugar   Green beans Marjoram, lemon juice, nutmeg,dill seed   Tomatoes Basil, marjoram, onion   Spice /blend for [employer]" 4 tsp ground thyme 1 tsp ground sage 3 tsp ground rosemary 4 tsp ground marjoram   Test your knowledge 1. A product that says "Salt Free" may still contain sodium. True or False 2. Garlic Powder and Hot Pepper Sauce an be used as alternative seasonings.True or False 3. Processed foods have more sodium than fresh foods.  True or False 4. Canned Vegetables have less sodium than froze True or False  WAYS TO DECREASE YOUR SODIUM INTAKE 1. Avoid the use of added salt in cooking and at the table.  Table salt (and other prepared seasonings which contain salt) is probably one of the greatest sources of sodium in the diet.  Unsalted foods can gain flavor from the sweet, sour, and butter taste sensations of herbs and spices.  Instead of using salt for seasoning, try the following seasonings with the foods listed.  Remember: how you use them to enhance natural food flavors is limited only by your creativity... Allspice-Meat, fish, eggs, fruit, peas, red and yellow vegetables Almond Extract-Fruit baked goods Anise Seed-Sweet breads,  fruit, carrots, beets, cottage cheese, cookies (tastes like licorice) Basil-Meat, fish, eggs, vegetables, rice, vegetables salads, soups, sauces Bay Leaf-Meat, fish, stews, poultry Burnet-Salad, vegetables (cucumber-like flavor) Caraway Seed-Bread, cookies, cottage cheese, meat, vegetables, cheese, rice Cardamon-Baked goods, fruit, soups Celery Powder or seed-Salads, salad dressings, sauces, meatloaf, soup, bread.Do not use  celery salt Chervil-Meats, salads, fish, eggs, vegetables, cottage cheese (parsley-like flavor) Chili Power-Meatloaf, chicken cheese, corn, eggplant, egg dishes Chives-Salads cottage cheese, egg dishes, soups, vegetables, sauces Cilantro-Salsa, casseroles Cinnamon-Baked goods, fruit, pork, lamb, chicken, carrots Cloves-Fruit, baked goods, fish, pot roast, green beans, beets, carrots Coriander-Pastry, cookies, meat, salads, cheese (lemon-orange flavor) Cumin-Meatloaf, fish,cheese, eggs, cabbage,fruit pie (caraway flavor) [employer], fruit, eggs, fish, poultry, cottage cheese, vegetables Dill Seed-Meat, cottage cheese, poultry, vegetables, fish, salads, bread Fennel Seed-Bread, cookies, apples, pork, eggs, fish, beets, cabbage, cheese, Licorice-like flavor Garlic-(buds or powder) Salads, meat, poultry, fish, bread, butter, vegetables, potatoes.Do not  use garlic salt Ginger-Fruit, vegetables, baked goods, meat, fish, poultry Horseradish Root-Meet, vegetables, butter Lemon Juice or Extract-Vegetables, fruit, tea, baked goods, fish salads Mace-Baked goods fruit, vegetables, fish, poultry (taste like nutmeg) Maple Extract-Syrups Marjoram-Meat, chicken, fish, vegetables, breads, green salads (taste like Sage) Mint-Tea, lamb, sherbet, vegetables, desserts, carrots, cabbage Mustard, Dry or Seed-Cheese, eggs, meats, vegetables, poultry Nutmeg-Baked goods, fruit, chicken, eggs, vegetables, desserts Onion Powder-Meat, fish, poultry, vegetables, cheese, eggs, bread, rice  salads (Do not use   Onion  salt) Orange Extract-Desserts, baked goods Oregano-Pasta, eggs, cheese, onions, pork, lamb, fish, chicken, vegetables, green salads Paprika-Meat, fish, poultry, eggs, cheese, vegetables Parsley Flakes-Butter, vegetables, meat fish, poultry, eggs, bread, salads (certain forms may   Contain sodium Pepper-Meat fish, poultry, vegetables, eggs Peppermint Extract-Desserts, baked goods Poppy Seed-Eggs, bread, cheese, fruit dressings, baked goods, noodles, vegetables, cottage  [employer], poultry, meat, fish, cauliflower, turnips,eggs bread Saffron-Rice, bread, veal, chicken, fish, eggs Sage-Meat, fish, poultry, onions, eggplant, tomateos, pork, stews Savory-Eggs, salads, poultry, meat, rice, vegetables, soups, pork Tarragon-Meat, poultry, fish, eggs, butter, vegetables (licorice-like flavor)  Thyme-Meat, poultry, fish, eggs, vegetables, (clover-like flavor), sauces, soups Tumeric-Salads, butter, eggs, fish, rice, vegetables (saffron-like flavor) Vanilla Extract-Baked goods, candy Vinegar-Salads, vegetables, meat marinades Walnut Extract-baked goods, candy  2. Choose your Foods Wisely   The following is a list of foods to avoid which are high in sodium:  Meats-Avoid all smoked, canned, salt cured, dried and kosher meat and fish as well as Anchovies   [employer] meats:Bologna, Liverwurst, Pastrami Canned meat or fish  Marinated herring Caviar    Pepperoni Corned Beef   Pizza Dried chipped beef  Salami Frozen breaded fish or meat Salt pork Frankfurters or hot dogs  Sardines Gefilte fish   Sausage Ham (boiled ham, Proscuitto Smoked butt    spiced ham)   Spam      TV Dinners Vegetables Canned vegetables (Regular) Relish Canned mushrooms  Sauerkraut Olives    Tomato juice Pickles  Bakery and Dessert Products Canned puddings  Cream pies Cheesecake   Decorated cakes Cookies  Beverages/Juices Tomato  juice, regular  Gatorade   V-8 vegetable juice, regular  Breads and Cereals Biscuit mixes   Salted potato chips, corn chips, pretzels Bread stuffing mixes  Salted crackers and rolls Pancake and waffle mixes Self-rising flour  Seasonings Accent    Meat sauces Barbecue sauce  Meat tenderizer Catsup    Monosodium glutamate (MSG) Celery salt   Onion salt Chili sauce   Prepared mustard Garlic salt   Salt, seasoned salt, sea salt Gravy mixes   Soy sauce Horseradish   Steak sauce Ketchup   Tartar sauce Lite salt    Teriyaki sauce Marinade mixes   Worcestershire sauce  Others Baking powder   Cocoa and cocoa mixes Baking soda   Commercial casserole mixes Candy-caramels, chocolate  Dehydrated soups    Bars, fudge,nougats  Instant rice and pasta mixes Canned broth or soup  Maraschino cherries Cheese, aged and processed cheese and cheese spreads  Learning Assessment Quiz  Indicated T (for True) or F (for False) for each of the following statements:  1. _____ Fresh fruits and vegetables and unprocessed grains are generally low in sodium 2. _____ Water may contain a considerable amount of sodium, depending on the source 3. _____ You can always tell if a food is high in sodium by tasting it 4. _____ Certain laxatives my be high in sodium and should be avoided unless prescribed   by a physician or pharmacist 5. _____ Salt substitutes may be used freely by anyone on a sodium restricted diet 6. _____ Sodium is present in table salt, food additives and as a natural component of   most foods 7. _____ Table salt is approximately 90% sodium 8. _____ Limiting sodium intake may help prevent excess fluid accumulation in the body 9. _____ On a sodium-restricted diet, seasonings such as bouillon soy sauce, and    cooking wine should be used in place of table  salt 10. _____ On an ingredient list, a product which lists monosodium glutamate as the first   ingredient is an appropriate food to include on a  low sodium diet  Circle the best answer(s) to the following statements (Hint: there may be more than one correct answer)  11. On a low-sodium diet, some acceptable snack items are:    A. Olives  F. Bean dip   K. Grapefruit juice    B. Salted Pretzels G. Commercial Popcorn   L. Canned peaches    C. Carrot Sticks  H. Bouillon   M. Unsalted nuts   D. [place] fries  I. Peanut butter crackers N. Salami   E. Sweet pickles J. Tomato Juice   O. Pizza  12.  Seasonings that may be used freely on a reduced - sodium diet include   A. Lemon wedges F.Monosodium glutamate K. Celery seed    B.Soysauce   G. Pepper   L. Mustard powder   C. Sea salt  H. Cooking wine  M. Onion flakes   D. Vinegar  E. Prepared horseradish N. Salsa   E. Sage   J. Worcestershire sauce  O. Chutney

## 2020-07-09 ENCOUNTER — Encounter: Payer: Self-pay | Admitting: Gastroenterology

## 2020-09-11 ENCOUNTER — Other Ambulatory Visit: Payer: Self-pay

## 2020-09-11 DIAGNOSIS — Z79899 Other long term (current) drug therapy: Secondary | ICD-10-CM

## 2020-09-11 DIAGNOSIS — K7460 Unspecified cirrhosis of liver: Secondary | ICD-10-CM

## 2020-10-23 ENCOUNTER — Other Ambulatory Visit (HOSPITAL_COMMUNITY): Payer: Medicare Other

## 2020-10-23 ENCOUNTER — Other Ambulatory Visit (HOSPITAL_COMMUNITY): Payer: Self-pay

## 2020-10-23 DIAGNOSIS — Z170 Estrogen receptor positive status [ER+]: Secondary | ICD-10-CM

## 2020-10-23 DIAGNOSIS — C50212 Malignant neoplasm of upper-inner quadrant of left female breast: Secondary | ICD-10-CM

## 2020-10-24 ENCOUNTER — Inpatient Hospital Stay (HOSPITAL_COMMUNITY): Payer: Medicare Other

## 2020-10-24 ENCOUNTER — Ambulatory Visit (HOSPITAL_COMMUNITY): Payer: Medicare Other

## 2020-10-24 ENCOUNTER — Inpatient Hospital Stay (HOSPITAL_COMMUNITY): Payer: Medicare Other | Attending: Hematology

## 2020-10-24 ENCOUNTER — Other Ambulatory Visit (HOSPITAL_COMMUNITY)
Admission: RE | Admit: 2020-10-24 | Discharge: 2020-10-24 | Disposition: A | Discharge status: Home / Self Care | Payer: Medicare Other | Source: Ambulatory Visit | Attending: Gastroenterology | Admitting: Gastroenterology

## 2020-10-24 ENCOUNTER — Other Ambulatory Visit: Payer: Self-pay

## 2020-10-24 VITALS — BP 141/82 | HR 88 | Temp 98.2°F | Resp 18

## 2020-10-24 DIAGNOSIS — D696 Thrombocytopenia, unspecified: Secondary | ICD-10-CM | POA: Insufficient documentation

## 2020-10-24 DIAGNOSIS — Z170 Estrogen receptor positive status [ER+]: Secondary | ICD-10-CM | POA: Insufficient documentation

## 2020-10-24 DIAGNOSIS — E559 Vitamin D deficiency, unspecified: Secondary | ICD-10-CM | POA: Insufficient documentation

## 2020-10-24 DIAGNOSIS — F17210 Nicotine dependence, cigarettes, uncomplicated: Secondary | ICD-10-CM | POA: Insufficient documentation

## 2020-10-24 DIAGNOSIS — C50919 Malignant neoplasm of unspecified site of unspecified female breast: Secondary | ICD-10-CM | POA: Diagnosis not present

## 2020-10-24 DIAGNOSIS — Z79811 Long term (current) use of aromatase inhibitors: Secondary | ICD-10-CM | POA: Diagnosis not present

## 2020-10-24 DIAGNOSIS — K7460 Unspecified cirrhosis of liver: Secondary | ICD-10-CM | POA: Insufficient documentation

## 2020-10-24 DIAGNOSIS — Z79899 Other long term (current) drug therapy: Secondary | ICD-10-CM | POA: Diagnosis present

## 2020-10-24 DIAGNOSIS — M8580 Other specified disorders of bone density and structure, unspecified site: Secondary | ICD-10-CM

## 2020-10-24 DIAGNOSIS — C50212 Malignant neoplasm of upper-inner quadrant of left female breast: Secondary | ICD-10-CM

## 2020-10-24 LAB — COMPREHENSIVE METABOLIC PANEL
ALT: 8 U/L (ref 0–44)
AST: 22 U/L (ref 15–41)
Albumin: 4.3 g/dL (ref 3.5–5.0)
Alkaline Phosphatase: 40 U/L (ref 38–126)
Anion gap: 10 (ref 5–15)
BUN: 14 mg/dL (ref 8–23)
CO2: 25 mmol/L (ref 22–32)
Calcium: 9.1 mg/dL (ref 8.9–10.3)
Chloride: 100 mmol/L (ref 98–111)
Creatinine, Ser: 0.71 mg/dL (ref 0.44–1.00)
GFR, Estimated: >60 mL/min (ref >60)
Glucose, Bld: 103 mg/dL — ABNORMAL HIGH (ref 70–99)
Potassium: 4.5 mmol/L (ref 3.5–5.1)
Sodium: 135 mmol/L (ref 135–145)
Total Bilirubin: 1 mg/dL (ref 0.3–1.2)
Total Protein: 6.7 g/dL (ref 6.5–8.1)

## 2020-10-24 LAB — CBC WITH DIFFERENTIAL/PLATELET
Abs Immature Granulocytes: 0.01 10*3/uL (ref 0.00–0.07)
Basophils Absolute: 0.1 10*3/uL (ref 0.0–0.1)
Basophils Relative: 1 %
Eosinophils Absolute: 0.2 10*3/uL (ref 0.0–0.5)
Eosinophils Relative: 3 %
HCT: 43.9 % (ref 36.0–46.0)
Hemoglobin: 14.9 g/dL (ref 12.0–15.0)
Immature Granulocytes: 0 %
Lymphocytes Relative: 56 %
Lymphs Abs: 4.1 10*3/uL — ABNORMAL HIGH (ref 0.7–4.0)
MCH: 31.5 pg (ref 26.0–34.0)
MCHC: 33.9 g/dL (ref 30.0–36.0)
MCV: 92.8 fL (ref 80.0–100.0)
Monocytes Absolute: 0.7 10*3/uL (ref 0.1–1.0)
Monocytes Relative: 9 %
Neutro Abs: 2.3 10*3/uL (ref 1.7–7.7)
Neutrophils Relative %: 31 %
Platelets: 135 10*3/uL — ABNORMAL LOW (ref 150–400)
RBC: 4.73 MIL/uL (ref 3.87–5.11)
RDW: 12.2 % (ref 11.5–15.5)
WBC: 7.3 10*3/uL (ref 4.0–10.5)
nRBC: 0 % (ref 0.0–0.2)

## 2020-10-24 LAB — PROTIME-INR
INR: 1.1 (ref 0.8–1.2)
Prothrombin Time: 14.6 seconds (ref 11.4–15.2)

## 2020-10-24 LAB — VITAMIN D 25 HYDROXY (VIT D DEFICIENCY, FRACTURES): Vit D, 25-Hydroxy: 29.88 ng/mL — ABNORMAL LOW (ref 30–100)

## 2020-10-24 MED ORDER — DENOSUMAB 60 MG/ML ~~LOC~~ SOSY
60.0000 mg | PREFILLED_SYRINGE | Freq: Once | SUBCUTANEOUS | Status: AC
  Administered 2020-10-24: 60 mg via SUBCUTANEOUS

## 2020-10-24 MED ORDER — DENOSUMAB 60 MG/ML ~~LOC~~ SOSY
PREFILLED_SYRINGE | SUBCUTANEOUS | Status: AC
  Filled 2020-10-24: qty 1

## 2020-10-24 NOTE — Progress Notes (Signed)
Patient presents today for Prolia injection per MD orders.  Calcium noted to be 9.1, no dental work and no jaw pain per patient.   Stable during Prolia administration without incident; injection site WNL; see MAR for injection details.  Patient tolerated procedure well and without incident.  No questions or complaints noted at this time.  Discharge from clinic ambulatory in stable condition.  Alert and oriented X 3.  Follow up with [facility] as scheduled.

## 2020-10-24 NOTE — Patient Instructions (Signed)
[facility]  Discharge Instructions: Thank you for choosing [facility] to provide your oncology and hematology care.  If you have a lab appointment with the [facility], please come in thru the Main Entrance and check in at the main information desk.  Wear comfortable clothing and clothing appropriate for easy access to any Portacath or PICC line.   We strive to give you quality time with your provider. You may need to reschedule your appointment if you arrive late (15 or more minutes).  Arriving late affects you and other patients whose appointments are after yours.  Also, if you miss three or more appointments without notifying the office, you may be dismissed from the clinic at the provider's discretion.      For prescription refill requests, have your pharmacy contact our office and allow 72 hours for refills to be completed.    Today you received the following chemotherapy and/or immunotherapy agents Prolia injection      To help prevent nausea and vomiting after your treatment, we encourage you to take your nausea medication as directed.  BELOW ARE SYMPTOMS THAT SHOULD BE REPORTED IMMEDIATELY: *FEVER GREATER THAN 100.4 F (38 C) OR HIGHER *CHILLS OR SWEATING *NAUSEA AND VOMITING THAT IS NOT CONTROLLED WITH YOUR NAUSEA MEDICATION *UNUSUAL SHORTNESS OF BREATH *UNUSUAL BRUISING OR BLEEDING *URINARY PROBLEMS (pain or burning when urinating, or frequent urination) *BOWEL PROBLEMS (unusual diarrhea, constipation, pain near the anus) TENDERNESS IN MOUTH AND THROAT WITH OR WITHOUT PRESENCE OF ULCERS (sore throat, sores in mouth, or a toothache) UNUSUAL RASH, SWELLING OR PAIN  UNUSUAL VAGINAL DISCHARGE OR ITCHING   Items with * indicate a potential emergency and should be followed up as soon as possible or go to the Emergency Department if any problems should occur.  Please show the CHEMOTHERAPY ALERT CARD or IMMUNOTHERAPY ALERT CARD at check-in to the Emergency  Department and triage nurse.  Should you have questions after your visit or need to cancel or reschedule your appointment, please contact [facility] [phone]  and follow the prompts.  Office hours are 8:00 a.m. to 4:30 p.m. Monday - Friday. Please note that voicemails left after 4:00 p.m. may not be returned until the following business day.  We are closed weekends and major holidays. You have access to a nurse at all times for urgent questions. Please call the main number to the clinic [phone] and follow the prompts.  For any non-urgent questions, you may also contact your provider using MyChart. We now offer e-Visits for anyone 70 and older to request care online for non-urgent symptoms. For details visit [email or web address].   Also download the MyChart app! Go to the app store, search "MyChart", open the app, select [facility], and log in with your MyChart username and password.  Due to Covid, a mask is required upon entering the hospital/clinic. If you do not have a mask, one will be given to you upon arrival. For doctor visits, patients may have 1 support person aged 90 or older with them. For treatment visits, patients cannot have anyone with them due to current Covid guidelines and our immunocompromised population.

## 2020-10-25 LAB — AFP TUMOR MARKER: AFP, Serum, Tumor Marker: 1.2 ng/mL (ref 0.0–9.2)

## 2020-10-28 ENCOUNTER — Ambulatory Visit (HOSPITAL_COMMUNITY)
Admission: RE | Admit: 2020-10-28 | Discharge: 2020-10-28 | Disposition: A | Discharge status: Home / Self Care | Payer: Medicare Other | Source: Ambulatory Visit | Attending: Oncology | Admitting: Oncology

## 2020-10-28 ENCOUNTER — Other Ambulatory Visit: Payer: Self-pay

## 2020-10-28 DIAGNOSIS — Z170 Estrogen receptor positive status [ER+]: Secondary | ICD-10-CM | POA: Diagnosis not present

## 2020-10-28 DIAGNOSIS — C50212 Malignant neoplasm of upper-inner quadrant of left female breast: Secondary | ICD-10-CM | POA: Diagnosis not present

## 2020-10-28 DIAGNOSIS — Z1231 Encounter for screening mammogram for malignant neoplasm of breast: Secondary | ICD-10-CM | POA: Insufficient documentation

## 2020-10-29 ENCOUNTER — Other Ambulatory Visit: Payer: Self-pay | Admitting: *Deleted

## 2020-10-29 DIAGNOSIS — K7460 Unspecified cirrhosis of liver: Secondary | ICD-10-CM

## 2020-10-30 ENCOUNTER — Other Ambulatory Visit: Payer: Self-pay

## 2020-10-30 ENCOUNTER — Ambulatory Visit (HOSPITAL_COMMUNITY): Payer: Medicare Other | Admitting: Hematology and Oncology

## 2020-10-30 ENCOUNTER — Encounter (HOSPITAL_COMMUNITY): Payer: Self-pay | Admitting: Hematology and Oncology

## 2020-10-30 ENCOUNTER — Inpatient Hospital Stay (HOSPITAL_BASED_OUTPATIENT_CLINIC_OR_DEPARTMENT_OTHER): Payer: Medicare Other | Admitting: Hematology and Oncology

## 2020-10-30 DIAGNOSIS — F17210 Nicotine dependence, cigarettes, uncomplicated: Secondary | ICD-10-CM | POA: Diagnosis not present

## 2020-10-30 DIAGNOSIS — C50212 Malignant neoplasm of upper-inner quadrant of left female breast: Secondary | ICD-10-CM | POA: Diagnosis not present

## 2020-10-30 DIAGNOSIS — E559 Vitamin D deficiency, unspecified: Secondary | ICD-10-CM | POA: Diagnosis not present

## 2020-10-30 DIAGNOSIS — C50919 Malignant neoplasm of unspecified site of unspecified female breast: Secondary | ICD-10-CM | POA: Diagnosis not present

## 2020-10-30 DIAGNOSIS — D696 Thrombocytopenia, unspecified: Secondary | ICD-10-CM | POA: Diagnosis not present

## 2020-10-30 DIAGNOSIS — Z170 Estrogen receptor positive status [ER+]: Secondary | ICD-10-CM

## 2020-10-30 NOTE — Assessment & Plan Note (Signed)
She has slight vitamin D deficiency I recommend she takes extra vitamin D supplement

## 2020-10-30 NOTE — Assessment & Plan Note (Signed)
We discussed the importance of nicotine cessation  

## 2020-10-30 NOTE — Assessment & Plan Note (Signed)
This is due to liver cirrhosis It is stable Observe closely for now

## 2020-10-30 NOTE — Assessment & Plan Note (Signed)
Clinically, she has no signs of recurrence Recent mammogram was unremarkable She will continue antiestrogen therapy for 5 years She will continue Xgeva every 6 months for 2 to 3 years She will return again in 6 months for further follow-up

## 2020-10-30 NOTE — Progress Notes (Signed)
[place] FOLLOW-UP progress notes  Patient Care Team: Zhou-Talbert, [name], MD as PCP - General (Family Medicine) Danie Binder, MD (Inactive) as Consulting Physician (Gastroenterology)  CHIEF COMPLAINTS/PURPOSE OF VISIT:  Breast cancer, for further follow-up  HISTORY OF PRESENTING ILLNESS:  [name] 70 y.o. female is seen today because her primary oncologist is not available The patient has history of breast cancer status postmastectomy and ongoing treatment with adjuvant antiestrogen therapy She has been receiving Xgeva every 6 months She denies any recent abnormal breast examination, palpable mass, abnormal breast appearance or nipple changes She has mild hot flashes from antiestrogen therapy but that is not debilitating Unfortunately, she continues to smoke  I reviewed the patient's records extensive and collaborated the history with the patient. Summary of her history is as follows: Oncology History  Breast cancer ([place])  11/28/2018 Initial Diagnosis   Breast cancer ([place])    02/23/2019 Cancer Staging   Staging form: Breast, AJCC 8th Edition - Clinical stage from 02/23/2019: Stage IA (cT1b, cN0(sn), cM0, G2, ER+, PR+, HER2-) - Signed by Derek Jack, MD on 02/23/2019      MEDICAL HISTORY:  Past Medical History:  Diagnosis Date   Acute pyelonephritis    Anxiety    Bipolar disorder, unspecified ([place])    From [place] form   CHF (congestive heart failure) ([place])    see LOV 06-25-16 epic care everywhere ; "sister reports that patient has a 'weak heart'  but they did testing and she hasnt had to go back since"   Chronic respiratory failure with hypoxia ([place])    Complication of anesthesia    reports with last surgery on 09-07-17 it took 3 hours for her to wake up and she was on the ventilator post operatively for an additional  8 hours    COPD (chronic obstructive pulmonary disease) ([place])    Dermatitis    Dermatitis    Dysrhythmia     Elevated temperature 10/26/2017   temp of 99.9 , denies cold/flu sx; reports she gets frwquent UTIs, patient sister accompanying today  reports she is going to Dr Gloriann Loan (surgeon) office today  for pre-op appt  at 2pm   Essential tremor    Family history of adverse reaction to anesthesia    sister gets nausea and vomiting    Fever    History of ESBL E. coli infection    10-26-17 this RN did not enter this ESBL inf hx; patient and sister deny hx of ESBL infection and there is no supporting lab work for ESBL infection in epic    Hyperlipidemia    Hypothyroidism    Thrombocytopenia ([place])    Unspecified hydronephrosis    Urinary incontinence     SURGICAL HISTORY: Past Surgical History:  Procedure Laterality Date   BIOPSY  10/12/2019   Procedure: BIOPSY;  Surgeon: Daneil Dolin, MD;  [facility];  Service: Endoscopy;;   CARDIAC CATHETERIZATION  07/15/2016   at [facility]   COLONOSCOPY  12/2013   Dr. Aaron [facility]elman C. [facility] at [facility], long tortuous colon, nonbleeding external hemorrhoids, questionable small subcentimeter benign/hyperplastic polyp in the ascending colon, not removed.  Advised to have repeat colonoscopy in 4 years.   COLONOSCOPY WITH PROPOFOL N/A 10/12/2019   Rourk: 1.5 cm verrucous, raised nodule upper end of her gluteal folds.  Scattered diverticula.  3 sessile polyps removed, tubular adenomas.  Next colonoscopy in 5 years.   [place]  Left 09/07/2017   Procedure: CYSTOSCOPY WITH RETROGRADE PYELOGRAM/URETERAL STENT PLACEMENT;  Surgeon: Lucas Mallow, MD;  Location: [facility];  Service: Urology;  Laterality: Left;   ESOPHAGOGASTRODUODENOSCOPY (EGD) WITH PROPOFOL N/A 10/12/2019   Rourk: Portal hypertensive gastropathy.  Linear gastric erosions.  No varices.  Biopsy showed reactive gastropathy.  No H. pylori.  Repeat EGD in 2 years to screen for varices.   EYE SURGERY  5 years ago    bilateral cataract extraction     eyelid lift Bilateral    LAPAROSCOPIC NEPHRECTOMY, HAND ASSISTED Left 11/01/2017   Procedure: LEFT HAND ASSISTED LAPAROSCOPIC NEPHRECTOMY;  Surgeon: Lucas Mallow, MD;  [facility];  Service: Urology;  Laterality: Left;   MASTECTOMY MODIFIED RADICAL Left 01/23/2019   Procedure: LEFT MODIFIED RADICAL MASTECTOMY;  Surgeon: Aviva Signs, MD;  Location: [facility];  Service: General;  Laterality: Left;   POLYPECTOMY  10/12/2019   Procedure: POLYPECTOMY;  Surgeon: Daneil Dolin, MD;  [facility];  Service: Endoscopy;;    SOCIAL HISTORY: Social History   Socioeconomic History   Marital status: Single    Spouse name: Not on file   Number of children: 1   Years of education: Not on file   Highest education level: Not on file  Occupational History   Occupation: disability  Tobacco Use   Smoking status: Some Days    Packs/day: 0.50    Years: 45.00    Pack years: 22.50    Types: Cigarettes   Smokeless tobacco: Never   Tobacco comments:    smokes 8 cigarettes per day 02/01/2020  Vaping Use   Vaping Use: Never used  Substance and Sexual Activity   Alcohol use: Not Currently    Comment: no prior history heavy etoh use   Drug use: Not Currently   Sexual activity: Not Currently  Other Topics Concern   Not on file  Social History Narrative   Not on file   Social Determinants of Health   Financial Resource Strain: Not on file  Food Insecurity: Not on file  Transportation Needs: Not on file  Physical Activity: Not on file  Stress: Not on file  Social Connections: Not on file  Intimate Partner Violence: Not on file    FAMILY HISTORY: Family History  Problem Relation Age of Onset   Colon cancer Mother        died at age 8   Colon cancer Father        died in his 65s   Tremor Father    Melanoma Brother    Colon cancer Paternal Grandmother        age 71   Colon cancer Paternal Grandfather        in his 41s   Liver cancer Maternal Grandfather        died age  83    ALLERGIES:  is allergic to codeine and penicillins.  MEDICATIONS:  Current Outpatient Medications  Medication Sig Dispense Refill   albuterol (VENTOLIN HFA) 108 (90 Base) MCG/ACT inhaler Inhale 2 puffs into the lungs as needed for wheezing or shortness of breath.     anastrozole (ARIMIDEX) 1 MG tablet TAKE 1 TABLET BY MOUTH EVERY DAY 90 tablet 3   aspirin EC 81 MG tablet Take 81 mg by mouth daily. Swallow whole.     atorvastatin (LIPITOR) 40 MG tablet Take 40 mg by mouth daily.      Calcium-Magnesium-Vitamin D (CALCIUM 1200+D3 PO) Take 1 tablet by mouth in  the morning and at bedtime.     carbidopa-levodopa (SINEMET IR) 25-100 MG tablet Take 1 tablet by mouth 3 (three) times daily.      carvedilol (COREG) 3.125 MG tablet Take 3.125 mg by mouth 2 (two) times daily with a meal.     clonazePAM (KLONOPIN) 0.5 MG tablet Take 1 tablet (0.5 mg total) by mouth 2 (two) times daily. (Patient taking differently: Take 0.5 mg by mouth at bedtime.) 60 tablet 0   Denosumab (PROLIA [place]) Inject into the skin every 6 (six) months.     divalproex (DEPAKOTE) 250 MG DR tablet Take 250 mg by mouth 3 (three) times daily. Do not crush     escitalopram (LEXAPRO) 20 MG tablet Take 20 mg by mouth daily.     gabapentin (NEURONTIN) 300 MG capsule Take 300 mg by mouth 2 (two) times daily.     hydrOXYzine (ATARAX/VISTARIL) 25 MG tablet Take 25 mg by mouth every 8 (eight) hours as needed for itching.      levothyroxine (SYNTHROID, LEVOTHROID) 125 MCG tablet Take 125 mcg by mouth daily before breakfast.     lisinopril (ZESTRIL) 2.5 MG tablet Take 2.5 mg by mouth daily.     loratadine (CLARITIN) 10 MG tablet Take 10 mg by mouth daily.     Multiple Vitamin (MULTIVITAMIN) tablet Take 1 tablet by mouth every morning.      pantoprazole (PROTONIX) 40 MG tablet TAKE 1 TABLET BY MOUTH DAILY BEFORE BREAKFAST 90 tablet 3   primidone (MYSOLINE) 50 MG tablet Take 100 mg by mouth 2 (two) times daily.      No current  facility-administered medications for this visit.    REVIEW OF SYSTEMS:   Constitutional: Denies fevers, chills or abnormal night sweats Eyes: Denies blurriness of vision, double vision or watery eyes Ears, nose, mouth, throat, and face: Denies mucositis or sore throat Respiratory: Denies cough, dyspnea or wheezes Cardiovascular: Denies palpitation, chest discomfort or lower extremity swelling Gastrointestinal:  Denies nausea, heartburn or change in bowel habits Skin: Denies abnormal skin rashes Lymphatics: Denies new lymphadenopathy or easy bruising Neurological:Denies numbness, tingling or new weaknesses Behavioral/Psych: Mood is stable, no new changes  All other systems were reviewed with the patient and are negative.  PHYSICAL EXAMINATION: ECOG PERFORMANCE STATUS: 1 - Symptomatic but completely ambulatory  Vitals:   10/30/20 1531  BP: 140/86  Pulse: 92  Resp: 18  Temp: 98.7 F (37.1 C)  SpO2: 92%   Filed Weights   10/30/20 1531  Weight: 171 lb 1.6 oz (77.6 kg)    GENERAL:alert, no distress and comfortable SKIN: skin color, texture, turgor are normal, no rashes or significant lesions EYES: normal, conjunctiva are pink and non-injected, sclera clear OROPHARYNX:no exudate, normal lips, buccal mucosa, and tongue  NECK: supple, thyroid normal size, non-tender, without nodularity LYMPH:  no palpable lymphadenopathy in the cervical, axillary or inguinal LUNGS: clear to auscultation and percussion with normal breathing effort HEART: regular rate & rhythm and no murmurs without lower extremity edema ABDOMEN:abdomen soft, non-tender and normal bowel sounds Musculoskeletal:no cyanosis of digits and no clubbing  PSYCH: alert & oriented x 3 with fluent speech NEURO: no focal motor/sensory deficits Well-healed mastectomy scar on the left without abnormalities.  Normal breast exam on the right  LABORATORY DATA:  I have reviewed the data as listed Lab Results  Component Value  Date   WBC 7.3 10/24/2020   HGB 14.9 10/24/2020   HCT 43.9 10/24/2020   MCV 92.8 10/24/2020   PLT 135 (  L) 10/24/2020   Recent Labs    04/05/20 0929 04/05/20 0941 10/24/20 1036  NA 139 137 135  K 4.9 4.6 4.5  CL 102 103 100  CO2 $Re'28 25 25  'qtV$ GLUCOSE 168* 161* 103*  BUN $Re'14 14 14  'HFg$ CREATININE 0.73 0.67 0.71  CALCIUM 9.1 8.9 9.1  GFRNONAA >60 >60 >60  PROT 6.9 6.8 6.7  ALBUMIN 4.2 4.1 4.3  AST $Re'18 20 22  'PCd$ ALT $R'13 12 8  'PI$ ALKPHOS 40 38 40  BILITOT 0.7 0.8 1.0    RADIOGRAPHIC STUDIES: I have personally reviewed the radiological images as listed and agreed with the findings in the report. MM 3D SCREEN BREAST UNI RIGHT  Result Date: 10/29/2020 CLINICAL DATA:  Screening. EXAM: DIGITAL SCREENING UNILATERAL RIGHT MAMMOGRAM WITH CAD AND TOMOSYNTHESIS TECHNIQUE: Right screening digital craniocaudal and mediolateral oblique mammograms were obtained. Right screening digital breast tomosynthesis was performed. The images were evaluated with computer-aided detection. COMPARISON:  Previous exam(s). ACR Breast Density Category b: There are scattered areas of fibroglandular density. FINDINGS: There are no findings suspicious for malignancy. IMPRESSION: No mammographic evidence of malignancy. A result letter of this screening mammogram will be mailed directly to the patient. RECOMMENDATION: Screening mammogram in one year. (Code:SM-B-01Y) BI-RADS CATEGORY  1: Negative. Electronically Signed   By: Everlean Alstrom M.D.   On: 10/29/2020 12:33    ASSESSMENT & PLAN:  Breast cancer ([place]) Clinically, she has no signs of recurrence Recent mammogram was unremarkable She will continue antiestrogen therapy for 5 years She will continue Xgeva every 6 months for 2 to 3 years She will return again in 6 months for further follow-up  Vitamin D deficiency She has slight vitamin D deficiency I recommend she takes extra vitamin D supplement  Thrombocytopenia ([place]) This is due to liver cirrhosis It is  stable Observe closely for now  Cigarette smoker We discussed the importance of nicotine cessation  No orders of the defined types were placed in this encounter.   All questions were answered. The patient knows to call the clinic with any problems, questions or concerns. The total time spent in the appointment was 20 minutes encounter with patients including review of chart and various tests results, discussions about plan of care and coordination of care plan   Heath Lark, MD 10/30/2020 3:45 PM

## 2020-11-01 ENCOUNTER — Other Ambulatory Visit (HOSPITAL_COMMUNITY): Payer: Self-pay

## 2020-11-01 DIAGNOSIS — Z170 Estrogen receptor positive status [ER+]: Secondary | ICD-10-CM

## 2020-11-01 DIAGNOSIS — C50212 Malignant neoplasm of upper-inner quadrant of left female breast: Secondary | ICD-10-CM

## 2020-11-01 DIAGNOSIS — E559 Vitamin D deficiency, unspecified: Secondary | ICD-10-CM

## 2020-11-05 ENCOUNTER — Ambulatory Visit (HOSPITAL_COMMUNITY)
Admission: RE | Admit: 2020-11-05 | Discharge: 2020-11-05 | Disposition: A | Discharge status: Home / Self Care | Payer: Medicare Other | Source: Ambulatory Visit | Attending: Gastroenterology | Admitting: Gastroenterology

## 2020-11-05 DIAGNOSIS — K7460 Unspecified cirrhosis of liver: Secondary | ICD-10-CM | POA: Diagnosis not present

## 2021-01-20 ENCOUNTER — Other Ambulatory Visit (HOSPITAL_COMMUNITY): Payer: Self-pay

## 2021-01-20 DIAGNOSIS — M85852 Other specified disorders of bone density and structure, left thigh: Secondary | ICD-10-CM

## 2021-01-20 DIAGNOSIS — C50212 Malignant neoplasm of upper-inner quadrant of left female breast: Secondary | ICD-10-CM

## 2021-01-20 NOTE — Progress Notes (Signed)
DEXA order placed per Dr. Delton Coombes

## 2021-02-01 ENCOUNTER — Other Ambulatory Visit (HOSPITAL_COMMUNITY): Payer: Self-pay | Admitting: Hematology

## 2021-02-01 DIAGNOSIS — Z170 Estrogen receptor positive status [ER+]: Secondary | ICD-10-CM

## 2021-02-01 DIAGNOSIS — C50212 Malignant neoplasm of upper-inner quadrant of left female breast: Secondary | ICD-10-CM

## 2021-02-02 ENCOUNTER — Encounter (HOSPITAL_COMMUNITY): Payer: Self-pay | Admitting: Hematology

## 2021-02-14 ENCOUNTER — Ambulatory Visit
Admission: EM | Admit: 2021-02-14 | Discharge: 2021-02-14 | Disposition: A | Discharge status: Home / Self Care | Payer: Medicare Other | Attending: Emergency Medicine | Admitting: Emergency Medicine

## 2021-02-14 ENCOUNTER — Other Ambulatory Visit: Payer: Self-pay

## 2021-02-14 ENCOUNTER — Encounter: Payer: Self-pay | Admitting: Emergency Medicine

## 2021-02-14 DIAGNOSIS — N390 Urinary tract infection, site not specified: Secondary | ICD-10-CM | POA: Insufficient documentation

## 2021-02-14 LAB — POCT URINALYSIS DIP (MANUAL ENTRY)
Glucose, UA: NEGATIVE mg/dL
Ketones, POC UA: NEGATIVE mg/dL
Nitrite, UA: POSITIVE — AB
Protein Ur, POC: >=300 mg/dL — AB
Spec Grav, UA: 1.015 (ref 1.010–1.025)
Urobilinogen, UA: 0.2 E.U./dL
pH, UA: 6 (ref 5.0–8.0)

## 2021-02-14 MED ORDER — CEFTRIAXONE SODIUM 1 G IJ SOLR
1.0000 g | Freq: Once | INTRAMUSCULAR | Status: AC
  Administered 2021-02-14: 1 g via INTRAMUSCULAR

## 2021-02-14 MED ORDER — CEPHALEXIN 500 MG PO CAPS: 500.0000 mg | ORAL_CAPSULE | Freq: Four times a day (QID) | ORAL | 0 refills | Status: AC

## 2021-02-14 NOTE — Discharge Instructions (Addendum)
Push plenty of fluids, especially water.  May take Tylenol as needed for pain.  Finish the Keflex, even if you feel better.  Go immediately to the ER for any signs of an allergic reaction, fevers above 100.4, severe back pain, vomiting, altered mental status

## 2021-02-14 NOTE — ED Triage Notes (Signed)
Pt is present today with hematuria, abdominal pain, dysuria, lower back pain, and urinary frequency. Pt sx started yesterday

## 2021-02-14 NOTE — ED Provider Notes (Signed)
HPI  SUBJECTIVE:  Laurie Moreno is a 70 y.o. female who presents with constant, sharp low midline abdominal pain that radiates along her right flank into her right back starting yesterday.  She reports dysuria, urgency, frequency, cloudy and odorous urine, and hematuria starting today.  She is eating and drinking well.  No nausea, vomiting, fevers, vaginal complaints, altered mental status.  No antibiotics in the past month.  No antipyretic in the past 6 hours.  She has tried Tylenol with improvement in her symptoms.  No aggravating factors.   Patient has an extensive medical history including pyelonephritis, UVJ obstruction, renal abscess, CHF, COPD, hyperlipidemia, ESBL E. coli UTI, cirrhosis, breast cancer, status post left nephrectomy.  Patient had 2 urine cultures in 2019 that grew E. coli UTI that was resistant to everything except Macrobid.  She has had no UTIs or urine culture since.  No history of nephrolithiasis, chronic kidney disease.  PMD: [facility].  Urology: Dr. Gloriann Loan.  Additional history obtained from family member.  Past Medical History:  Diagnosis Date   Acute pyelonephritis    Anxiety    Bipolar disorder, unspecified ([place])    From [place] form   CHF (congestive heart failure) ([place])    see LOV 06-25-16 epic care everywhere ; "sister reports that patient has a 'weak heart'  but they did testing and she hasnt had to go back since"   Chronic respiratory failure with hypoxia ([place])    Complication of anesthesia    reports with last surgery on 09-07-17 it took 3 hours for her to wake up and she was on the ventilator post operatively for an additional  8 hours    COPD (chronic obstructive pulmonary disease) ([place])    Dermatitis    Dermatitis    Dysrhythmia    Elevated temperature 10/26/2017   temp of 99.9 , denies cold/flu sx; reports she gets frwquent UTIs, patient sister accompanying today  reports she is going to Dr Gloriann Loan (surgeon) office today  for pre-op appt   at 2pm   Essential tremor    Family history of adverse reaction to anesthesia    sister gets nausea and vomiting    Fever    History of ESBL E. coli infection    10-26-17 this RN did not enter this ESBL inf hx; patient and sister deny hx of ESBL infection and there is no supporting lab work for ESBL infection in epic    Hyperlipidemia    Hypothyroidism    Thrombocytopenia ([place])    Unspecified hydronephrosis    Urinary incontinence     Past Surgical History:  Procedure Laterality Date   BIOPSY  10/12/2019   Procedure: BIOPSY;  Surgeon: Daneil Dolin, MD;  [facility];  Service: Endoscopy;;   CARDIAC CATHETERIZATION  07/15/2016   at [facility]   COLONOSCOPY  12/2013   Dr. Aaron [facility]elman C. Lewis at [facility], long tortuous colon, nonbleeding external hemorrhoids, questionable small subcentimeter benign/hyperplastic polyp in the ascending colon, not removed.  Advised to have repeat colonoscopy in 4 years.   COLONOSCOPY WITH PROPOFOL N/A 10/12/2019   Rourk: 1.5 cm verrucous, raised nodule upper end of her gluteal folds.  Scattered diverticula.  3 sessile polyps removed, tubular adenomas.  Next colonoscopy in 5 years.   CYSTOSCOPY W/ URETERAL STENT PLACEMENT Left 09/07/2017   Procedure: CYSTOSCOPY WITH RETROGRADE PYELOGRAM/URETERAL STENT PLACEMENT;  Surgeon: Lucas Mallow, MD;  [facility];  Service: Urology;  Laterality: Left;   ESOPHAGOGASTRODUODENOSCOPY (EGD) WITH PROPOFOL N/A 10/12/2019   Rourk: Portal hypertensive gastropathy.  Linear gastric erosions.  No varices.  Biopsy showed reactive gastropathy.  No H. pylori.  Repeat EGD in 2 years to screen for varices.   EYE SURGERY  5 years ago    bilateral cataract extraction    eyelid lift Bilateral    LAPAROSCOPIC NEPHRECTOMY, HAND ASSISTED Left 11/01/2017   Procedure: LEFT HAND ASSISTED LAPAROSCOPIC NEPHRECTOMY;  Surgeon: Lucas Mallow, MD;  [facility];  Service: Urology;  Laterality: Left;    MASTECTOMY MODIFIED RADICAL Left 01/23/2019   Procedure: LEFT MODIFIED RADICAL MASTECTOMY;  Surgeon: Aviva Signs, MD;  Location: [facility];  Service: General;  Laterality: Left;   POLYPECTOMY  10/12/2019   Procedure: POLYPECTOMY;  Surgeon: Daneil Dolin, MD;  [facility];  Service: Endoscopy;;    Family History  Problem Relation Age of Onset   Colon cancer Mother        died at age 52   Colon cancer Father        died in his 22s   Tremor Father    Melanoma Brother    Colon cancer Paternal Grandmother        age 29   Colon cancer Paternal Grandfather        in his 26s   Liver cancer Maternal Grandfather        died age 68    Social History   Tobacco Use   Smoking status: Some Days    Packs/day: 0.50    Years: 45.00    Pack years: 22.50    Types: Cigarettes   Smokeless tobacco: Never   Tobacco comments:    smokes 8 cigarettes per day 02/01/2020  Vaping Use   Vaping Use: Never used  Substance Use Topics   Alcohol use: Not Currently    Comment: no prior history heavy etoh use   Drug use: Not Currently     Current Facility-Administered Medications:    cefTRIAXone (ROCEPHIN) injection 1 g, 1 g, Intramuscular, Once, Melynda Ripple, MD  Current Outpatient Medications:    cephALEXin (KEFLEX) 500 MG capsule, Take 1 capsule (500 mg total) by mouth 4 (four) times daily for 10 days., Disp: 40 capsule, Rfl: 0   albuterol (VENTOLIN HFA) 108 (90 Base) MCG/ACT inhaler, Inhale 2 puffs into the lungs as needed for wheezing or shortness of breath., Disp: , Rfl:    anastrozole (ARIMIDEX) 1 MG tablet, TAKE 1 TABLET BY MOUTH EVERY DAY, Disp: 90 tablet, Rfl: 3   aspirin EC 81 MG tablet, Take 81 mg by mouth daily. Swallow whole., Disp: , Rfl:    atorvastatin (LIPITOR) 40 MG tablet, Take 40 mg by mouth daily. , Disp: , Rfl:    Calcium-Magnesium-Vitamin D (CALCIUM 1200+D3 PO), Take 1 tablet by mouth in the morning and at bedtime., Disp: , Rfl:    carbidopa-levodopa (SINEMET  IR) 25-100 MG tablet, Take 1 tablet by mouth 3 (three) times daily. , Disp: , Rfl:    carvedilol (COREG) 3.125 MG tablet, Take 3.125 mg by mouth 2 (two) times daily with a meal., Disp: , Rfl:    clonazePAM (KLONOPIN) 0.5 MG tablet, Take 1 tablet (0.5 mg total) by mouth 2 (two) times daily. (Patient taking differently: Take 0.5 mg by mouth at bedtime.), Disp: 60 tablet, Rfl: 0   Denosumab (PROLIA [place]), Inject into the skin every 6 (six) months., Disp: , Rfl:    divalproex (DEPAKOTE) 250  MG DR tablet, Take 250 mg by mouth 3 (three) times daily. Do not crush, Disp: , Rfl:    escitalopram (LEXAPRO) 20 MG tablet, Take 20 mg by mouth daily., Disp: , Rfl:    gabapentin (NEURONTIN) 300 MG capsule, Take 300 mg by mouth 2 (two) times daily., Disp: , Rfl:    hydrOXYzine (ATARAX/VISTARIL) 25 MG tablet, Take 25 mg by mouth every 8 (eight) hours as needed for itching. , Disp: , Rfl:    levothyroxine (SYNTHROID, LEVOTHROID) 125 MCG tablet, Take 125 mcg by mouth daily before breakfast., Disp: , Rfl:    lisinopril (ZESTRIL) 2.5 MG tablet, Take 2.5 mg by mouth daily., Disp: , Rfl:    loratadine (CLARITIN) 10 MG tablet, Take 10 mg by mouth daily., Disp: , Rfl:    Multiple Vitamin (MULTIVITAMIN) tablet, Take 1 tablet by mouth every morning. , Disp: , Rfl:    pantoprazole (PROTONIX) 40 MG tablet, TAKE 1 TABLET BY MOUTH DAILY BEFORE BREAKFAST, Disp: 90 tablet, Rfl: 3   primidone (MYSOLINE) 50 MG tablet, Take 100 mg by mouth 2 (two) times daily. , Disp: , Rfl:   Allergies  Allergen Reactions   Codeine Nausea And Vomiting    In cough syrup cause nausea and vomiting   Penicillins Hives, Rash and Other (See Comments)    Did it involve swelling of the face/tongue/throat, SOB, or low BP? Unknown Did it involve sudden or severe rash/hives, skin peeling, or any reaction on the inside of your mouth or nose? Unknown Did you need to seek medical attention at a hospital or doctor's office? Unknown When did it last happen?    as  a child If all above answers are "NO", may proceed with cephalosporin use.      ROS  As noted in HPI.   Physical Exam  BP 119/82   Pulse 97   Temp 98.5 F (36.9 C)   SpO2 94%   Constitutional: Well developed, well nourished, no acute distress Eyes:  EOMI, conjunctiva normal bilaterally HENT: Normocephalic, atraumatic,mucus membranes moist Respiratory: Normal inspiratory effort Cardiovascular: Normal rate GI: nondistended.  Positive suprapubic, right flank tenderness Back: Very mild right CVA tenderness. skin: No rash, skin intact Musculoskeletal: no deformities Neurologic: Alert & oriented x 3, no focal neuro deficits Psychiatric: Speech and behavior appropriate.  At baseline mental status per family member.   ED Course   Medications  cefTRIAXone (ROCEPHIN) injection 1 g (has no administration in time range)    Orders Placed This Encounter  Procedures   Urine Culture    Standing Status:   Standing    Number of Occurrences:   1    Order Specific Question:   Indication    Answer:   Acute gross hematuria   POCT urinalysis dipstick    Standing Status:   Standing    Number of Occurrences:   1    Results for orders placed or performed during the hospital encounter of 02/14/21 (from the past 24 hour(s))  POCT urinalysis dipstick     Status: Abnormal   Collection Time: 02/14/21  2:50 PM  Result Value Ref Range   Color, UA red (A) yellow   Clarity, UA clear clear   Glucose, UA negative negative mg/dL   Bilirubin, UA small (A) negative   Ketones, POC UA negative negative mg/dL   Spec Grav, UA 1.015 1.010 - 1.025   Blood, UA large (A) negative   pH, UA 6.0 5.0 - 8.0   Protein Ur, POC >=300 (  A) negative mg/dL   Urobilinogen, UA 0.2 0.2 or 1.0 E.U./dL   Nitrite, UA Positive (A) Negative   Leukocytes, UA Small (1+) (A) Negative   No results found.  ED Clinical Impression  1. Complicated UTI (urinary tract infection)      ED Assessment/Plan  Previous labs,  records reviewed.  As noted in HPI.  Calculated creatinine clearance from 10/25/2018 292 mL/min.  Concern for complicated urinary tract infection/early pyelonephritis.  She is on an ACE inhibitor, so cannot give Bactrim .  Will give 1 g of Rocephin and send home with Keflex 500 mg 4 times daily for 10 days.  Will modify treatment based on culture results.  Patient states that she has had amoxicillin previously without any problem.  Discussed labs, MDM, treatment plan, and plan for follow-up with family member and patient. Discussed sn/sx that should prompt return to the ED. they agree with plan.   Meds ordered this encounter  Medications   cefTRIAXone (ROCEPHIN) injection 1 g   cephALEXin (KEFLEX) 500 MG capsule    Sig: Take 1 capsule (500 mg total) by mouth 4 (four) times daily for 10 days.    Dispense:  40 capsule    Refill:  0       *This clinic note was created using [employer]. Therefore, there may be occasional mistakes despite careful proofreading.  ?    Melynda Ripple, MD 02/15/21 [phone]

## 2021-02-17 LAB — URINE CULTURE: Culture: 60000 — AB

## 2021-02-26 ENCOUNTER — Other Ambulatory Visit: Payer: Self-pay | Admitting: Gastroenterology

## 2021-04-09 ENCOUNTER — Ambulatory Visit: Payer: Medicare Other | Admitting: Gastroenterology

## 2021-04-16 IMAGING — MG DIGITAL SCREENING UNILAT RIGHT W/ TOMO W/ CAD
4 series · 4 of 12 positions shown · non-contrast
Comparison: Previous exam(s).

CLINICAL DATA: Screening.

EXAM:
DIGITAL SCREENING UNILATERAL RIGHT MAMMOGRAM WITH CAD AND TOMO

[R MLO synth-2D]
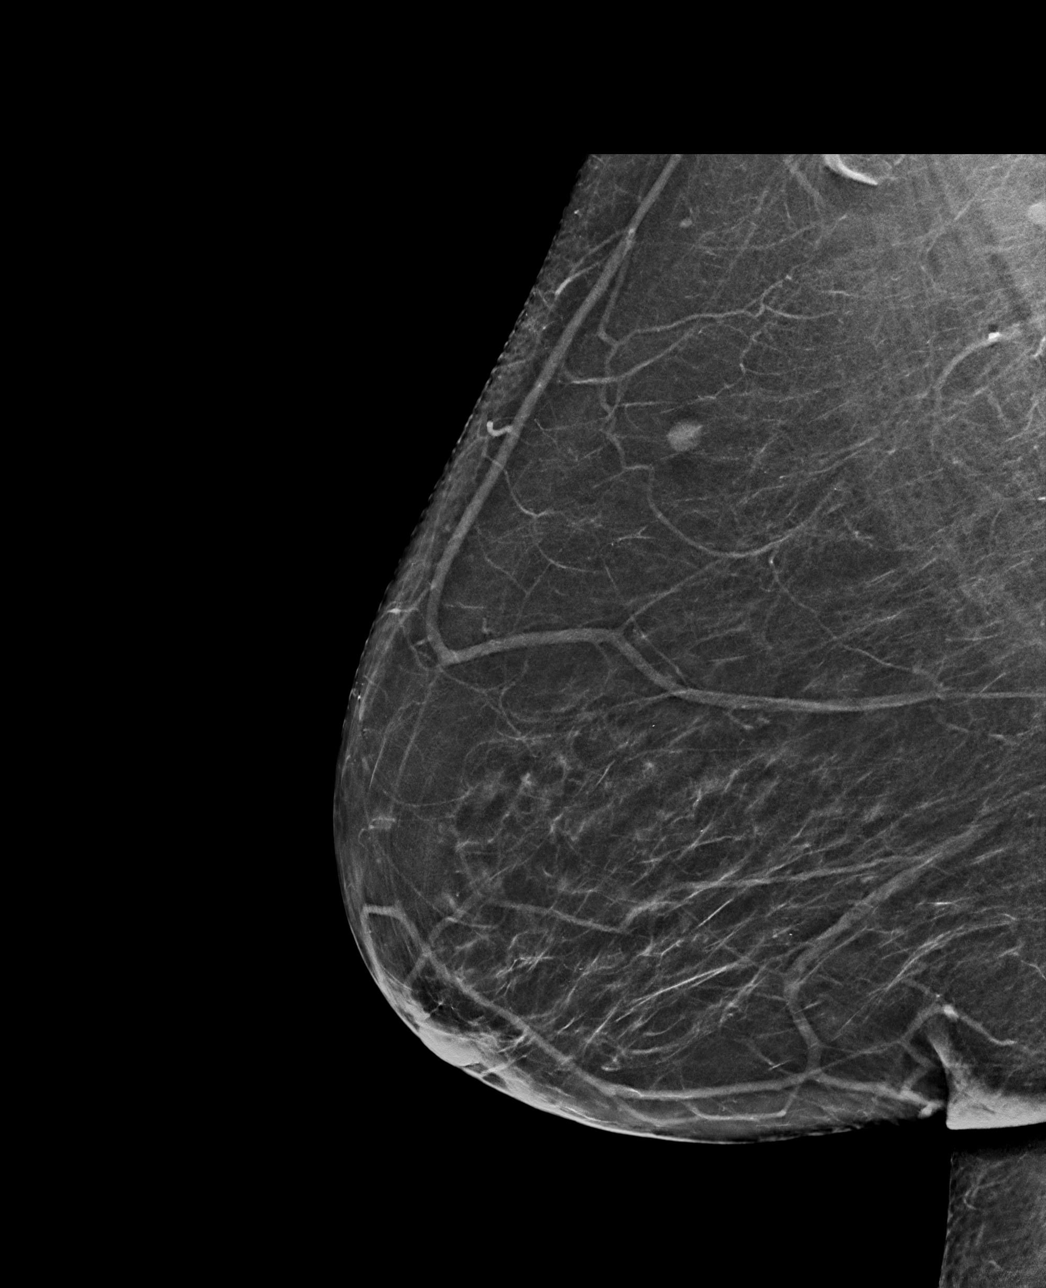

[R CC synth-2D]
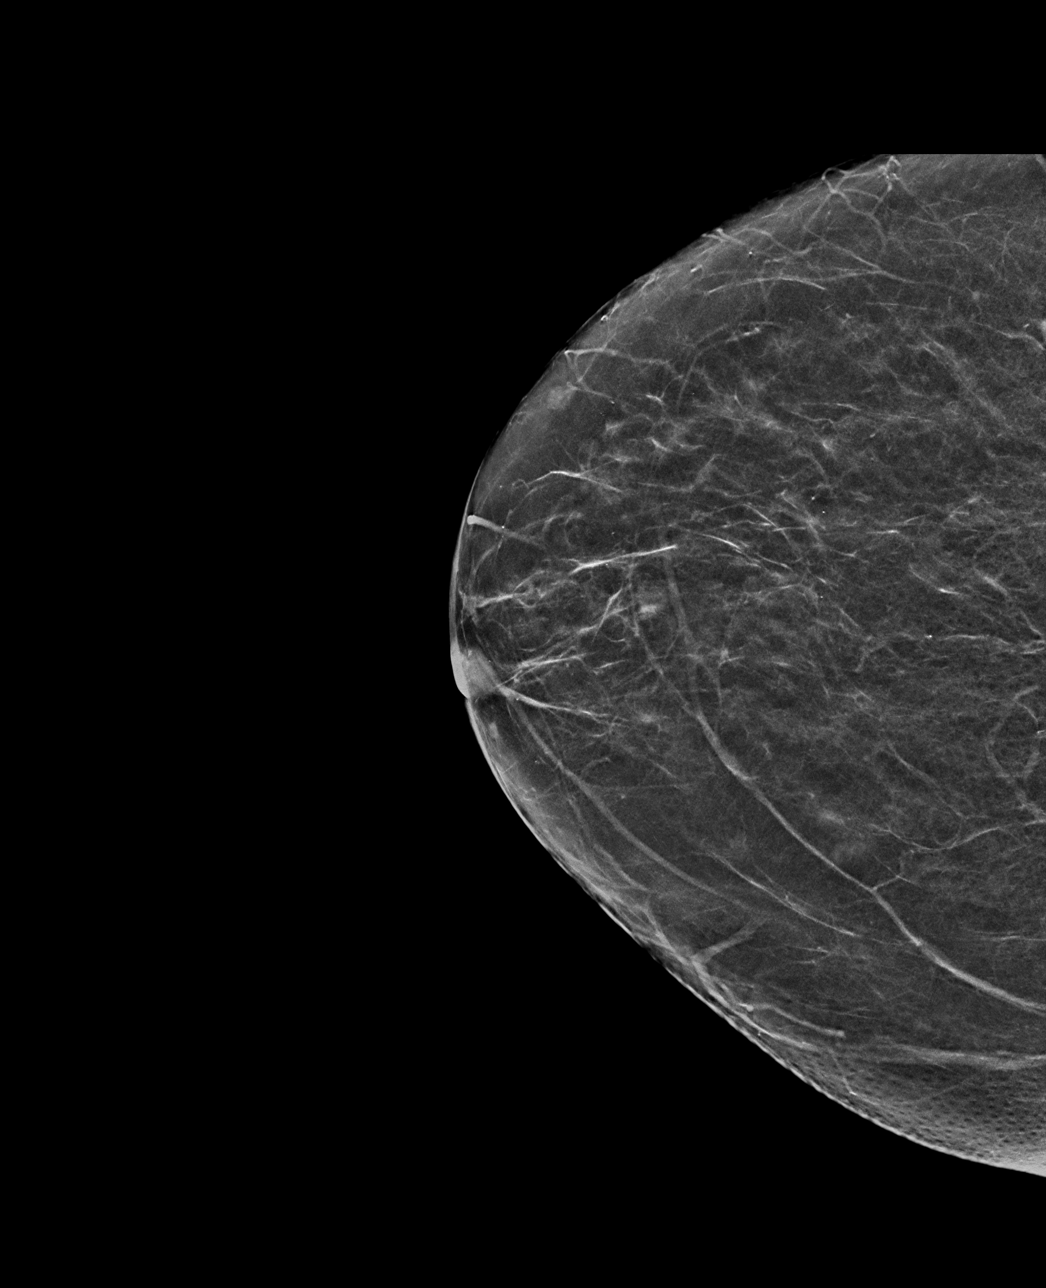

[R CC tomo · tomo slice 27/52.0]
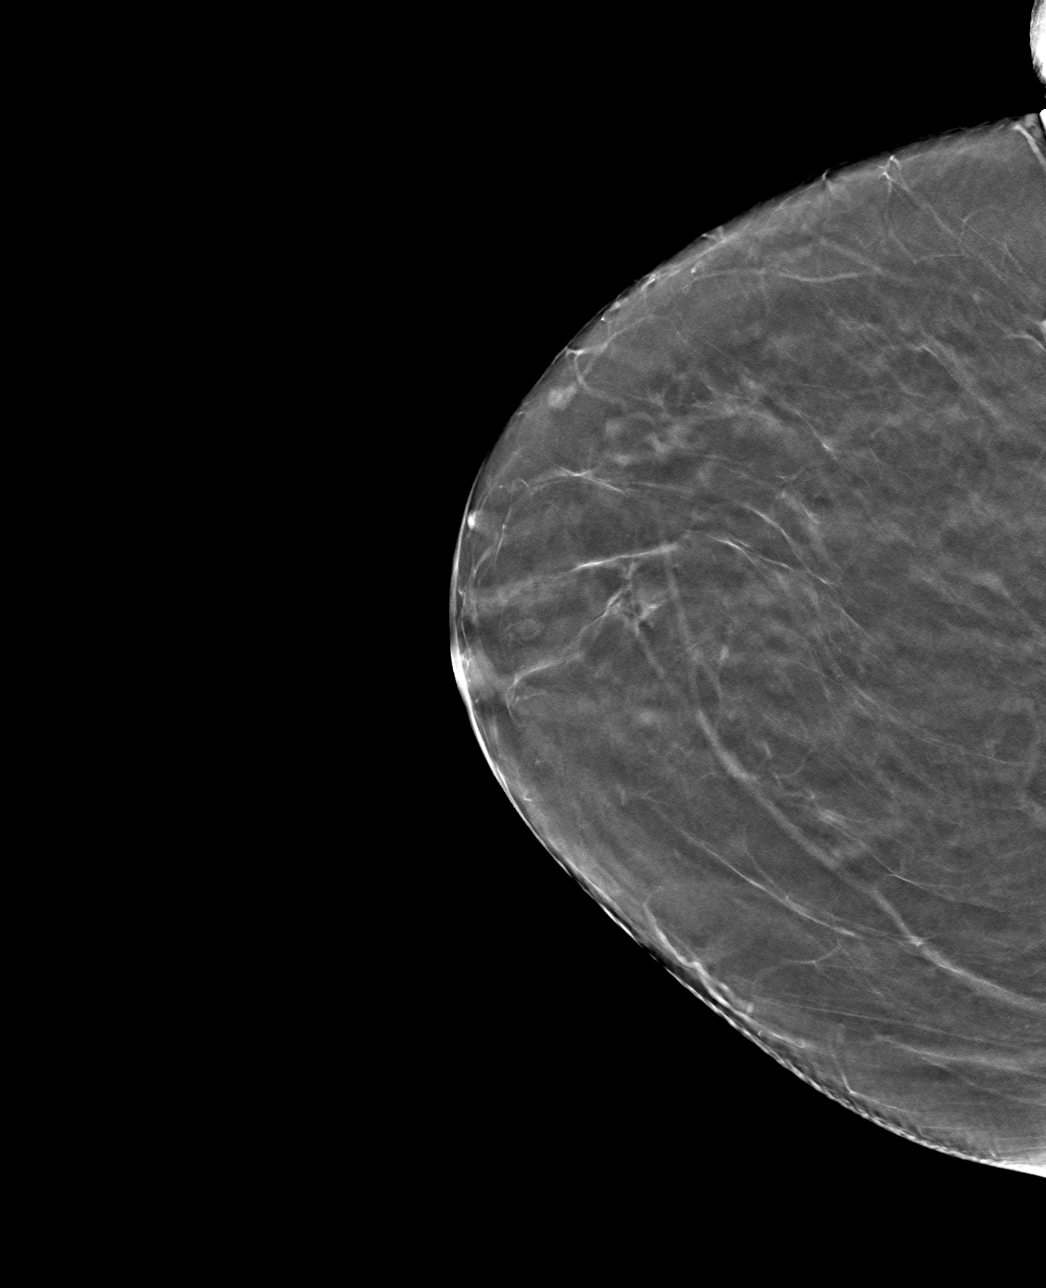

[R MLO tomo · tomo slice 31/61.0]
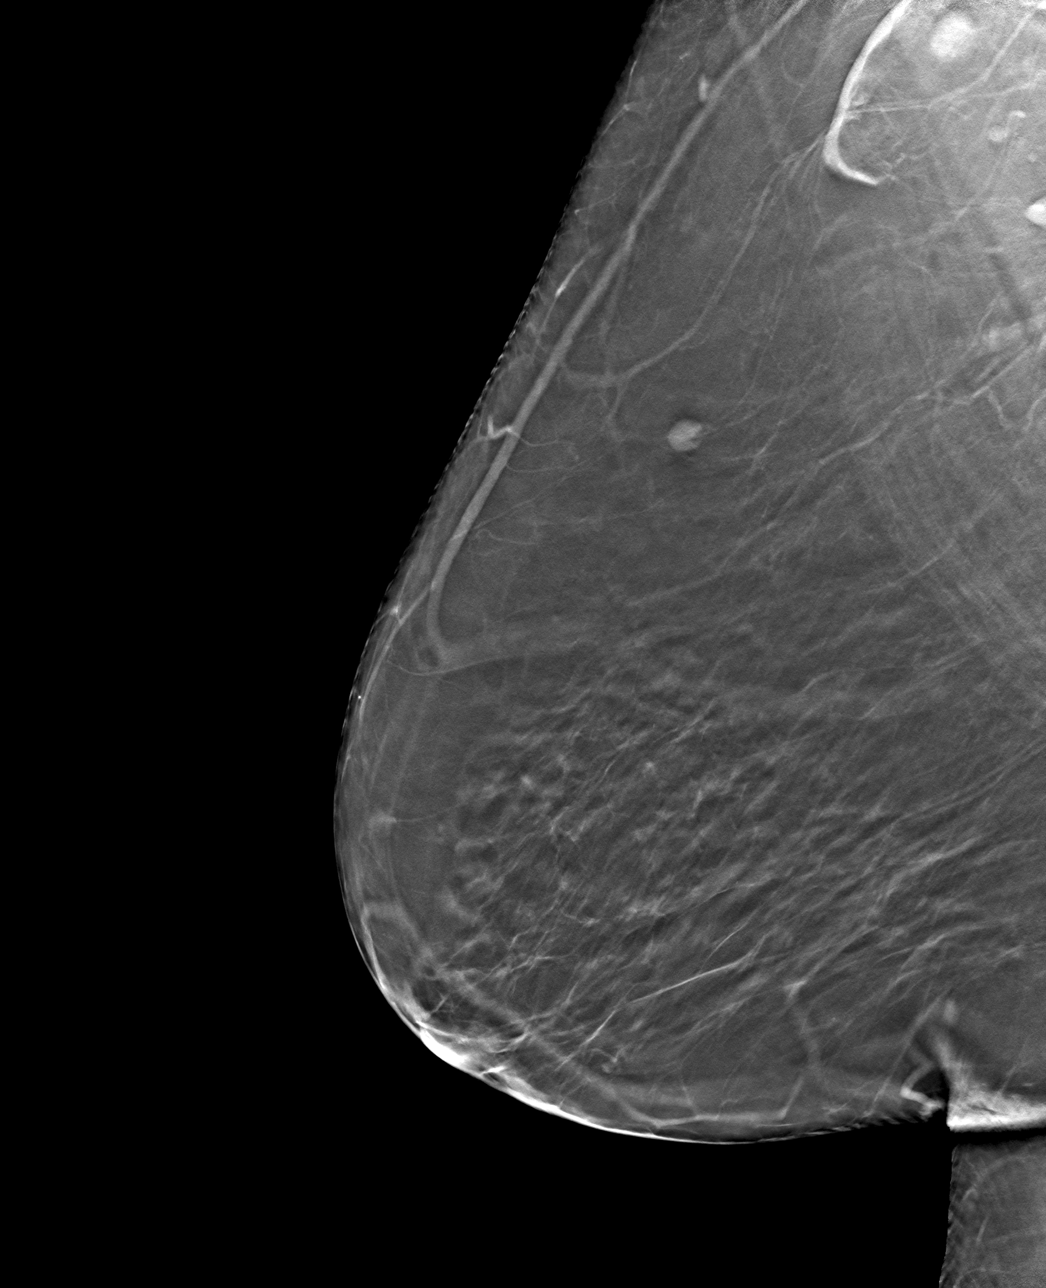

[4 of 12 positions shown; findings below may reference images not displayed]

ACR Breast Density Category b: There are scattered areas of
fibroglandular density.
FINDINGS: There are no findings suspicious for malignancy. Images were
processed with CAD.
IMPRESSION: No mammographic evidence of malignancy. A result letter of this
screening mammogram will be mailed directly to the patient.

RECOMMENDATION:
Screening mammogram in one year. (Code:3Y-2-NJC)

BI-RADS CATEGORY  1: Negative.

## 2021-04-17 ENCOUNTER — Telehealth: Payer: Self-pay | Admitting: Gastroenterology

## 2021-04-17 NOTE — Telephone Encounter (Signed)
Patient having labs done at the cancer center the 28th of December, does she need any labs from [place]. If so, can we add to them

## 2021-04-17 NOTE — Telephone Encounter (Signed)
Pt has an upcoming appointment with Neil Crouch 05/12/21. Will route to her to see if she wants to add any.

## 2021-04-17 NOTE — Telephone Encounter (Signed)
Yes. Can we get AFP tumor marker and PT/INR.

## 2021-04-18 ENCOUNTER — Other Ambulatory Visit: Payer: Self-pay

## 2021-04-18 DIAGNOSIS — K7460 Unspecified cirrhosis of liver: Secondary | ICD-10-CM

## 2021-04-18 DIAGNOSIS — Z79899 Other long term (current) drug therapy: Secondary | ICD-10-CM

## 2021-04-18 NOTE — Telephone Encounter (Signed)
Lab orders were put in and pt was notified.

## 2021-04-30 ENCOUNTER — Ambulatory Visit (HOSPITAL_COMMUNITY)
Admission: RE | Admit: 2021-04-30 | Discharge: 2021-04-30 | Disposition: A | Discharge status: Home / Self Care | Payer: Medicare Other | Source: Ambulatory Visit | Attending: Hematology | Admitting: Hematology

## 2021-04-30 ENCOUNTER — Encounter (HOSPITAL_COMMUNITY): Payer: Self-pay

## 2021-04-30 ENCOUNTER — Inpatient Hospital Stay (HOSPITAL_COMMUNITY): Payer: Medicare Other

## 2021-04-30 ENCOUNTER — Other Ambulatory Visit: Payer: Self-pay

## 2021-04-30 ENCOUNTER — Inpatient Hospital Stay (HOSPITAL_COMMUNITY): Payer: Medicare Other | Attending: Hematology

## 2021-04-30 VITALS — BP 128/83 | HR 90 | Temp 97.9°F | Resp 16 | Wt 155.8 lb

## 2021-04-30 DIAGNOSIS — C50212 Malignant neoplasm of upper-inner quadrant of left female breast: Secondary | ICD-10-CM | POA: Insufficient documentation

## 2021-04-30 DIAGNOSIS — M8580 Other specified disorders of bone density and structure, unspecified site: Secondary | ICD-10-CM

## 2021-04-30 DIAGNOSIS — Z170 Estrogen receptor positive status [ER+]: Secondary | ICD-10-CM | POA: Insufficient documentation

## 2021-04-30 DIAGNOSIS — E559 Vitamin D deficiency, unspecified: Secondary | ICD-10-CM | POA: Insufficient documentation

## 2021-04-30 DIAGNOSIS — M85852 Other specified disorders of bone density and structure, left thigh: Secondary | ICD-10-CM | POA: Insufficient documentation

## 2021-04-30 DIAGNOSIS — Z79811 Long term (current) use of aromatase inhibitors: Secondary | ICD-10-CM | POA: Diagnosis not present

## 2021-04-30 DIAGNOSIS — D696 Thrombocytopenia, unspecified: Secondary | ICD-10-CM | POA: Diagnosis not present

## 2021-04-30 LAB — CBC WITH DIFFERENTIAL/PLATELET
Abs Immature Granulocytes: 0.01 10*3/uL (ref 0.00–0.07)
Basophils Absolute: 0.1 10*3/uL (ref 0.0–0.1)
Basophils Relative: 1 %
Eosinophils Absolute: 0.1 10*3/uL (ref 0.0–0.5)
Eosinophils Relative: 1 %
HCT: 41 % (ref 36.0–46.0)
Hemoglobin: 13.8 g/dL (ref 12.0–15.0)
Immature Granulocytes: 0 %
Lymphocytes Relative: 54 %
Lymphs Abs: 3.7 10*3/uL (ref 0.7–4.0)
MCH: 31.2 pg (ref 26.0–34.0)
MCHC: 33.7 g/dL (ref 30.0–36.0)
MCV: 92.6 fL (ref 80.0–100.0)
Monocytes Absolute: 0.7 10*3/uL (ref 0.1–1.0)
Monocytes Relative: 10 %
Neutro Abs: 2.4 10*3/uL (ref 1.7–7.7)
Neutrophils Relative %: 34 %
Platelets: 146 10*3/uL — ABNORMAL LOW (ref 150–400)
RBC: 4.43 MIL/uL (ref 3.87–5.11)
RDW: 12.4 % (ref 11.5–15.5)
WBC: 6.9 10*3/uL (ref 4.0–10.5)
nRBC: 0 % (ref 0.0–0.2)

## 2021-04-30 LAB — COMPREHENSIVE METABOLIC PANEL
ALT: 9 U/L (ref 0–44)
AST: 17 U/L (ref 15–41)
Albumin: 4.2 g/dL (ref 3.5–5.0)
Alkaline Phosphatase: 36 U/L — ABNORMAL LOW (ref 38–126)
Anion gap: 7 (ref 5–15)
BUN: 18 mg/dL (ref 8–23)
CO2: 27 mmol/L (ref 22–32)
Calcium: 9.1 mg/dL (ref 8.9–10.3)
Chloride: 105 mmol/L (ref 98–111)
Creatinine, Ser: 0.74 mg/dL (ref 0.44–1.00)
GFR, Estimated: >60 mL/min (ref >60)
Glucose, Bld: 93 mg/dL (ref 70–99)
Potassium: 4.7 mmol/L (ref 3.5–5.1)
Sodium: 139 mmol/L (ref 135–145)
Total Bilirubin: 0.6 mg/dL (ref 0.3–1.2)
Total Protein: 6.7 g/dL (ref 6.5–8.1)

## 2021-04-30 LAB — VITAMIN D 25 HYDROXY (VIT D DEFICIENCY, FRACTURES): Vit D, 25-Hydroxy: 46.76 ng/mL (ref 30–100)

## 2021-04-30 MED ORDER — DENOSUMAB 60 MG/ML ~~LOC~~ SOSY
60.0000 mg | PREFILLED_SYRINGE | Freq: Once | SUBCUTANEOUS | Status: AC
  Administered 2021-04-30: 12:00:00 60 mg via SUBCUTANEOUS
  Filled 2021-04-30: qty 1

## 2021-04-30 NOTE — Progress Notes (Signed)
Patient taking calcium as directed.  Denied tooth, jaw, and leg pain.  No recent or upcoming dental visits.  Labs reviewed.  Patient tolerated injection with no complaints voiced.  See MAR for details.  Patient stable during and after injection.  Site clean and dry with no bruising or swelling noted.  Band aid applied.  Vss with discharge and left in satisfactory condition with no s/s of distress noted.   

## 2021-04-30 NOTE — Patient Instructions (Signed)
[facility]  Discharge Instructions: Thank you for choosing [facility] to provide your oncology and hematology care.  If you have a lab appointment with the Cancer Center, please come in thru the Main Entrance and check in at the main information desk.  Wear comfortable clothing and clothing appropriate for easy access to any Portacath or PICC line.   We strive to give you quality time with your provider. You may need to reschedule your appointment if you arrive late (15 or more minutes).  Arriving late affects you and other patients whose appointments are after yours.  Also, if you miss three or more appointments without notifying the office, you may be dismissed from the clinic at the provider's discretion.      For prescription refill requests, have your pharmacy contact our office and allow 72 hours for refills to be completed.        To help prevent nausea and vomiting after your treatment, we encourage you to take your nausea medication as directed.  BELOW ARE SYMPTOMS THAT SHOULD BE REPORTED IMMEDIATELY: *FEVER GREATER THAN 100.4 F (38 C) OR HIGHER *CHILLS OR SWEATING *NAUSEA AND VOMITING THAT IS NOT CONTROLLED WITH YOUR NAUSEA MEDICATION *UNUSUAL SHORTNESS OF BREATH *UNUSUAL BRUISING OR BLEEDING *URINARY PROBLEMS (pain or burning when urinating, or frequent urination) *BOWEL PROBLEMS (unusual diarrhea, constipation, pain near the anus) TENDERNESS IN MOUTH AND THROAT WITH OR WITHOUT PRESENCE OF ULCERS (sore throat, sores in mouth, or a toothache) UNUSUAL RASH, SWELLING OR PAIN  UNUSUAL VAGINAL DISCHARGE OR ITCHING   Items with * indicate a potential emergency and should be followed up as soon as possible or go to the Emergency Department if any problems should occur.  Please show the CHEMOTHERAPY ALERT CARD or IMMUNOTHERAPY ALERT CARD at check-in to the Emergency Department and triage nurse.  Should you have questions after your visit or need to cancel  or reschedule your appointment, please contact [facility] [phone]  and follow the prompts.  Office hours are 8:00 a.m. to 4:30 p.m. Monday - Friday. Please note that voicemails left after 4:00 p.m. may not be returned until the following business day.  We are closed weekends and major holidays. You have access to a nurse at all times for urgent questions. Please call the main number to the clinic [phone] and follow the prompts.  For any non-urgent questions, you may also contact your provider using MyChart. We now offer e-Visits for anyone 18 and older to request care online for non-urgent symptoms. For details visit [email or web address].   Also download the MyChart app! Go to the app store, search "MyChart", open the app, select [facility], and log in with your MyChart username and password.  Due to Covid, a mask is required upon entering the hospital/clinic. If you do not have a mask, one will be given to you upon arrival. For doctor visits, patients may have 1 support person aged 18 or older with them. For treatment visits, patients cannot have anyone with them due to current Covid guidelines and our immunocompromised population.  

## 2021-05-05 ENCOUNTER — Encounter (HOSPITAL_COMMUNITY): Payer: Self-pay | Admitting: Hematology

## 2021-05-07 ENCOUNTER — Inpatient Hospital Stay (HOSPITAL_COMMUNITY)
Admission: EM | Admit: 2021-05-07 | Discharge: 2021-05-13 | DRG: 492 | Disposition: A | Discharge status: Home Health | Payer: Medicare Other | Attending: Student | Admitting: Student

## 2021-05-07 ENCOUNTER — Emergency Department (HOSPITAL_COMMUNITY): Payer: Medicare Other

## 2021-05-07 ENCOUNTER — Other Ambulatory Visit: Payer: Self-pay

## 2021-05-07 DIAGNOSIS — Y92531 Health care provider office as the place of occurrence of the external cause: Secondary | ICD-10-CM

## 2021-05-07 DIAGNOSIS — D696 Thrombocytopenia, unspecified: Secondary | ICD-10-CM | POA: Diagnosis present

## 2021-05-07 DIAGNOSIS — I10 Essential (primary) hypertension: Secondary | ICD-10-CM | POA: Diagnosis not present

## 2021-05-07 DIAGNOSIS — Z7982 Long term (current) use of aspirin: Secondary | ICD-10-CM

## 2021-05-07 DIAGNOSIS — E039 Hypothyroidism, unspecified: Secondary | ICD-10-CM | POA: Diagnosis present

## 2021-05-07 DIAGNOSIS — Z79811 Long term (current) use of aromatase inhibitors: Secondary | ICD-10-CM

## 2021-05-07 DIAGNOSIS — Z8739 Personal history of other diseases of the musculoskeletal system and connective tissue: Secondary | ICD-10-CM | POA: Diagnosis not present

## 2021-05-07 DIAGNOSIS — Z79899 Other long term (current) drug therapy: Secondary | ICD-10-CM | POA: Diagnosis not present

## 2021-05-07 DIAGNOSIS — R5082 Postprocedural fever: Secondary | ICD-10-CM | POA: Diagnosis not present

## 2021-05-07 DIAGNOSIS — Z170 Estrogen receptor positive status [ER+]: Secondary | ICD-10-CM

## 2021-05-07 DIAGNOSIS — J449 Chronic obstructive pulmonary disease, unspecified: Secondary | ICD-10-CM | POA: Diagnosis present

## 2021-05-07 DIAGNOSIS — S82841A Displaced bimalleolar fracture of right lower leg, initial encounter for closed fracture: Principal | ICD-10-CM | POA: Diagnosis present

## 2021-05-07 DIAGNOSIS — Z8659 Personal history of other mental and behavioral disorders: Secondary | ICD-10-CM | POA: Diagnosis not present

## 2021-05-07 DIAGNOSIS — W010XXA Fall on same level from slipping, tripping and stumbling without subsequent striking against object, initial encounter: Secondary | ICD-10-CM | POA: Diagnosis present

## 2021-05-07 DIAGNOSIS — U071 COVID-19: Secondary | ICD-10-CM | POA: Diagnosis present

## 2021-05-07 DIAGNOSIS — K219 Gastro-esophageal reflux disease without esophagitis: Secondary | ICD-10-CM | POA: Diagnosis present

## 2021-05-07 DIAGNOSIS — Z880 Allergy status to penicillin: Secondary | ICD-10-CM

## 2021-05-07 DIAGNOSIS — E785 Hyperlipidemia, unspecified: Secondary | ICD-10-CM | POA: Diagnosis present

## 2021-05-07 DIAGNOSIS — S82891A Other fracture of right lower leg, initial encounter for closed fracture: Secondary | ICD-10-CM

## 2021-05-07 DIAGNOSIS — W19XXXA Unspecified fall, initial encounter: Secondary | ICD-10-CM | POA: Diagnosis not present

## 2021-05-07 DIAGNOSIS — Z808 Family history of malignant neoplasm of other organs or systems: Secondary | ICD-10-CM

## 2021-05-07 DIAGNOSIS — J9611 Chronic respiratory failure with hypoxia: Secondary | ICD-10-CM | POA: Diagnosis present

## 2021-05-07 DIAGNOSIS — Z885 Allergy status to narcotic agent status: Secondary | ICD-10-CM

## 2021-05-07 DIAGNOSIS — G250 Essential tremor: Secondary | ICD-10-CM | POA: Diagnosis present

## 2021-05-07 DIAGNOSIS — I5032 Chronic diastolic (congestive) heart failure: Secondary | ICD-10-CM | POA: Diagnosis present

## 2021-05-07 DIAGNOSIS — I444 Left anterior fascicular block: Secondary | ICD-10-CM | POA: Diagnosis present

## 2021-05-07 DIAGNOSIS — Z800 Family history of malignant neoplasm of digestive organs: Secondary | ICD-10-CM | POA: Diagnosis not present

## 2021-05-07 DIAGNOSIS — C50919 Malignant neoplasm of unspecified site of unspecified female breast: Secondary | ICD-10-CM | POA: Diagnosis present

## 2021-05-07 DIAGNOSIS — C50212 Malignant neoplasm of upper-inner quadrant of left female breast: Secondary | ICD-10-CM | POA: Diagnosis not present

## 2021-05-07 DIAGNOSIS — K766 Portal hypertension: Secondary | ICD-10-CM | POA: Diagnosis present

## 2021-05-07 DIAGNOSIS — Z9012 Acquired absence of left breast and nipple: Secondary | ICD-10-CM

## 2021-05-07 DIAGNOSIS — F319 Bipolar disorder, unspecified: Secondary | ICD-10-CM | POA: Diagnosis present

## 2021-05-07 DIAGNOSIS — M810 Age-related osteoporosis without current pathological fracture: Secondary | ICD-10-CM | POA: Diagnosis present

## 2021-05-07 DIAGNOSIS — F419 Anxiety disorder, unspecified: Secondary | ICD-10-CM | POA: Diagnosis present

## 2021-05-07 DIAGNOSIS — S82899A Other fracture of unspecified lower leg, initial encounter for closed fracture: Secondary | ICD-10-CM | POA: Diagnosis present

## 2021-05-07 DIAGNOSIS — F17200 Nicotine dependence, unspecified, uncomplicated: Secondary | ICD-10-CM | POA: Diagnosis not present

## 2021-05-07 DIAGNOSIS — K3189 Other diseases of stomach and duodenum: Secondary | ICD-10-CM | POA: Diagnosis present

## 2021-05-07 DIAGNOSIS — I110 Hypertensive heart disease with heart failure: Secondary | ICD-10-CM | POA: Diagnosis present

## 2021-05-07 DIAGNOSIS — F17210 Nicotine dependence, cigarettes, uncomplicated: Secondary | ICD-10-CM | POA: Diagnosis present

## 2021-05-07 DIAGNOSIS — Z79890 Hormone replacement therapy (postmenopausal): Secondary | ICD-10-CM

## 2021-05-07 DIAGNOSIS — Z9889 Other specified postprocedural states: Secondary | ICD-10-CM

## 2021-05-07 DIAGNOSIS — S99911A Unspecified injury of right ankle, initial encounter: Secondary | ICD-10-CM | POA: Diagnosis not present

## 2021-05-07 DIAGNOSIS — R251 Tremor, unspecified: Secondary | ICD-10-CM | POA: Diagnosis not present

## 2021-05-07 DIAGNOSIS — Z853 Personal history of malignant neoplasm of breast: Secondary | ICD-10-CM

## 2021-05-07 DIAGNOSIS — T148XXA Other injury of unspecified body region, initial encounter: Secondary | ICD-10-CM

## 2021-05-07 LAB — CBC WITH DIFFERENTIAL/PLATELET
Abs Immature Granulocytes: 0.01 10*3/uL (ref 0.00–0.07)
Basophils Absolute: 0 10*3/uL (ref 0.0–0.1)
Basophils Relative: 1 %
Eosinophils Absolute: 0.1 10*3/uL (ref 0.0–0.5)
Eosinophils Relative: 3 %
HCT: 42.4 % (ref 36.0–46.0)
Hemoglobin: 14.2 g/dL (ref 12.0–15.0)
Immature Granulocytes: 0 %
Lymphocytes Relative: 49 %
Lymphs Abs: 2.5 10*3/uL (ref 0.7–4.0)
MCH: 31.9 pg (ref 26.0–34.0)
MCHC: 33.5 g/dL (ref 30.0–36.0)
MCV: 95.3 fL (ref 80.0–100.0)
Monocytes Absolute: 0.5 10*3/uL (ref 0.1–1.0)
Monocytes Relative: 10 %
Neutro Abs: 1.8 10*3/uL (ref 1.7–7.7)
Neutrophils Relative %: 37 %
Platelets: 130 10*3/uL — ABNORMAL LOW (ref 150–400)
RBC: 4.45 MIL/uL (ref 3.87–5.11)
RDW: 12.7 % (ref 11.5–15.5)
WBC: 5 10*3/uL (ref 4.0–10.5)
nRBC: 0 % (ref 0.0–0.2)

## 2021-05-07 LAB — BASIC METABOLIC PANEL
Anion gap: 10 (ref 5–15)
BUN: 17 mg/dL (ref 8–23)
CO2: 26 mmol/L (ref 22–32)
Calcium: 8.6 mg/dL — ABNORMAL LOW (ref 8.9–10.3)
Chloride: 104 mmol/L (ref 98–111)
Creatinine, Ser: 0.72 mg/dL (ref 0.44–1.00)
GFR, Estimated: >60 mL/min (ref >60)
Glucose, Bld: 103 mg/dL — ABNORMAL HIGH (ref 70–99)
Potassium: 4.8 mmol/L (ref 3.5–5.1)
Sodium: 140 mmol/L (ref 135–145)

## 2021-05-07 LAB — RESP PANEL BY RT-PCR (FLU A&B, COVID) ARPGX2
Influenza A by PCR: NEGATIVE
Influenza B by PCR: NEGATIVE
SARS Coronavirus 2 by RT PCR: POSITIVE — AB

## 2021-05-07 MED ORDER — ANASTROZOLE 1 MG PO TABS
1.0000 mg | ORAL_TABLET | Freq: Every day | ORAL | Status: DC
  Administered 2021-05-08 – 2021-05-13 (×5): 1 mg via ORAL
  Filled 2021-05-07 (×6): qty 1

## 2021-05-07 MED ORDER — OXYCODONE HCL 5 MG PO TABS
5.0000 mg | ORAL_TABLET | ORAL | Status: DC | PRN
  Administered 2021-05-08 – 2021-05-13 (×12): 5 mg via ORAL
  Filled 2021-05-07 (×12): qty 1

## 2021-05-07 MED ORDER — CARVEDILOL 3.125 MG PO TABS
3.1250 mg | ORAL_TABLET | Freq: Two times a day (BID) | ORAL | Status: DC
  Administered 2021-05-08 – 2021-05-13 (×11): 3.125 mg via ORAL
  Filled 2021-05-07 (×10): qty 1

## 2021-05-07 MED ORDER — ACETAMINOPHEN 650 MG RE SUPP: 650.0000 mg | Freq: Four times a day (QID) | RECTAL | Status: DC | PRN

## 2021-05-07 MED ORDER — FENTANYL CITRATE PF 50 MCG/ML IJ SOSY
25.0000 ug | PREFILLED_SYRINGE | Freq: Once | INTRAMUSCULAR | Status: AC
  Administered 2021-05-07: 25 ug via INTRAVENOUS
  Filled 2021-05-07: qty 1

## 2021-05-07 MED ORDER — ESCITALOPRAM OXALATE 10 MG PO TABS
20.0000 mg | ORAL_TABLET | Freq: Every day | ORAL | Status: DC
  Administered 2021-05-08 – 2021-05-13 (×5): 20 mg via ORAL
  Filled 2021-05-07 (×5): qty 2

## 2021-05-07 MED ORDER — DIVALPROEX SODIUM 250 MG PO DR TAB
250.0000 mg | DELAYED_RELEASE_TABLET | Freq: Three times a day (TID) | ORAL | Status: DC
  Administered 2021-05-08 – 2021-05-13 (×16): 250 mg via ORAL
  Filled 2021-05-07 (×19): qty 1

## 2021-05-07 MED ORDER — CLONAZEPAM 0.5 MG PO TABS
0.5000 mg | ORAL_TABLET | Freq: Every day | ORAL | Status: DC
  Administered 2021-05-08 – 2021-05-12 (×6): 0.5 mg via ORAL
  Filled 2021-05-07 (×6): qty 1

## 2021-05-07 MED ORDER — ACETAMINOPHEN 325 MG PO TABS
650.0000 mg | ORAL_TABLET | Freq: Four times a day (QID) | ORAL | Status: DC | PRN
  Administered 2021-05-08 – 2021-05-13 (×5): 650 mg via ORAL
  Filled 2021-05-07 (×5): qty 2

## 2021-05-07 MED ORDER — GABAPENTIN 300 MG PO CAPS
300.0000 mg | ORAL_CAPSULE | Freq: Two times a day (BID) | ORAL | Status: DC
  Administered 2021-05-08 – 2021-05-13 (×10): 300 mg via ORAL
  Filled 2021-05-07 (×10): qty 1

## 2021-05-07 MED ORDER — HEPARIN SODIUM (PORCINE) 5000 UNIT/ML IJ SOLN
5000.0000 [IU] | Freq: Three times a day (TID) | INTRAMUSCULAR | Status: DC
  Administered 2021-05-08 – 2021-05-09 (×4): 5000 [IU] via SUBCUTANEOUS
  Filled 2021-05-07 (×6): qty 1

## 2021-05-07 MED ORDER — OXYBUTYNIN CHLORIDE ER 5 MG PO TB24
5.0000 mg | ORAL_TABLET | Freq: Every day | ORAL | Status: DC
  Administered 2021-05-08 – 2021-05-13 (×5): 5 mg via ORAL
  Filled 2021-05-07 (×6): qty 1

## 2021-05-07 MED ORDER — NIRMATRELVIR/RITONAVIR (PAXLOVID)TABLET: 3.0000 | ORAL_TABLET | Freq: Two times a day (BID) | ORAL | Status: DC

## 2021-05-07 MED ORDER — MORPHINE SULFATE (PF) 2 MG/ML IV SOLN
1.0000 mg | INTRAVENOUS | Status: DC | PRN
  Administered 2021-05-08 – 2021-05-11 (×5): 1 mg via INTRAVENOUS
  Filled 2021-05-07 (×6): qty 1

## 2021-05-07 MED ORDER — PRIMIDONE 50 MG PO TABS
100.0000 mg | ORAL_TABLET | Freq: Two times a day (BID) | ORAL | Status: DC
  Administered 2021-05-08 – 2021-05-13 (×10): 100 mg via ORAL
  Filled 2021-05-07 (×13): qty 2

## 2021-05-07 MED ORDER — CARBIDOPA-LEVODOPA 25-100 MG PO TABS
1.5000 | ORAL_TABLET | Freq: Three times a day (TID) | ORAL | Status: DC
  Administered 2021-05-08 – 2021-05-13 (×15): 1.5 via ORAL
  Filled 2021-05-07 (×16): qty 2

## 2021-05-07 MED ORDER — ATORVASTATIN CALCIUM 40 MG PO TABS
40.0000 mg | ORAL_TABLET | Freq: Every day | ORAL | Status: DC
  Administered 2021-05-08 – 2021-05-13 (×5): 40 mg via ORAL
  Filled 2021-05-07 (×5): qty 1

## 2021-05-07 MED ORDER — ALBUTEROL SULFATE HFA 108 (90 BASE) MCG/ACT IN AERS: 2.0000 | INHALATION_SPRAY | RESPIRATORY_TRACT | Status: DC | PRN

## 2021-05-07 MED ORDER — ASPIRIN EC 81 MG PO TBEC: 81.0000 mg | DELAYED_RELEASE_TABLET | Freq: Every day | ORAL | Status: DC

## 2021-05-07 MED ORDER — ADULT MULTIVITAMIN W/MINERALS CH
1.0000 | ORAL_TABLET | Freq: Every morning | ORAL | Status: DC
  Administered 2021-05-08 – 2021-05-13 (×4): 1 via ORAL
  Filled 2021-05-07 (×3): qty 1

## 2021-05-07 MED ORDER — LEVOTHYROXINE SODIUM 25 MCG PO TABS
125.0000 ug | ORAL_TABLET | Freq: Every day | ORAL | Status: DC
  Administered 2021-05-08 – 2021-05-13 (×4): 125 ug via ORAL
  Filled 2021-05-07 (×5): qty 1

## 2021-05-07 MED ORDER — MOLNUPIRAVIR EUA 200MG CAPSULE
4.0000 | ORAL_CAPSULE | Freq: Two times a day (BID) | ORAL | Status: AC
  Administered 2021-05-08 – 2021-05-12 (×9): 800 mg via ORAL
  Filled 2021-05-07 (×3): qty 4

## 2021-05-07 MED ORDER — FENTANYL CITRATE PF 50 MCG/ML IJ SOSY
50.0000 ug | PREFILLED_SYRINGE | Freq: Once | INTRAMUSCULAR | Status: AC
  Administered 2021-05-07: 50 ug via INTRAVENOUS
  Filled 2021-05-07: qty 1

## 2021-05-07 MED ORDER — PANTOPRAZOLE SODIUM 40 MG PO TBEC
40.0000 mg | DELAYED_RELEASE_TABLET | Freq: Every day | ORAL | Status: DC
  Administered 2021-05-08 – 2021-05-13 (×5): 40 mg via ORAL
  Filled 2021-05-07 (×5): qty 1

## 2021-05-07 NOTE — Progress Notes (Signed)
Patient discussed with hospitalist.  Transferring from [facility] to [facility] to medical service for medical/social reasons.  Was consulted for right bimalleolar ankle fracture.  Talus located on current imaging.  Please place in AO splint for stability and elevate higher than heart.  Anticipate ORIF within 2 weeks of injury.  This can be performed on outpatient basis.  Full consult to follow.  Georgeanna Harrison M.D. Orthopaedic Surgery [facility]

## 2021-05-07 NOTE — Plan of Care (Signed)

## 2021-05-07 NOTE — ED Provider Notes (Signed)
[facility] EMERGENCY DEPARTMENT Provider Note   CSN: [id number] Arrival date & time: 05/07/21  1403     History  Chief Complaint  Patient presents with   Fall   Ankle Pain    Laurie Moreno is a 71 y.o. female.   Fall Pertinent negatives include no chest pain, no abdominal pain, no headaches and no shortness of breath.  Ankle Pain Associated symptoms: no back pain, no fever and no neck pain       Laurie Moreno is a 70 y.o. female with past medical history of bipolar disorder, tremor, CHF, COPD, who presents to the Emergency Department complaining of right ankle pain and deformity.  Patient states that she suffered a mechanical fall around 1 PM.  She was at her psychiatrist office when she fell.  States the area was wet and she slipped.  States she struck the back of her head and twisted her right ankle.  She reports immediate pain and swelling of her right ankle.  She has been unable to bear weight since the fall.  She denies loss of consciousness.  She denies any neck pain headache or dizziness.  Denies any hip, low back or knee pain.   Home Medications Prior to Admission medications   Medication Sig Start Date End Date Taking? Authorizing Provider  albuterol (VENTOLIN HFA) 108 (90 Base) MCG/ACT inhaler Inhale 2 puffs into the lungs as needed for wheezing or shortness of breath.    [provider]  anastrozole (ARIMIDEX) 1 MG tablet TAKE 1 TABLET BY MOUTH EVERY DAY 02/02/21   Derek Jack, MD  aspirin EC 81 MG tablet Take 81 mg by mouth daily. Swallow whole.    [provider]  atorvastatin (LIPITOR) 40 MG tablet Take 40 mg by mouth daily.     [provider]  Calcium-Magnesium-Vitamin D (CALCIUM 1200+D3 PO) Take 1 tablet by mouth in the morning and at bedtime.    [provider]  carbidopa-levodopa (SINEMET IR) 25-100 MG tablet Take 1 tablet by mouth 3 (three) times daily.  10/04/18 10/30/20  [provider]  carvedilol (COREG)  3.125 MG tablet Take 3.125 mg by mouth 2 (two) times daily with a meal.    [provider]  clonazePAM (KLONOPIN) 0.5 MG tablet Take 1 tablet (0.5 mg total) by mouth 2 (two) times daily. Patient taking differently: Take 0.5 mg by mouth at bedtime. 09/17/17   Wille Celeste, PA-C  Denosumab (PROLIA [place]) Inject into the skin every 6 (six) months.    [provider]  divalproex (DEPAKOTE) 250 MG DR tablet Take 250 mg by mouth 3 (three) times daily. Do not crush    [provider]  escitalopram (LEXAPRO) 20 MG tablet Take 20 mg by mouth daily. 12/20/18   [provider]  gabapentin (NEURONTIN) 300 MG capsule Take 300 mg by mouth 2 (two) times daily.    [provider]  hydrOXYzine (ATARAX/VISTARIL) 25 MG tablet Take 25 mg by mouth every 8 (eight) hours as needed for itching.     [provider]  levothyroxine (SYNTHROID, LEVOTHROID) 125 MCG tablet Take 125 mcg by mouth daily before breakfast.    [provider]  lisinopril (ZESTRIL) 2.5 MG tablet Take 2.5 mg by mouth daily. 07/02/20   [provider]  loratadine (CLARITIN) 10 MG tablet Take 10 mg by mouth daily.    [provider]  Multiple Vitamin (MULTIVITAMIN) tablet Take 1 tablet by mouth every morning.  [provider]  pantoprazole (PROTONIX) 40 MG tablet TAKE 1 TABLET BY MOUTH EVERY DAY BEFORE BREAKFAST 02/27/21   Mahala Menghini, PA-C  primidone (MYSOLINE) 50 MG tablet Take 100 mg by mouth 2 (two) times daily.     [provider]      Allergies    Codeine and Penicillins    Review of Systems   Review of Systems  Constitutional:  Negative for chills and fever.  Respiratory:  Negative for shortness of breath.   Cardiovascular:  Negative for chest pain and palpitations.  Gastrointestinal:  Negative for abdominal pain, nausea and vomiting.  Genitourinary:  Negative for dysuria, flank pain and hematuria.  Musculoskeletal:  Positive for  arthralgias (Right ankle pain and swelling) and gait problem. Negative for back pain, myalgias, neck pain and neck stiffness.  Skin:  Negative for rash.  Neurological:  Negative for dizziness, syncope, weakness, numbness and headaches.  Hematological:  Does not bruise/bleed easily.  All other systems reviewed and are negative.  Physical Exam Updated Vital Signs BP (!) 156/78    Pulse 72    Temp 98.8 F (37.1 C) (Oral)    Resp 18    Ht 5\' 4"  (1.626 m)    Wt 74.8 kg    SpO2 91%    BMI 28.32 kg/m  Physical Exam Vitals and nursing note reviewed.  Constitutional:      General: She is not in acute distress.    Appearance: Normal appearance. She is not ill-appearing or toxic-appearing.  HENT:     Head:     Comments: Focal tenderness of the occipital scalp.  No hematoma or abrasion noted.  No bony deformity. Eyes:     Conjunctiva/sclera: Conjunctivae normal.     Pupils: Pupils are equal, round, and reactive to light.  Cardiovascular:     Rate and Rhythm: Normal rate and regular rhythm.     Pulses: Normal pulses.  Pulmonary:     Effort: Pulmonary effort is normal.  Chest:     Chest wall: No tenderness.  Musculoskeletal:        General: Swelling, tenderness and signs of injury present.     Cervical back: Normal range of motion. No tenderness.     Right ankle: Swelling and ecchymosis present. Tenderness present over the lateral malleolus and medial malleolus. Decreased range of motion. Normal pulse.     Comments: Diffuse tenderness of the medial and lateral aspects of the right ankle.  Moderate edema present, ecchymosis also present.  No tenderness proximal to the ankle.  Skin:    General: Skin is warm.     Capillary Refill: Capillary refill takes less than 2 seconds.  Neurological:     General: No focal deficit present.     Mental Status: She is alert.     Sensory: No sensory deficit.     Motor: No weakness.    ED Results / Procedures / Treatments   Labs (all labs ordered are  listed, but only abnormal results are displayed) Labs Reviewed  RESP PANEL BY RT-PCR (FLU A&B, COVID) ARPGX2 - Abnormal; Notable for the following components:      Result Value   SARS Coronavirus 2 by RT PCR POSITIVE (*)    All other components within normal limits  CBC WITH DIFFERENTIAL/PLATELET - Abnormal; Notable for the following components:   Platelets 130 (*)    All other components within normal limits  BASIC METABOLIC PANEL - Abnormal; Notable for the following components:   Glucose,  Bld 103 (*)    Calcium 8.6 (*)    All other components within normal limits    EKG None  Radiology DG Ankle Complete Right  Result Date: 05/07/2021 CLINICAL DATA:  Right ankle pain and swelling after fall. EXAM: RIGHT ANKLE - COMPLETE 3+ VIEW COMPARISON:  None. FINDINGS: Acute fracture of the medial malleolus with mild medial and inferior displacement. Acute comminuted fracture of the distal fibular metaphysis with mild lateral displacement and overriding. No obvious posterior malleolar fracture. Asymmetric widening of the ankle mortise. No dislocation. Joint spaces are preserved. Diffuse soft tissue swelling, more prominent laterally. IMPRESSION: 1. Acute bimalleolar fractures with ankle instability. Electronically Signed   By: Titus Dubin M.D.   On: 05/07/2021 15:22   CT Head Wo Contrast  Result Date: 05/07/2021 CLINICAL DATA:  Head trauma.  Status post fall.  Hit back of head EXAM: CT HEAD WITHOUT CONTRAST TECHNIQUE: Contiguous axial images were obtained from the base of the skull through the vertex without intravenous contrast. COMPARISON:  MRI brain 08/06/2017. FINDINGS: Brain: No evidence of acute infarction, hemorrhage, hydrocephalus, extra-axial collection or mass lesion/mass effect. There is mild diffuse low-attenuation within the subcortical and periventricular white matter compatible with chronic microvascular disease. Prominence of the sulci and ventricles compatible with brain atrophy.  Vascular: No hyperdense vessel or unexpected calcification. Skull: Normal. Negative for fracture or focal lesion. Sinuses/Orbits: No acute finding. Other: None. IMPRESSION: 1. No acute intracranial abnormalities. 2. Mild chronic small vessel ischemic change and brain atrophy. Electronically Signed   By: Kerby Moors M.D.   On: 05/07/2021 15:46    Procedures Procedures     SPLINT APPLICATION Date/Time: 1:61 PM Authorized by: Nusaiba Guallpa Consent: Verbal consent obtained. Risks and benefits: risks, benefits and alternatives were discussed Consent given by: patient Splint applied by: Nursing Location details: Right ankle Splint type: Stirrup splint and posterior short leg splint Supplies used: Ortho-Glass, Ace wrap Post-procedure: The splinted body part was neurovascularly unchanged following the procedure. Patient tolerance: Patient tolerated the procedure well with no immediate complications.   Medications Ordered in ED Medications  fentaNYL (SUBLIMAZE) injection 25 mcg (25 mcg Intravenous Given 05/07/21 1521)  fentaNYL (SUBLIMAZE) injection 25 mcg (25 mcg Intravenous Given 05/07/21 1653)    ED Course/ Medical Decision Making/ A&P                           Medical Decision Making  Patient here with right ankle injury that occurred earlier today from mechanical fall.  Also struck the back of her head.  No LOC.  Patient does not take anticoagulants.  On my exam, patient with obvious injury to right ankle.  Edema and ecchymosis present.  No open fracture.  No scalp hematoma or bony deformity of the skull.  Patient mentating well.  No focal neurodeficits on exam.  Patient appears uncomfortable but not critically ill.  There is palpable dorsalis pedis and posterior tibial pulses.  Good cap refill distal foot.  Compartments are soft.  Differential diagnosis would include acute fracture, sprain, acute head injury with concussion, subdural hematoma intracranial injury  Plan today will  include imaging of the head and ankle.  Her pain addressed here with IV pain medication.  CT of the head interpretation by radiologist as negative for acute intracranial injury.  Imaging reviewed by me and I am agreeable with radiology interpretation.  X-ray of the right ankle images reviewed by me, my interpretation shows bimalleolar fracture without evidence  of dislocation.  Agree with radiology interpretation.  Will consult orthopedics for further recommendation.  Discussed findings with orthopedics, Dr. Aline Brochure.  He recommends posterior and stirrup splints to the left ankle.  If patient unable to ambulate at home, will need admission to the hospital, otherwise he will see patient in office for follow-up.  Discussed further care with patient's sister who is at bedside.  Patient lives with sister and her husband and she feels she is unable to care for her at home if she is to be nonweightbearing.  I will plan to consult hospitalist for admission.  Dr. Aline Brochure notified of current plan through secure chat message.  Will obtain labs for admission and COVID testing.  On recheck after splinting, extremity remains neurovascularly intact.  Pain controlled.  Labs obtained for hospital admission, no leukocytosis, no significant electrolyte derangement.  Discussed findings with [facility] hospitalist, Dr. Waldron Labs.  Agreeable to admit patient with orthopedic consult for surgery.  COVID testing ordered for admission and found to be COVID-positive.  Patient endorses chronic cough no other symptoms reported.  Has been COVID vaccinated x2 and 3 boosters.         Final Clinical Impression(s) / ED Diagnoses Final diagnoses:  Closed bimalleolar fracture of right ankle, initial encounter  COVID    Rx / DC Orders ED Discharge Orders     None         Kem Parkinson, PA-C 05/07/21 1917    Milton Ferguson, MD 05/09/21 1007

## 2021-05-07 NOTE — Progress Notes (Shared)
Laurie Moreno [address] [place] [address]   Patient Care Team: Derek Jack, MD as PCP - General (Hematology) Danie Binder, MD (Inactive) as Consulting Physician (Gastroenterology)  SUMMARY OF ONCOLOGIC HISTORY: Oncology History  Breast cancer [facility])  11/28/2018 Initial Diagnosis   Breast cancer ([place])   02/23/2019 Cancer Staging   Staging form: Breast, AJCC 8th Edition - Clinical stage from 02/23/2019: Stage IA (cT1b, cN0(sn), cM0, G2, ER+, PR+, HER2-) - Signed by Derek Jack, MD on 02/23/2019      CHIEF COMPLIANT: ***   INTERVAL HISTORY: [name] is a 71 y.o. female here today for follow up of her ***. Her last visit was on {XX/XX/XXXX}. ***   REVIEW OF SYSTEMS:   Review of Systems - Oncology  I have reviewed the past medical history, past surgical history, social history and family history with the patient and they are unchanged from previous note.   ALLERGIES:   is allergic to codeine and penicillins.   MEDICATIONS:  Current Outpatient Medications  Medication Sig Dispense Refill   albuterol (VENTOLIN HFA) 108 (90 Base) MCG/ACT inhaler Inhale 2 puffs into the lungs as needed for wheezing or shortness of breath.     anastrozole (ARIMIDEX) 1 MG tablet TAKE 1 TABLET BY MOUTH EVERY DAY 90 tablet 3   aspirin EC 81 MG tablet Take 81 mg by mouth daily. Swallow whole.     atorvastatin (LIPITOR) 40 MG tablet Take 40 mg by mouth daily.      Calcium-Magnesium-Vitamin D (CALCIUM 1200+D3 PO) Take 1 tablet by mouth in the morning and at bedtime.     carbidopa-levodopa (SINEMET IR) 25-100 MG tablet Take 1 tablet by mouth 3 (three) times daily.      carvedilol (COREG) 3.125 MG tablet Take 3.125 mg by mouth 2 (two) times daily with a meal.     clonazePAM (KLONOPIN) 0.5 MG tablet Take 1 tablet (0.5 mg total) by mouth 2 (two) times daily. (Patient taking differently: Take 0.5 mg by mouth at bedtime.) 60 tablet 0   Denosumab (PROLIA  [place]) Inject into the skin every 6 (six) months.     divalproex (DEPAKOTE) 250 MG DR tablet Take 250 mg by mouth 3 (three) times daily. Do not crush     escitalopram (LEXAPRO) 20 MG tablet Take 20 mg by mouth daily.     gabapentin (NEURONTIN) 300 MG capsule Take 300 mg by mouth 2 (two) times daily.     hydrOXYzine (ATARAX/VISTARIL) 25 MG tablet Take 25 mg by mouth every 8 (eight) hours as needed for itching.      levothyroxine (SYNTHROID, LEVOTHROID) 125 MCG tablet Take 125 mcg by mouth daily before breakfast.     lisinopril (ZESTRIL) 2.5 MG tablet Take 2.5 mg by mouth daily.     loratadine (CLARITIN) 10 MG tablet Take 10 mg by mouth daily.     Multiple Vitamin (MULTIVITAMIN) tablet Take 1 tablet by mouth every morning.      pantoprazole (PROTONIX) 40 MG tablet TAKE 1 TABLET BY MOUTH EVERY DAY BEFORE BREAKFAST 90 tablet 3   primidone (MYSOLINE) 50 MG tablet Take 100 mg by mouth 2 (two) times daily.      No current facility-administered medications for this visit.     PHYSICAL EXAMINATION: Performance status (ECOG): {CHL ONC NM:[id number]}  There were no vitals filed for this visit. Wt Readings from Last 3 Encounters:  05/07/21 165 lb (74.8 kg)  04/30/21 155 lb 12.8 oz (70.7  kg)  10/30/20 171 lb 1.6 oz (77.6 kg)   Physical Exam  Breast Exam Chaperone: {Blank single:19197::"n/a","Daniel Khashchuk, MD","Kirstyn Evans"}     LABORATORY DATA:  I have reviewed the data as listed CMP Latest Ref Rng & Units 05/07/2021 04/30/2021 10/24/2020  Glucose 70 - 99 mg/dL 103(H) 93 103(H)  BUN 8 - 23 mg/dL $Remove'17 18 14  'raHKLwN$ Creatinine 0.44 - 1.00 mg/dL 0.72 0.74 0.71  Sodium 135 - 145 mmol/L 140 139 135  Potassium 3.5 - 5.1 mmol/L 4.8 4.7 4.5  Chloride 98 - 111 mmol/L 104 105 100  CO2 22 - 32 mmol/L $RemoveB'26 27 25  'skfNpfXv$ Calcium 8.9 - 10.3 mg/dL 8.6(L) 9.1 9.1  Total Protein 6.5 - 8.1 g/dL - 6.7 6.7  Total Bilirubin 0.3 - 1.2 mg/dL - 0.6 1.0  Alkaline Phos 38 - 126 U/L - 36(L) 40  AST 15 - 41 U/L - 17 22  ALT 0 -  44 U/L - 9 8   No results found for: IVR294 Lab Results  Component Value Date   WBC 5.0 05/07/2021   HGB 14.2 05/07/2021   HCT 42.4 05/07/2021   MCV 95.3 05/07/2021   PLT 130 (L) 05/07/2021   NEUTROABS 1.8 05/07/2021    ASSESSMENT:  ***   PLAN:  ***  Breast Cancer therapy associated bone loss: I have recommended calcium, Vitamin D and weight bearing exercises.  Orders placed this encounter:  No orders of the defined types were placed in this encounter.   The patient has a good understanding of the overall plan. She agrees with it. She will call with any problems that may develop before the next visit here.  Derek Jack, MD [facility] [phone]   I, ***, am acting as a scribe for Dr. Derek Jack.  {Add Barista Statement}

## 2021-05-07 NOTE — ED Notes (Signed)
Patient transported to CT 

## 2021-05-07 NOTE — H&P (Addendum)
TRH H&P   Patient Demographics:    Laurie Moreno, is a 71 y.o. female  MRN: [id number]   DOB - 03-05-1951  Admit Date - 05/07/2021  Outpatient Primary MD for the patient is Derek Jack, MD  Referring MD/NP/PA: PA Arthur  Patient coming from: home  Chief Complaint  Patient presents with   Fall   Ankle Pain      HPI:    Laurie Moreno  is a 71 y.o. female, with past medical history of bipolar disorder, tremors, CHF, COPD, breast cancer, patient presents to ED secondary to complaints of right ankle pain and deformity after a fall, patient reports she was visiting her psychiatric office, when it was raining, and she slipped due to rain, and fell backward, and struck the back of the head and twisted her right ankle, she had immediate ankle pain, unable to bear weight, she denies any loss of consciousness, fever, chills, neck pain, dizziness or lightheadedness, she denies any hip injury, low back injury, no knee pain. -In ED significant for no labs abnormalities, x-ray significant for bimalleolar right ankle fracture, as well incidental finding of COVID-19 positive test, patient reports she is vaccinated x2, boosted x3, [facility] hospitalist consulted to admit.    Review of systems:    In addition to the HPI above,  No Fever-chills, No Headache, No changes with Vision or hearing, No problems swallowing food or Liquids, No Chest pain, Cough or Shortness of Breath, No Abdominal pain, No Nausea or Vommitting, Bowel movements are regular, No Blood in stool or Urine, No dysuria, No new skin rashes or bruises, Right ankle pain due to fall No new weakness, tingling, numbness in any extremity, No recent weight gain or loss, No polyuria, polydypsia or polyphagia, No significant Mental Stressors.  A full 10 point Review of Systems was done, except as stated above, all other Review of  Systems were negative.   With Past History of the following :    Past Medical History:  Diagnosis Date   Acute pyelonephritis    Anxiety    Bipolar disorder, unspecified ([place])    From [place] form   CHF (congestive heart failure) ([place])    see LOV 06-25-16 epic care everywhere ; "sister reports that patient has a 'weak heart'  but they did testing and she hasnt had to go back since"   Chronic respiratory failure with hypoxia ([place])    Complication of anesthesia    reports with last surgery on 09-07-17 it took 3 hours for her to wake up and she was on the ventilator post operatively for an additional  8 hours    COPD (chronic obstructive pulmonary disease) ([place])    Dermatitis    Dermatitis    Dysrhythmia    Elevated temperature 10/26/2017   temp of 99.9 , denies cold/flu sx; reports she gets frwquent UTIs, patient sister accompanying today  reports she is going to Dr Gloriann Loan (surgeon) office today  for pre-op appt  at 2pm   Essential tremor    Family history of adverse reaction to anesthesia    sister gets nausea and vomiting    Fever    History of ESBL E. coli infection    10-26-17 this RN did not enter this ESBL inf hx; patient and sister deny hx of ESBL infection and there is no supporting lab work for ESBL infection in epic    Hyperlipidemia    Hypothyroidism    Thrombocytopenia ([place])    Unspecified hydronephrosis    Urinary incontinence       Past Surgical History:  Procedure Laterality Date   BIOPSY  10/12/2019   Procedure: BIOPSY;  Surgeon: Daneil Dolin, MD;  Location: [facility];  Service: Endoscopy;;   CARDIAC CATHETERIZATION  07/15/2016   at [facility]   COLONOSCOPY  12/2013   Dr. Aaron Edelman C. Lewis at [facility], long tortuous colon, nonbleeding external hemorrhoids, questionable small subcentimeter benign/hyperplastic polyp in the ascending colon, not removed.  Advised to have repeat colonoscopy in 4 years.   COLONOSCOPY WITH PROPOFOL N/A  10/12/2019   [place]: 1.5 cm verrucous, raised nodule upper end of her gluteal folds.  Scattered diverticula.  3 sessile polyps removed, tubular adenomas.  Next colonoscopy in 5 years.   CYSTOSCOPY W/ URETERAL STENT PLACEMENT Left 09/07/2017   Procedure: CYSTOSCOPY WITH RETROGRADE PYELOGRAM/URETERAL STENT PLACEMENT;  Surgeon: Lucas Mallow, MD;  Location: [facility];  Service: Urology;  Laterality: Left;   ESOPHAGOGASTRODUODENOSCOPY (EGD) WITH PROPOFOL N/A 10/12/2019   [place]: Portal hypertensive gastropathy.  Linear gastric erosions.  No varices.  Biopsy showed reactive gastropathy.  No H. pylori.  Repeat EGD in 2 years to screen for varices.   EYE SURGERY  5 years ago    bilateral cataract extraction    eyelid lift Bilateral    LAPAROSCOPIC NEPHRECTOMY, HAND ASSISTED Left 11/01/2017   Procedure: LEFT HAND ASSISTED LAPAROSCOPIC NEPHRECTOMY;  Surgeon: Lucas Mallow, MD;  Location: [facility];  Service: Urology;  Laterality: Left;   MASTECTOMY MODIFIED RADICAL Left 01/23/2019   Procedure: LEFT MODIFIED RADICAL MASTECTOMY;  Surgeon: Aviva Signs, MD;  Location: [facility];  Service: General;  Laterality: Left;   POLYPECTOMY  10/12/2019   Procedure: POLYPECTOMY;  Surgeon: Daneil Dolin, MD;  [facility];  Service: Endoscopy;;      Social History:     Social History   Tobacco Use   Smoking status: Some Days    Packs/day: 0.50    Years: 45.00    Pack years: 22.50    Types: Cigarettes   Smokeless tobacco: Never   Tobacco comments:    smokes 8 cigarettes per day 02/01/2020  Substance Use Topics   Alcohol use: Not Currently    Comment: no prior history heavy etoh use     Lives -at home with sister  Mobility -no assistance at baseline    Family History :     Family History  Problem Relation Age of Onset   Colon cancer Mother        died at age 50   Colon cancer Father        died in his 13s   Tremor Father    Melanoma Brother    Colon cancer Paternal Grandmother         age 63   Colon cancer Paternal Grandfather  in his 55s   Liver cancer Maternal Grandfather        died age 76      Home Medications:   Prior to Admission medications   Medication Sig Start Date End Date Taking? Authorizing Provider  albuterol (VENTOLIN HFA) 108 (90 Base) MCG/ACT inhaler Inhale 2 puffs into the lungs as needed for wheezing or shortness of breath.    [provider]  anastrozole (ARIMIDEX) 1 MG tablet TAKE 1 TABLET BY MOUTH EVERY DAY 02/02/21   Derek Jack, MD  aspirin EC 81 MG tablet Take 81 mg by mouth daily. Swallow whole.    [provider]  atorvastatin (LIPITOR) 40 MG tablet Take 40 mg by mouth daily.     [provider]  Calcium-Magnesium-Vitamin D (CALCIUM 1200+D3 PO) Take 1 tablet by mouth in the morning and at bedtime.    [provider]  carbidopa-levodopa (SINEMET IR) 25-100 MG tablet Take 1 tablet by mouth 3 (three) times daily.  10/04/18 10/30/20  [provider]  carvedilol (COREG) 3.125 MG tablet Take 3.125 mg by mouth 2 (two) times daily with a meal.    [provider]  clonazePAM (KLONOPIN) 0.5 MG tablet Take 1 tablet (0.5 mg total) by mouth 2 (two) times daily. Patient taking differently: Take 0.5 mg by mouth at bedtime. 09/17/17   Wille Celeste, PA-C  Denosumab (PROLIA [place]) Inject into the skin every 6 (six) months.    [provider]  divalproex (DEPAKOTE) 250 MG DR tablet Take 250 mg by mouth 3 (three) times daily. Do not crush    [provider]  escitalopram (LEXAPRO) 20 MG tablet Take 20 mg by mouth daily. 12/20/18   [provider]  gabapentin (NEURONTIN) 300 MG capsule Take 300 mg by mouth 2 (two) times daily.    [provider]  hydrOXYzine (ATARAX/VISTARIL) 25 MG tablet Take 25 mg by mouth every 8 (eight) hours as needed for itching.     [provider]  levothyroxine (SYNTHROID, LEVOTHROID) 125 MCG tablet Take 125 mcg by mouth daily before  breakfast.    [provider]  lisinopril (ZESTRIL) 2.5 MG tablet Take 2.5 mg by mouth daily. 07/02/20   [provider]  loratadine (CLARITIN) 10 MG tablet Take 10 mg by mouth daily.    [provider]  Multiple Vitamin (MULTIVITAMIN) tablet Take 1 tablet by mouth every morning.     [provider]  oxybutynin (DITROPAN-XL) 5 MG 24 hr tablet Take 5 mg by mouth daily. 04/22/21   [provider]  pantoprazole (PROTONIX) 40 MG tablet TAKE 1 TABLET BY MOUTH EVERY DAY BEFORE BREAKFAST 02/27/21   Mahala Menghini, PA-C  primidone (MYSOLINE) 50 MG tablet Take 100 mg by mouth 2 (two) times daily.     [provider]     Allergies:     Allergies  Allergen Reactions   Codeine Nausea And Vomiting    In cough syrup cause nausea and vomiting   Penicillins Hives, Rash and Other (See Comments)    Did it involve swelling of the face/tongue/throat, SOB, or low BP? Unknown Did it involve sudden or severe rash/hives, skin peeling, or any reaction on the inside of your mouth or nose? Unknown Did you need to seek medical attention at a hospital or doctor's office? Unknown When did it last happen?    as a child If all above answers are NO, may proceed with cephalosporin use.      Physical  Exam:   Vitals  Blood pressure 139/76, pulse 89, temperature 98.8 F (37.1 C), temperature source Oral, resp. rate 19, height 5\' 4"  (1.626 m), weight 74.8 kg, SpO2 93 %.   1. General developed female, laying in bed in no apparent distress  2. Normal affect and insight, Not Suicidal or Homicidal, Awake Alert, Oriented X 3.  3. No F.N deficits, ALL C.Nerves Intact, Strength 5/5 all 4 extremities, Sensation intact all 4 extremities, Plantars down going.  4. Ears and Eyes appear Normal, Conjunctivae clear, PERRLA. Moist Oral Mucosa.  5. Supple Neck, No JVD, No cervical lymphadenopathy appriciated, No Carotid Bruits.  6. Symmetrical Chest wall movement, Good air  movement bilaterally, CTAB.  7. RRR, No Gallops, Rubs or Murmurs, No Parasternal Heave.  8. Positive Bowel Sounds, Abdomen Soft, No tenderness, No organomegaly appriciated,No rebound -guarding or rigidity.  9.  No Cyanosis, Normal Skin Turgor, No Skin Rash or Bruise.  10. Good muscle tone,  right lower extremity in splint  11. No Palpable Lymph Nodes in Neck or Axillae    Data Review:    CBC Recent Labs  Lab 05/07/21 1403  WBC 5.0  HGB 14.2  HCT 42.4  PLT 130*  MCV 95.3  MCH 31.9  MCHC 33.5  RDW 12.7  LYMPHSABS 2.5  MONOABS 0.5  EOSABS 0.1  BASOSABS 0.0   ------------------------------------------------------------------------------------------------------------------  Chemistries  Recent Labs  Lab 05/07/21 1403  NA 140  K 4.8  CL 104  CO2 26  GLUCOSE 103*  BUN 17  CREATININE 0.72  CALCIUM 8.6*   ------------------------------------------------------------------------------------------------------------------ estimated creatinine clearance is 64.8 mL/min (by C-G formula based on SCr of 0.72 mg/dL). ------------------------------------------------------------------------------------------------------------------ No results for input(s): TSH, T4TOTAL, T3FREE, THYROIDAB in the last 72 hours.  Invalid input(s): FREET3  Coagulation profile No results for input(s): INR, PROTIME in the last 168 hours. ------------------------------------------------------------------------------------------------------------------- No results for input(s): DDIMER in the last 72 hours. -------------------------------------------------------------------------------------------------------------------  Cardiac Enzymes No results for input(s): CKMB, TROPONINI, MYOGLOBIN in the last 168 hours.  Invalid input(s): CK ------------------------------------------------------------------------------------------------------------------ No results found for:  BNP   ---------------------------------------------------------------------------------------------------------------  Urinalysis    Component Value Date/Time   COLORURINE YELLOW 03/24/2018 0530   APPEARANCEUR CLEAR 03/24/2018 0530   LABSPEC 1.013 03/24/2018 0530   PHURINE 6.0 03/24/2018 0530   GLUCOSEU NEGATIVE 03/24/2018 0530   HGBUR SMALL (A) 03/24/2018 0530   BILIRUBINUR small (A) 02/14/2021 1450   KETONESUR negative 02/14/2021 1450   KETONESUR NEGATIVE 03/24/2018 0530   PROTEINUR >=300 (A) 02/14/2021 1450   PROTEINUR NEGATIVE 03/24/2018 0530   UROBILINOGEN 0.2 02/14/2021 1450   NITRITE Positive (A) 02/14/2021 1450   NITRITE NEGATIVE 03/24/2018 0530   LEUKOCYTESUR Small (1+) (A) 02/14/2021 1450    ----------------------------------------------------------------------------------------------------------------   Imaging Results:    DG Ankle Complete Right  Result Date: 05/07/2021 CLINICAL DATA:  Right ankle pain and swelling after fall. EXAM: RIGHT ANKLE - COMPLETE 3+ VIEW COMPARISON:  None. FINDINGS: Acute fracture of the medial malleolus with mild medial and inferior displacement. Acute comminuted fracture of the distal fibular metaphysis with mild lateral displacement and overriding. No obvious posterior malleolar fracture. Asymmetric widening of the ankle mortise. No dislocation. Joint spaces are preserved. Diffuse soft tissue swelling, more prominent laterally. IMPRESSION: 1. Acute bimalleolar fractures with ankle instability. Electronically Signed   By: Titus Dubin M.D.   On: 05/07/2021 15:22   CT Head Wo Contrast  Result Date: 05/07/2021 CLINICAL DATA:  Head trauma.  Status post fall.  Hit back of head EXAM: CT  HEAD WITHOUT CONTRAST TECHNIQUE: Contiguous axial images were obtained from the base of the skull through the vertex without intravenous contrast. COMPARISON:  MRI brain 08/06/2017. FINDINGS: Brain: No evidence of acute infarction, hemorrhage, hydrocephalus,  extra-axial collection or mass lesion/mass effect. There is mild diffuse low-attenuation within the subcortical and periventricular white matter compatible with chronic microvascular disease. Prominence of the sulci and ventricles compatible with brain atrophy. Vascular: No hyperdense vessel or unexpected calcification. Skull: Normal. Negative for fracture or focal lesion. Sinuses/Orbits: No acute finding. Other: None. IMPRESSION: 1. No acute intracranial abnormalities. 2. Mild chronic small vessel ischemic change and brain atrophy. Electronically Signed   By: Kerby Moors M.D.   On: 05/07/2021 15:46      Assessment & Plan:    Principal Problem:   Ankle fracture Active Problems:   Bipolar 1 disorder (HCC)   COPD (chronic obstructive pulmonary disease) (HCC)   Breast cancer (HCC)   GERD (gastroesophageal reflux disease)   Right ankle fracture -Secondary to mechanical fall -Management per orthopedic, there is no COVID beds available at [facility], so patient [facility] be [facility] to [facility] due to COVID-19 bed availability. -Management per orthopedic surgery, Dr. Mable Fill to see in a.m. regarding further recommendations -For now we will keep on as needed pain medication, DVT prophylaxis -We will consult PT/OT  COVID-19 infection -This is an incidental finding, she is [place] x2, boosted x3, denies any symptoms -No hypoxia, no indication for steroids -She will be started on oral antiviral therapy molnupiravir  History of osteoporosis -Start reports she did receive Prolia last week  Hypertension -Continue with Coreg, will hold lisinopril for now.  COPD -No active wheezing, continue with albuterol as needed  History of breast cancer -continue with anastrozole  hyperlipidemia -Continue with statin  Bipolar disorder -Continue with depakote, Lexapro , gabapentin, and Klonopin  Hand tremors -On primidone -Patient with no history of Parkinson, but she does not  appear to be on Sinemet, unclear this is due to hand tremors, but I will continue her home meds for now.  Hypothyroidism -Continue with Synthroid    DVT Prophylaxis Heparin   AM Labs Ordered, also please review Full Orders  Family Communication: Admission, patients condition and plan of care including tests being ordered have been discussed with the patient and sister at bedside  who indicate understanding and agree with the plan and Code Status.  Code Status full  Likely DC to  pending ortho/pt/ot  Condition GUARDED    Consults called: D/W Dr Mable Fill from ortho  Admission status: inpatient    Time spent in minutes : 80 minutes   Phillips Climes M.D on 05/07/2021 at 8:42 PM   [facility] - Office  [phone]

## 2021-05-07 NOTE — ED Triage Notes (Signed)
Patient states she was walking into her PCP when she tripped and fell in the lobby, twist her right ankle and hitting the back of her head. Patient denies LOC and has a C-collar in place on arrival.

## 2021-05-07 NOTE — ED Triage Notes (Signed)
Swelling and bruising noted to right lateral ankle.

## 2021-05-07 NOTE — ED Notes (Signed)
[phone] sister [name] wants to be updated on any change in the pt status or transfer. [phone] alternate number to reach [place] or husband Kasandra Knudsen.

## 2021-05-08 ENCOUNTER — Ambulatory Visit (HOSPITAL_COMMUNITY): Payer: Medicare Other | Admitting: Hematology

## 2021-05-08 DIAGNOSIS — I10 Essential (primary) hypertension: Secondary | ICD-10-CM

## 2021-05-08 DIAGNOSIS — W19XXXA Unspecified fall, initial encounter: Secondary | ICD-10-CM

## 2021-05-08 DIAGNOSIS — J449 Chronic obstructive pulmonary disease, unspecified: Secondary | ICD-10-CM

## 2021-05-08 DIAGNOSIS — D696 Thrombocytopenia, unspecified: Secondary | ICD-10-CM

## 2021-05-08 LAB — CBC
HCT: 37.6 % (ref 36.0–46.0)
Hemoglobin: 12.4 g/dL (ref 12.0–15.0)
MCH: 30.5 pg (ref 26.0–34.0)
MCHC: 33 g/dL (ref 30.0–36.0)
MCV: 92.6 fL (ref 80.0–100.0)
Platelets: 116 10*3/uL — ABNORMAL LOW (ref 150–400)
RBC: 4.06 MIL/uL (ref 3.87–5.11)
RDW: 12.7 % (ref 11.5–15.5)
WBC: 5.6 10*3/uL (ref 4.0–10.5)
nRBC: 0 % (ref 0.0–0.2)

## 2021-05-08 LAB — BASIC METABOLIC PANEL
Anion gap: 7 (ref 5–15)
BUN: 15 mg/dL (ref 8–23)
CO2: 25 mmol/L (ref 22–32)
Calcium: 8.3 mg/dL — ABNORMAL LOW (ref 8.9–10.3)
Chloride: 106 mmol/L (ref 98–111)
Creatinine, Ser: 0.61 mg/dL (ref 0.44–1.00)
GFR, Estimated: >60 mL/min (ref >60)
Glucose, Bld: 172 mg/dL — ABNORMAL HIGH (ref 70–99)
Potassium: 4.3 mmol/L (ref 3.5–5.1)
Sodium: 138 mmol/L (ref 135–145)

## 2021-05-08 LAB — SURGICAL PCR SCREEN
MRSA, PCR: POSITIVE — AB
Staphylococcus aureus: POSITIVE — AB

## 2021-05-08 LAB — HIV ANTIBODY (ROUTINE TESTING W REFLEX): HIV Screen 4th Generation wRfx: NONREACTIVE

## 2021-05-08 MED ORDER — CHLORHEXIDINE GLUCONATE 4 % EX LIQD
60.0000 mL | Freq: Once | CUTANEOUS | Status: DC
  Filled 2021-05-08: qty 60

## 2021-05-08 MED ORDER — CEFAZOLIN SODIUM-DEXTROSE 2-4 GM/100ML-% IV SOLN: 2.0000 g | INTRAVENOUS | Status: DC

## 2021-05-08 MED ORDER — POVIDONE-IODINE 10 % EX SWAB: 2.0000 "application " | Freq: Once | CUTANEOUS | Status: DC

## 2021-05-08 MED ORDER — VANCOMYCIN HCL IN DEXTROSE 1-5 GM/200ML-% IV SOLN: 1000.0000 mg | INTRAVENOUS | Status: DC

## 2021-05-08 MED ORDER — CHLORHEXIDINE GLUCONATE CLOTH 2 % EX PADS
6.0000 | MEDICATED_PAD | Freq: Every day | CUTANEOUS | Status: AC
  Administered 2021-05-08 – 2021-05-12 (×4): 6 via TOPICAL

## 2021-05-08 MED ORDER — MUPIROCIN 2 % EX OINT
1.0000 "application " | TOPICAL_OINTMENT | Freq: Two times a day (BID) | CUTANEOUS | Status: AC
  Administered 2021-05-08 – 2021-05-12 (×9): 1 via NASAL
  Filled 2021-05-08 (×3): qty 22

## 2021-05-08 NOTE — Progress Notes (Signed)
°  Transition of Care [facility]) Screening Note   Patient Details  Name: Laurie Moreno Date of Birth: March 15, 1951   Transition of Care [facility]) CM/SW Contact:    Sharin Mons, RN Phone Number: 05/08/2021, 5:54 PM   Admitted with R ankle fx ,ORIF scheduled  tomorrow with Dr. Doreatha Martin, 1/6.  From home with supportive family ( sister and brother in law). Pt independent with ADL's , no DME usage. PCP:Dr. Venia Minks, [facility].  Transition of Care [facility]) has reviewed patient and will continue to monitor patient advancement through interdisciplinary progression rounds.  Whitman Hero RN,BSN,CM

## 2021-05-08 NOTE — Progress Notes (Signed)
PROGRESS NOTE  Laurie Moreno IRS:[id number] DOB: 28-Jan-1951   PCP: Derek Jack, MD  Patient is from: Home.  Lives with sister.  Independently ambulates at baseline.  Does not drive.  DOA: 05/07/2021 LOS: 1  Chief complaints:  Chief Complaint  Patient presents with   Fall   Ankle Pain     Brief Narrative / Interim history: 71 year old F with PMH of diastolic CHF, bipolar disorder, hand tremor, COPD and left breast cancer status postmastectomy presenting to [facility], ED with right ankle pain and deformity after she had an accidental fall on the way to her psychiatrist office. She slipped due to rain, and fell backward, and struck the back of the head and twisted her right ankle, she had immediate ankle pain, unable to bear weight, she denies any loss of consciousness.  X-ray showed bimalleolar right ankle fracture.  She also tested positive for COVID-19.  She is not symptomatic from COVID infection.  She is vaccinated and boosted.  Transferred to [facility] due to lack of [place] room at [facility].  Orthopedic surgery following.  Plan for ORIF either on 1/6 or 1/7 as the swelling allows.   Subjective: Seen and examined earlier this morning.  No major events overnight of this morning.  No complaints.  Pain fairly controlled.  No respiratory symptoms.  Denies GI or UTI symptoms.  Objective: Vitals:   05/07/21 2015 05/07/21 2030 05/07/21 2249 05/08/21 0838  BP:  139/76 123/71 118/75  Pulse: 90 89 88 82  Resp: (!) 23 19 18 18   Temp:   100.1 F (37.8 C) 98.4 F (36.9 C)  TempSrc:   Oral Oral  SpO2: 93% 93% 92% 92%  Weight:      Height:        Examination:  GENERAL: No apparent distress.  Nontoxic. HEENT: MMM.  Vision and hearing grossly intact.  NECK: Supple.  No apparent JVD.  RESP:  No IWOB.  Fair aeration bilaterally. CVS:  RRR. Heart sounds normal.  ABD/GI/GU: BS+. Abd soft, NTND.  MSK/EXT:  Moves extremities.  Dressing over RLE/right foot.  Neurovascular intact. SKIN: no  apparent skin lesion or wound NEURO: Awake, alert and oriented appropriately.  No apparent focal neuro deficit. PSYCH: Calm. Normal affect.   Procedures:  None  Microbiology summarized: COVID-19 PCR positive. Influenza PCR negative.  Assessment & Plan: Accidental fall-slipped and fell Right ankle fracture/bimalleolar fracture -Orthopedic surgery following. -Plan for ORIF either on 1/6 or 1/7 as the swelling allows. -Pain control   COVID-19 infection-incidental finding.  Vaccinated x2 and booster x3.  As symptomatic. -Continue airborne and contact precaution for 10 days per protocol -No indication for treatment.   History of osteoporosis -Start reports she did receive Prolia last week   Hypertension: Normotensive. -Continue low-dose Coreg. -Continue holding lisinopril.   Chronic COPD: No respiratory symptoms. -Continue albuterol as needed.   History of breast cancer s/p left mastectomy -continue with anastrozole -Outpatient follow-up with oncologist.   hyperlipidemia -Continue with statin   Bipolar disorder: Stable. -Continue with depakote, Lexapro , gabapentin, and Klonopin   Hand tremors -On primidone   Hypothyroidism -Continue with Synthroid  Chronic thrombocytopenia: Platelets slightly lower than baseline. -Continue monitoring  Body mass index is 28.32 kg/m.         DVT prophylaxis:  heparin injection 5,000 Units Start: 05/08/21 0600 SCDs Start: 05/07/21 2238  Code Status: Full code Family Communication: Patient and/or RN. Available if any question.  Level of care: Med-Surg Status is: Inpatient  Remains  inpatient appropriate because: Need for surgical intervention for right ankle fracture   Final disposition: TBD after surgery.    Consultants:  Orthopedic surgery   Sch Meds:  Scheduled Meds:  anastrozole  1 mg Oral Daily   atorvastatin  40 mg Oral Daily   carbidopa-levodopa  1.5 tablet Oral TID   carvedilol  3.125 mg Oral BID WC    Chlorhexidine Gluconate Cloth  6 each Topical Q0600   clonazePAM  0.5 mg Oral QHS   divalproex  250 mg Oral TID   escitalopram  20 mg Oral Daily   gabapentin  300 mg Oral BID   heparin  5,000 Units Subcutaneous Q8H   levothyroxine  125 mcg Oral Q0600   molnupiravir EUA  4 capsule Oral BID   multivitamin with minerals  1 tablet Oral q morning   mupirocin ointment  1 application Nasal BID   oxybutynin  5 mg Oral Daily   pantoprazole  40 mg Oral Daily   primidone  100 mg Oral BID   Continuous Infusions: PRN Meds:.acetaminophen **OR** acetaminophen, albuterol, morphine injection, oxyCODONE  Antimicrobials: Anti-infectives (From admission, onward)    Start     Dose/Rate Route Frequency Ordered Stop   05/07/21 2345  molnupiravir EUA (LAGEVRIO) capsule 800 mg        4 capsule Oral 2 times daily 05/07/21 2124 05/12/21 2159   05/07/21 2200  nirmatrelvir/ritonavir EUA (PAXLOVID) 3 tablet  Status:  Discontinued        3 tablet Oral 2 times daily 05/07/21 2110 05/07/21 2119        I have personally reviewed the following labs and images: CBC: Recent Labs  Lab 05/07/21 1403 05/08/21 0301  WBC 5.0 5.6  NEUTROABS 1.8  --   HGB 14.2 12.4  HCT 42.4 37.6  MCV 95.3 92.6  PLT 130* 116*   BMP &GFR Recent Labs  Lab 05/07/21 1403 05/08/21 0301  NA 140 138  K 4.8 4.3  CL 104 106  CO2 26 25  GLUCOSE 103* 172*  BUN 17 15  CREATININE 0.72 0.61  CALCIUM 8.6* 8.3*   Estimated Creatinine Clearance: 64.8 mL/min (by C-G formula based on SCr of 0.61 mg/dL). Liver & Pancreas: No results for input(s): AST, ALT, ALKPHOS, BILITOT, PROT, ALBUMIN in the last 168 hours. No results for input(s): LIPASE, AMYLASE in the last 168 hours. No results for input(s): AMMONIA in the last 168 hours. Diabetic: No results for input(s): HGBA1C in the last 72 hours. No results for input(s): GLUCAP in the last 168 hours. Cardiac Enzymes: No results for input(s): CKTOTAL, CKMB, CKMBINDEX, TROPONINI in the  last 168 hours. No results for input(s): PROBNP in the last 8760 hours. Coagulation Profile: No results for input(s): INR, PROTIME in the last 168 hours. Thyroid Function Tests: No results for input(s): TSH, T4TOTAL, FREET4, T3FREE, THYROIDAB in the last 72 hours. Lipid Profile: No results for input(s): CHOL, HDL, LDLCALC, TRIG, CHOLHDL, LDLDIRECT in the last 72 hours. Anemia Panel: No results for input(s): VITAMINB12, FOLATE, FERRITIN, TIBC, IRON, RETICCTPCT in the last 72 hours. Urine analysis:    Component Value Date/Time   COLORURINE YELLOW 03/24/2018 0530   APPEARANCEUR CLEAR 03/24/2018 0530   LABSPEC 1.013 03/24/2018 0530   PHURINE 6.0 03/24/2018 0530   GLUCOSEU NEGATIVE 03/24/2018 0530   HGBUR SMALL (A) 03/24/2018 0530   BILIRUBINUR small (A) 02/14/2021 1450   KETONESUR negative 02/14/2021 1450   KETONESUR NEGATIVE 03/24/2018 0530   PROTEINUR >=300 (A) 02/14/2021 1450  PROTEINUR NEGATIVE 03/24/2018 0530   UROBILINOGEN 0.2 02/14/2021 1450   NITRITE Positive (A) 02/14/2021 1450   NITRITE NEGATIVE 03/24/2018 0530   LEUKOCYTESUR Small (1+) (A) 02/14/2021 1450   Sepsis Labs: Invalid input(s): PROCALCITONIN, [place]  Microbiology: Recent Results (from the past 240 hour(s))  Resp Panel by RT-PCR (Flu A&B, Covid) Nasopharyngeal Swab     Status: Abnormal   Collection Time: 05/07/21  5:04 PM   Specimen: Nasopharyngeal Swab; Nasopharyngeal(NP) swabs in vial transport medium  Result Value Ref Range Status   SARS Coronavirus 2 by RT PCR POSITIVE (A) NEGATIVE Final    Comment: (NOTE) SARS-CoV-2 target nucleic acids are DETECTED.  The SARS-CoV-2 RNA is generally detectable in upper respiratory specimens during the acute phase of infection. Positive results are indicative of the presence of the identified virus, but do not rule out bacterial infection or co-infection with other pathogens not detected by the test. Clinical correlation with patient history and other  diagnostic information is necessary to determine patient infection status. The expected result is Negative.  Fact Sheet for Patients: EntrepreneurPulse.com.au  Fact Sheet for Healthcare Providers: IncredibleEmployment.be  This test is not yet approved or cleared by the [place] FDA and  has been authorized for detection and/or diagnosis of SARS-CoV-2 by FDA under an Emergency Use Authorization (EUA).  This EUA will remain in effect (meaning this test can be used) for the duration of  the COVID-19 declaration under Section 564(b)(1) of the A ct, 21 U.S.C. section 360bbb-3(b)(1), unless the authorization is terminated or revoked sooner.     Influenza A by PCR NEGATIVE NEGATIVE Final   Influenza B by PCR NEGATIVE NEGATIVE Final    Comment: (NOTE) The Xpert Xpress SARS-CoV-2/FLU/RSV plus assay is intended as an aid in the diagnosis of influenza from Nasopharyngeal swab specimens and should not be used as a sole basis for treatment. Nasal washings and aspirates are unacceptable for Xpert Xpress SARS-CoV-2/FLU/RSV testing.  Fact Sheet for Patients: EntrepreneurPulse.com.au  Fact Sheet for Healthcare Providers: IncredibleEmployment.be  This test is not yet approved or cleared by the [place] FDA and has been authorized for detection and/or diagnosis of SARS-CoV-2 by FDA under an Emergency Use Authorization (EUA). This EUA will remain in effect (meaning this test can be used) for the duration of the COVID-19 declaration under Section 564(b)(1) of the Act, 21 U.S.C. section 360bbb-3(b)(1), unless the authorization is terminated or revoked.  Performed at [facility], [address] [place], [place] [address]   Surgical pcr screen     Status: Abnormal   Collection Time: 05/07/21 11:45 PM   Specimen: Nasal Mucosa; Nasal Swab  Result Value Ref Range Status   MRSA, PCR POSITIVE (A) NEGATIVE Final     Comment: RESULT CALLED TO, READ BACK BY AND VERIFIED WITH: J ATANBILA,RN@[id number]  05/08/21 [place]    Staphylococcus aureus POSITIVE (A) NEGATIVE Final    Comment: (NOTE) The Xpert SA Assay (FDA approved for NASAL specimens in patients 52 years of age and older), is one component of a comprehensive surveillance program. It is not intended to diagnose infection nor to guide or monitor treatment. Performed at [facility], [place] [address] [place], [place] [address]     Radiology Studies: DG Ankle Complete Right  Result Date: 05/07/2021 CLINICAL DATA:  Right ankle pain and swelling after fall. EXAM: RIGHT ANKLE - COMPLETE 3+ VIEW COMPARISON:  None. FINDINGS: Acute fracture of the medial malleolus with mild medial and inferior displacement. Acute comminuted fracture of the distal  fibular metaphysis with mild lateral displacement and overriding. No obvious posterior malleolar fracture. Asymmetric widening of the ankle mortise. No dislocation. Joint spaces are preserved. Diffuse soft tissue swelling, more prominent laterally. IMPRESSION: 1. Acute bimalleolar fractures with ankle instability. Electronically Signed   By: Titus Dubin M.D.   On: 05/07/2021 15:22   CT Head Wo Contrast  Result Date: 05/07/2021 CLINICAL DATA:  Head trauma.  Status post fall.  Hit back of head EXAM: CT HEAD WITHOUT CONTRAST TECHNIQUE: Contiguous axial images were obtained from the base of the skull through the vertex without intravenous contrast. COMPARISON:  MRI brain 08/06/2017. FINDINGS: Brain: No evidence of acute infarction, hemorrhage, hydrocephalus, extra-axial collection or mass lesion/mass effect. There is mild diffuse low-attenuation within the subcortical and periventricular white matter compatible with chronic microvascular disease. Prominence of the sulci and ventricles compatible with brain atrophy. Vascular: No hyperdense vessel or unexpected calcification. Skull: Normal. Negative for fracture or focal lesion.  Sinuses/Orbits: No acute finding. Other: None. IMPRESSION: 1. No acute intracranial abnormalities. 2. Mild chronic small vessel ischemic change and brain atrophy. Electronically Signed   By: Kerby Moors M.D.   On: 05/07/2021 15:46       Jhade Berko T. [place]  If 7PM-7AM, please contact night-coverage [email or web address] 05/08/2021, 12:03 PM

## 2021-05-08 NOTE — TOC CAGE-AID Note (Addendum)
Transition of Care [facility]) - CAGE-AID Screening   Patient Details  Name: Laurie Moreno MRN: [id number] Date of Birth: [birth year]  Transition of Care [facility]) CM/SW Contact:    Laurie Moreno, Laurie Moreno Phone Number: 05/08/2021, 1:19 PM   Clinical Narrative: Pt participated in [place].  Pt stated she does not use substance or ETOH.  Pt states she smokes cigarettes.  Pt was offered resources, due to usage of substance.   Laurie Moreno, MSW, LCSW-A Pronouns:  She/Her/Hers Laurie Moreno Transitions of Care Clinical Social Worker Direct Number:  [phone] [email or web address]    CAGE-AID Screening:    Have You Ever Felt You Ought to Cut Down on Your Drinking or Drug Use?: Yes Have People Annoyed You By Critizing Your Drinking Or Drug Use?: No Have You Felt Bad Or Guilty About Your Drinking Or Drug Use?: No Have You Ever Had a Drink or Used Drugs First Thing In The Morning to Steady Your Nerves or to Get Rid of a Hangover?: No CAGE-AID Score: 1  Substance Abuse Education Offered: Yes  Substance abuse interventions: Scientist, clinical (histocompatibility and immunogenetics)

## 2021-05-08 NOTE — Consult Note (Addendum)
Patient seen and evaluated by myself this evening.  Unfortunately given or availability Dr. Doreatha Martin was unable to perform her surgery today and I was asked to take over care.  I agree with note by Hilbert Odor.  Patient with a closed right bimalleolar ankle fracture currently immobilized in a splint.  Given unstable fracture pattern operative fixation is recommended.The risks benefits and alternatives were discussed with the patient including but not limited to the risks of nonoperative treatment, versus surgical intervention including infection, bleeding, nerve injury, malunion, nonunion, the need for revision surgery, hardware prominence, hardware failure, the need for hardware removal, blood clots, cardiopulmonary complications, morbidity, mortality, among others, and they were willing to proceed.   Reason for Consult:Right ankle fx Referring Physician: Wendee Beavers Time called: 0730 Time at bedside: [place] is an 71 y.o. female.  HPI: Laurie Moreno was walking into a doctor's office and slipped on a wet entryway. As she fell her feet got twisted and she had immediate right ankle pain and could not get up or bear weight. She was taken to [facility] where x-rays showed an ankle fx and orthopedic surgery was consulted. She was transferred to [facility] for definitive care. She lives with family and does not use any assistive devices to ambulate.  Past Medical History:  Diagnosis Date   Acute pyelonephritis    Anxiety    Bipolar disorder, unspecified ([place])    From [place] form   CHF (congestive heart failure) ([place])    see LOV 06-25-16 epic care everywhere ; "sister reports that patient has a 'weak heart'  but they did testing and she hasnt had to go back since"   Chronic respiratory failure with hypoxia ([place])    Complication of anesthesia    reports with last surgery on 09-07-17 it took 3 hours for her to wake up and she was on the ventilator post operatively for an additional  8 hours    COPD (chronic  obstructive pulmonary disease) ([place])    Dermatitis    Dermatitis    Dysrhythmia    Elevated temperature 10/26/2017   temp of 99.9 , denies cold/flu sx; reports she gets frwquent UTIs, patient sister accompanying today  reports she is going to Dr Gloriann Loan (surgeon) office today  for pre-op appt  at 2pm   Essential tremor    Family history of adverse reaction to anesthesia    sister gets nausea and vomiting    Fever    History of ESBL E. coli infection    10-26-17 this RN did not enter this ESBL inf hx; patient and sister deny hx of ESBL infection and there is no supporting lab work for ESBL infection in epic    Hyperlipidemia    Hypothyroidism    Thrombocytopenia ([place])    Unspecified hydronephrosis    Urinary incontinence     Past Surgical History:  Procedure Laterality Date   BIOPSY  10/12/2019   Procedure: BIOPSY;  Surgeon: Daneil Dolin, MD;  Location: [facility];  Service: Endoscopy;;   CARDIAC CATHETERIZATION  07/15/2016   at [facility]   COLONOSCOPY  12/2013   Dr. Aaron Edelman C. Lewis at [facility], long tortuous colon, nonbleeding external hemorrhoids, questionable small subcentimeter benign/hyperplastic polyp in the ascending colon, not removed.  Advised to have repeat colonoscopy in 4 years.   COLONOSCOPY WITH PROPOFOL N/A 10/12/2019   [place]: 1.5 cm verrucous, raised nodule upper end of her gluteal folds.  Scattered diverticula.  3 sessile polyps removed, tubular adenomas.  Next colonoscopy in 5 years.   CYSTOSCOPY W/ URETERAL STENT PLACEMENT Left 09/07/2017   Procedure: CYSTOSCOPY WITH RETROGRADE PYELOGRAM/URETERAL STENT PLACEMENT;  Surgeon: Lucas Mallow, MD;  Location: [facility];  Service: Urology;  Laterality: Left;   ESOPHAGOGASTRODUODENOSCOPY (EGD) WITH PROPOFOL N/A 10/12/2019   [place]: Portal hypertensive gastropathy.  Linear gastric erosions.  No varices.  Biopsy showed reactive gastropathy.  No H. pylori.  Repeat EGD in 2 years to screen for varices.    EYE SURGERY  5 years ago    bilateral cataract extraction    eyelid lift Bilateral    LAPAROSCOPIC NEPHRECTOMY, HAND ASSISTED Left 11/01/2017   Procedure: LEFT HAND ASSISTED LAPAROSCOPIC NEPHRECTOMY;  Surgeon: Lucas Mallow, MD;  Location: [facility];  Service: Urology;  Laterality: Left;   MASTECTOMY MODIFIED RADICAL Left 01/23/2019   Procedure: LEFT MODIFIED RADICAL MASTECTOMY;  Surgeon: Aviva Signs, MD;  Location: [facility];  Service: General;  Laterality: Left;   POLYPECTOMY  10/12/2019   Procedure: POLYPECTOMY;  Surgeon: Daneil Dolin, MD;  Location: [facility];  Service: Endoscopy;;    Family History  Problem Relation Age of Onset   Colon cancer Mother        died at age 54   Colon cancer Father        died in his 31s   Tremor Father    Melanoma Brother    Colon cancer Paternal Grandmother        age 60   Colon cancer Paternal Grandfather        in his 93s   Liver cancer Maternal Grandfather        died age 31    Social History:  reports that she has been smoking cigarettes. She has a 22.50 pack-year smoking history. She has never used smokeless tobacco. She reports that she does not currently use alcohol. She reports that she does not currently use drugs.  Allergies:  Allergies  Allergen Reactions   Codeine Nausea And Vomiting    In cough syrup cause nausea and vomiting   Penicillins Hives, Rash and Other (See Comments)    Did it involve swelling of the face/tongue/throat, SOB, or low BP? Unknown Did it involve sudden or severe rash/hives, skin peeling, or any reaction on the inside of your mouth or nose? Unknown Did you need to seek medical attention at a hospital or doctor's office? Unknown When did it last happen?    as a child If all above answers are NO, may proceed with cephalosporin use.     Medications: I have reviewed the patient's current medications.  Results for orders placed or performed during the hospital encounter of 05/07/21 (from the past 48  hour(s))  CBC with Differential     Status: Abnormal   Collection Time: 05/07/21  2:03 PM  Result Value Ref Range   WBC 5.0 4.0 - 10.5 K/uL   RBC 4.45 3.87 - 5.11 MIL/uL   Hemoglobin 14.2 12.0 - 15.0 g/dL   HCT 42.4 36.0 - 46.0 %   MCV 95.3 80.0 - 100.0 fL   MCH 31.9 26.0 - 34.0 pg   MCHC 33.5 30.0 - 36.0 g/dL   RDW 12.7 11.5 - 15.5 %   Platelets 130 (L) 150 - 400 K/uL    Comment: SPECIMEN CHECKED FOR CLOTS REPEATED TO VERIFY    nRBC 0.0 0.0 - 0.2 %   Neutrophils Relative % 37 %   Neutro Abs  1.8 1.7 - 7.7 K/uL   Lymphocytes Relative 49 %   Lymphs Abs 2.5 0.7 - 4.0 K/uL   Monocytes Relative 10 %   Monocytes Absolute 0.5 0.1 - 1.0 K/uL   Eosinophils Relative 3 %   Eosinophils Absolute 0.1 0.0 - 0.5 K/uL   Basophils Relative 1 %   Basophils Absolute 0.0 0.0 - 0.1 K/uL   Immature Granulocytes 0 %   Abs Immature Granulocytes 0.01 0.00 - 0.07 K/uL    Comment: Performed at [facility], [address] [place], [place] [address]  Basic metabolic panel     Status: Abnormal   Collection Time: 05/07/21  2:03 PM  Result Value Ref Range   Sodium 140 135 - 145 mmol/L   Potassium 4.8 3.5 - 5.1 mmol/L   Chloride 104 98 - 111 mmol/L   CO2 26 22 - 32 mmol/L   Glucose, Bld 103 (H) 70 - 99 mg/dL    Comment: Glucose reference range applies only to samples taken after fasting for at least 8 hours.   BUN 17 8 - 23 mg/dL   Creatinine, Ser 0.72 0.44 - 1.00 mg/dL   Calcium 8.6 (L) 8.9 - 10.3 mg/dL   GFR, Estimated >60 >60 mL/min    Comment: (NOTE) Calculated using the CKD-EPI Creatinine Equation (2021)    Anion gap 10 5 - 15    Comment: Performed at [facility], [address] [place], [place] [address]  Resp Panel by RT-PCR (Flu A&B, Covid) Nasopharyngeal Swab     Status: Abnormal   Collection Time: 05/07/21  5:04 PM   Specimen: Nasopharyngeal Swab; Nasopharyngeal(NP) swabs in vial transport medium  Result Value Ref Range   SARS Coronavirus 2 by RT PCR POSITIVE (A) NEGATIVE    Comment:  (NOTE) SARS-CoV-2 target nucleic acids are DETECTED.  The SARS-CoV-2 RNA is generally detectable in upper respiratory specimens during the acute phase of infection. Positive results are indicative of the presence of the identified virus, but do not rule out bacterial infection or co-infection with other pathogens not detected by the test. Clinical correlation with patient history and other diagnostic information is necessary to determine patient infection status. The expected result is Negative.  Fact Sheet for Patients: EntrepreneurPulse.com.au  Fact Sheet for Healthcare Providers: IncredibleEmployment.be  This test is not yet approved or cleared by the [place] FDA and  has been authorized for detection and/or diagnosis of SARS-CoV-2 by FDA under an Emergency Use Authorization (EUA).  This EUA will remain in effect (meaning this test can be used) for the duration of  the COVID-19 declaration under Section 564(b)(1) of the A ct, 21 U.S.C. section 360bbb-3(b)(1), unless the authorization is terminated or revoked sooner.     Influenza A by PCR NEGATIVE NEGATIVE   Influenza B by PCR NEGATIVE NEGATIVE    Comment: (NOTE) The Xpert Xpress SARS-CoV-2/FLU/RSV plus assay is intended as an aid in the diagnosis of influenza from Nasopharyngeal swab specimens and should not be used as a sole basis for treatment. Nasal washings and aspirates are unacceptable for Xpert Xpress SARS-CoV-2/FLU/RSV testing.  Fact Sheet for Patients: EntrepreneurPulse.com.au  Fact Sheet for Healthcare Providers: IncredibleEmployment.be  This test is not yet approved or cleared by the [place] FDA and has been authorized for detection and/or diagnosis of SARS-CoV-2 by FDA under an Emergency Use Authorization (EUA). This EUA will remain in effect (meaning this test can be used) for the duration of the COVID-19 declaration under  Section 564(b)(1) of the Act,  21 U.S.C. section 360bbb-3(b)(1), unless the authorization is terminated or revoked.  Performed at [facility], [address], [place], [place] [address]   Surgical pcr screen     Status: Abnormal   Collection Time: 05/07/21 11:45 PM   Specimen: Nasal Mucosa; Nasal Swab  Result Value Ref Range   MRSA, PCR POSITIVE (A) NEGATIVE    Comment: RESULT CALLED TO, READ BACK BY AND VERIFIED WITH: J ATANBILA,RN@[id number]  05/08/21 [place]    Staphylococcus aureus POSITIVE (A) NEGATIVE    Comment: (NOTE) The Xpert SA Assay (FDA approved for NASAL specimens in patients 26 years of age and older), is one component of a comprehensive surveillance program. It is not intended to diagnose infection nor to guide or monitor treatment. Performed at [facility], [place] [address], [place], [place] [address]   Basic metabolic panel     Status: Abnormal   Collection Time: 05/08/21  3:01 AM  Result Value Ref Range   Sodium 138 135 - 145 mmol/L   Potassium 4.3 3.5 - 5.1 mmol/L   Chloride 106 98 - 111 mmol/L   CO2 25 22 - 32 mmol/L   Glucose, Bld 172 (H) 70 - 99 mg/dL    Comment: Glucose reference range applies only to samples taken after fasting for at least 8 hours.   BUN 15 8 - 23 mg/dL   Creatinine, Ser 0.61 0.44 - 1.00 mg/dL   Calcium 8.3 (L) 8.9 - 10.3 mg/dL   GFR, Estimated >60 >60 mL/min    Comment: (NOTE) Calculated using the CKD-EPI Creatinine Equation (2021)    Anion gap 7 5 - 15    Comment: Performed at [facility] [address] [place], [place] [address]  CBC     Status: Abnormal   Collection Time: 05/08/21  3:01 AM  Result Value Ref Range   WBC 5.6 4.0 - 10.5 K/uL   RBC 4.06 3.87 - 5.11 MIL/uL   Hemoglobin 12.4 12.0 - 15.0 g/dL   HCT 37.6 36.0 - 46.0 %   MCV 92.6 80.0 - 100.0 fL   MCH 30.5 26.0 - 34.0 pg   MCHC 33.0 30.0 - 36.0 g/dL   RDW 12.7 11.5 - 15.5 %   Platelets 116 (L) 150 - 400 K/uL    Comment: Immature Platelet Fraction may  be clinically indicated, consider ordering this additional test [id number] REPEATED TO VERIFY PLATELET COUNT CONFIRMED BY SMEAR    nRBC 0.0 0.0 - 0.2 %    Comment: Performed at [facility], [place] [address] [place],  [address]    DG Ankle Complete Right  Result Date: 05/07/2021 CLINICAL DATA:  Right ankle pain and swelling after fall. EXAM: RIGHT ANKLE - COMPLETE 3+ VIEW COMPARISON:  None. FINDINGS: Acute fracture of the medial malleolus with mild medial and inferior displacement. Acute comminuted fracture of the distal fibular metaphysis with mild lateral displacement and overriding. No obvious posterior malleolar fracture. Asymmetric widening of the ankle mortise. No dislocation. Joint spaces are preserved. Diffuse soft tissue swelling, more prominent laterally. IMPRESSION: 1. Acute bimalleolar fractures with ankle instability. Electronically Signed   By: Titus Dubin M.D.   On: 05/07/2021 15:22   CT Head Wo Contrast  Result Date: 05/07/2021 CLINICAL DATA:  Head trauma.  Status post fall.  Hit back of head EXAM: CT HEAD WITHOUT CONTRAST TECHNIQUE: Contiguous axial images were obtained from the base of the skull through the vertex without intravenous contrast. COMPARISON:  MRI brain 08/06/2017. FINDINGS:  Brain: No evidence of acute infarction, hemorrhage, hydrocephalus, extra-axial collection or mass lesion/mass effect. There is mild diffuse low-attenuation within the subcortical and periventricular white matter compatible with chronic microvascular disease. Prominence of the sulci and ventricles compatible with brain atrophy. Vascular: No hyperdense vessel or unexpected calcification. Skull: Normal. Negative for fracture or focal lesion. Sinuses/Orbits: No acute finding. Other: None. IMPRESSION: 1. No acute intracranial abnormalities. 2. Mild chronic small vessel ischemic change and brain atrophy. Electronically Signed   By: Kerby Moors M.D.   On: 05/07/2021 15:46    Review of  Systems  HENT:  Negative for ear discharge, ear pain, hearing loss and tinnitus.   Eyes:  Negative for photophobia and pain.  Respiratory:  Negative for cough and shortness of breath.   Cardiovascular:  Negative for chest pain.  Gastrointestinal:  Negative for abdominal pain, nausea and vomiting.  Genitourinary:  Negative for dysuria, flank pain, frequency and urgency.  Musculoskeletal:  Positive for arthralgias (Right ankle). Negative for back pain, myalgias and neck pain.  Neurological:  Negative for dizziness and headaches.  Hematological:  Does not bruise/bleed easily.  Psychiatric/Behavioral:  The patient is not nervous/anxious.   Blood pressure 118/75, pulse 82, temperature 98.4 F (36.9 C), temperature source Oral, resp. rate 18, height 5\' 4"  (1.626 m), weight 74.8 kg, SpO2 92 %. Physical Exam Constitutional:      General: She is not in acute distress.    Appearance: She is well-developed. She is not diaphoretic.  HENT:     Head: Normocephalic and atraumatic.  Eyes:     General: No scleral icterus.       Right eye: No discharge.        Left eye: No discharge.     Conjunctiva/sclera: Conjunctivae normal.  Cardiovascular:     Rate and Rhythm: Normal rate and regular rhythm.  Pulmonary:     Effort: Pulmonary effort is normal. No respiratory distress.  Musculoskeletal:     Cervical back: Normal range of motion.     Comments: RLE No traumatic wounds, ecchymosis, or rash  Short leg splint in place  No knee effusion  Knee stable to varus/ valgus and anterior/posterior stress  Sens DPN, SPN, TN intact  Motor EHL 5/5  Toes perfused, No significant edema  Skin:    General: Skin is warm and dry.  Neurological:     Mental Status: She is alert.  Psychiatric:        Mood and Affect: Mood normal.        Behavior: Behavior normal.    Assessment/Plan: Right ankle fx -- Plan ORIF tomorrow with Dr. Doreatha Martin as long as swelling permits. Work on elevation, icing  today. Covid+ Multiple medical problems including bipolar disorder, tremors, CHF, COPD, and breast cancer -- per primary service    Lisette Abu, PA-C Orthopedic Surgery [phone] 05/08/2021, 9:36 AM

## 2021-05-08 NOTE — H&P (View-Only) (Signed)
Patient seen and evaluated by myself this evening.  Unfortunately given or availability Dr. Doreatha Martin was unable to perform her surgery today and I was asked to take over care.  I agree with note by Hilbert Odor.  Patient with a closed right bimalleolar ankle fracture currently immobilized in a splint.  Given unstable fracture pattern operative fixation is recommended.The risks benefits and alternatives were discussed with the patient including but not limited to the risks of nonoperative treatment, versus surgical intervention including infection, bleeding, nerve injury, malunion, nonunion, the need for revision surgery, hardware prominence, hardware failure, the need for hardware removal, blood clots, cardiopulmonary complications, morbidity, mortality, among others, and they were willing to proceed.   Reason for Consult:Right ankle fx Referring Physician: Wendee Beavers Time called: 0730 Time at bedside: Laurie Moreno is an 71 y.o. female.  HPI: Laurie Moreno was walking into a doctor's office and slipped on a wet entryway. As she fell her feet got twisted and she had immediate right ankle pain and could not get up or bear weight. [facility] was taken to [facility] where x-rays showed an ankle fx and orthopedic surgery was consulted. She was transferred to [facility] for definitive care. She lives with family and does not use any assistive devices to ambulate.  Past Medical History:  Diagnosis Date   Acute pyelonephritis    Anxiety    Bipolar disorder, unspecified ([place])    From [place] form   CHF (congestive heart failure) ([place])    see LOV 06-25-16 epic care everywhere ; "sister reports that patient has a 'weak heart'  but they did testing and she hasnt had to go back since"   Chronic respiratory failure with hypoxia ([place])    Complication of anesthesia    reports with last surgery on 09-07-17 it took 3 hours for her to wake up and she was on the ventilator post operatively for an additional  8 hours    COPD (chronic  obstructive pulmonary disease) ([place])    Dermatitis    Dermatitis    Dysrhythmia    Elevated temperature 10/26/2017   temp of 99.9 , denies cold/flu sx; reports she gets frwquent UTIs, patient sister accompanying today  reports she is going to Dr Gloriann Loan (surgeon) office today  for pre-op appt  at 2pm   Essential tremor    Family history of adverse reaction to anesthesia    sister gets nausea and vomiting    Fever    History of ESBL E. coli infection    10-26-17 this RN did not enter this ESBL inf hx; patient and sister deny hx of ESBL infection and there is no supporting lab work for ESBL infection in epic    Hyperlipidemia    Hypothyroidism    Thrombocytopenia ([place])    Unspecified hydronephrosis    Urinary incontinence     Past Surgical History:  Procedure Laterality Date   BIOPSY  10/12/2019   Procedure: BIOPSY;  Surgeon: Daneil Dolin, MD;  Location: [facility];  Service: Endoscopy;;   CARDIAC CATHETERIZATION  07/15/2016   at [facility]   COLONOSCOPY  12/2013   Dr. Aaron Edelman C. [facility] at [facility], long tortuous colon, nonbleeding external hemorrhoids, questionable small subcentimeter benign/hyperplastic polyp in the ascending colon, not removed.  Advised to have repeat colonoscopy in 4 years.   COLONOSCOPY WITH PROPOFOL N/A 10/12/2019   [place]: 1.5 cm verrucous, raised nodule upper end of her gluteal folds.  Scattered diverticula.  3 sessile polyps removed, tubular adenomas.  Next colonoscopy in 5 years.   CYSTOSCOPY W/ URETERAL STENT PLACEMENT Left 09/07/2017   Procedure: CYSTOSCOPY WITH RETROGRADE PYELOGRAM/URETERAL STENT PLACEMENT;  Surgeon: Lucas Mallow, MD;  Location: [facility] OR[facility];  Service: Urology;  Laterality: Left;   ESOPHAGOGASTRODUODENOSCOPY (EGD) WITH PROPOFOL N/A 10/12/2019   [place]: Portal hypertensive gastropathy.  Linear gastric erosions.  No varices.  Biopsy showed reactive gastropathy.  No H. pylori.  Repeat EGD in 2 years to screen for varices.    EYE SURGERY  5 years ago    bilateral cataract extraction    eyelid lift Bilateral    LAPAROSCOPIC NEPHRECTOMY, HAND ASSISTED Left 11/01/2017   Procedure: LEFT HAND ASSISTED LAPAROSCOPIC NEPHRECTOMY;  Surgeon: Lucas Mallow, MD;  Location: [facility];  Service: Urology;  Laterality: Left;   MASTECTOMY MODIFIED RADICAL Left 01/23/2019   Procedure: LEFT MODIFIED RADICAL MASTECTOMY;  Surgeon: Aviva Signs, MD;  Location: [facility];  Service: General;  Laterality: Left;   POLYPECTOMY  10/12/2019   Procedure: POLYPECTOMY;  Surgeon: Daneil Dolin, MD;  Location: [facility];  Service: Endoscopy;;    Family History  Problem Relation Age of Onset   Colon cancer Mother        died at age 65   Colon cancer Father        died in his 71s   Tremor Father    Melanoma Brother    Colon cancer Paternal Grandmother        age 39   Colon cancer Paternal Grandfather        in his 52s   Liver cancer Maternal Grandfather        died age 69    Social History:  reports that she has been smoking cigarettes. She has a 22.50 pack-year smoking history. She has never used smokeless tobacco. She reports that she does not currently use alcohol. She reports that she does not currently use drugs.  Allergies:  Allergies  Allergen Reactions   Codeine Nausea And Vomiting    In cough syrup cause nausea and vomiting   Penicillins Hives, Rash and Other (See Comments)    Did it involve swelling of the face/tongue/throat, SOB, or low BP? Unknown Did it involve sudden or severe rash/hives, skin peeling, or any reaction on the inside of your mouth or nose? Unknown Did you need to seek medical attention at a hospital or doctor's office? Unknown When did it last happen?    as a child If all above answers are NO, may proceed with cephalosporin use.     Medications: I have reviewed the patient's current medications.  Results for orders placed or performed during the hospital encounter of 05/07/21 (from the past 48  hour(s))  CBC with Differential     Status: Abnormal   Collection Time: 05/07/21  2:03 PM  Result Value Ref Range   WBC 5.0 4.0 - 10.5 K/uL   RBC 4.45 3.87 - 5.11 MIL/uL   Hemoglobin 14.2 12.0 - 15.0 g/dL   HCT 42.4 36.0 - 46.0 %   MCV 95.3 80.0 - 100.0 fL   MCH 31.9 26.0 - 34.0 pg   MCHC 33.5 30.0 - 36.0 g/dL   RDW 12.7 11.5 - 15.5 %   Platelets 130 (L) 150 - 400 K/uL    Comment: SPECIMEN CHECKED FOR CLOTS REPEATED TO VERIFY    nRBC 0.0 0.0 - 0.2 %   Neutrophils Relative % 37 %   Neutro Abs  1.8 1.7 - 7.7 K/uL   Lymphocytes Relative 49 %   Lymphs Abs 2.5 0.7 - 4.0 K/uL   Monocytes Relative 10 %   Monocytes Absolute 0.5 0.1 - 1.0 K/uL   Eosinophils Relative 3 %   Eosinophils Absolute 0.1 0.0 - 0.5 K/uL   Basophils Relative 1 %   Basophils Absolute 0.0 0.0 - 0.1 K/uL   Immature Granulocytes 0 %   Abs Immature Granulocytes 0.01 0.00 - 0.07 K/uL    Comment: Performed at [facility], [address] [place], [place] [address]  Basic metabolic panel     Status: Abnormal   Collection Time: 05/07/21  2:03 PM  Result Value Ref Range   Sodium 140 135 - 145 mmol/L   Potassium 4.8 3.5 - 5.1 mmol/L   Chloride 104 98 - 111 mmol/L   CO2 26 22 - 32 mmol/L   Glucose, Bld 103 (H) 70 - 99 mg/dL    Comment: Glucose reference range applies only to samples taken after fasting for at least 8 hours.   BUN 17 8 - 23 mg/dL   Creatinine, Ser 0.72 0.44 - 1.00 mg/dL   Calcium 8.6 (L) 8.9 - 10.3 mg/dL   GFR, Estimated >60 >60 mL/min    Comment: (NOTE) Calculated using the CKD-EPI Creatinine Equation (2021)    Anion gap 10 5 - 15    Comment: Performed at [facility], [address] [place],  [address]  Resp Panel by RT-PCR (Flu A&B, Covid) Nasopharyngeal Swab     Status: Abnormal   Collection Time: 05/07/21  5:04 PM   Specimen: Nasopharyngeal Swab; Nasopharyngeal(NP) swabs in vial transport medium  Result Value Ref Range   SARS Coronavirus 2 by RT PCR POSITIVE (A) NEGATIVE    Comment:  (NOTE) SARS-CoV-2 target nucleic acids are DETECTED.  The SARS-CoV-2 RNA is generally detectable in upper respiratory specimens during the acute phase of infection. Positive results are indicative of the presence of the identified virus, but do not rule out bacterial infection or co-infection with other pathogens not detected by the test. Clinical correlation with patient history and other diagnostic information is necessary to determine patient infection status. The expected result is Negative.  Fact Sheet for Patients: EntrepreneurPulse.com.au  Fact Sheet for Healthcare Providers: IncredibleEmployment.be  This test is not yet approved or cleared by the [place] FDA and  has been authorized for detection and/or diagnosis of SARS-CoV-2 by FDA under an Emergency Use Authorization (EUA).  This EUA will remain in effect (meaning this test can be used) for the duration of  the COVID-19 declaration under Section 564(b)(1) of the A ct, 21 U.S.C. section 360bbb-3(b)(1), unless the authorization is terminated or revoked sooner.     Influenza A by PCR NEGATIVE NEGATIVE   Influenza B by PCR NEGATIVE NEGATIVE    Comment: (NOTE) The Xpert Xpress SARS-CoV-2/FLU/RSV plus assay is intended as an aid in the diagnosis of influenza from Nasopharyngeal swab specimens and should not be used as a sole basis for treatment. Nasal washings and aspirates are unacceptable for Xpert Xpress SARS-CoV-2/FLU/RSV testing.  Fact Sheet for Patients: EntrepreneurPulse.com.au  Fact Sheet for Healthcare Providers: IncredibleEmployment.be  This test is not yet approved or cleared by the [place] FDA and has been authorized for detection and/or diagnosis of SARS-CoV-2 by FDA under an Emergency Use Authorization (EUA). This EUA will remain in effect (meaning this test can be used) for the duration of the COVID-19 declaration under  Section 564(b)(1) of the Act,  21 U.S.C. section 360bbb-3(b)(1), unless the authorization is terminated or revoked.  Performed at [facility], [address], [place], [place] [address]   Surgical pcr screen     Status: Abnormal   Collection Time: 05/07/21 11:45 PM   Specimen: Nasal Mucosa; Nasal Swab  Result Value Ref Range   MRSA, PCR POSITIVE (A) NEGATIVE    Comment: RESULT CALLED TO, READ BACK BY AND VERIFIED WITH: J ATANBILA,RN@[id number]  05/08/21 [place]    Staphylococcus aureus POSITIVE (A) NEGATIVE    Comment: (NOTE) The Xpert SA Assay (FDA approved for NASAL specimens in patients 18 years of age and older), is one component of a comprehensive surveillance program. It is not intended to diagnose infection nor to guide or monitor treatment. Performed at [facility], [place] [address] [place], [place] [address]   Basic metabolic panel     Status: Abnormal   Collection Time: 05/08/21  3:01 AM  Result Value Ref Range   Sodium 138 135 - 145 mmol/L   Potassium 4.3 3.5 - 5.1 mmol/L   Chloride 106 98 - 111 mmol/L   CO2 25 22 - 32 mmol/L   Glucose, Bld 172 (H) 70 - 99 mg/dL    Comment: Glucose reference range applies only to samples taken after fasting for at least 8 hours.   BUN 15 8 - 23 mg/dL   Creatinine, Ser 0.61 0.44 - 1.00 mg/dL   Calcium 8.3 (L) 8.9 - 10.3 mg/dL   GFR, Estimated >60 >60 mL/min    Comment: (NOTE) Calculated using the CKD-EPI Creatinine Equation (2021)    Anion gap 7 5 - 15    Comment: Performed at [facility] [address], [place], [place] [address]  CBC     Status: Abnormal   Collection Time: 05/08/21  3:01 AM  Result Value Ref Range   WBC 5.6 4.0 - 10.5 K/uL   RBC 4.06 3.87 - 5.11 MIL/uL   Hemoglobin 12.4 12.0 - 15.0 g/dL   HCT 37.6 36.0 - 46.0 %   MCV 92.6 80.0 - 100.0 fL   MCH 30.5 26.0 - 34.0 pg   MCHC 33.0 30.0 - 36.0 g/dL   RDW 12.7 11.5 - 15.5 %   Platelets 116 (L) 150 - 400 K/uL    Comment: Immature Platelet Fraction may  be clinically indicated, consider ordering this additional test [id number] REPEATED TO VERIFY PLATELET COUNT CONFIRMED BY SMEAR    nRBC 0.0 0.0 - 0.2 %    Comment: Performed at [facility], [place] [address], [place], [place] [address]    DG Ankle Complete Right  Result Date: 05/07/2021 CLINICAL DATA:  Right ankle pain and swelling after fall. EXAM: RIGHT ANKLE - COMPLETE 3+ VIEW COMPARISON:  None. FINDINGS: Acute fracture of the medial malleolus with mild medial and inferior displacement. Acute comminuted fracture of the distal fibular metaphysis with mild lateral displacement and overriding. No obvious posterior malleolar fracture. Asymmetric widening of the ankle mortise. No dislocation. Joint spaces are preserved. Diffuse soft tissue swelling, more prominent laterally. IMPRESSION: 1. Acute bimalleolar fractures with ankle instability. Electronically Signed   By: Titus Dubin M.D.   On: 05/07/2021 15:22   CT Head Wo Contrast  Result Date: 05/07/2021 CLINICAL DATA:  Head trauma.  Status post fall.  Hit back of head EXAM: CT HEAD WITHOUT CONTRAST TECHNIQUE: Contiguous axial images were obtained from the base of the skull through the vertex without intravenous contrast. COMPARISON:  MRI brain 08/06/2017. FINDINGS:  Brain: No evidence of acute infarction, hemorrhage, hydrocephalus, extra-axial collection or mass lesion/mass effect. There is mild diffuse low-attenuation within the subcortical and periventricular white matter compatible with chronic microvascular disease. Prominence of the sulci and ventricles compatible with brain atrophy. Vascular: No hyperdense vessel or unexpected calcification. Skull: Normal. Negative for fracture or focal lesion. Sinuses/Orbits: No acute finding. Other: None. IMPRESSION: 1. No acute intracranial abnormalities. 2. Mild chronic small vessel ischemic change and brain atrophy. Electronically Signed   By: Kerby Moors M.D.   On: 05/07/2021 15:46    Review of  Systems  HENT:  Negative for ear discharge, ear pain, hearing loss and tinnitus.   Eyes:  Negative for photophobia and pain.  Respiratory:  Negative for cough and shortness of breath.   Cardiovascular:  Negative for chest pain.  Gastrointestinal:  Negative for abdominal pain, nausea and vomiting.  Genitourinary:  Negative for dysuria, flank pain, frequency and urgency.  Musculoskeletal:  Positive for arthralgias (Right ankle). Negative for back pain, myalgias and neck pain.  Neurological:  Negative for dizziness and headaches.  Hematological:  Does not bruise/bleed easily.  Psychiatric/Behavioral:  The patient is not nervous/anxious.   Blood pressure 118/75, pulse 82, temperature 98.4 F (36.9 C), temperature source Oral, resp. rate 18, height 5\' 4"  (1.626 m), weight 74.8 kg, SpO2 92 %. Physical Exam Constitutional:      General: She is not in acute distress.    Appearance: She is well-developed. She is not diaphoretic.  HENT:     Head: Normocephalic and atraumatic.  Eyes:     General: No scleral icterus.       Right eye: No discharge.        Left eye: No discharge.     Conjunctiva/sclera: Conjunctivae normal.  Cardiovascular:     Rate and Rhythm: Normal rate and regular rhythm.  Pulmonary:     Effort: Pulmonary effort is normal. No respiratory distress.  Musculoskeletal:     Cervical back: Normal range of motion.     Comments: RLE No traumatic wounds, ecchymosis, or rash  Short leg splint in place  No knee effusion  Knee stable to varus/ valgus and anterior/posterior stress  Sens DPN, SPN, TN intact  Motor EHL 5/5  Toes perfused, No significant edema  Skin:    General: Skin is warm and dry.  Neurological:     Mental Status: She is alert.  Psychiatric:        Mood and Affect: Mood normal.        Behavior: Behavior normal.    Assessment/Plan: Right ankle fx -- Plan ORIF tomorrow with Dr. Doreatha Martin as long as swelling permits. Work on elevation, icing  today. Covid+ Multiple medical problems including bipolar disorder, tremors, CHF, COPD, and breast cancer -- per primary service    Lisette Abu, PA-C Orthopedic Surgery [phone] 05/08/2021, 9:36 AM

## 2021-05-08 NOTE — Plan of Care (Signed)

## 2021-05-09 DIAGNOSIS — F17200 Nicotine dependence, unspecified, uncomplicated: Secondary | ICD-10-CM

## 2021-05-09 LAB — CBC
HCT: 38.6 % (ref 36.0–46.0)
Hemoglobin: 12.6 g/dL (ref 12.0–15.0)
MCH: 30.1 pg (ref 26.0–34.0)
MCHC: 32.6 g/dL (ref 30.0–36.0)
MCV: 92.3 fL (ref 80.0–100.0)
Platelets: 115 10*3/uL — ABNORMAL LOW (ref 150–400)
RBC: 4.18 MIL/uL (ref 3.87–5.11)
RDW: 12.5 % (ref 11.5–15.5)
WBC: 6.4 10*3/uL (ref 4.0–10.5)
nRBC: 0 % (ref 0.0–0.2)

## 2021-05-09 LAB — RENAL FUNCTION PANEL
Albumin: 3.4 g/dL — ABNORMAL LOW (ref 3.5–5.0)
Anion gap: 9 (ref 5–15)
BUN: 17 mg/dL (ref 8–23)
CO2: 27 mmol/L (ref 22–32)
Calcium: 7.8 mg/dL — ABNORMAL LOW (ref 8.9–10.3)
Chloride: 104 mmol/L (ref 98–111)
Creatinine, Ser: 0.67 mg/dL (ref 0.44–1.00)
GFR, Estimated: >60 mL/min (ref >60)
Glucose, Bld: 138 mg/dL — ABNORMAL HIGH (ref 70–99)
Phosphorus: 3.6 mg/dL (ref 2.5–4.6)
Potassium: 4.9 mmol/L (ref 3.5–5.1)
Sodium: 140 mmol/L (ref 135–145)

## 2021-05-09 LAB — MAGNESIUM: Magnesium: 2.1 mg/dL (ref 1.7–2.4)

## 2021-05-09 MED ORDER — VITAMIN D 25 MCG (1000 UNIT) PO TABS
2000.0000 [IU] | ORAL_TABLET | Freq: Every day | ORAL | Status: DC
  Administered 2021-05-09 – 2021-05-13 (×4): 2000 [IU] via ORAL
  Filled 2021-05-09 (×4): qty 2

## 2021-05-09 MED ORDER — DEXTROSE IN LACTATED RINGERS 5 % IV SOLN: INTRAVENOUS | Status: DC

## 2021-05-09 MED ORDER — NICOTINE 14 MG/24HR TD PT24
14.0000 mg | MEDICATED_PATCH | Freq: Every day | TRANSDERMAL | Status: DC
  Administered 2021-05-09 – 2021-05-13 (×5): 14 mg via TRANSDERMAL
  Filled 2021-05-09 (×5): qty 1

## 2021-05-09 MED ORDER — DEXTROSE IN LACTATED RINGERS 5 % IV SOLN: INTRAVENOUS | Status: AC

## 2021-05-09 MED ORDER — OYSTER SHELL CALCIUM/D3 500-5 MG-MCG PO TABS
1.0000 | ORAL_TABLET | Freq: Two times a day (BID) | ORAL | Status: DC
  Administered 2021-05-09 – 2021-05-13 (×8): 1 via ORAL
  Filled 2021-05-09 (×8): qty 1

## 2021-05-09 MED ORDER — HYDROXYZINE HCL 25 MG PO TABS
25.0000 mg | ORAL_TABLET | Freq: Three times a day (TID) | ORAL | Status: DC | PRN
  Administered 2021-05-09 – 2021-05-11 (×2): 25 mg via ORAL
  Filled 2021-05-09 (×2): qty 1

## 2021-05-09 NOTE — Progress Notes (Signed)
PROGRESS NOTE  Laurie Moreno EUM:[id number] DOB: Aug 25, 1950   PCP: Derek Jack, MD  Patient is from: Home.  Lives with sister.  Independently ambulates at baseline.  Does not drive.  DOA: 05/07/2021 LOS: 2  Chief complaints:  Chief Complaint  Patient presents with   Fall   Ankle Pain     Brief Narrative / Interim history: 71 year old F with PMH of diastolic CHF, bipolar disorder, hand tremor, COPD, tobacco use disorder and left breast cancer status postmastectomy presenting to [facility], ED with right ankle pain and deformity after she had an accidental fall on the way to her psychiatrist office. She slipped due to rain, and fell backward, and struck the back of the head and twisted her right ankle, she had immediate ankle pain, unable to bear weight, she denies any loss of consciousness.  X-ray showed bimalleolar right ankle fracture.  She also tested positive for COVID-19.  She is not symptomatic from COVID infection.  She is vaccinated and boosted.  Transferred to [facility] due to lack of [place] room at [facility].  Orthopedic surgery following.  Plan for ORIF either on 1/6   Subjective: Seen and examined earlier this morning.  No major events overnight of this morning.  Pain fairly controlled.  Denies chest pain, dyspnea, cough, GI or UTI symptoms.  Objective: Vitals:   05/08/21 1700 05/08/21 1800 05/08/21 2024 05/09/21 0915  BP: 129/72  111/70 113/77  Pulse: 94  85 85  Resp: 18  19 18   Temp: (!) 100.4 F (38 C) 99.9 F (37.7 C) 98.8 F (37.1 C) 98.7 F (37.1 C)  TempSrc: Oral Oral Oral Oral  SpO2: 92%  91% 96%  Weight:      Height:        Examination:  GENERAL: No apparent distress.  Nontoxic. HEENT: MMM.  Vision and hearing grossly intact.  NECK: Supple.  No apparent JVD.  RESP: 96% on RA.  No IWOB.  Fair aeration bilaterally. CVS:  RRR. Heart sounds normal.  ABD/GI/GU: BS+. Abd soft, NTND.  MSK/EXT:  Moves extremities.  Dressing over RLE/right foot.   Neurovascular intact. SKIN: no apparent skin lesion or wound NEURO: Awake and alert. Oriented appropriately.  No apparent focal neuro deficit. PSYCH: Calm. Normal affect.   Procedures:  None  Microbiology summarized: COVID-19 PCR positive. Influenza PCR negative.  Assessment & Plan: Accidental fall-slipped and fell Right ankle fracture/bimalleolar fracture -Orthopedic surgery following. -Plan for ORIF later today. -Pain control   COVID-19 infection-incidental finding.  Vaccinated x2 and booster x3.  As symptomatic. -Continue airborne and contact precaution for 10 days per protocol -No indication for treatment.   History of osteoporosis -Start reports she did receive Prolia last week   Hypertension: Normotensive. -Continue low-dose Coreg. -Continue holding lisinopril.   Chronic COPD: No respiratory symptoms. -Continue albuterol as needed.   History of breast cancer s/p left mastectomy -continue with anastrozole -Outpatient follow-up with oncologist.   hyperlipidemia -Continue with statin   Bipolar disorder: Stable. -Continue with depakote, Lexapro , gabapentin, and Klonopin   Hand tremors -On primidone   Hypothyroidism -Continue with Synthroid  Chronic thrombocytopenia: Platelets slightly lower than baseline but is stable. -Continue monitoring  Tobacco use disorder: Reports smoking about 10 to 14 cigarettes a day. -Counseled and encouraged cessation -Nicotine patch  Body mass index is 28.32 kg/m.         DVT prophylaxis:  heparin injection 5,000 Units Start: 05/08/21 0600 SCDs Start: 05/07/21 2238  Code Status: Full code Family  Communication: Patient and/or RN. Available if any question.  Level of care: Med-Surg Status is: Inpatient  Remains inpatient appropriate because: Need for surgical intervention for right ankle fracture   Final disposition: TBD after surgery.    Consultants:  Orthopedic surgery   Sch Meds:  Scheduled Meds:   anastrozole  1 mg Oral Daily   atorvastatin  40 mg Oral Daily   carbidopa-levodopa  1.5 tablet Oral TID   carvedilol  3.125 mg Oral BID WC   chlorhexidine  60 mL Topical Once   Chlorhexidine Gluconate Cloth  6 each Topical Q0600   clonazePAM  0.5 mg Oral QHS   divalproex  250 mg Oral TID   escitalopram  20 mg Oral Daily   gabapentin  300 mg Oral BID   heparin  5,000 Units Subcutaneous Q8H   levothyroxine  125 mcg Oral Q0600   molnupiravir EUA  4 capsule Oral BID   multivitamin with minerals  1 tablet Oral q morning   mupirocin ointment  1 application Nasal BID   nicotine  14 mg Transdermal Daily   oxybutynin  5 mg Oral Daily   pantoprazole  40 mg Oral Daily   povidone-iodine  2 application Topical Once   primidone  100 mg Oral BID   Continuous Infusions:   ceFAZolin (ANCEF) IV     dextrose 5% lactated ringers     vancomycin     PRN Meds:.acetaminophen **OR** acetaminophen, albuterol, morphine injection, oxyCODONE  Antimicrobials: Anti-infectives (From admission, onward)    Start     Dose/Rate Route Frequency Ordered Stop   05/09/21 0700  vancomycin (VANCOCIN) IVPB 1000 mg/200 mL premix        1,000 mg 200 mL/hr over 60 Minutes Intravenous On call 05/08/21 1315 05/10/21 0700   05/09/21 0600  ceFAZolin (ANCEF) IVPB 2g/100 mL premix        2 g 200 mL/hr over 30 Minutes Intravenous On call to O.R. 05/08/21 1314 05/10/21 0559   05/07/21 2345  molnupiravir EUA (LAGEVRIO) capsule 800 mg        4 capsule Oral 2 times daily 05/07/21 2124 05/12/21 2159   05/07/21 2200  nirmatrelvir/ritonavir EUA (PAXLOVID) 3 tablet  Status:  Discontinued        3 tablet Oral 2 times daily 05/07/21 2110 05/07/21 2119        I have personally reviewed the following labs and images: CBC: Recent Labs  Lab 05/07/21 1403 05/08/21 0301 05/09/21 0558  WBC 5.0 5.6 6.4  NEUTROABS 1.8  --   --   HGB 14.2 12.4 12.6  HCT 42.4 37.6 38.6  MCV 95.3 92.6 92.3  PLT 130* 116* 115*   BMP &GFR Recent  Labs  Lab 05/07/21 1403 05/08/21 0301 05/09/21 0558  NA 140 138 140  K 4.8 4.3 4.9  CL 104 106 104  CO2 26 25 27   GLUCOSE 103* 172* 138*  BUN 17 15 17   CREATININE 0.72 0.61 0.67  CALCIUM 8.6* 8.3* 7.8*  MG  --   --  2.1  PHOS  --   --  3.6   Estimated Creatinine Clearance: 64.8 mL/min (by C-G formula based on SCr of 0.67 mg/dL). Liver & Pancreas: Recent Labs  Lab 05/09/21 0558  ALBUMIN 3.4*   No results for input(s): LIPASE, AMYLASE in the last 168 hours. No results for input(s): AMMONIA in the last 168 hours. Diabetic: No results for input(s): HGBA1C in the last 72 hours. No results for input(s): GLUCAP in the  last 168 hours. Cardiac Enzymes: No results for input(s): CKTOTAL, CKMB, CKMBINDEX, TROPONINI in the last 168 hours. No results for input(s): PROBNP in the last 8760 hours. Coagulation Profile: No results for input(s): INR, PROTIME in the last 168 hours. Thyroid Function Tests: No results for input(s): TSH, T4TOTAL, FREET4, T3FREE, THYROIDAB in the last 72 hours. Lipid Profile: No results for input(s): CHOL, HDL, LDLCALC, TRIG, CHOLHDL, LDLDIRECT in the last 72 hours. Anemia Panel: No results for input(s): VITAMINB12, FOLATE, FERRITIN, TIBC, IRON, RETICCTPCT in the last 72 hours. Urine analysis:    Component Value Date/Time   COLORURINE YELLOW 03/24/2018 0530   APPEARANCEUR CLEAR 03/24/2018 0530   LABSPEC 1.013 03/24/2018 0530   PHURINE 6.0 03/24/2018 0530   GLUCOSEU NEGATIVE 03/24/2018 0530   HGBUR SMALL (A) 03/24/2018 0530   BILIRUBINUR small (A) 02/14/2021 1450   KETONESUR negative 02/14/2021 1450   KETONESUR NEGATIVE 03/24/2018 0530   PROTEINUR >=300 (A) 02/14/2021 1450   PROTEINUR NEGATIVE 03/24/2018 0530   UROBILINOGEN 0.2 02/14/2021 1450   NITRITE Positive (A) 02/14/2021 1450   NITRITE NEGATIVE 03/24/2018 0530   LEUKOCYTESUR Small (1+) (A) 02/14/2021 1450   Sepsis Labs: Invalid input(s): PROCALCITONIN, [place]  Microbiology: Recent  Results (from the past 240 hour(s))  Resp Panel by RT-PCR (Flu A&B, Covid) Nasopharyngeal Swab     Status: Abnormal   Collection Time: 05/07/21  5:04 PM   Specimen: Nasopharyngeal Swab; Nasopharyngeal(NP) swabs in vial transport medium  Result Value Ref Range Status   SARS Coronavirus 2 by RT PCR POSITIVE (A) NEGATIVE Final    Comment: (NOTE) SARS-CoV-2 target nucleic acids are DETECTED.  The SARS-CoV-2 RNA is generally detectable in upper respiratory specimens during the acute phase of infection. Positive results are indicative of the presence of the identified virus, but do not rule out bacterial infection or co-infection with other pathogens not detected by the test. Clinical correlation with patient history and other diagnostic information is necessary to determine patient infection status. The expected result is Negative.  Fact Sheet for Patients: EntrepreneurPulse.com.au  Fact Sheet for Healthcare Providers: IncredibleEmployment.be  This test is not yet approved or cleared by the [place] FDA and  has been authorized for detection and/or diagnosis of SARS-CoV-2 by FDA under an Emergency Use Authorization (EUA).  This EUA will remain in effect (meaning this test can be used) for the duration of  the COVID-19 declaration under Section 564(b)(1) of the A ct, 21 U.S.C. section 360bbb-3(b)(1), unless the authorization is terminated or revoked sooner.     Influenza A by PCR NEGATIVE NEGATIVE Final   Influenza B by PCR NEGATIVE NEGATIVE Final    Comment: (NOTE) The Xpert Xpress SARS-CoV-2/FLU/RSV plus assay is intended as an aid in the diagnosis of influenza from Nasopharyngeal swab specimens and should not be used as a sole basis for treatment. Nasal washings and aspirates are unacceptable for Xpert Xpress SARS-CoV-2/FLU/RSV testing.  Fact Sheet for Patients: EntrepreneurPulse.com.au  Fact Sheet for Healthcare  Providers: IncredibleEmployment.be  This test is not yet approved or cleared by the [place] FDA and has been authorized for detection and/or diagnosis of SARS-CoV-2 by FDA under an Emergency Use Authorization (EUA). This EUA will remain in effect (meaning this test can be used) for the duration of the COVID-19 declaration under Section 564(b)(1) of the Act, 21 U.S.C. section 360bbb-3(b)(1), unless the authorization is terminated or revoked.  Performed at [facility], [address] [place], [place] [address]   Surgical pcr screen  Status: Abnormal   Collection Time: 05/07/21 11:45 PM   Specimen: Nasal Mucosa; Nasal Swab  Result Value Ref Range Status   MRSA, PCR POSITIVE (A) NEGATIVE Final    Comment: RESULT CALLED TO, READ BACK BY AND VERIFIED WITH: J ATANBILA,RN@0440  05/08/21 [place]    Staphylococcus aureus POSITIVE (A) NEGATIVE Final    Comment: (NOTE) The Xpert SA Assay (FDA approved for NASAL specimens in patients 46 years of age and older), is one component of a comprehensive surveillance program. It is not intended to diagnose infection nor to guide or monitor treatment. Performed at [facility], [place] [address], [place], [place] [address]     Radiology Studies: No results found.     Lamaria Hildebrandt T. [place]  If 7PM-7AM, please contact night-coverage [email or web address] 05/09/2021, 11:50 AM

## 2021-05-10 ENCOUNTER — Inpatient Hospital Stay (HOSPITAL_COMMUNITY): Payer: Medicare Other

## 2021-05-10 ENCOUNTER — Inpatient Hospital Stay (HOSPITAL_COMMUNITY): Payer: Medicare Other | Admitting: Certified Registered"

## 2021-05-10 ENCOUNTER — Encounter (HOSPITAL_COMMUNITY)
Admission: EM | Disposition: A | Discharge status: Home Health | Payer: Self-pay | Source: Home / Self Care | Attending: Student

## 2021-05-10 ENCOUNTER — Encounter (HOSPITAL_COMMUNITY): Payer: Self-pay | Admitting: Internal Medicine

## 2021-05-10 HISTORY — PX: ORIF ANKLE FRACTURE: SHX5408

## 2021-05-10 LAB — CBC
HCT: 37.6 % (ref 36.0–46.0)
Hemoglobin: 12.7 g/dL (ref 12.0–15.0)
MCH: 31.1 pg (ref 26.0–34.0)
MCHC: 33.8 g/dL (ref 30.0–36.0)
MCV: 92.2 fL (ref 80.0–100.0)
Platelets: 120 10*3/uL — ABNORMAL LOW (ref 150–400)
RBC: 4.08 MIL/uL (ref 3.87–5.11)
RDW: 12.4 % (ref 11.5–15.5)
WBC: 6.5 10*3/uL (ref 4.0–10.5)
nRBC: 0 % (ref 0.0–0.2)

## 2021-05-10 SURGERY — OPEN REDUCTION INTERNAL FIXATION (ORIF) ANKLE FRACTURE
Anesthesia: General | Site: Ankle | Laterality: Right

## 2021-05-10 MED ORDER — PROPOFOL 10 MG/ML IV BOLUS
INTRAVENOUS | Status: DC | PRN
Start: 2021-05-10 — End: 2021-05-10
  Administered 2021-05-10: 200 mg via INTRAVENOUS

## 2021-05-10 MED ORDER — CEFAZOLIN SODIUM-DEXTROSE 2-3 GM-%(50ML) IV SOLR
INTRAVENOUS | Status: DC | PRN
  Administered 2021-05-10: 2 g via INTRAVENOUS

## 2021-05-10 MED ORDER — BUPIVACAINE HCL (PF) 0.25 % IJ SOLN
INTRAMUSCULAR | Status: DC | PRN
  Administered 2021-05-10: 20 mL

## 2021-05-10 MED ORDER — ACETAMINOPHEN 10 MG/ML IV SOLN
INTRAVENOUS | Status: AC
  Filled 2021-05-10: qty 100

## 2021-05-10 MED ORDER — DEXAMETHASONE SODIUM PHOSPHATE 10 MG/ML IJ SOLN
INTRAMUSCULAR | Status: DC | PRN
  Administered 2021-05-10: 10 mg via INTRAVENOUS

## 2021-05-10 MED ORDER — CEFAZOLIN SODIUM 1 G IJ SOLR
INTRAMUSCULAR | Status: AC
  Filled 2021-05-10: qty 20

## 2021-05-10 MED ORDER — PHENYLEPHRINE HCL-NACL 20-0.9 MG/250ML-% IV SOLN
INTRAVENOUS | Status: DC | PRN
Start: 2021-05-10 — End: 2021-05-10
  Administered 2021-05-10: 40 ug/min via INTRAVENOUS

## 2021-05-10 MED ORDER — ONDANSETRON HCL 4 MG/2ML IJ SOLN
INTRAMUSCULAR | Status: DC | PRN
  Administered 2021-05-10: 4 mg via INTRAVENOUS

## 2021-05-10 MED ORDER — LACTATED RINGERS IV SOLN: INTRAVENOUS | Status: DC | PRN

## 2021-05-10 MED ORDER — ACETAMINOPHEN 10 MG/ML IV SOLN
INTRAVENOUS | Status: DC | PRN
  Administered 2021-05-10: 1000 mg via INTRAVENOUS

## 2021-05-10 MED ORDER — VANCOMYCIN HCL 1000 MG IV SOLR
INTRAVENOUS | Status: AC
  Filled 2021-05-10: qty 20

## 2021-05-10 MED ORDER — SODIUM CHLORIDE 0.9 % IR SOLN
Status: DC | PRN
  Administered 2021-05-10: 1000 mL

## 2021-05-10 MED ORDER — VANCOMYCIN HCL 1000 MG IV SOLR
INTRAVENOUS | Status: DC | PRN
  Administered 2021-05-10: 1000 mg via TOPICAL

## 2021-05-10 MED ORDER — FENTANYL CITRATE (PF) 250 MCG/5ML IJ SOLN
INTRAMUSCULAR | Status: DC | PRN
  Administered 2021-05-10 (×2): 50 ug via INTRAVENOUS

## 2021-05-10 MED ORDER — ENOXAPARIN SODIUM 40 MG/0.4ML IJ SOSY
40.0000 mg | PREFILLED_SYRINGE | INTRAMUSCULAR | Status: DC
  Administered 2021-05-11 – 2021-05-13 (×3): 40 mg via SUBCUTANEOUS
  Filled 2021-05-10 (×3): qty 0.4

## 2021-05-10 MED ORDER — DEXAMETHASONE SODIUM PHOSPHATE 10 MG/ML IJ SOLN
INTRAMUSCULAR | Status: AC
  Filled 2021-05-10: qty 1

## 2021-05-10 MED ORDER — CEFAZOLIN SODIUM-DEXTROSE 2-4 GM/100ML-% IV SOLN
2.0000 g | Freq: Three times a day (TID) | INTRAVENOUS | Status: AC
  Administered 2021-05-10 (×2): 2 g via INTRAVENOUS
  Filled 2021-05-10 (×2): qty 100

## 2021-05-10 MED ORDER — LIDOCAINE 2% (20 MG/ML) 5 ML SYRINGE
INTRAMUSCULAR | Status: AC
  Filled 2021-05-10: qty 5

## 2021-05-10 MED ORDER — FENTANYL CITRATE (PF) 250 MCG/5ML IJ SOLN
INTRAMUSCULAR | Status: AC
  Filled 2021-05-10: qty 5

## 2021-05-10 MED ORDER — PROPOFOL 10 MG/ML IV BOLUS
INTRAVENOUS | Status: AC
  Filled 2021-05-10: qty 20

## 2021-05-10 MED ORDER — LIDOCAINE 2% (20 MG/ML) 5 ML SYRINGE
INTRAMUSCULAR | Status: DC | PRN
  Administered 2021-05-10: 60 mg via INTRAVENOUS

## 2021-05-10 MED ORDER — BUPIVACAINE HCL (PF) 0.25 % IJ SOLN
INTRAMUSCULAR | Status: AC
  Filled 2021-05-10: qty 30

## 2021-05-10 MED ORDER — ONDANSETRON HCL 4 MG/2ML IJ SOLN
INTRAMUSCULAR | Status: AC
  Filled 2021-05-10: qty 2

## 2021-05-10 MED ORDER — VITAMIN D 25 MCG (1000 UNIT) PO TABS
1000.0000 [IU] | ORAL_TABLET | Freq: Every day | ORAL | Status: DC
  Administered 2021-05-12 – 2021-05-13 (×2): 1000 [IU] via ORAL
  Filled 2021-05-10 (×3): qty 1

## 2021-05-10 SURGICAL SUPPLY — 99 items
ALCOHOL 70% 16 OZ (MISCELLANEOUS) ×1 IMPLANT
BAG COUNTER SPONGE SURGICOUNT (BAG) ×2 IMPLANT
BANDAGE ESMARK 6X9 LF (GAUZE/BANDAGES/DRESSINGS) IMPLANT
BIT DRILL 2 CANN GRADUATED (BIT) ×1 IMPLANT
BIT DRILL 2.5 CANN LNG (BIT) ×1 IMPLANT
BIT DRILL 2.6 CANN (BIT) ×1 IMPLANT
BIT DRILL 2.7 (BIT) ×1
BIT DRILL 2.7X2.7/3XSCR ANKL (BIT) IMPLANT
BIT DRL 2.7X2.7/3XSCR ANKL (BIT) ×1
BNDG COHESIVE 6X5 TAN NS LF (GAUZE/BANDAGES/DRESSINGS) ×2 IMPLANT
BNDG ELASTIC 4X5.8 VLCR STR LF (GAUZE/BANDAGES/DRESSINGS) ×1 IMPLANT
BNDG ELASTIC 6X5.8 VLCR STR LF (GAUZE/BANDAGES/DRESSINGS) ×1 IMPLANT
BNDG ESMARK 6X9 LF (GAUZE/BANDAGES/DRESSINGS) ×2
CANISTER SUCT 3000ML PPV (MISCELLANEOUS) ×2 IMPLANT
COVER SURGICAL LIGHT HANDLE (MISCELLANEOUS) ×2 IMPLANT
CUFF TOURN SGL QUICK 34 (TOURNIQUET CUFF) ×1
CUFF TRNQT CYL 34X4.125X (TOURNIQUET CUFF) IMPLANT
DERMABOND ADVANCED (GAUZE/BANDAGES/DRESSINGS)
DERMABOND ADVANCED .7 DNX12 (GAUZE/BANDAGES/DRESSINGS) ×1 IMPLANT
DRAPE C-ARM 42X72 X-RAY (DRAPES) ×2 IMPLANT
DRAPE C-ARMOR (DRAPES) ×2 IMPLANT
DRAPE HIP W/POCKET STRL (MISCELLANEOUS) ×1 IMPLANT
DRAPE IMP U-DRAPE 54X76 (DRAPES) ×4 IMPLANT
DRAPE INCISE 23X17 IOBAN STRL (DRAPES)
DRAPE INCISE 23X17 STRL (DRAPES) ×1 IMPLANT
DRAPE INCISE IOBAN 23X17 STRL (DRAPES) IMPLANT
DRAPE INCISE IOBAN 66X45 STRL (DRAPES) ×1 IMPLANT
DRAPE U-SHAPE 47X51 STRL (DRAPES) ×2 IMPLANT
DRSG ADAPTIC 3X8 NADH LF (GAUZE/BANDAGES/DRESSINGS) ×1 IMPLANT
DRSG AQUACEL AG ADV 3.5X 6 (GAUZE/BANDAGES/DRESSINGS) ×1 IMPLANT
DRSG PAD ABDOMINAL 8X10 ST (GAUZE/BANDAGES/DRESSINGS) IMPLANT
ELECT REM PT RETURN 9FT ADLT (ELECTROSURGICAL) ×2
ELECTRODE REM PT RTRN 9FT ADLT (ELECTROSURGICAL) ×1 IMPLANT
GAUZE SPONGE 4X4 12PLY STRL (GAUZE/BANDAGES/DRESSINGS) IMPLANT
GLOVE SRG 8 PF TXTR STRL LF DI (GLOVE) ×2 IMPLANT
GLOVE SURG ENC MOIS LTX SZ8 (GLOVE) ×4 IMPLANT
GLOVE SURG UNDER POLY LF SZ8 (GLOVE) ×2
GOWN STRL REUS W/ TWL LRG LVL3 (GOWN DISPOSABLE) ×2 IMPLANT
GOWN STRL REUS W/ TWL XL LVL3 (GOWN DISPOSABLE) ×1 IMPLANT
GOWN STRL REUS W/TWL LRG LVL3 (GOWN DISPOSABLE) ×2
GOWN STRL REUS W/TWL XL LVL3 (GOWN DISPOSABLE) ×1
GUIDEWIRE 1.35MM (WIRE) ×2 IMPLANT
HOOD PEEL AWAY FLYTE STAYCOOL (MISCELLANEOUS) ×2 IMPLANT
K-WIRE BB-TAK (WIRE) ×4
KIT BASIN OR (CUSTOM PROCEDURE TRAY) ×2 IMPLANT
KIT TURNOVER KIT B (KITS) ×2 IMPLANT
KWIRE BB-TAK (WIRE) IMPLANT
MANIFOLD NEPTUNE II (INSTRUMENTS) IMPLANT
NEEDLE 22X1 1/2 (OR ONLY) (NEEDLE) ×1 IMPLANT
NS IRRIG 1000ML POUR BTL (IV SOLUTION) ×2 IMPLANT
PACK ORTHO EXTREMITY (CUSTOM PROCEDURE TRAY) ×2 IMPLANT
PAD ARMBOARD 7.5X6 YLW CONV (MISCELLANEOUS) ×3 IMPLANT
PAD CAST 4YDX4 CTTN HI CHSV (CAST SUPPLIES) IMPLANT
PADDING CAST COTTON 4X4 STRL (CAST SUPPLIES) ×1
PADDING CAST COTTON 6X4 STRL (CAST SUPPLIES) ×1 IMPLANT
PLATE LOCK DIST FIB 5H RT (Plate) ×1 IMPLANT
SCREW BN T10 FT 20X2.7XST CORT (Screw) IMPLANT
SCREW CORT 2.7X20 (Screw) ×1 IMPLANT
SCREW LOCKING 2.7X12 ANKLE (Screw) ×1 IMPLANT
SCREW LOCKING 2.7X14MM (Screw) ×3 IMPLANT
SCREW LOW PROFILE 3.5X14 (Screw) ×2 IMPLANT
SCREW LOW PROFILE CANN 4.0X45 (Screw) ×2 IMPLANT
SCREW NLOCK CORT 2.7X32 (Screw) ×1 IMPLANT
SCREW NON-LOCKING 3.5X12MM (Screw) ×1 IMPLANT
SCREW NON-LOCKING 3.5X22MM (Screw) ×1 IMPLANT
SEALER BIPOLAR AQUA 2.3 (INSTRUMENTS) ×1 IMPLANT
SET INTERPULSE LAVAGE W/TIP (ORTHOPEDIC DISPOSABLE SUPPLIES) ×1 IMPLANT
SPLINT PLASTER CAST XFAST 5X30 (CAST SUPPLIES) IMPLANT
SPLINT PLASTER XFAST SET 5X30 (CAST SUPPLIES) ×1
SPONGE T-LAP 18X18 ~~LOC~~+RFID (SPONGE) ×2 IMPLANT
STOCKINETTE IMPERVIOUS 9X36 MD (GAUZE/BANDAGES/DRESSINGS) ×2 IMPLANT
STOCKINETTE IMPERVIOUS LG (DRAPES) ×2 IMPLANT
STRIP CLOSURE SKIN 1/2X4 (GAUZE/BANDAGES/DRESSINGS) IMPLANT
SUCTION FRAZIER HANDLE 10FR (MISCELLANEOUS) ×1
SUCTION TUBE FRAZIER 10FR DISP (MISCELLANEOUS) ×1 IMPLANT
SUT ETHIBOND 2 V 37 (SUTURE) ×2 IMPLANT
SUT ETHILON 3 0 FSL (SUTURE) ×3 IMPLANT
SUT MNCRL AB 4-0 PS2 18 (SUTURE) IMPLANT
SUT MON AB 2-0 CT1 27 (SUTURE) IMPLANT
SUT VIC AB 0 CT1 27 (SUTURE) ×1
SUT VIC AB 0 CT1 27XBRD ANBCTR (SUTURE) ×1 IMPLANT
SUT VIC AB 0 CT2 27 (SUTURE) ×1 IMPLANT
SUT VIC AB 1 CT1 27 (SUTURE)
SUT VIC AB 1 CT1 27XBRD ANTBC (SUTURE) ×1 IMPLANT
SUT VIC AB 2-0 SH 27 (SUTURE) ×2
SUT VIC AB 2-0 SH 27XBRD (SUTURE) ×1 IMPLANT
SUT VLOC 180 0 9IN  GS21 (SUTURE)
SUT VLOC 180 0 9IN GS21 (SUTURE) ×1 IMPLANT
SYR BULB IRRIG 60ML STRL (SYRINGE) ×2 IMPLANT
SYR CONTROL 10ML LL (SYRINGE) ×1 IMPLANT
TOWEL GREEN STERILE (TOWEL DISPOSABLE) ×2 IMPLANT
TOWEL GREEN STERILE FF (TOWEL DISPOSABLE) ×2 IMPLANT
TUBE CONNECTING 12X1/4 (SUCTIONS) ×2 IMPLANT
TUBE SUCT ARGYLE STRL (TUBING) ×1 IMPLANT
UNDERPAD 30X36 HEAVY ABSORB (UNDERPADS AND DIAPERS) ×2 IMPLANT
WASHER (Orthopedic Implant) ×3 IMPLANT
WASHER ORTHO 7X (Orthopedic Implant) IMPLANT
WATER STERILE IRR 1000ML POUR (IV SOLUTION) ×2 IMPLANT
YANKAUER SUCT BULB TIP NO VENT (SUCTIONS) ×1 IMPLANT

## 2021-05-10 NOTE — Interval H&P Note (Signed)
The patient has been re-examined, and the chart reviewed, and there have been no interval changes to the documented history and physical.    The risks benefits and alternatives were discussed with the patient including but not limited to the risks of nonoperative treatment, versus surgical intervention including infection, bleeding, nerve injury, malunion, nonunion, the need for revision surgery, hardware prominence, hardware failure, the need for hardware removal, blood clots, cardiopulmonary complications, morbidity, mortality, among others, and they were willing to proceed.  Consent was signed by myself and the patient.  Right leg was marked.

## 2021-05-10 NOTE — Anesthesia Preprocedure Evaluation (Addendum)
Anesthesia Evaluation  Patient identified by MRN, date of birth, ID band Patient awake    Reviewed: Allergy & Precautions, NPO status , Patient's Chart, lab work & pertinent test results  Airway Mallampati: III  TM Distance: >3 FB Neck ROM: Full    Dental  (+) Edentulous Lower, Edentulous Upper   Pulmonary COPD,  COPD inhaler, Current Smoker and Patient abstained from smoking.,    Pulmonary exam normal        Cardiovascular hypertension, Pt. on medications and Pt. on home beta blockers +CHF  Normal cardiovascular exam  ECG: SR, rate 78   Neuro/Psych PSYCHIATRIC DISORDERS Anxiety Bipolar Disorder Essential tremor    GI/Hepatic Neg liver ROS, GERD  Medicated,  Endo/Other  Hypothyroidism   Renal/GU negative Renal ROS     Musculoskeletal   Abdominal   Peds  Hematology negative hematology ROS (+)   Anesthesia Other Findings RIGHT ANKLE FRACTURE COVID POSITIVE  Reproductive/Obstetrics                           Anesthesia Physical Anesthesia Plan  ASA: 3  Anesthesia Plan: General   Post-op Pain Management:    Induction: Intravenous  PONV Risk Score and Plan: 2 and Ondansetron, Dexamethasone and Treatment may vary due to age or medical condition  Airway Management Planned: LMA  Additional Equipment:   Intra-op Plan:   Post-operative Plan: Extubation in OR  Informed Consent: I have reviewed the patients History and Physical, chart, labs and discussed the procedure including the risks, benefits and alternatives for the proposed anesthesia with the patient or authorized representative who has indicated his/her understanding and acceptance.       Plan Discussed with: CRNA  Anesthesia Plan Comments:        Anesthesia Quick Evaluation

## 2021-05-10 NOTE — Anesthesia Postprocedure Evaluation (Signed)
Anesthesia Post Note  Patient: Laurie Moreno  Procedure(s) Performed: OPEN REDUCTION INTERNAL FIXATION (ORIF) ANKLE FRACTURE (Right: Ankle)     Patient location during evaluation: PACU Anesthesia Type: General Level of consciousness: awake Pain management: pain level controlled Vital Signs Assessment: post-procedure vital signs reviewed and stable Respiratory status: spontaneous breathing, nonlabored ventilation, respiratory function stable and patient connected to nasal cannula oxygen Cardiovascular status: blood pressure returned to baseline and stable Postop Assessment: no apparent nausea or vomiting Anesthetic complications: no   No notable events documented.  Last Vitals:  Vitals:   05/10/21 1146 05/10/21 1449  BP: 137/72   Pulse: 72   Resp: 17   Temp: 36.9 C   SpO2: (!) 88% 94%    Last Pain:  Vitals:   05/10/21 1810  TempSrc:   PainSc: 10-Worst pain ever                 Finesse Fielder P Der Gagliano

## 2021-05-10 NOTE — Care Plan (Signed)
Pt received from PACU via bed.  Pt alert and oriented x 4.  Pt on RA no distress noted.  Bandage and splint to R leg are clean dry and intact. Pt is able to move her R toes without difficulty.  Skin is warm and dry. Pedal pulses 2+ to BLE.  Has some sensation in her Rt foot  and is able to sense pain. Declined pain medication at this time.

## 2021-05-10 NOTE — Op Note (Addendum)
05/10/2021  PATIENT:  Laurie Moreno    PRE-OPERATIVE DIAGNOSIS: Right bimalleolar ankle fracture  POST-OPERATIVE DIAGNOSIS:  Same  PROCEDURE:  OPEN REDUCTION INTERNAL FIXATION (ORIF) ANKLE FRACTURE  SURGEON:  Brylin Stopper A Dariann Huckaba, MD  PHYSICIAN ASSISTANT: none  ANESTHESIA:   General  ESTIMATED BLOOD LOSS: 25cc  PREOPERATIVE INDICATIONS:  Laurie Moreno is a  71 y.o. female with a diagnosis of Right bimalleolar ankle fracture who elected for surgical management to minimize the risk for malunion and nonunion and post-traumatic arthritis.    The risks benefits and alternatives were discussed with the patient preoperatively including but not limited to the risks of infection, bleeding, nerve injury, cardiopulmonary complications, the need for revision surgery, the need for hardware removal, among others, and the patient was willing to proceed.  OPERATIVE IMPLANTS:  Implant Name Type Inv. Item Serial No. Manufacturer Lot No. LRB No. Used Action  SCREW CORT 2.7X20 - [id number] Screw SCREW CORT 2.7X20  [employer] ON TRAY Right 1 Implanted  SCREW LOW PROFILE 3.5X14 - [id number] Screw SCREW LOW PROFILE 3.5X14  [employer] ON TRAY Right 2 Implanted  SCREW LOCKING 2.7X14MM - [id number] Screw SCREW LOCKING 2.7X14MM  [employer] ON TRAY Right 3 Implanted  SCREW LOCKING 2.7X12 ANKLE - [id number] Screw SCREW LOCKING 2.7X12 ANKLE  [employer] ON TRAY Right 1 Implanted  SCREW NON-LOCKING 3.5X12MM - [id number] Screw SCREW NON-LOCKING 3.5X12MM  [employer] ON TRAY Right 1 Implanted  PLATE LOCK DIST FIB 5H RT - [id number] Plate PLATE LOCK DIST FIB 5H RT  [employer] ON TRAY Right 1 Implanted  SCREW LOW PROFILE CANN 4.0X45 - [id number] Screw SCREW LOW PROFILE CANN 4.0X45  [employer] ON TRAY Right 2 Implanted  WASHER - [id number] Orthopedic Implant WASHER  [employer] ON TRAY Right 3 Implanted  SCREW NON-LOCKING 3.5X22MM - [id number] Screw SCREW NON-LOCKING 3.5X22MM  [employer] ON TRAY Right 1 Implanted     OPERATIVE PROCEDURE: The patient was brought to the operating room and placed in the supine position. All bony prominences were padded. Non sterile Tourniquet was placed. General anesthesia was administered. The lower extremity was prepped and draped in the usual sterile fashion.  Time out was performed.   Incision was made over the distal fibula and the fracture was exposed and reduced anatomically with a clamp. A 2.7 lag screw was placed. Dissection for the plate proximally was performed with metz to bluntly spread and to protect crossing branches of the superficial peroneal nerve. I then applied an appropriately sized distal fibular locking plate and secured it proximally with non locking screws and distally with locking screws. Bone quality was poor. I used c-arm to confirm satisfactory reduction and fixation.  Additionally there was a small anterior fragment off the fibula distally with anterior tibiofibular ligament attached.  This was provisionally fixed with a 2.7 mm cortical screw.  I then turned my attention to the medial malleolus. Incision was made over the medial malleolus and the fracture exposed and held provisionally with a clamp.  2 guidepins were placed for a 4.0 mm cannulated screw and then confirmation of reduction was made with fluoroscopy. I then placed two 14mm partially threaded cannulated screws with washer.  The reduction clamp was removed and the fracture was stable.  The syndesmosis was stressed with dorsiflexion external rotation under fluoroscopy and found to be stable.   Looking again at the lateral side, the small distal anterior fragment was noted to have broken again and the screw no longer holding  fixation.  I elected to remove the screw, secured the fragment again with a pointed reduction clamp and drilled for a 3 5 screw which was then placed with a washer to better hold the comminuted fracture fragments.  The wounds were irrigated, and 1 g of Vanco powder was  placed in the wounds.  And closed with 0 Vicryl, 2-0 vicryl.  3-0 nylon closure for the skin.  Adaptic and Sterile gauze was applied followed by a short leg splint. She was awakened and returned to the PACU in stable and satisfactory condition. There were no complications.  Post op recs: WB: NWB LLE Imaging: PACU xrays Dressing: keep splint intact until follow up DVT prophylaxis: Lovenox daily for 4 weeks starting postop day 1 Follow up: 2 weeks after surgery for a wound check and suture removal with Dr. Zachery Dakins at [facility].  Address: [address], [place], [place] [address]  Office Phone: [phone]  Charlies Constable, MD Orthopaedic Surgery

## 2021-05-10 NOTE — Anesthesia Procedure Notes (Signed)
Procedure Name: LMA Insertion Date/Time: 05/10/2021 8:18 AM Performed by: Reece Agar, CRNA Pre-anesthesia Checklist: Patient identified, Emergency Drugs available, Suction available and Patient being monitored Patient Re-evaluated:Patient Re-evaluated prior to induction Oxygen Delivery Method: Circle System Utilized Preoxygenation: Pre-oxygenation with 100% oxygen Induction Type: IV induction Ventilation: Mask ventilation without difficulty LMA: LMA inserted LMA Size: 4.0 Number of attempts: 1 Airway Equipment and Method: Bite block Placement Confirmation: positive ETCO2 Tube secured with: Tape Dental Injury: Teeth and Oropharynx as per pre-operative assessment

## 2021-05-10 NOTE — Transfer of Care (Signed)
Immediate Anesthesia Transfer of Care Note  Patient: Laurie Moreno  Procedure(s) Performed: OPEN REDUCTION INTERNAL FIXATION (ORIF) ANKLE FRACTURE (Right: Ankle)  Patient Location: PACU  Anesthesia Type:General  Level of Consciousness: awake and alert   Airway & Oxygen Therapy: Patient Spontanous Breathing and Patient connected to face mask oxygen  Post-op Assessment: Report given to RN and Post -op Vital signs reviewed and stable  Post vital signs: Reviewed and stable  Last Vitals:  Vitals Value Taken Time  BP 148/80 05/10/21 1103  Temp    Pulse 80 05/10/21 1106  Resp 24 05/10/21 1106  SpO2 92 % 05/10/21 1106  Vitals shown include unvalidated device data.  Last Pain:  Vitals:   05/10/21 0740  TempSrc:   PainSc: 0-No pain      Patients Stated Pain Goal: 0 (41/74/08 1448)  Complications: No notable events documented.

## 2021-05-10 NOTE — Progress Notes (Signed)
PROGRESS NOTE  Laurie Moreno GGY:[id number] DOB: Feb 08, 1951   PCP: Derek Jack, MD  Patient is from: Home.  Lives with sister.  Independently ambulates at baseline.  Does not drive.  DOA: 05/07/2021 LOS: 3  Chief complaints:  Chief Complaint  Patient presents with   Fall   Ankle Pain     Brief Narrative / Interim history: 71 year old F with PMH of diastolic CHF, bipolar disorder, hand tremor, COPD, tobacco use disorder and left breast cancer status postmastectomy presenting to [facility], ED with right ankle pain and deformity after she had an accidental fall on the way to her psychiatrist office. She slipped due to rain, and fell backward, and struck the back of the head and twisted her right ankle, she had immediate ankle pain, unable to bear weight, she denies any loss of consciousness.  X-ray showed bimalleolar right ankle fracture.  She also tested positive for COVID-19.  She is not symptomatic from COVID infection.  She is vaccinated and boosted.  Transferred to [facility] due to lack of [place] room at [facility].   Patient underwent ORIF by Dr. Zachery Dakins on 05/10/2021    Subjective: Seen and examined earlier this afternoon after surgery.  No complaints.  Objective: Vitals:   05/10/21 1105 05/10/21 1118 05/10/21 1122 05/10/21 1146  BP: (!) 148/80 (!) 148/82 (!) 150/84 137/72  Pulse: 80 77 75 72  Resp: 20 15 20 17   Temp: 97.8 F (36.6 C)  97.8 F (36.6 C) 98.5 F (36.9 C)  TempSrc:    Oral  SpO2: 93% 91% 92% (!) 88%  Weight:      Height:        Examination:  GENERAL: No apparent distress.  Nontoxic. HEENT: MMM.  Vision and hearing grossly intact.  NECK: Supple.  No apparent JVD.  RESP: 92% on RA.  No IWOB.  Fair aeration bilaterally. CVS:  RRR. Heart sounds normal.  ABD/GI/GU: BS+. Abd soft, NTND.  MSK/EXT:  Moves extremities.  Dressing over RLE. SKIN: Dressing over RLE. NEURO: Awake and alert. Oriented appropriately.  No apparent focal neuro deficit. PSYCH:  Calm. Normal affect.   Procedures:  ORIF of right bimalleolar fracture by Dr. Zachery Dakins on 05/10/2021   Microbiology summarized: COVID-19 PCR positive. Influenza PCR negative.  Assessment & Plan: Accidental fall-slipped and fell Right ankle fracture/bimalleolar fracture -S/p ORIF. -Pain control and VTE prophylaxis per surgery -NWB LLE -PT/OT  COVID-19 infection-incidental finding.  Vaccinated x2 and booster x3.  As symptomatic. -Continue airborne and contact precaution for 10 days per protocol -No indication for treatment.   History of osteoporosis: Reportedly received Prolia a week prior to admission.  -Outpatient follow-up   Hypertension: Normotensive. -Continue low-dose Coreg. -Continue holding lisinopril.   Chronic COPD: No respiratory symptoms. -Continue albuterol as needed.   History of breast cancer s/p left mastectomy -continue with anastrozole -Outpatient follow-up with oncologist.   hyperlipidemia -Continue with statin   Bipolar disorder: Stable. -Continue with depakote, Lexapro , gabapentin, and Klonopin   Hand tremors -On primidone   Hypothyroidism -Continue with Synthroid  Chronic thrombocytopenia: Platelets slightly lower than baseline but is stable. -Continue monitoring  Tobacco use disorder: Reports smoking about 10 to 14 cigarettes a day. -Counseled and encouraged cessation -Nicotine patch  Body mass index is 28.32 kg/m.         DVT prophylaxis:  enoxaparin (LOVENOX) injection 40 mg Start: 05/11/21 0900 SCDs Start: 05/07/21 2238  Code Status: Full code Family Communication: Patient and/or RN. Available if any question.  Level of care: Med-Surg Status is: Inpatient  Remains inpatient appropriate because: Postop care after right ankle fracture   Final disposition: TBD after surgery.    Consultants:  Orthopedic surgery   Sch Meds:  Scheduled Meds:  anastrozole  1 mg Oral Daily   atorvastatin  40 mg Oral Daily    calcium-vitamin D  1 tablet Oral BID   carbidopa-levodopa  1.5 tablet Oral TID   carvedilol  3.125 mg Oral BID WC   Chlorhexidine Gluconate Cloth  6 each Topical Q0600   [START ON 05/11/2021] cholecalciferol  1,000 Units Oral Daily   cholecalciferol  2,000 Units Oral Daily   clonazePAM  0.5 mg Oral QHS   divalproex  250 mg Oral TID   [START ON 05/11/2021] enoxaparin  40 mg Subcutaneous Q24H   escitalopram  20 mg Oral Daily   gabapentin  300 mg Oral BID   levothyroxine  125 mcg Oral Q0600   molnupiravir EUA  4 capsule Oral BID   multivitamin with minerals  1 tablet Oral q morning   mupirocin ointment  1 application Nasal BID   nicotine  14 mg Transdermal Daily   oxybutynin  5 mg Oral Daily   pantoprazole  40 mg Oral Daily   primidone  100 mg Oral BID   Continuous Infusions:   ceFAZolin (ANCEF) IV     dextrose 5% lactated ringers 75 mL/hr at 05/09/21 2258   PRN Meds:.acetaminophen **OR** acetaminophen, albuterol, hydrOXYzine, morphine injection, oxyCODONE  Antimicrobials: Anti-infectives (From admission, onward)    Start     Dose/Rate Route Frequency Ordered Stop   05/10/21 1400  ceFAZolin (ANCEF) IVPB 2g/100 mL premix        2 g 200 mL/hr over 30 Minutes Intravenous Every 8 hours 05/10/21 1155 05/11/21 0559   05/10/21 1007  vancomycin (VANCOCIN) powder  Status:  Discontinued          As needed 05/10/21 1007 05/10/21 1058   05/09/21 0700  vancomycin (VANCOCIN) IVPB 1000 mg/200 mL premix        1,000 mg 200 mL/hr over 60 Minutes Intravenous On call 05/08/21 1315 05/10/21 0700   05/09/21 0600  ceFAZolin (ANCEF) IVPB 2g/100 mL premix        2 g 200 mL/hr over 30 Minutes Intravenous On call to O.R. 05/08/21 1314 05/10/21 0559   05/07/21 2345  molnupiravir EUA (LAGEVRIO) capsule 800 mg        4 capsule Oral 2 times daily 05/07/21 2124 05/12/21 2159   05/07/21 2200  nirmatrelvir/ritonavir EUA (PAXLOVID) 3 tablet  Status:  Discontinued        3 tablet Oral 2 times daily 05/07/21 2110  05/07/21 2119        I have personally reviewed the following labs and images: CBC: Recent Labs  Lab 05/07/21 1403 05/08/21 0301 05/09/21 0558 05/10/21 0303  WBC 5.0 5.6 6.4 6.5  NEUTROABS 1.8  --   --   --   HGB 14.2 12.4 12.6 12.7  HCT 42.4 37.6 38.6 37.6  MCV 95.3 92.6 92.3 92.2  PLT 130* 116* 115* 120*   BMP &GFR Recent Labs  Lab 05/07/21 1403 05/08/21 0301 05/09/21 0558  NA 140 138 140  K 4.8 4.3 4.9  CL 104 106 104  CO2 26 25 27   GLUCOSE 103* 172* 138*  BUN 17 15 17   CREATININE 0.72 0.61 0.67  CALCIUM 8.6* 8.3* 7.8*  MG  --   --  2.1  PHOS  --   --  3.6   Estimated Creatinine Clearance: 64.8 mL/min (by C-G formula based on SCr of 0.67 mg/dL). Liver & Pancreas: Recent Labs  Lab 05/09/21 0558  ALBUMIN 3.4*   No results for input(s): LIPASE, AMYLASE in the last 168 hours. No results for input(s): AMMONIA in the last 168 hours. Diabetic: No results for input(s): HGBA1C in the last 72 hours. No results for input(s): GLUCAP in the last 168 hours. Cardiac Enzymes: No results for input(s): CKTOTAL, CKMB, CKMBINDEX, TROPONINI in the last 168 hours. No results for input(s): PROBNP in the last 8760 hours. Coagulation Profile: No results for input(s): INR, PROTIME in the last 168 hours. Thyroid Function Tests: No results for input(s): TSH, T4TOTAL, FREET4, T3FREE, THYROIDAB in the last 72 hours. Lipid Profile: No results for input(s): CHOL, HDL, LDLCALC, TRIG, CHOLHDL, LDLDIRECT in the last 72 hours. Anemia Panel: No results for input(s): VITAMINB12, FOLATE, FERRITIN, TIBC, IRON, RETICCTPCT in the last 72 hours. Urine analysis:    Component Value Date/Time   COLORURINE YELLOW 03/24/2018 0530   APPEARANCEUR CLEAR 03/24/2018 0530   LABSPEC 1.013 03/24/2018 0530   PHURINE 6.0 03/24/2018 0530   GLUCOSEU NEGATIVE 03/24/2018 0530   HGBUR SMALL (A) 03/24/2018 0530   BILIRUBINUR small (A) 02/14/2021 1450   KETONESUR negative 02/14/2021 1450   KETONESUR  NEGATIVE 03/24/2018 0530   PROTEINUR >=300 (A) 02/14/2021 1450   PROTEINUR NEGATIVE 03/24/2018 0530   UROBILINOGEN 0.2 02/14/2021 1450   NITRITE Positive (A) 02/14/2021 1450   NITRITE NEGATIVE 03/24/2018 0530   LEUKOCYTESUR Small (1+) (A) 02/14/2021 1450   Sepsis Labs: Invalid input(s): PROCALCITONIN, [place]  Microbiology: Recent Results (from the past 240 hour(s))  Resp Panel by RT-PCR (Flu A&B, Covid) Nasopharyngeal Swab     Status: Abnormal   Collection Time: 05/07/21  5:04 PM   Specimen: Nasopharyngeal Swab; Nasopharyngeal(NP) swabs in vial transport medium  Result Value Ref Range Status   SARS Coronavirus 2 by RT PCR POSITIVE (A) NEGATIVE Final    Comment: (NOTE) SARS-CoV-2 target nucleic acids are DETECTED.  The SARS-CoV-2 RNA is generally detectable in upper respiratory specimens during the acute phase of infection. Positive results are indicative of the presence of the identified virus, but do not rule out bacterial infection or co-infection with other pathogens not detected by the test. Clinical correlation with patient history and other diagnostic information is necessary to determine patient infection status. The expected result is Negative.  Fact Sheet for Patients: EntrepreneurPulse.com.au  Fact Sheet for Healthcare Providers: IncredibleEmployment.be  This test is not yet approved or cleared by the [place] FDA and  has been authorized for detection and/or diagnosis of SARS-CoV-2 by FDA under an Emergency Use Authorization (EUA).  This EUA will remain in effect (meaning this test can be used) for the duration of  the COVID-19 declaration under Section 564(b)(1) of the A ct, 21 U.S.C. section 360bbb-3(b)(1), unless the authorization is terminated or revoked sooner.     Influenza A by PCR NEGATIVE NEGATIVE Final   Influenza B by PCR NEGATIVE NEGATIVE Final    Comment: (NOTE) The Xpert Xpress SARS-CoV-2/FLU/RSV plus  assay is intended as an aid in the diagnosis of influenza from Nasopharyngeal swab specimens and should not be used as a sole basis for treatment. Nasal washings and aspirates are unacceptable for Xpert Xpress SARS-CoV-2/FLU/RSV testing.  Fact Sheet for Patients: EntrepreneurPulse.com.au  Fact Sheet for Healthcare Providers: IncredibleEmployment.be  This test is not yet approved or cleared by the [place] and has been authorized for  detection and/or diagnosis of SARS-CoV-2 by FDA under an Emergency Use Authorization (EUA). This EUA will remain in effect (meaning this test can be used) for the duration of the COVID-19 declaration under Section 564(b)(1) of the Act, 21 U.S.C. section 360bbb-3(b)(1), unless the authorization is terminated or revoked.  Performed at [facility], [address], [place], [place] [address]   Surgical pcr screen     Status: Abnormal   Collection Time: 05/07/21 11:45 PM   Specimen: Nasal Mucosa; Nasal Swab  Result Value Ref Range Status   MRSA, PCR POSITIVE (A) NEGATIVE Final    Comment: RESULT CALLED TO, READ BACK BY AND VERIFIED WITH: J ATANBILA,RN@0440  05/08/21 [place]    Staphylococcus aureus POSITIVE (A) NEGATIVE Final    Comment: (NOTE) The Xpert SA Assay (FDA approved for NASAL specimens in patients 67 years of age and older), is one component of a comprehensive surveillance program. It is not intended to diagnose infection nor to guide or monitor treatment. Performed at [facility], [place] [address], [place], [place] [address]     Radiology Studies: DG Ankle Complete Right  Result Date: 05/10/2021 CLINICAL DATA:  Fracture T14.8XXA (ICD-10-CM) EXAM: RIGHT ANKLE - COMPLETE 3+ VIEW COMPARISON:  05/07/2021. FINDINGS: Fluoro time: 51 seconds. Reported radiation dose: 121 mGy. Nine C-arm fluoroscopic images were obtained intraoperatively and submitted for post operative interpretation. These images  demonstrate plate screw fixation of the distal fibula and 2 screw fixation of the medial malleolus. Please see the performing provider's procedural report for further detail. IMPRESSION: Intraoperative fluoroscopy, as detailed above. Electronically Signed   By: Margaretha Sheffield M.D.   On: 05/10/2021 11:05   DG C-Arm 1-60 Min-No Report  Result Date: 05/10/2021 Fluoroscopy was utilized by the requesting physician.  No radiographic interpretation.   DG C-Arm 1-60 Min-No Report  Result Date: 05/10/2021 Fluoroscopy was utilized by the requesting physician.  No radiographic interpretation.       Tynisha Ogan T. Herrings  If 7PM-7AM, please contact night-coverage [email or web address] 05/10/2021, 1:23 PM

## 2021-05-10 NOTE — Plan of Care (Signed)

## 2021-05-10 NOTE — Discharge Instructions (Signed)
Diet: As you were doing prior to hospitalization   Shower:  If you have a splint on, leave the splint in place and keep the splint dry with a plastic bag.  Dressing:  If you have a splint, then just leave the splint in place and we will change your bandages during your first follow-up appointment.  If water gets in the splint or the splint gets saturated please call the clinic and we can see you to change your splint.   Activity:  Increase activity slowly as tolerated, but follow the weight bearing instructions below.  The rules on driving is that you can not be taking narcotics while you drive, and you must feel in control of the vehicle.    Weight Bearing:   Do not put any weight on your right foot until follow up.    Blood clot prevention (DVT Prophylaxis): After surgery you are at an increased risk for a blood clot. you were prescribed a blood thinner, lovenox, to be taken as prescribed for a total of 4 weeks from surgery to help reduce your risk of getting a blood clot. This will help prevent a blood clot. Signs of a pulmonary embolus (blood clot in the lungs) include sudden short of breath, feeling lightheaded or dizzy, chest pain with a deep breath, rapid pulse rapid breathing. Signs of a blood clot in your arms or legs include new unexplained swelling and cramping, warm, red or darkened skin around the painful area. Please call the office or 911 right away if these signs or symptoms develop. To prevent constipation: you may use a stool softener such as -  Colace (over the counter) 100 mg by mouth twice a day  Drink plenty of fluids (prune juice may be helpful) and high fiber foods Miralax (over the counter) for constipation as needed.    Itching:  If you experience itching with your medications, try taking only a single pain pill, or even half a pain pill at a time.  You may take up to 10 pain pills per day, and you can also use benadryl over the counter for itching or also to help with  sleep.   Precautions:  If you experience chest pain or shortness of breath - call 911 immediately for transfer to the hospital emergency department!!   Call office [phone]) for the following: Temperature greater than 101F Persistent nausea and vomiting Severe uncontrolled pain Redness, tenderness, or signs of infection (pain, swelling, redness, odor or green/yellow discharge around the site) Difficulty breathing, headache or visual disturbances Hives Persistent dizziness or light-headedness Extreme fatigue Any other questions or concerns you may have after discharge  In an emergency, call 911 or go to an Emergency Department at a nearby hospital  Follow- Up Appointment:  Please call for an appointment to be seen approximately 2-3 week after surgery in [facility] with your surgeon Dr. Charlies Constable - [phone] Address: [address] [place], [place], [place] [address]

## 2021-05-11 ENCOUNTER — Inpatient Hospital Stay (HOSPITAL_COMMUNITY): Payer: Medicare Other

## 2021-05-11 DIAGNOSIS — R5082 Postprocedural fever: Secondary | ICD-10-CM

## 2021-05-11 LAB — CBC
HCT: 38.9 % (ref 36.0–46.0)
Hemoglobin: 12.8 g/dL (ref 12.0–15.0)
MCH: 30.5 pg (ref 26.0–34.0)
MCHC: 32.9 g/dL (ref 30.0–36.0)
MCV: 92.6 fL (ref 80.0–100.0)
Platelets: 139 10*3/uL — ABNORMAL LOW (ref 150–400)
RBC: 4.2 MIL/uL (ref 3.87–5.11)
RDW: 12.7 % (ref 11.5–15.5)
WBC: 8.6 10*3/uL (ref 4.0–10.5)
nRBC: 0 % (ref 0.0–0.2)

## 2021-05-11 LAB — RENAL FUNCTION PANEL
Albumin: 3.3 g/dL — ABNORMAL LOW (ref 3.5–5.0)
Anion gap: 7 (ref 5–15)
BUN: 15 mg/dL (ref 8–23)
CO2: 26 mmol/L (ref 22–32)
Calcium: 8.8 mg/dL — ABNORMAL LOW (ref 8.9–10.3)
Chloride: 106 mmol/L (ref 98–111)
Creatinine, Ser: 0.75 mg/dL (ref 0.44–1.00)
GFR, Estimated: >60 mL/min (ref >60)
Glucose, Bld: 154 mg/dL — ABNORMAL HIGH (ref 70–99)
Phosphorus: 4.5 mg/dL (ref 2.5–4.6)
Potassium: 4.7 mmol/L (ref 3.5–5.1)
Sodium: 139 mmol/L (ref 135–145)

## 2021-05-11 LAB — MAGNESIUM: Magnesium: 1.7 mg/dL (ref 1.7–2.4)

## 2021-05-11 MED ORDER — POLYETHYLENE GLYCOL 3350 17 G PO PACK
17.0000 g | PACK | Freq: Two times a day (BID) | ORAL | Status: AC
  Administered 2021-05-11 (×2): 17 g via ORAL
  Filled 2021-05-11 (×2): qty 1

## 2021-05-11 MED ORDER — POLYETHYLENE GLYCOL 3350 17 G PO PACK: 17.0000 g | PACK | Freq: Two times a day (BID) | ORAL | Status: DC | PRN

## 2021-05-11 MED ORDER — DIPHENHYDRAMINE-ZINC ACETATE 2-0.1 % EX CREA
TOPICAL_CREAM | Freq: Three times a day (TID) | CUTANEOUS | Status: DC | PRN
  Administered 2021-05-11: 1 via TOPICAL
  Filled 2021-05-11: qty 28

## 2021-05-11 MED ORDER — HYDROXYZINE HCL 25 MG PO TABS
25.0000 mg | ORAL_TABLET | Freq: Three times a day (TID) | ORAL | Status: DC | PRN
  Administered 2021-05-11 – 2021-05-12 (×2): 25 mg via ORAL
  Filled 2021-05-11 (×2): qty 1

## 2021-05-11 MED ORDER — SENNOSIDES-DOCUSATE SODIUM 8.6-50 MG PO TABS: 1.0000 | ORAL_TABLET | Freq: Two times a day (BID) | ORAL | Status: DC | PRN

## 2021-05-11 MED ORDER — SENNOSIDES-DOCUSATE SODIUM 8.6-50 MG PO TABS
1.0000 | ORAL_TABLET | Freq: Two times a day (BID) | ORAL | Status: AC
  Administered 2021-05-11 – 2021-05-12 (×3): 1 via ORAL
  Filled 2021-05-11 (×3): qty 1

## 2021-05-11 NOTE — Progress Notes (Signed)
PROGRESS NOTE  Laurie Moreno YSA:[id number] DOB: 07-04-1950   PCP: Derek Jack, MD  Patient is from: Home.  Lives with sister.  Independently ambulates at baseline.  Does not drive.  DOA: 05/07/2021 LOS: 4  Chief complaints:  Chief Complaint  Patient presents with   Fall   Ankle Pain     Brief Narrative / Interim history: 71 year old F with PMH of diastolic CHF, bipolar disorder, hand tremor, COPD, tobacco use disorder and left breast cancer status postmastectomy presenting to [facility] ED with right ankle pain and deformity after she had an accidental fall on the way to her psychiatrist office. She slipped due to rain, and fell backward, and struck the back of the head and twisted her right ankle, she had immediate ankle pain, unable to bear weight, she denies any loss of consciousness.  X-ray showed bimalleolar right ankle fracture.  She also tested positive for COVID-19.  She is not symptomatic from COVID infection.  She is vaccinated and boosted.  Transferred to [facility] due to lack of [place] room at [facility].   Patient underwent ORIF by Dr. Zachery Dakins on 05/10/2021    Subjective: Seen and examined earlier this morning.  No major events overnight.  Mild temp to 100.5 earlier this morning.  Hemodynamically stable.  Denies respiratory, GI or UTI symptoms other than constipation.  Doing well on incentive spirometry.  Foley catheter out.  She is anxious about the fever.  Objective: Vitals:   05/10/21 1449 05/10/21 2100 05/11/21 0504 05/11/21 0750  BP:  129/76 133/76 121/89  Pulse:  86 75 88  Resp:  18 20 18   Temp:  99.1 F (37.3 C) 98.6 F (37 C) (!) 100.5 F (38.1 C)  TempSrc:  Oral Oral   SpO2: 94% 97% 96% 90%  Weight:      Height:        Examination: GENERAL: No apparent distress.  Nontoxic. HEENT: MMM.  Vision and hearing grossly intact.  NECK: Supple.  No apparent JVD.  RESP:  No IWOB.  Fair aeration bilaterally. CVS:  RRR. Heart sounds normal.  ABD/GI/GU: BS+.  Abd soft, NTND.  MSK/EXT:  Moves extremities.  Dressing over RLE DCI. SKIN: Dressing over RLE DCI. NEURO: Awake and alert. Oriented appropriately.  No apparent focal neuro deficit. PSYCH: Anxious about the fever  Procedures:  ORIF of right bimalleolar fracture by Dr. Zachery Dakins on 05/10/2021   Microbiology summarized: COVID-19 PCR positive. Influenza PCR negative.  Assessment & Plan: Accidental fall-slipped and fell Right ankle fracture/bimalleolar fracture -S/p ORIF. -Pain control and VTE prophylaxis per surgery -NWB LLE -PT/OT -Bowel regimen  COVID-19 infection-incidental finding.  Vaccinated x2 and booster x3.  As symptomatic. -Continue airborne and contact precaution for 10 days per protocol -No indication for treatment.  Postop fever: Unclear etiology.  Febrile to 100.5 earlier this morning.  No clear source of infection.  Foley out.  No respiratory symptoms.  A little anxious about this -Continue monitoring   History of osteoporosis: Reportedly received Prolia a week prior to admission.  -Outpatient follow-up   Hypertension: Normotensive. -Continue low-dose Coreg. -Continue holding lisinopril.   Chronic COPD: No respiratory symptoms. -Continue albuterol as needed.   History of breast cancer s/p left mastectomy -continue with anastrozole -Outpatient follow-up with oncologist.   hyperlipidemia -Continue with statin   Bipolar disorder/anxiety: Stable. -Continue with hydroxyzine, depakote, Lexapro , gabapentin, and Klonopin   Hand tremors.  History of Parkinson? -Continue Sinemet and primidone   Hypothyroidism -Continue with Synthroid  Chronic  thrombocytopenia: Platelets slightly lower than baseline but is stable. -Continue monitoring  Tobacco use disorder: Reports smoking about 10 to 14 cigarettes a day. -Counseled and encouraged cessation -Nicotine patch  Body mass index is 28.32 kg/m.         DVT prophylaxis:  enoxaparin (LOVENOX) injection 40  mg Start: 05/11/21 0900 SCDs Start: 05/07/21 2238  Code Status: Full code Family Communication: Patient and/or RN. Available if any question.  Level of care: Med-Surg Status is: Inpatient  Remains inpatient appropriate because: Postop care after right ankle fracture   Final disposition: TBD after PT/OT eval    Consultants:  Orthopedic surgery   Sch Meds:  Scheduled Meds:  anastrozole  1 mg Oral Daily   atorvastatin  40 mg Oral Daily   calcium-vitamin D  1 tablet Oral BID   carbidopa-levodopa  1.5 tablet Oral TID   carvedilol  3.125 mg Oral BID WC   Chlorhexidine Gluconate Cloth  6 each Topical Q0600   cholecalciferol  1,000 Units Oral Daily   cholecalciferol  2,000 Units Oral Daily   clonazePAM  0.5 mg Oral QHS   divalproex  250 mg Oral TID   enoxaparin  40 mg Subcutaneous Q24H   escitalopram  20 mg Oral Daily   gabapentin  300 mg Oral BID   levothyroxine  125 mcg Oral Q0600   molnupiravir EUA  4 capsule Oral BID   multivitamin with minerals  1 tablet Oral q morning   mupirocin ointment  1 application Nasal BID   nicotine  14 mg Transdermal Daily   oxybutynin  5 mg Oral Daily   pantoprazole  40 mg Oral Daily   primidone  100 mg Oral BID   Continuous Infusions:   PRN Meds:.acetaminophen **OR** acetaminophen, albuterol, diphenhydrAMINE-zinc acetate, hydrOXYzine, morphine injection, oxyCODONE  Antimicrobials: Anti-infectives (From admission, onward)    Start     Dose/Rate Route Frequency Ordered Stop   05/10/21 1400  ceFAZolin (ANCEF) IVPB 2g/100 mL premix        2 g 200 mL/hr over 30 Minutes Intravenous Every 8 hours 05/10/21 1155 05/10/21 2156   05/10/21 1007  vancomycin (VANCOCIN) powder  Status:  Discontinued          As needed 05/10/21 1007 05/10/21 1058   05/09/21 0700  vancomycin (VANCOCIN) IVPB 1000 mg/200 mL premix  Status:  Discontinued        1,000 mg 200 mL/hr over 60 Minutes Intravenous On call 05/08/21 1315 05/10/21 0700   05/09/21 0600   ceFAZolin (ANCEF) IVPB 2g/100 mL premix  Status:  Discontinued        2 g 200 mL/hr over 30 Minutes Intravenous On call to O.R. 05/08/21 1314 05/10/21 0559   05/07/21 2345  molnupiravir EUA (LAGEVRIO) capsule 800 mg        4 capsule Oral 2 times daily 05/07/21 2124 05/12/21 2159   05/07/21 2200  nirmatrelvir/ritonavir EUA (PAXLOVID) 3 tablet  Status:  Discontinued        3 tablet Oral 2 times daily 05/07/21 2110 05/07/21 2119        I have personally reviewed the following labs and images: CBC: Recent Labs  Lab 05/07/21 1403 05/08/21 0301 05/09/21 0558 05/10/21 0303 05/11/21 0335  WBC 5.0 5.6 6.4 6.5 8.6  NEUTROABS 1.8  --   --   --   --   HGB 14.2 12.4 12.6 12.7 12.8  HCT 42.4 37.6 38.6 37.6 38.9  MCV 95.3 92.6 92.3 92.2 92.6  PLT 130*  116* 115* 120* 139*   BMP &GFR Recent Labs  Lab 05/07/21 1403 05/08/21 0301 05/09/21 0558 05/11/21 0335  NA 140 138 140 139  K 4.8 4.3 4.9 4.7  CL 104 106 104 106  CO2 26 25 27 26   GLUCOSE 103* 172* 138* 154*  BUN 17 15 17 15   CREATININE 0.72 0.61 0.67 0.75  CALCIUM 8.6* 8.3* 7.8* 8.8*  MG  --   --  2.1 1.7  PHOS  --   --  3.6 4.5   Estimated Creatinine Clearance: 64.8 mL/min (by C-G formula based on SCr of 0.75 mg/dL). Liver & Pancreas: Recent Labs  Lab 05/09/21 0558 05/11/21 0335  ALBUMIN 3.4* 3.3*   No results for input(s): LIPASE, AMYLASE in the last 168 hours. No results for input(s): AMMONIA in the last 168 hours. Diabetic: No results for input(s): HGBA1C in the last 72 hours. No results for input(s): GLUCAP in the last 168 hours. Cardiac Enzymes: No results for input(s): CKTOTAL, CKMB, CKMBINDEX, TROPONINI in the last 168 hours. No results for input(s): PROBNP in the last 8760 hours. Coagulation Profile: No results for input(s): INR, PROTIME in the last 168 hours. Thyroid Function Tests: No results for input(s): TSH, T4TOTAL, FREET4, T3FREE, THYROIDAB in the last 72 hours. Lipid Profile: No results for  input(s): CHOL, HDL, LDLCALC, TRIG, CHOLHDL, LDLDIRECT in the last 72 hours. Anemia Panel: No results for input(s): VITAMINB12, FOLATE, FERRITIN, TIBC, IRON, RETICCTPCT in the last 72 hours. Urine analysis:    Component Value Date/Time   COLORURINE YELLOW 03/24/2018 0530   APPEARANCEUR CLEAR 03/24/2018 0530   LABSPEC 1.013 03/24/2018 0530   PHURINE 6.0 03/24/2018 0530   GLUCOSEU NEGATIVE 03/24/2018 0530   HGBUR SMALL (A) 03/24/2018 0530   BILIRUBINUR small (A) 02/14/2021 1450   KETONESUR negative 02/14/2021 1450   KETONESUR NEGATIVE 03/24/2018 0530   PROTEINUR >=300 (A) 02/14/2021 1450   PROTEINUR NEGATIVE 03/24/2018 0530   UROBILINOGEN 0.2 02/14/2021 1450   NITRITE Positive (A) 02/14/2021 1450   NITRITE NEGATIVE 03/24/2018 0530   LEUKOCYTESUR Small (1+) (A) 02/14/2021 1450   Sepsis Labs: Invalid input(s): PROCALCITONIN, [place]  Microbiology: Recent Results (from the past 240 hour(s))  Resp Panel by RT-PCR (Flu A&B, Covid) Nasopharyngeal Swab     Status: Abnormal   Collection Time: 05/07/21  5:04 PM   Specimen: Nasopharyngeal Swab; Nasopharyngeal(NP) swabs in vial transport medium  Result Value Ref Range Status   SARS Coronavirus 2 by RT PCR POSITIVE (A) NEGATIVE Final    Comment: (NOTE) SARS-CoV-2 target nucleic acids are DETECTED.  The SARS-CoV-2 RNA is generally detectable in upper respiratory specimens during the acute phase of infection. Positive results are indicative of the presence of the identified virus, but do not rule out bacterial infection or co-infection with other pathogens not detected by the test. Clinical correlation with patient history and other diagnostic information is necessary to determine patient infection status. The expected result is Negative.  Fact Sheet for Patients: EntrepreneurPulse.com.au  Fact Sheet for Healthcare Providers: IncredibleEmployment.be  This test is not yet approved or cleared by  the [place] FDA and  has been authorized for detection and/or diagnosis of SARS-CoV-2 by FDA under an Emergency Use Authorization (EUA).  This EUA will remain in effect (meaning this test can be used) for the duration of  the COVID-19 declaration under Section 564(b)(1) of the A ct, 21 U.S.C. section 360bbb-3(b)(1), unless the authorization is terminated or revoked sooner.     Influenza A by PCR NEGATIVE  NEGATIVE Final   Influenza B by PCR NEGATIVE NEGATIVE Final    Comment: (NOTE) The Xpert Xpress SARS-CoV-2/FLU/RSV plus assay is intended as an aid in the diagnosis of influenza from Nasopharyngeal swab specimens and should not be used as a sole basis for treatment. Nasal washings and aspirates are unacceptable for Xpert Xpress SARS-CoV-2/FLU/RSV testing.  Fact Sheet for Patients: EntrepreneurPulse.com.au  Fact Sheet for Healthcare Providers: IncredibleEmployment.be  This test is not yet approved or cleared by the [place] FDA and has been authorized for detection and/or diagnosis of SARS-CoV-2 by FDA under an Emergency Use Authorization (EUA). This EUA will remain in effect (meaning this test can be used) for the duration of the COVID-19 declaration under Section 564(b)(1) of the Act, 21 U.S.C. section 360bbb-3(b)(1), unless the authorization is terminated or revoked.  Performed at [facility], [address], [place], [place] [address]   Surgical pcr screen     Status: Abnormal   Collection Time: 05/07/21 11:45 PM   Specimen: Nasal Mucosa; Nasal Swab  Result Value Ref Range Status   MRSA, PCR POSITIVE (A) NEGATIVE Final    Comment: RESULT CALLED TO, READ BACK BY AND VERIFIED WITH: J ATANBILA,RN@0440  05/08/21 [place]    Staphylococcus aureus POSITIVE (A) NEGATIVE Final    Comment: (NOTE) The Xpert SA Assay (FDA approved for NASAL specimens in patients 43 years of age and older), is one component of a comprehensive surveillance  program. It is not intended to diagnose infection nor to guide or monitor treatment. Performed at [facility], [place] [address], [place], [place] [address]     Radiology Studies: DG Ankle Right Port  Result Date: 05/11/2021 CLINICAL DATA:  Status post right ankle fracture ORIF. EXAM: PORTABLE RIGHT ANKLE - 2 VIEW COMPARISON:  05/10/2021 and 05/07/2021. FINDINGS: Lateral fixation plate reduces the distal fibular fracture into anatomic alignment. Two screws reduce the medial malleolar fracture, also into anatomic alignment. Ankle mortise is normally aligned. Orthopedic hardware is well seated. Ankle encased in a fiberglass cast/splint. IMPRESSION: 1. Well-aligned distal fibula and medial malleolar fractures following ORIF. Normally aligned ankle joint. Electronically Signed   By: Lajean Manes M.D.   On: 05/11/2021 10:30       Larita Deremer T. [place]  If 7PM-7AM, please contact night-coverage [email or web address] 05/11/2021, 2:11 PM

## 2021-05-11 NOTE — Progress Notes (Signed)
° ° °  Subjective:  COVID + patient   Patient reports pain as moderate. Currently requesting more medicine. Tolerating diet. Urinating. No CP, SOB.    Objective:   VITALS:   Vitals:   05/10/21 1449 05/10/21 2100 05/11/21 0504 05/11/21 0750  BP:  129/76 133/76 121/89  Pulse:  86 75 88  Resp:  18 20 18   Temp:  99.1 F (37.3 C) 98.6 F (37 C) (!) 100.5 F (38.1 C)  TempSrc:  Oral Oral   SpO2: 94% 97% 96% 90%  Weight:      Height:       CBC Latest Ref Rng & Units 05/11/2021 05/10/2021 05/09/2021  WBC 4.0 - 10.5 K/uL 8.6 6.5 6.4  Hemoglobin 12.0 - 15.0 g/dL 12.8 12.7 12.6  Hematocrit 36.0 - 46.0 % 38.9 37.6 38.6  Platelets 150 - 400 K/uL 139(L) 120(L) 115(L)   BMP Latest Ref Rng & Units 05/11/2021 05/09/2021 05/08/2021  Glucose 70 - 99 mg/dL 154(H) 138(H) 172(H)  BUN 8 - 23 mg/dL 15 17 15   Creatinine 0.44 - 1.00 mg/dL 0.75 0.67 0.61  BUN/Creat Ratio 6 - 22 (calc) - - -  Sodium 135 - 145 mmol/L 139 140 138  Potassium 3.5 - 5.1 mmol/L 4.7 4.9 4.3  Chloride 98 - 111 mmol/L 106 104 106  CO2 22 - 32 mmol/L 26 27 25   Calcium 8.9 - 10.3 mg/dL 8.8(L) 7.8(L) 8.3(L)   Intake/Output      01/07 0701 01/08 0700 01/08 0701 01/09 0700   P.O.  240   I.V. (mL/kg) 800 (10.7)    IV Piggyback 150    Total Intake(mL/kg) 950 (12.7) 240 (3.2)   Urine (mL/kg/hr) 0 (0)    Blood 20    Total Output 20    Net +930 +240        Urine Occurrence 1 x       Physical Exam: General: NAD.  Sitting up in bed, calm, talkative Resp: No increased wob Cardio: regular rate and rhythm ABD soft Neurologically intact MSK Neurovascularly intact Sensation intact distally Intact pulses distally Dorsiflexion/Plantar flexion intact Incision: dressing C/D/I   Assessment: 1 Day Post-Op  S/P Procedure(s) (LRB): OPEN REDUCTION INTERNAL FIXATION (ORIF) ANKLE FRACTURE (Right) by Dr. Zachery Dakins on 05/10/21  Principal Problem:   Ankle fracture Active Problems:   Bipolar 1 disorder (HCC)   COPD (chronic  obstructive pulmonary disease) (HCC)   Breast cancer (HCC)   GERD (gastroesophageal reflux disease)    Plan: Manage pain control better  Advance diet Up with therapy Incentive Spirometry Elevate and Apply ice  Weightbearing: NWB RLE Insicional and dressing care: Dressings left intact until follow-up and Reinforce dressings as needed Orthopedic device(s): Splint Showering: Keep dressing dry VTE prophylaxis: Lovenox 40mg  qd  while inpatient , SCDs, ambulation Pain control: Gabapentin, Tylenol, Morphine, Oxy PRN Follow - up plan: 2 weeks post op with Dr. Louann Liv information for today:  Edmonia Lynch MD, Aggie Moats PA-C  Dispo:  TBD based on PT evals.     Britt Bottom, PA-C Office [phone] 05/11/2021, 12:57 PM

## 2021-05-11 NOTE — Evaluation (Signed)
Physical Therapy Evaluation Patient Details Name: Laurie Moreno MRN: [id number] DOB: 1951/04/08 Today's Date: 05/11/2021  History of Present Illness  Curtis Uriarte  is a 71 y.o. female who patient presents to ED secondary to complaints of right ankle pain and deformity after a fall, patient reports she was visiting her doctor's office, when it was raining, and she slipped due to rain, and fell backward, and struck the back of the head and twisted her right ankle, she had immediate ankle pain; found to have bimalleolar fx, now s/p ORIF, NWB RLE; with past medical history of bipolar disorder, tremors, CHF, COPD, breast cancer  Clinical Impression   Patient is s/p above surgery resulting in functional limitations due to the deficits listed below (see PT Problem List). Comes from home where pt lives with her sister and brother-in-law in a single level home with a ramped entrance; modified independent at baseline, typically does not use RW; Presents to PT with R LE and ankle pain, functional dependencies, and O2 sats on the low side on room air at 90-92%;  Patient will benefit from skilled PT to increase their independence and safety with mobility to allow discharge to the venue listed below.          Recommendations for follow up therapy are one component of a multi-disciplinary discharge planning process, led by the attending physician.  Recommendations may be updated based on patient status, additional functional criteria and insurance authorization.  Follow Up Recommendations Home health PT    Assistance Recommended at Discharge Intermittent Supervision/Assistance  Patient can return home with the following  A little help with walking and/or transfers;A little help with bathing/dressing/bathroom;Assistance with cooking/housework;Assist for transportation;Help with stairs or ramp for entrance    Equipment Recommendations Rolling walker (2 wheels);BSC/3in1;Wheelchair (measurements PT);Wheelchair  cushion (measurements PT) (Consider drop-arm)  Recommendations for Other Services       Functional Status Assessment Patient has had a recent decline in their functional status and demonstrates the ability to make significant improvements in function in a reasonable and predictable amount of time.     Precautions / Restrictions Precautions Precautions: Fall;Other (comment) Precaution Comments: Covid Restrictions RLE Weight Bearing: Non weight bearing      Mobility  Bed Mobility Overal bed mobility: Needs Assistance Bed Mobility: Supine to Sit     Supine to sit: Min assist     General bed mobility comments: min handheld assist to pull to sit    Transfers Overall transfer level: Needs assistance Equipment used: 1 person hand held assist Transfers: Bed to chair/wheelchair/BSC   Stand pivot transfers: Mod assist         General transfer comment: Mod assist to rise and pivot to recliner placed on pt's L side; good maintenance of NWB RLE    Ambulation/Gait                  Stairs            Wheelchair Mobility    Modified Rankin (Stroke Patients Only)       Balance Overall balance assessment: Needs assistance   Sitting balance-Leahy Scale: Good       Standing balance-Leahy Scale: Poor                               Pertinent Vitals/Pain Pain Assessment: Faces Faces Pain Scale: Hurts little more Pain Location: R ankle Pain Descriptors / Indicators: Grimacing;Guarding;Throbbing Pain Intervention(s): Repositioned;Patient requesting pain  meds-RN notified    Home Living Family/patient expects to be discharged to:: Private residence Living Arrangements: Non-relatives/Friends (Sister) Available Help at Discharge: Family Type of Home: Mobile home Home Access: Ramped entrance       [place]: One [place]: None (Pt didn't indicate she had any epuipment)      Prior Function Prior Level of Function :  Independent/Modified Independent             Mobility Comments: No difficulties walking before this episode       Hand Dominance        Extremity/Trunk Assessment   Upper Extremity Assessment Upper Extremity Assessment: Defer to OT evaluation    Lower Extremity Assessment Lower Extremity Assessment: Generalized weakness;RLE deficits/detail RLE Deficits / Details: R ankle immobilized in splint; sensation intact to light touch toes, and noted + active toe wiggle; min assist to perfrom SLR       Communication   Communication: No difficulties  Cognition Arousal/Alertness: Awake/alert Behavior During Therapy: WFL for tasks assessed/performed Overall Cognitive Status: Within Functional Limits for tasks assessed (for simple mobility tasks)                                          General Comments General comments (skin integrity, edema, etc.): Session conducted on room Air,and O2 sats ranged 90-92%    Exercises     Assessment/Plan    PT Assessment Patient needs continued PT services  PT Problem List Decreased strength;Decreased range of motion;Decreased activity tolerance;Decreased balance;Decreased mobility;Decreased coordination;Decreased knowledge of use of DME;Decreased safety awareness;Decreased knowledge of precautions;Cardiopulmonary status limiting activity;Pain       PT Treatment Interventions DME instruction;Gait training;Functional mobility training;Therapeutic activities;Therapeutic exercise;Balance training;Patient/family education;Wheelchair mobility training    PT Goals (Current goals can be found in the Care Plan section)  Acute Rehab PT Goals Patient Stated Goal: Hopes to be able to get home soon PT Goal Formulation: With patient Time For Goal Achievement: 05/25/21 Potential to Achieve Goals: Good Additional Goals Additional Goal #1: Pt will propel (in room distances) and manage parts of wheelchair with min assist    Frequency Min  5X/week     Co-evaluation               AM-PAC PT "6 Clicks" Mobility  Outcome Measure Help needed turning from your back to your side while in a flat bed without using bedrails?: None Help needed moving from lying on your back to sitting on the side of a flat bed without using bedrails?: A Little Help needed moving to and from a bed to a chair (including a wheelchair)?: A Lot Help needed standing up from a chair using your arms (e.g., wheelchair or bedside chair)?: A Lot Help needed to walk in hospital room?: A Lot Help needed climbing 3-5 steps with a railing? : A Lot 6 Click Score: 15    End of Session Equipment Utilized During Treatment: Gait belt Activity Tolerance: Patient tolerated treatment well Patient left: in chair;with call bell/phone within reach;with chair alarm set Nurse Communication: Mobility status PT Visit Diagnosis: Unsteadiness on feet (R26.81);Other abnormalities of gait and mobility (R26.89);Pain Pain - Right/Left: Right Pain - part of body: Ankle and joints of foot    Time: 6720-9470 PT Time Calculation (min) (ACUTE ONLY): 32 min   Charges:   PT Evaluation $PT Eval Moderate Complexity: 1 Mod PT Treatments $Therapeutic  Activity: 8-22 mins        Roney [place], [place]  Acute Rehabilitation Services Pager [phone] Office [phone]   [place]ta [place] 05/11/2021, 5:54 PM

## 2021-05-11 NOTE — Plan of Care (Signed)

## 2021-05-12 ENCOUNTER — Other Ambulatory Visit (HOSPITAL_COMMUNITY): Payer: Self-pay

## 2021-05-12 ENCOUNTER — Ambulatory Visit: Payer: Medicare Other | Admitting: Gastroenterology

## 2021-05-12 ENCOUNTER — Encounter (HOSPITAL_COMMUNITY): Payer: Self-pay | Admitting: Hematology

## 2021-05-12 ENCOUNTER — Encounter (HOSPITAL_COMMUNITY): Payer: Self-pay | Admitting: Orthopedic Surgery

## 2021-05-12 MED ORDER — ENOXAPARIN SODIUM 40 MG/0.4ML IJ SOSY
40.0000 mg | PREFILLED_SYRINGE | INTRAMUSCULAR | 0 refills | Status: DC
Start: 2021-05-12 — End: 2021-10-22
  Filled 2021-05-12: qty 12, 30d supply, fill #0

## 2021-05-12 MED ORDER — SENNOSIDES-DOCUSATE SODIUM 8.6-50 MG PO TABS
1.0000 | ORAL_TABLET | Freq: Two times a day (BID) | ORAL | 0 refills | Status: DC | PRN
Start: 2021-05-12 — End: 2021-10-22

## 2021-05-12 MED ORDER — ACETAMINOPHEN 500 MG PO TABS: ORAL_TABLET | ORAL | Status: AC

## 2021-05-12 MED ORDER — OXYCODONE HCL 5 MG PO TABS
5.0000 mg | ORAL_TABLET | Freq: Three times a day (TID) | ORAL | 0 refills | Status: AC | PRN
  Filled 2021-05-12: qty 21, 7d supply, fill #0

## 2021-05-12 NOTE — TOC Progression Note (Signed)
Transition of Care [facility]) - Progression Note    Patient Details  Name: Laurie Moreno MRN: [id number] Date of Birth: 03-30-1951  [facility] of [facility] CM/SW Contact  Bartholomew Crews, RN Phone Number: [phone] 05/12/2021, 5:26 PM  Clinical Narrative:     Notified by Amy with [facility] that OT is not available in [facility]. Alvis Lemmings does not have OT in [facility] either. [facility] has PT and OT, but are booked at least a week out for start of care. Latricia Heft can start PT immediately which is primary need for patient to work on transfers and ambulation.   Expected Discharge Plan: Home/Self Care Barriers to Discharge: No Barriers Identified  Expected Discharge Plan and Services Expected Discharge Plan: Home/Self Care   Discharge Planning Services: CM Consult   Living arrangements for the past 2 months: Mobile Home (Double wide mobile home) Expected Discharge Date: 05/12/21               DME Arranged: 3-N-1, Wheelchair manual, Walker rolling DME Agency: [employer] Date DME Agency Contacted: 05/12/21 Time DME Agency Contacted: [id number] Representative spoke with at DME Agency: Freda Munro HH Arranged: PT, OT [facility] Agency: [place] Date [place]: 05/12/21 Time [place]: 1316 Representative spoke with at [place]: Amy   Social Determinants of Health ([place]) Interventions    Readmission Risk Interventions No flowsheet data found.

## 2021-05-12 NOTE — Progress Notes (Signed)
° ° ° °  Subjective:  Patient is doing very well. Pain is well controlled. Denies n/t.  Objective:   VITALS:   Vitals:   05/11/21 0504 05/11/21 0750 05/11/21 2017 05/12/21 0625  BP: 133/76 121/89 103/70 110/68  Pulse: 75 88 96 80  Resp: 20 18 18 16   Temp: 98.6 F (37 C) (!) 100.5 F (38.1 C) 99.6 F (37.6 C) 98.8 F (37.1 C)  TempSrc: Oral     SpO2: 96% 90% 91% 91%  Weight:      Height:        Sensation intact distally in exposed toes Intact pulses distally Dorsiflexion/Plantar flexion of exposed toes is intact Incision: dressing C/D/I Compartment soft   Lab Results  Component Value Date   WBC 8.6 05/11/2021   HGB 12.8 05/11/2021   HCT 38.9 05/11/2021   MCV 92.6 05/11/2021   PLT 139 (L) 05/11/2021   BMET    Component Value Date/Time   NA 139 05/11/2021 0335   K 4.7 05/11/2021 0335   CL 106 05/11/2021 0335   CO2 26 05/11/2021 0335   GLUCOSE 154 (H) 05/11/2021 0335   BUN 15 05/11/2021 0335   CREATININE 0.75 05/11/2021 0335   CREATININE 0.79 12/23/2018 1228   CALCIUM 8.8 (L) 05/11/2021 0335   GFRNONAA >60 05/11/2021 0335     Assessment/Plan: 2 Days Post-Op   Principal Problem:   Ankle fracture Active Problems:   Bipolar 1 disorder (HCC)   COPD (chronic obstructive pulmonary disease) (HCC)   Breast cancer (HCC)   GERD (gastroesophageal reflux disease)  R ankle fx ORIF 1/7  Post op recs: WB: NWB LLE Imaging: PACU xrays Dressing: keep splint intact until follow up DVT prophylaxis: Lovenox daily for 4 weeks starting postop day 1 Follow up: 2 weeks after surgery for a wound check and suture removal with Dr. Zachery Dakins at [facility].  Address: [address] [place], [place], [place] [address]  Office Phone: [phone]    Willaim Sheng 05/12/2021, 7:09 AM   Charlies Constable, MD  Contact information:   [phone] 7am-5pm epic message Dr. Zachery Dakins, or call office for patient follow up: [phone] After hours and  holidays please check [email or web address] for group call information for [facility]

## 2021-05-12 NOTE — Plan of Care (Signed)

## 2021-05-12 NOTE — TOC Transition Note (Addendum)
Transition of Care [facility]) - CM/SW Discharge Note   Patient Details  Name: Laurie Moreno MRN: [id number] Date of Birth: 1951-02-09  [facility] of [facility]) CM/SW Contact:  Laurie Crews, RN Phone Number: [phone] 05/12/2021, 10:22 AM   Clinical Narrative:     Spoke with patient on hospital room phone to discuss post acute transition today. PTA home with sister and BIL. Previously independent with no DME or active HH. Discussed needed DME and [place] - patient agreeable. Offered choice - no preferences. Referral to [facility] for wheelhair, RW, and 3/1. Referral pending acceptance for PT and OT - HH orders already in place. Patient to call her sister to pick her up and receive training from nursing about lovenox administration. No further TOC needs identified at this time.   Update 10:55 AM: Received call from patient's sister, Laurie Moreno, to discuss discharge questions. Advised of DME needs and pending [place] arrangements. Discussed lovenox teaching. Laurie Moreno stated that she self administered lovenox in 2010 after hip sx - advised that nursing will review with her. TOC to fill discharge medications. Laurie Moreno requests that nursing call her when patient is ready stating that she is about 45 minutes away.   Update 1:15 PM: Referral for [facility] PT OT accepted by [facility].   Final next level of care: Home w Home Health Services Barriers to Discharge: No Barriers Identified   Patient Goals and CMS Choice Patient states their goals for this hospitalization and ongoing recovery are:: return home with sister CMS Medicare.gov Compare Post Acute Care list provided to:: Patient Choice offered to / list presented to : Patient  Discharge Placement                       Discharge Plan and Services   Discharge Planning Services: CM Consult            DME Arranged: 3-N-1, Wheelchair manual, Walker rolling DME Agency: [facility] Date DME Agency Contacted: 05/12/21 Time DME Agency Contacted:  1115 Representative spoke with at DME Agency: Laurie Moreno HH Arranged: PT, OT          Social Determinants of Health ([place]) Interventions     Readmission Risk Interventions No flowsheet data found.

## 2021-05-12 NOTE — Progress Notes (Signed)
°  °  Durable Medical Equipment  (From admission, onward)           Start     Ordered   05/12/21 1015  For home use only DME 3 n 1  Once       Comments: Drop arm   05/12/21 1014   05/12/21 1012  For home use only DME lightweight manual wheelchair with seat cushion  Once       Comments: Patient suffers from ankle fracture which impairs their ability to perform daily activities like bathing and toileting in the home.  A walker will not resolve  issue with performing activities of daily living. A wheelchair will allow patient to safely perform daily activities. Patient is not able to propel themselves in the home using a standard weight wheelchair due to general weakness. Patient can self propel in the lightweight wheelchair. Length of need 6 months . Accessories: elevating leg rests (ELRs), wheel locks, extensions and anti-tippers.   05/12/21 1011   05/12/21 1011  For home use only DME Walker rolling  Once       Question Answer Comment  Walker: With 5 Inch Wheels   Patient needs a walker to treat with the following condition Ankle fracture      05/12/21 1011

## 2021-05-12 NOTE — Progress Notes (Signed)
Physical Therapy Treatment Patient Details Name: Laurie Moreno MRN: [id number] DOB: Jun 14, 1950 Today's Date: 05/12/2021   History of Present Illness Laurie Moreno  is a 71 y.o. female who patient presents to ED secondary to complaints of right ankle pain and deformity after a fall, patient reports she was visiting her doctor's office, when it was raining, and she slipped due to rain, and fell backward, and struck the back of the head and twisted her right ankle, she had immediate ankle pain; found to have bimalleolar fx, now s/p ORIF, NWB RLE; with past medical history of bipolar disorder, tremors, CHF, COPD, breast cancer    PT Comments    Continuing work on functional mobility and activity tolerance;  Session focused on transfers OOB, wheelchair transfers, and use of drop-arm with BSC transfers; initially used RW for stand pivot transfers and preliminary hop-step style gait training as a method of transfer, but pt needed at least mod assist for safety, and we opted for more stable lateral scoot transfers; Pt will need more training and education for safe wheelchair parts management -- also highly recommend training pt's sister and brother-in-law in wheelchair mgmnt and use of drop-arm BSC; Plan to add sliding board use for lateral scoot transfers next session as well;   Lengthy discussion re: getting home; [name] currently needs assist with transfers and safe wheelchair parts and mobility management; I highly recommend teaching Chava's family about wheelchair and drop-arm [facility] management as well; Requesting another day inpatient for more PT and OT here acutely, and involving the people who are helping her at home; Notified Care Team of my concerns via secure chat;   Ramandeep very much wants to go home, and she is disappointed to not go home today; [name] told me she and her sister decided that if after a few days it was too hard to manage, that they would get her to a rehab center -- While this shows that  they are realistic about challenges managing at home -- however, it is difficult to get from home straight to a rehab center; If pt and family think they cannot meet her needs at home, then we should consider getting to SNF for rehab from this acute hosptial stay.  Recommendations for follow up therapy are one component of a multi-disciplinary discharge planning process, led by the attending physician.  Recommendations may be updated based on patient status, additional functional criteria and insurance authorization.  Follow Up Recommendations  Home health PT     Assistance Recommended at Discharge Intermittent Supervision/Assistance  Patient can return home with the following A lot of help with walking and/or transfers;Assist for transportation;Help with stairs or ramp for entrance (Assist initially with wheelchair management)   Equipment Recommendations  Rolling walker (2 wheels);BSC/3in1;Wheelchair (measurements PT);Wheelchair cushion (measurements PT) (Consider drop-arm); Sliding board  Wheelchair, and drop-arm BSC were delivered to room and pivotal for practicing with them for this session (thank you!); will also request sliding board for lateral scooting transfers   Recommendations for Other Services       Precautions / Restrictions Precautions Precautions: Fall;Other (comment) Precaution Comments: Covid Restrictions RLE Weight Bearing: Non weight bearing     Mobility  Bed Mobility                    Transfers Overall transfer level: Needs assistance Equipment used: Rolling walker (2 wheels) Transfers: Sit to/from Stand;Bed to chair/wheelchair/BSC Sit to Stand: Min assist Stand pivot transfers: Mod assist  Lateral/Scoot Transfers: Min assist (mostly to steady BSC) General transfer comment: Min assist for steadying and cues to anterior weight shift; loss balance backwards at initial stand, resulting in sitting back down without control or warning; Mod assist  for stand pivot transfer and attempts at hopping with RW, decr RW height to allow for pt to put more of her bodyweight on RW through her UEs; Needed mod assist for RW management, and close guard for safety due to incr fall risk; still very aware and good effort to maintain NWB; Practiced lateral scoot transfers Recliner to/from wheelchair, and wheelchair to/from drop arm BSC; Needs min assist and close guard for safety, as well as supervision and cues to make sure equipment setup is safe and stable    Ambulation/Gait Ambulation/Gait assistance: Mod assist Gait Distance (Feet): 2 Feet Assistive device: Rolling walker (2 wheels) Gait Pattern/deviations:  (hop-step)       General Gait Details: Difficulty getting body weight smoothly through UEs onto RW for more efficient stepping; able to hop, but it was quite energetically taxing, even for short distance   Stairs             Wheelchair Mobility    Modified Rankin (Stroke Patients Only)       Balance     Sitting balance-Leahy Scale: Good       Standing balance-Leahy Scale: Poor                              Cognition Arousal/Alertness: Awake/alert Behavior During Therapy: WFL for tasks assessed/performed Overall Cognitive Status: Within Functional Limits for tasks assessed (for simple mobility tasks)                                 General Comments: Noting pt can get overwhelmed with all the steps and mechanisms involved with wheelchair parts management and use of drop-arm BSC; will need reinforcement and recommend involving family in wheelchair training, DME training        Exercises      General Comments General comments (skin integrity, edema, etc.): Lengthy conversation re: dc planning and her current level of assist needed; pt is torn -- she very much wants to dc home, but she and her sister are worried about managing at home as well      Pertinent Vitals/Pain Pain Assessment:  0-10 Pain Score: 8  Pain Location: R ankle; incr pain by the end of session Pain Descriptors / Indicators: Grimacing Pain Intervention(s): Monitored during session;Other (comment) (Elevated extremity)    Home Living                          Prior Function            PT Goals (current goals can now be found in the care plan section) Acute Rehab PT Goals Patient Stated Goal: Hopes to be able to get home soon PT Goal Formulation: With patient Time For Goal Achievement: 05/25/21 Potential to Achieve Goals: Good Progress towards PT goals: Progressing toward goals (Slowly)    Frequency    Min 5X/week      PT Plan Current plan remains appropriate    Co-evaluation              AM-PAC PT "6 Clicks" Mobility   Outcome Measure  Help needed turning from your back to your  side while in a flat bed without using bedrails?: None Help needed moving from lying on your back to sitting on the side of a flat bed without using bedrails?: A Little Help needed moving to and from a bed to a chair (including a wheelchair)?: A Lot Help needed standing up from a chair using your arms (e.g., wheelchair or bedside chair)?: A Lot Help needed to walk in hospital room?: A Lot Help needed climbing 3-5 steps with a railing? : A Lot 6 Click Score: 15    End of Session Equipment Utilized During Treatment: Gait belt Activity Tolerance: Patient tolerated treatment well Patient left: in chair;with call bell/phone within reach;with chair alarm set Nurse Communication: Mobility status;Other (comment) (and requesting another day inpt for PT/OT training) PT Visit Diagnosis: Unsteadiness on feet (R26.81);Other abnormalities of gait and mobility (R26.89);Pain Pain - Right/Left: Right Pain - part of body: Ankle and joints of foot     Time: 1300-1359 PT Time Calculation (min) (ACUTE ONLY): 59 min  Charges:  $Therapeutic Activity: 23-37 mins $Self Care/Home Management: 8-22 $Wheel Chair  Management: 8-22 mins                     Roney Marion, [place]  Acute Rehabilitation Services Pager [phone] Office [place] 05/12/2021, 4:28 PM

## 2021-05-12 NOTE — Progress Notes (Signed)
PROGRESS NOTE  Laurie Moreno BLT:[id number] DOB: 1950/12/03   PCP: Derek Jack, MD  Patient is from: Home.  Lives with sister.  Independently ambulates at baseline.  Does not drive.  DOA: 05/07/2021 LOS: 5  Chief complaints:  Chief Complaint  Patient presents with   Fall   Ankle Pain     Brief Narrative / Interim history: 71 year old F with PMH of diastolic CHF, bipolar disorder, hand tremor, COPD, tobacco use disorder and left breast cancer status postmastectomy presenting to [facility], ED with right ankle pain and deformity after she had an accidental fall on the way to her psychiatrist office. She slipped due to rain, and fell backward, and struck the back of the head and twisted her right ankle, she had immediate ankle pain, unable to bear weight, she denies any loss of consciousness.  X-ray showed bimalleolar right ankle fracture.  She also tested positive for COVID-19.  She is not symptomatic from COVID infection.  She is vaccinated and boosted.  Transferred to [facility] due to lack of [place] room at [facility].   Patient underwent ORIF by Dr. Zachery Dakins on 05/10/2021.  Plan for discharge home on 1/10 with [facility] and DME.   Subjective: Seen and examined earlier this morning.  No major events overnight of this morning.  No complaints.  Pain fairly controlled.  She denies chest pain, dyspnea, cough, GI or UTI symptoms.  Very eager to go home.  Objective: Vitals:   05/11/21 0750 05/11/21 2017 05/12/21 0625 05/12/21 0813  BP: 121/89 103/70 110/68 111/78  Pulse: 88 96 80 99  Resp: 18 18 16 18   Temp: (!) 100.5 F (38.1 C) 99.6 F (37.6 C) 98.8 F (37.1 C) 97.8 F (36.6 C)  TempSrc:      SpO2: 90% 91% 91% 91%  Weight:      Height:        Examination:  GENERAL: No apparent distress.  Sitting on bedside chair. HEENT: MMM.  Vision and hearing grossly intact.  NECK: Supple.  No apparent JVD.  RESP: 91% on RA.  No IWOB.  Fair aeration bilaterally. CVS:  RRR. Heart sounds normal.   ABD/GI/GU: BS+. Abd soft, NTND.  MSK/EXT:  Moves extremities.  Dressing over RLE DCI. SKIN: Dressing over RLE DCI. NEURO: Awake and alert. Oriented appropriately.  No apparent focal neuro deficit. PSYCH: Calm. Normal affect.   Procedures:  ORIF of right bimalleolar fracture by Dr. Zachery Dakins on 05/10/2021   Microbiology summarized: COVID-19 PCR positive. Influenza PCR negative.  Assessment & Plan: Accidental fall-slipped and fell Right ankle fracture/bimalleolar fracture -S/p ORIF. -Pain control and VTE prophylaxis per surgery -NWB LLE -PT/OT-suggests 1 more day of therapy in-house before discharge home with home health and DME. -Bowel regimen -Outpatient follow-up with orthopedic surgery in 2 weeks  COVID-19 infection-incidental finding.  Vaccinated x2 and booster x3.  As symptomatic. -Continue airborne and contact precaution for 10 days per protocol -No indication for treatment.  Postop fever: Unclear etiology.  Febrile to 100.5 earlier this morning.  No clear source of infection.  Foley out.  No respiratory symptoms.  A little anxious about this -Continue monitoring   History of osteoporosis: Reportedly received Prolia a week prior to admission.  -Outpatient follow-up   Hypertension: Normotensive. -Continue low-dose Coreg. -Continue holding lisinopril.   Chronic COPD: No respiratory symptoms. -Continue albuterol as needed.   History of breast cancer s/p left mastectomy -continue with anastrozole -Outpatient follow-up with oncologist.   hyperlipidemia -Continue with statin   Bipolar  disorder/anxiety: Stable. -Continue with hydroxyzine, depakote, Lexapro , gabapentin, and Klonopin   Hand tremors.  History of Parkinson? -Continue Sinemet and primidone   Hypothyroidism -Continue with Synthroid  Chronic thrombocytopenia: Platelets slightly lower than baseline but is stable. -Continue monitoring  Tobacco use disorder: Reports smoking about 10 to 14 cigarettes a  day. -Counseled and encouraged cessation -Nicotine patch  Body mass index is 28.32 kg/m.         DVT prophylaxis:  enoxaparin (LOVENOX) injection 40 mg Start: 05/11/21 0900 SCDs Start: 05/07/21 2238  Code Status: Full code Family Communication: Patient and/or RN. Available if any question.  Level of care: Med-Surg Status is: Inpatient  Remains inpatient appropriate because: Postop care after right ankle fracture   Final disposition: Home with home health and DME on 1/10.    Consultants:  Orthopedic surgery   Sch Meds:  Scheduled Meds:  anastrozole  1 mg Oral Daily   atorvastatin  40 mg Oral Daily   calcium-vitamin D  1 tablet Oral BID   carbidopa-levodopa  1.5 tablet Oral TID   carvedilol  3.125 mg Oral BID WC   Chlorhexidine Gluconate Cloth  6 each Topical Q0600   cholecalciferol  1,000 Units Oral Daily   cholecalciferol  2,000 Units Oral Daily   clonazePAM  0.5 mg Oral QHS   divalproex  250 mg Oral TID   enoxaparin  40 mg Subcutaneous Q24H   escitalopram  20 mg Oral Daily   gabapentin  300 mg Oral BID   levothyroxine  125 mcg Oral Q0600   multivitamin with minerals  1 tablet Oral q morning   mupirocin ointment  1 application Nasal BID   nicotine  14 mg Transdermal Daily   oxybutynin  5 mg Oral Daily   pantoprazole  40 mg Oral Daily   primidone  100 mg Oral BID   Continuous Infusions:   PRN Meds:.acetaminophen **OR** acetaminophen, albuterol, diphenhydrAMINE-zinc acetate, hydrOXYzine, morphine injection, oxyCODONE, [COMPLETED] polyethylene glycol **FOLLOWED BY** polyethylene glycol, [COMPLETED] senna-docusate **FOLLOWED BY** senna-docusate  Antimicrobials: Anti-infectives (From admission, onward)    Start     Dose/Rate Route Frequency Ordered Stop   05/10/21 1400  ceFAZolin (ANCEF) IVPB 2g/100 mL premix        2 g 200 mL/hr over 30 Minutes Intravenous Every 8 hours 05/10/21 1155 05/10/21 2156   05/10/21 1007  vancomycin (VANCOCIN) powder  Status:   Discontinued          As needed 05/10/21 1007 05/10/21 1058   05/09/21 0700  vancomycin (VANCOCIN) IVPB 1000 mg/200 mL premix  Status:  Discontinued        1,000 mg 200 mL/hr over 60 Minutes Intravenous On call 05/08/21 1315 05/10/21 0700   05/09/21 0600  ceFAZolin (ANCEF) IVPB 2g/100 mL premix  Status:  Discontinued        2 g 200 mL/hr over 30 Minutes Intravenous On call to O.R. 05/08/21 1314 05/10/21 0559   05/07/21 2345  molnupiravir EUA (LAGEVRIO) capsule 800 mg        4 capsule Oral 2 times daily 05/07/21 2124 05/12/21 1216   05/07/21 2200  nirmatrelvir/ritonavir EUA (PAXLOVID) 3 tablet  Status:  Discontinued        3 tablet Oral 2 times daily 05/07/21 2110 05/07/21 2119        I have personally reviewed the following labs and images: CBC: Recent Labs  Lab 05/07/21 1403 05/08/21 0301 05/09/21 0558 05/10/21 0303 05/11/21 0335  WBC 5.0 5.6 6.4 6.5 8.6  NEUTROABS 1.8  --   --   --   --   HGB 14.2 12.4 12.6 12.7 12.8  HCT 42.4 37.6 38.6 37.6 38.9  MCV 95.3 92.6 92.3 92.2 92.6  PLT 130* 116* 115* 120* 139*   BMP &GFR Recent Labs  Lab 05/07/21 1403 05/08/21 0301 05/09/21 0558 05/11/21 0335  NA 140 138 140 139  K 4.8 4.3 4.9 4.7  CL 104 106 104 106  CO2 26 25 27 26   GLUCOSE 103* 172* 138* 154*  BUN 17 15 17 15   CREATININE 0.72 0.61 0.67 0.75  CALCIUM 8.6* 8.3* 7.8* 8.8*  MG  --   --  2.1 1.7  PHOS  --   --  3.6 4.5   Estimated Creatinine Clearance: 64.8 mL/min (by C-G formula based on SCr of 0.75 mg/dL). Liver & Pancreas: Recent Labs  Lab 05/09/21 0558 05/11/21 0335  ALBUMIN 3.4* 3.3*   No results for input(s): LIPASE, AMYLASE in the last 168 hours. No results for input(s): AMMONIA in the last 168 hours. Diabetic: No results for input(s): HGBA1C in the last 72 hours. No results for input(s): GLUCAP in the last 168 hours. Cardiac Enzymes: No results for input(s): CKTOTAL, CKMB, CKMBINDEX, TROPONINI in the last 168 hours. No results for input(s): PROBNP  in the last 8760 hours. Coagulation Profile: No results for input(s): INR, PROTIME in the last 168 hours. Thyroid Function Tests: No results for input(s): TSH, T4TOTAL, FREET4, T3FREE, THYROIDAB in the last 72 hours. Lipid Profile: No results for input(s): CHOL, HDL, LDLCALC, TRIG, CHOLHDL, LDLDIRECT in the last 72 hours. Anemia Panel: No results for input(s): VITAMINB12, FOLATE, FERRITIN, TIBC, IRON, RETICCTPCT in the last 72 hours. Urine analysis:    Component Value Date/Time   COLORURINE YELLOW 03/24/2018 0530   APPEARANCEUR CLEAR 03/24/2018 0530   LABSPEC 1.013 03/24/2018 0530   PHURINE 6.0 03/24/2018 0530   GLUCOSEU NEGATIVE 03/24/2018 0530   HGBUR SMALL (A) 03/24/2018 0530   BILIRUBINUR small (A) 02/14/2021 1450   KETONESUR negative 02/14/2021 1450   KETONESUR NEGATIVE 03/24/2018 0530   PROTEINUR >=300 (A) 02/14/2021 1450   PROTEINUR NEGATIVE 03/24/2018 0530   UROBILINOGEN 0.2 02/14/2021 1450   NITRITE Positive (A) 02/14/2021 1450   NITRITE NEGATIVE 03/24/2018 0530   LEUKOCYTESUR Small (1+) (A) 02/14/2021 1450   Sepsis Labs: Invalid input(s): PROCALCITONIN, [place]  Microbiology: Recent Results (from the past 240 hour(s))  Resp Panel by RT-PCR (Flu A&B, Covid) Nasopharyngeal Swab     Status: Abnormal   Collection Time: 05/07/21  5:04 PM   Specimen: Nasopharyngeal Swab; Nasopharyngeal(NP) swabs in vial transport medium  Result Value Ref Range Status   SARS Coronavirus 2 by RT PCR POSITIVE (A) NEGATIVE Final    Comment: (NOTE) SARS-CoV-2 target nucleic acids are DETECTED.  The SARS-CoV-2 RNA is generally detectable in upper respiratory specimens during the acute phase of infection. Positive results are indicative of the presence of the identified virus, but do not rule out bacterial infection or co-infection with other pathogens not detected by the test. Clinical correlation with patient history and other diagnostic information is necessary to determine  patient infection status. The expected result is Negative.  Fact Sheet for Patients: EntrepreneurPulse.com.au  Fact Sheet for Healthcare Providers: IncredibleEmployment.be  This test is not yet approved or cleared by the [place] FDA and  has been authorized for detection and/or diagnosis of SARS-CoV-2 by FDA under an Emergency Use Authorization (EUA).  This EUA will remain in effect (meaning this test can  be used) for the duration of  the COVID-19 declaration under Section 564(b)(1) of the A ct, 21 U.S.C. section 360bbb-3(b)(1), unless the authorization is terminated or revoked sooner.     Influenza A by PCR NEGATIVE NEGATIVE Final   Influenza B by PCR NEGATIVE NEGATIVE Final    Comment: (NOTE) The Xpert Xpress SARS-CoV-2/FLU/RSV plus assay is intended as an aid in the diagnosis of influenza from Nasopharyngeal swab specimens and should not be used as a sole basis for treatment. Nasal washings and aspirates are unacceptable for Xpert Xpress SARS-CoV-2/FLU/RSV testing.  Fact Sheet for Patients: EntrepreneurPulse.com.au  Fact Sheet for Healthcare Providers: IncredibleEmployment.be  This test is not yet approved or cleared by the [place] FDA and has been authorized for detection and/or diagnosis of SARS-CoV-2 by FDA under an Emergency Use Authorization (EUA). This EUA will remain in effect (meaning this test can be used) for the duration of the COVID-19 declaration under Section 564(b)(1) of the Act, 21 U.S.C. section 360bbb-3(b)(1), unless the authorization is terminated or revoked.  Performed at [facility], [address], [place], [place] [address]   Surgical pcr screen     Status: Abnormal   Collection Time: 05/07/21 11:45 PM   Specimen: Nasal Mucosa; Nasal Swab  Result Value Ref Range Status   MRSA, PCR POSITIVE (A) NEGATIVE Final    Comment: RESULT CALLED TO, READ BACK BY AND VERIFIED  WITH: J ATANBILA,RN@0440  05/08/21 [place]    Staphylococcus aureus POSITIVE (A) NEGATIVE Final    Comment: (NOTE) The Xpert SA Assay (FDA approved for NASAL specimens in patients 61 years of age and older), is one component of a comprehensive surveillance program. It is not intended to diagnose infection nor to guide or monitor treatment. Performed at [facility], [place] [address], [place], [place] [address]     Radiology Studies: No results found.     Francess Mullen T. [place]  If 7PM-7AM, please contact night-coverage [email or web address] 05/12/2021, 5:27 PM

## 2021-05-12 NOTE — Plan of Care (Signed)

## 2021-05-12 NOTE — Evaluation (Signed)
Occupational Therapy Evaluation Patient Details Name: Laurie Moreno MRN: [id number] DOB: [birth year] Today's Date: 05/12/2021   History of Present Illness Laurie Moreno  is a 71 y.o. female who patient presents to ED secondary to complaints of right ankle pain and deformity after a fall, patient reports she was visiting her doctor's office, when it was raining, and she slipped due to rain, and fell backward, and struck the back of the head and twisted her right ankle, she had immediate ankle pain; found to have bimalleolar fx, now s/p ORIF, NWB RLE; with past medical history of bipolar disorder, tremors, CHF, COPD, breast cancer   Clinical Impression   Pt reports independence with ADLs and functional mobility at baseline, currently min-mod A for ADLs, min A for bed mobility, and min A for transfers. Pt displays difficulty with adhering to precautions requires increased cuing and therapist's foot underneath pt's foot. Pt able to hop short distances, but unable to complete chair transfer using hopping technique or shuffling foot. Pt presenting with impairments listed below, will continue to follow acutely. Recommend d/c home with [place].     Recommendations for follow up therapy are one component of a multi-disciplinary discharge planning process, led by the attending physician.  Recommendations may be updated based on patient status, additional functional criteria and insurance authorization.   Follow Up Recommendations  Home health OT    Assistance Recommended at Discharge Set up Supervision/Assistance  Patient can return home with the following A lot of help with walking and/or transfers;Assistance with cooking/housework;Help with stairs or ramp for entrance;A lot of help with bathing/dressing/bathroom    Functional Status Assessment  Patient has had a recent decline in their functional status and demonstrates the ability to make significant improvements in function in a reasonable and predictable  amount of time.  Equipment Recommendations  Wheelchair (measurements OT);Wheelchair cushion (measurements OT);Other (comment);Tub/shower seat (RW)    Recommendations for Other Services PT consult     Precautions / Restrictions Precautions Precautions: Fall;Other (comment) Precaution Comments: Covid Restrictions Weight Bearing Restrictions: Yes RLE Weight Bearing: Non weight bearing      Mobility Bed Mobility Overal bed mobility: Needs Assistance Bed Mobility: Supine to Sit     Supine to sit: Min assist     General bed mobility comments: min HHA for trunk elevation    Transfers Overall transfer level: Needs assistance Equipment used: Rolling walker (2 wheels) Transfers: Bed to chair/wheelchair/BSC;Sit to/from Stand Sit to Stand: Min assist Stand pivot transfers: Min assist         General transfer comment: pt requires increased cuing, benefits from use of RW, however still has difficulty adhering to NWB precautions for RLE. Pt benefits from elevated surface and counting prior to transfer      Balance Overall balance assessment: Needs assistance Sitting-balance support: Feet supported Sitting balance-Leahy Scale: Good     Standing balance support: Bilateral upper extremity supported;During functional activity;Reliant on assistive device for balance Standing balance-Leahy Scale: Poor                             ADL either performed or assessed with clinical judgement   ADL Overall ADL's : Needs assistance/impaired Eating/Feeding: Set up;Sitting   Grooming: Set up;Sitting   Upper Body Bathing: Sitting;Min guard   Lower Body Bathing: Moderate assistance;Sitting/lateral leans   Upper Body Dressing : Min guard;Sitting   Lower Body Dressing: Moderate assistance;Sitting/lateral leans Lower Body Dressing Details (indicate cue type and  reason): don sock sitting EOB Toilet Transfer: BSC/3in1;Stand-pivot;Minimal assistance;Cueing for sequencing;Cueing  for safety;Rolling walker (2 wheels)   Toileting- Clothing Manipulation and Hygiene: Maximal assistance;Sitting/lateral lean Toileting - Clothing Manipulation Details (indicate cue type and reason): max A to adjust clothing prior to sitting     Functional mobility during ADLs: Minimal assistance;Moderate assistance       Vision   Vision Assessment?: No apparent visual deficits     Perception     Praxis      Pertinent Vitals/Pain Pain Assessment: Faces Pain Score: 2  Faces Pain Scale: Hurts a little bit Pain Location: R ankle Pain Descriptors / Indicators: Grimacing;Guarding;Throbbing Pain Intervention(s): Limited activity within patient's tolerance;Monitored during session;Repositioned;Premedicated before session     Hand Dominance     Extremity/Trunk Assessment Upper Extremity Assessment Upper Extremity Assessment: Generalized weakness   Lower Extremity Assessment Lower Extremity Assessment: Defer to PT evaluation   Cervical / Trunk Assessment Cervical / Trunk Assessment: Normal   Communication Communication Communication: No difficulties   Cognition Arousal/Alertness: Awake/alert Behavior During Therapy: WFL for tasks assessed/performed Overall Cognitive Status: Within Functional Limits for tasks assessed                                       General Comments  VSS throughout session    Exercises     Shoulder Instructions      Home Living Family/patient expects to be discharged to:: Private residence Living Arrangements: Non-relatives/Friends;Other (Comment) (sister) Available Help at Discharge: Family Type of Home: Mobile home Home Access: Ramped entrance     Home Layout: One level     Bathroom Shower/Tub: Tub/shower unit;Other (comment)         Home Equipment: BSC/3in1          Prior Functioning/Environment Prior Level of Function : Independent/Modified Independent                        OT Problem List:  Decreased strength;Decreased range of motion;Decreased activity tolerance;Decreased knowledge of use of DME or AE;Decreased safety awareness      OT Treatment/Interventions: Self-care/ADL training;Therapeutic exercise;Energy conservation;Therapeutic activities;Visual/perceptual remediation/compensation;Patient/family education    OT Goals(Current goals can be found in the care plan section) Acute Rehab OT Goals Patient Stated Goal: return home OT Goal Formulation: With patient Time For Goal Achievement: 05/26/21 Potential to Achieve Goals: Good ADL Goals Pt Will Perform Lower Body Dressing: with min guard assist;sit to/from stand;sitting/lateral leans;with adaptive equipment Pt Will Transfer to Toilet: with min guard assist;squat pivot transfer;bedside commode Pt Will Perform Tub/Shower Transfer: with min guard assist;ambulating;rolling walker;shower seat  OT Frequency: Min 2X/week    Co-evaluation              AM-PAC OT "6 Clicks" Daily Activity     Outcome Measure Help from another person eating meals?: None Help from another person taking care of personal grooming?: A Little Help from another person toileting, which includes using toliet, bedpan, or urinal?: A Lot Help from another person bathing (including washing, rinsing, drying)?: A Lot Help from another person to put on and taking off regular upper body clothing?: A Little Help from another person to put on and taking off regular lower body clothing?: A Lot 6 Click Score: 16   End of Session Equipment Utilized During Treatment: Gait belt;Rolling walker (2 wheels) Nurse Communication: Mobility status  Activity Tolerance: Patient tolerated treatment well;Patient  limited by fatigue Patient left: in chair;with call bell/phone within reach;with chair alarm set;Other (comment) (MD in room)  OT Visit Diagnosis: Repeated falls (R29.6);Muscle weakness (generalized) (M62.81);Other abnormalities of gait and mobility  (R26.89);History of falling (Z91.81)                Time: 3353-3174 OT Time Calculation (min): 30 min Charges:  OT General Charges $OT Visit: 1 Visit OT Evaluation $OT Eval Moderate Complexity: 1 Mod OT Treatments $Self Care/Home Management : 8-22 mins  Lynnda Child, OTD, OTR/L Acute Rehab [phone] - [place] 05/12/2021, 10:08 AM

## 2021-05-13 DIAGNOSIS — Z8739 Personal history of other diseases of the musculoskeletal system and connective tissue: Secondary | ICD-10-CM

## 2021-05-13 DIAGNOSIS — U071 COVID-19: Secondary | ICD-10-CM | POA: Diagnosis present

## 2021-05-13 DIAGNOSIS — R251 Tremor, unspecified: Secondary | ICD-10-CM

## 2021-05-13 DIAGNOSIS — K219 Gastro-esophageal reflux disease without esophagitis: Secondary | ICD-10-CM

## 2021-05-13 DIAGNOSIS — S82891A Other fracture of right lower leg, initial encounter for closed fracture: Secondary | ICD-10-CM | POA: Diagnosis present

## 2021-05-13 DIAGNOSIS — Z8659 Personal history of other mental and behavioral disorders: Secondary | ICD-10-CM

## 2021-05-13 NOTE — Progress Notes (Signed)
Physical Therapy Treatment Patient Details Name: Laurie Moreno MRN: [id number] DOB: Aug 08, 1950 Today's Date: 05/13/2021   History of Present Illness Laurie Moreno  is a 71 y.o. female who patient presents to ED secondary to complaints of right ankle pain and deformity after a fall, patient reports she was visiting her doctor's office, when it was raining, and she slipped due to rain, and fell backward, and struck the back of the head and twisted her right ankle, she had immediate ankle pain; found to have bimalleolar fx, now s/p ORIF, NWB RLE; with past medical history of bipolar disorder, tremors, CHF, COPD, breast cancer    PT Comments    Continuing work on functional mobility and activity tolerance;  session focused on teaching pt's family wheelchair parts management and propulsion and management of BSC, as well as techniques to provide assistance; discussed car transfers, and management at home; Questions solicited and answered   Recommendations for follow up therapy are one component of a multi-disciplinary discharge planning process, led by the attending physician.  Recommendations may be updated based on patient status, additional functional criteria and insurance authorization.  Follow Up Recommendations  Home health PT     Assistance Recommended at Discharge Frequent or constant Supervision/Assistance  Patient can return home with the following A lot of help with walking and/or transfers;Assist for transportation;Help with stairs or ramp for entrance (Assist initially with wheelchair management)   Equipment Recommendations  Rolling walker (2 wheels);BSC/3in1;Wheelchair (measurements PT);Wheelchair cushion (measurements PT) (Consider drop-arm)    Recommendations for Other Services       Precautions / Restrictions Precautions Precautions: Fall;Other (comment) Precaution Comments: Covid Restrictions Weight Bearing Restrictions: Yes RLE Weight Bearing: Non weight bearing      Mobility  Bed Mobility Overal bed mobility: Needs Assistance Bed Mobility: Supine to Sit     Supine to sit: Min guard Sit to supine: Min guard   General bed mobility comments: Incr time, but no need for physical assist    Transfers Overall transfer level: Needs assistance Equipment used: Sliding board;Rolling walker (2 wheels) Transfers: Bed to chair/wheelchair/BSC Sit to Stand: Min assist Stand pivot transfers: Min assist   Step pivot transfers: Min assist    Lateral/Scoot Transfers: Min assist General transfer comment: Close guard and min assist to steady; education and demonstration of sliding board placement for lateral scooting; Sister present and gave good steaying assist of wheelchair during lateral scoot transfers; incr time; close min assist for step-pivot transfer bed to [facility] with RW; noting pt takes a few tries before getting to upright standing; min assist to move RW as well    Ambulation/Gait                   Secretary/administrator Wheelchair mobility: Yes Wheelchair propulsion: Both upper extremities;Left lower extremity Wheelchair parts: Needs assistance Distance:  (in room inclusing lots of turns arougn obstacles) Wheelchair Assistance Details (indicate cue type and reason): min assist for parts manaement throughout; Sister and brother-in-law present and engaged in wheelchair parts management education and propulsion education  Modified Rankin (Stroke Patients Only)       Balance Overall balance assessment: Needs assistance Sitting-balance support: Feet supported Sitting balance-Leahy Scale: Good     Standing balance support: Bilateral upper extremity supported;During functional activity;Reliant on assistive device for balance Standing balance-Leahy Scale: Poor  Cognition Arousal/Alertness: Awake/alert Behavior During Therapy: WFL for tasks  assessed/performed Overall Cognitive Status: Within Functional Limits for tasks assessed                                 General Comments: Noting pt can get overwhelmed with all the steps and mechanisms involved with wheelchair parts management and use of drop-arm BSC; will need reinforcement and recommend involving family in wheelchair training, DME training        Exercises      General Comments General comments (skin integrity, edema, etc.): Family present, discussed car transfer, as well as considerations for choosing lateral scooting vs stand pivot for transfers; lateral scooting tends to be more stable with less fall risk, but requires more setup of equipment; stand pivot can be simpler setup, but higher fall risk with hop-steps on one foot      Pertinent Vitals/Pain Pain Assessment: No/denies pain    Home Living                          Prior Function            PT Goals (current goals can now be found in the care plan section) Acute Rehab PT Goals Patient Stated Goal: Hopes to be able to get home soon PT Goal Formulation: With patient Time For Goal Achievement: 05/25/21 Potential to Achieve Goals: Good Progress towards PT goals: Progressing toward goals    Frequency    Min 5X/week      PT Plan Current plan remains appropriate    Co-evaluation              AM-PAC PT "6 Clicks" Mobility   Outcome Measure  Help needed turning from your back to your side while in a flat bed without using bedrails?: None Help needed moving from lying on your back to sitting on the side of a flat bed without using bedrails?: A Little Help needed moving to and from a bed to a chair (including a wheelchair)?: A Little Help needed standing up from a chair using your arms (e.g., wheelchair or bedside chair)?: A Little Help needed to walk in hospital room?: Total Help needed climbing 3-5 steps with a railing? : Total 6 Click Score: 15    End of  Session Equipment Utilized During Treatment: Gait belt Activity Tolerance: Patient tolerated treatment well Patient left: with family/visitor present (on BSC with OT in room) Nurse Communication: Mobility status PT Visit Diagnosis: Unsteadiness on feet (R26.81);Other abnormalities of gait and mobility (R26.89);Pain Pain - Right/Left: Right Pain - part of body: Ankle and joints of foot     Time: 1210-1306 PT Time Calculation (min) (ACUTE ONLY): 56 min  Charges:  $Therapeutic Activity: 23-37 mins $Self Care/Home Management: 8-22 $Wheel Chair Management: 8-22 mins                     Roney Marion, [place]  Acute Rehabilitation Services Pager [phone] Office [place] 05/13/2021, 5:29 PM

## 2021-05-13 NOTE — Plan of Care (Signed)

## 2021-05-13 NOTE — Progress Notes (Signed)
Occupational Therapy Treatment Patient Details Name: Laurie Moreno MRN: [id number] DOB: 28-Dec-1950 Today's Date: 05/13/2021   History of present illness Laurie Moreno  is a 71 y.o. female who patient presents to ED secondary to complaints of right ankle pain and deformity after a fall, patient reports she was visiting her doctor's office, when it was raining, and she slipped due to rain, and fell backward, and struck the back of the head and twisted her right ankle, she had immediate ankle pain; found to have bimalleolar fx, now s/p ORIF, NWB RLE; with past medical history of bipolar disorder, tremors, CHF, COPD, breast cancer   OT comments  Pt progressing towards goals this session, requiring min guard - min A for ADLs, min guard for mobility, and min A for transfers. Pt still requires heavy cuing for hand placement during transfers, as well as steadying self and backing up close to surface prior to sitting. Pt continues to present with impairments listed below, will follow acutely. Will return for second session this afternoon for family education. Recommend d/c home with [place] [facility].   Recommendations for follow up therapy are one component of a multi-disciplinary discharge planning process, led by the attending physician.  Recommendations may be updated based on patient status, additional functional criteria and insurance authorization.    Follow Up Recommendations  Home health OT    Assistance Recommended at Discharge Set up Supervision/Assistance  Patient can return home with the following  A lot of help with walking and/or transfers;Assistance with cooking/housework;Help with stairs or ramp for entrance;A lot of help with bathing/dressing/bathroom   Equipment Recommendations  Wheelchair (measurements OT);Wheelchair cushion (measurements OT);Other (comment);Tub/shower seat    Recommendations for Other Services PT consult    Precautions / Restrictions Precautions Precautions: Fall;Other  (comment) Precaution Comments: Covid Restrictions Weight Bearing Restrictions: Yes RLE Weight Bearing: Non weight bearing       Mobility Bed Mobility Overal bed mobility: Needs Assistance Bed Mobility: Supine to Sit;Sit to Supine     Supine to sit: Min guard Sit to supine: Min guard   General bed mobility comments: increased time for trunk elevation, benefits from use of bedrail    Transfers Overall transfer level: Needs assistance Equipment used: Rolling walker (2 wheels) Transfers: Sit to/from Stand;Bed to chair/wheelchair/BSC Sit to Stand: Min assist Stand pivot transfers: Min assist        Lateral/Scoot Transfers: Min assist General transfer comment: able to better manage NWB precautions during stand pivot transfer     Balance Overall balance assessment: Needs assistance Sitting-balance support: Feet supported Sitting balance-Leahy Scale: Good     Standing balance support: Bilateral upper extremity supported;During functional activity;Reliant on assistive device for balance Standing balance-Leahy Scale: Poor                             ADL either performed or assessed with clinical judgement   ADL Overall ADL's : Needs assistance/impaired                     Lower Body Dressing: Min guard;Cueing for safety;Cueing for sequencing;Sitting/lateral leans Lower Body Dressing Details (indicate cue type and reason): bridges in bed to don LB clothing Toilet Transfer: Requires drop arm;BSC/3in1;Squat-pivot;Cueing for sequencing;Cueing for safety;Minimal assistance;Rolling walker (2 wheels)   Toileting- Clothing Manipulation and Hygiene: Supervision/safety;Sitting/lateral lean       Functional mobility during ADLs: Minimal assistance      Extremity/Trunk Assessment Upper Extremity Assessment Upper Extremity  Assessment: Generalized weakness   Lower Extremity Assessment Lower Extremity Assessment: Defer to PT evaluation        Vision    Vision Assessment?: No apparent visual deficits   Perception Perception Perception: Not tested   Praxis Praxis Praxis: Not tested    Cognition Arousal/Alertness: Awake/alert Behavior During Therapy: WFL for tasks assessed/performed Overall Cognitive Status: Within Functional Limits for tasks assessed                                            Exercises     Shoulder Instructions       General Comments pt better able to manage NWB while hopping during transfers this session, still needs heavy cuing for hand placement when standing/sitting. Plan to return for session later this afternoon with family present for education.    Pertinent Vitals/ Pain       Pain Assessment: No/denies pain  Home Living                                          Prior Functioning/Environment              Frequency  Min 2X/week        Progress Toward Goals  OT Goals(current goals can now be found in the care plan section)  Progress towards OT goals: Progressing toward goals  Acute Rehab OT Goals Patient Stated Goal: to go home OT Goal Formulation: With patient Time For Goal Achievement: 05/26/21 Potential to Achieve Goals: Good ADL Goals Pt Will Perform Lower Body Dressing: with min guard assist;sit to/from stand;sitting/lateral leans;with adaptive equipment Pt Will Transfer to Toilet: with min guard assist;squat pivot transfer;bedside commode Pt Will Perform Tub/Shower Transfer: with min guard assist;ambulating;rolling walker;shower seat  Plan Discharge plan remains appropriate;Frequency remains appropriate    Co-evaluation                 AM-PAC OT "6 Clicks" Daily Activity     Outcome Measure   Help from another person eating meals?: None Help from another person taking care of personal grooming?: A Little Help from another person toileting, which includes using toliet, bedpan, or urinal?: A Little Help from another person bathing  (including washing, rinsing, drying)?: A Lot Help from another person to put on and taking off regular upper body clothing?: A Little Help from another person to put on and taking off regular lower body clothing?: A Little 6 Click Score: 18    End of Session Equipment Utilized During Treatment: Gait belt;Rolling walker (2 wheels)  OT Visit Diagnosis: Repeated falls (R29.6);Muscle weakness (generalized) (M62.81);Other abnormalities of gait and mobility (R26.89);History of falling (Z91.81)   Activity Tolerance Patient tolerated treatment well;Patient limited by fatigue   Patient Left in bed;with call bell/phone within reach;with bed alarm set   Nurse Communication Mobility status        Time: 6387-5643 OT Time Calculation (min): 32 min  Charges: OT General Charges $OT Visit: 1 Visit OT Treatments $Self Care/Home Management : 23-37 mins  Lynnda Child, OTD, OTR/L Acute Rehab [phone]) [phone]   Kaylyn Lim 05/13/2021, 1:36 PM

## 2021-05-13 NOTE — Discharge Summary (Signed)
Physician Discharge Summary  Laurie Moreno DOB: 16-Jun-1950 DOA: 05/07/2021  PCP: Derek Jack, MD  Admit date: 05/07/2021 Discharge date: 05/13/2021 Admitted From: Home Disposition: Home Recommendations for Outpatient Follow-up:  Follow ups as below. Please obtain CBC/BMP/Mag at follow up Please follow up on the following pending results: None Home Health: PT/OT Equipment/Devices: Wheelchair, rolling walker and 3 in 1 commode Discharge Condition: Stable CODE STATUS: Full code  Follow-up Information     Derek Jack, MD. Schedule an appointment as soon as possible for a visit in 1 week(s).   Specialty: Hematology Contact information: [place] [address] [phone]         Willaim Sheng, MD. Schedule an appointment as soon as possible for a visit in 2 week(s).   Specialty: Orthopedic Surgery Contact information: [address] [place] [address] [phone]         Health, [facility] Follow up.   Specialty: Home Health Services Why: Agency now goes by the name [facility]. The office will call to schedule home health visits. Contact information: [place] [address] [phone]                Hospital Course: 71 year old F with PMH of diastolic CHF, bipolar disorder, hand tremor, COPD, tobacco use disorder and left breast cancer status postmastectomy presenting to [facility] ED with right ankle pain and deformity after she had an accidental fall on the way to her psychiatrist office. She slipped due to rain, and fell backward, and struck the back of the head and twisted her right ankle, she had immediate ankle pain, unable to bear weight, she denies any loss of consciousness.  X-ray showed bimalleolar right ankle fracture.  She also tested positive for COVID-19.  She is not symptomatic from COVID infection.  She is vaccinated and boosted.  Transferred to [facility] due to lack of  [place] room at [facility].    Patient underwent ORIF by Dr. Zachery Dakins on 05/10/2021, and discharged with home health and DME as recommended by therapy.  In regards to COVID-19 infection, remained asymptomatic.  She received molnupiravir for 3 days.  Outpatient follow-up as above.  See individual problem list below for more on hospital course.  Discharge Diagnoses:  Accidental fall-slipped and fell Right ankle fracture/bimalleolar fracture -S/p ORIF. -Scheduled Tylenol with as needed oxycodone for pain control -NWB LLE -Home health and DME as above. -Subcu Lovenox for VTE prophylaxis -Bowel regimen -Outpatient follow-up with orthopedic surgery in 2 weeks   COVID-19 infection-incidental finding.  Vaccinated x2 and booster x3.  As symptomatic. -Received molnupiravir for 3 days.   Postop fever: Unclear etiology.  Febrile to 100.5 the next day but no further fever since then.  No clear source of infection.  Foley out.  No respiratory symptoms.  A little anxious about this   History of osteoporosis: Reportedly received Prolia a week prior to admission.  -Continue Prolia outpatient   Hypertension: Normotensive. -Continue home medications.   Chronic COPD: No respiratory symptoms. -Continue albuterol as needed. -Encouraged smoking cessation -Nicotine patch as needed   History of breast cancer s/p left mastectomy -continue with anastrozole -Outpatient follow-up with oncologist.   Hyperlipidemia -Continue with statin   Bipolar disorder/anxiety: Stable. -Continue home meds.   Hand tremors.  History of Parkinson? -Continue Sinemet and primidone   Hypothyroidism -Continue with Synthroid   Chronic thrombocytopenia: Improved. -Recheck CBC at follow-up   Tobacco use disorder: Reports smoking about  10 to 14 cigarettes a day. -Counseled and encouraged cessation -Nicotine patch   Body mass index is 28.32 kg/m.           Discharge Exam: Vitals:   05/12/21 0625 05/12/21  0813 05/12/21 2007 05/13/21 0800  BP: 110/68 111/78 (!) 102/58 116/73  Pulse: 80 99 86 81  Temp: 98.8 F (37.1 C) 97.8 F (36.6 C) 98.4 F (36.9 C) 99.3 F (37.4 C)  Resp: 16 18 16 18   Height:      Weight:      SpO2: 91% 91% 92% 92%  TempSrc:   Oral Oral  BMI (Calculated):         GENERAL: No apparent distress.  Nontoxic. HEENT: MMM.  Vision and hearing grossly intact.  NECK: Supple.  No apparent JVD.  RESP: 92% on RA.  No IWOB.  Fair aeration bilaterally. CVS:  RRR. Heart sounds normal.  ABD/GI/GU: Bowel sounds present. Soft. Non tender.  MSK/EXT:  Moves extremities.  Dressing over right foot DCI.  Neurovascular intact distally SKIN: Dressing over right foot DCI. NEURO: Awake and alert.  Oriented appropriately.  No apparent focal neuro deficit. PSYCH: Calm. Normal affect.   Discharge Instructions  Discharge Instructions     Call MD for:  extreme fatigue   Complete by: As directed    Call MD for:  persistant dizziness or light-headedness   Complete by: As directed    Call MD for:  redness, tenderness, or signs of infection (pain, swelling, redness, odor or green/yellow discharge around incision site)   Complete by: As directed    Call MD for:  severe uncontrolled pain   Complete by: As directed    Call MD for:  temperature >100.4   Complete by: As directed    Diet - low sodium heart healthy   Complete by: As directed    Discharge instructions   Complete by: As directed    It has been a pleasure taking care of you!  You were hospitalized due to right ankle fracture for which you have been treated surgically.  Your surgeon recommended not bearing weight on right leg, and follow-up in their office in 2 weeks.  Continue Lovenox to prevent blood clot formation.  Taking pain medications as needed.  Please do not drive or engage in an activity that requires focus and concentration while taking oxycodone as it could cause sleepiness, sedation and increased risk of fall.   In  regards to COVID infection, you are considered contagious for the next 6 days.  We recommend wearing appropriate mask and using good hand sanitizer.  Follow-up with your primary care doctor in 1 to 2 weeks or sooner if needed  It is important that you quit smoking cigarettes.  You may use nicotine patch to help you quit smoking.  Nicotine patch is available over-the-counter.  You may also discuss other options to help you quit smoking with your primary care doctor. You can also talk to professional counselors at 1-800-QUIT-NOW 848-407-2294) for free smoking cessation counseling.     Take care,   Discharge wound care:   Complete by: As directed    Leave dressing on until follow-up with your surgeon   Increase activity slowly   Complete by: As directed       Allergies as of 05/13/2021       Reactions   Codeine Nausea And Vomiting   In cough syrup cause nausea and vomiting   Penicillins Hives, Rash, Other (See Comments)   Did  it involve swelling of the face/tongue/throat, SOB, or low BP? Unknown Did it involve sudden or severe rash/hives, skin peeling, or any reaction on the inside of your mouth or nose? Unknown Did you need to seek medical attention at a hospital or doctor's office? Unknown When did it last happen?    as a child If all above answers are NO, may proceed with cephalosporin use.         Medication List     TAKE these medications    acetaminophen 500 MG tablet Commonly known as: TYLENOL Take 2 tablets (1,000 mg total) by mouth every 8 (eight) hours for 5 days, THEN 2 tablets (1,000 mg total) every 8 (eight) hours as needed for up to 10 days. Start taking on: May 12, 2021   albuterol 108 (90 Base) MCG/ACT inhaler Commonly known as: VENTOLIN HFA Inhale 2 puffs into the lungs as needed for wheezing or shortness of breath.   anastrozole 1 MG tablet Commonly known as: ARIMIDEX TAKE 1 TABLET BY MOUTH EVERY DAY   aspirin EC 81 MG tablet Take 81 mg by  mouth daily. Swallow whole.   atorvastatin 40 MG tablet Commonly known as: LIPITOR Take 40 mg by mouth daily.   Calcium 600+D3 Plus Minerals 600-800 MG-UNIT Chew Chew 1 tablet by mouth 2 (two) times daily.   carbidopa-levodopa 25-100 MG tablet Commonly known as: SINEMET IR Take 1.5 tablets by mouth 3 (three) times daily.   carvedilol 3.125 MG tablet Commonly known as: COREG Take 3.125 mg by mouth 2 (two) times daily with a meal.   clonazePAM 0.5 MG tablet Commonly known as: KLONOPIN Take 1 tablet (0.5 mg total) by mouth 2 (two) times daily. What changed: when to take this   divalproex 250 MG DR tablet Commonly known as: DEPAKOTE Take 250 mg by mouth 3 (three) times daily. Do not crush   enoxaparin 40 MG/0.4ML injection Commonly known as: LOVENOX inject 1 syringe (40 mg total) into the skin daily.   escitalopram 20 MG tablet Commonly known as: LEXAPRO Take 20 mg by mouth daily.   fluticasone 50 MCG/ACT nasal spray Commonly known as: FLONASE Place 2 sprays into both nostrils daily.   gabapentin 300 MG capsule Commonly known as: NEURONTIN Take 300 mg by mouth at bedtime.   hydrOXYzine 25 MG tablet Commonly known as: ATARAX Take 25 mg by mouth every 8 (eight) hours as needed for itching.   levothyroxine 125 MCG tablet Commonly known as: SYNTHROID Take 125 mcg by mouth daily before breakfast.   lisinopril 2.5 MG tablet Commonly known as: ZESTRIL Take 2.5 mg by mouth daily.   multivitamin tablet Take 1 tablet by mouth every morning.   oxybutynin 5 MG 24 hr tablet Commonly known as: DITROPAN-XL Take 5 mg by mouth daily.   oxyCODONE 5 MG immediate release tablet Commonly known as: Oxy IR/ROXICODONE Take 1 tablet (5 mg total) by mouth every 8 (eight) hours as needed for up to 7 days for moderate pain or severe pain.   pantoprazole 40 MG tablet Commonly known as: PROTONIX TAKE 1 TABLET BY MOUTH EVERY DAY BEFORE BREAKFAST What changed: See the new  instructions.   primidone 50 MG tablet Commonly known as: MYSOLINE Take 100 mg by mouth 2 (two) times daily.   PROLIA Terry Inject into the skin every 6 (six) months.   senna-docusate 8.6-50 MG tablet Commonly known as: Senokot-S Take 1 tablet by mouth 2 (two) times daily between meals as needed for mild constipation.  Vitamin D 50 MCG (2000 UT) tablet Take 2,000 Units by mouth daily.               Durable Medical Equipment  (From admission, onward)           Start     Ordered   05/12/21 1025  For home use only DME lightweight manual wheelchair with seat cushion  Once       Comments: Patient suffers from ankle fracture which impairs their ability to perform daily activities like bathing and toileting in the home.  A walker will not resolve  issue with performing activities of daily living. A wheelchair will allow patient to safely perform daily activities. Patient is not able to propel themselves in the home using a standard weight wheelchair due to general weakness. Patient can self propel in the lightweight wheelchair. Length of need 6 months . Accessories: elevating leg rests (ELRs), wheel locks, extensions and anti-tippers.   05/12/21 1024   05/12/21 1025  For home use only DME Walker rolling  Once       Question Answer Comment  Walker: With 5 Inch Wheels   Patient needs a walker to treat with the following condition Ankle fracture      05/12/21 1024   05/12/21 1025  For home use only DME 3 n 1  Once       Comments: Drop arm   05/12/21 1024              Discharge Care Instructions  (From admission, onward)           Start     Ordered   05/12/21 0000  Discharge wound care:       Comments: Leave dressing on until follow-up with your surgeon   05/12/21 0757            Consultations: Orthopedic surgery  Procedures/Studies: ORIF of right bimalleolar ankle fracture   DG Ankle Complete Right  Result Date: 05/10/2021 CLINICAL DATA:  Fracture  T14.8XXA (ICD-10-CM) EXAM: RIGHT ANKLE - COMPLETE 3+ VIEW COMPARISON:  05/07/2021. FINDINGS: Fluoro time: 51 seconds. Reported radiation dose: 121 mGy. Nine C-arm fluoroscopic images were obtained intraoperatively and submitted for post operative interpretation. These images demonstrate plate screw fixation of the distal fibula and 2 screw fixation of the medial malleolus. Please see the performing provider's procedural report for further detail. IMPRESSION: Intraoperative fluoroscopy, as detailed above. Electronically Signed   By: Margaretha Sheffield M.D.   On: 05/10/2021 11:05   DG Ankle Complete Right  Result Date: 05/07/2021 CLINICAL DATA:  Right ankle pain and swelling after fall. EXAM: RIGHT ANKLE - COMPLETE 3+ VIEW COMPARISON:  None. FINDINGS: Acute fracture of the medial malleolus with mild medial and inferior displacement. Acute comminuted fracture of the distal fibular metaphysis with mild lateral displacement and overriding. No obvious posterior malleolar fracture. Asymmetric widening of the ankle mortise. No dislocation. Joint spaces are preserved. Diffuse soft tissue swelling, more prominent laterally. IMPRESSION: 1. Acute bimalleolar fractures with ankle instability. Electronically Signed   By: Titus Dubin M.D.   On: 05/07/2021 15:22   CT Head Wo Contrast  Result Date: 05/07/2021 CLINICAL DATA:  Head trauma.  Status post fall.  Hit back of head EXAM: CT HEAD WITHOUT CONTRAST TECHNIQUE: Contiguous axial images were obtained from the base of the skull through the vertex without intravenous contrast. COMPARISON:  MRI brain 08/06/2017. FINDINGS: Brain: No evidence of acute infarction, hemorrhage, hydrocephalus, extra-axial collection or mass lesion/mass effect. There is mild  diffuse low-attenuation within the subcortical and periventricular white matter compatible with chronic microvascular disease. Prominence of the sulci and ventricles compatible with brain atrophy. Vascular: No hyperdense vessel  or unexpected calcification. Skull: Normal. Negative for fracture or focal lesion. Sinuses/Orbits: No acute finding. Other: None. IMPRESSION: 1. No acute intracranial abnormalities. 2. Mild chronic small vessel ischemic change and brain atrophy. Electronically Signed   By: Kerby Moors M.D.   On: 05/07/2021 15:46   DG Bone Density  Result Date: 05/01/2021 EXAM: DUAL X-RAY ABSORPTIOMETRY (DXA) FOR BONE MINERAL DENSITY IMPRESSION: Your patient Laurie Moreno completed a BMD test on 04/30/2021 using the [place] (software version: 14.10) manufactured by [employer]. The following summarizes the results of our evaluation. Technologist: [facility] PATIENT BIOGRAPHICAL: Name: Laurie Moreno, Laurie Moreno Patient ID: [id number] Birth Date: [birth year] Height: 64.0 in. Gender: Female Exam Date: 04/30/2021 Weight: 165.0 lbs. Indications: Caucasian, Early Menopause, Follow up Osteopenia, Height Loss, Hx Breast Ca, Post Menopausal, Tobacco User, Tobacco User (Current Smoker) Fractures: Treatments: Prolia, Anastrozole, Aspirin, Calcium, Multivitamin, Synthroid, Vitamin D DENSITOMETRY RESULTS: Site         Region     Measured Date Measured Age WHO Classification Young Adult T-score BMD         %Change vs. Previous Significant Change (*) DualFemur Neck Right 04/30/2021 70.1 Osteopenia -2.4 0.702 g/cm2 -2.0% - DualFemur Neck Right 03/23/2019 68.0 Osteopenia -2.3 0.716 g/cm2 - - DualFemur Total Mean 04/30/2021 70.1 Osteopenia -1.7 0.788 g/cm2 1.9% - DualFemur Total Mean 03/23/2019 68.0 Osteopenia -1.9 0.773 g/cm2 - - Left Forearm Radius 33% 04/30/2021 70.1 Osteopenia -1.2 0.628 g/cm2 - - ASSESSMENT: The BMD measured at Femur Neck Right is 0.702 g/cm2 with a T-score of -2.4. This patient is considered osteopenic according to [facility]) criteria. The scan quality is good. Compared with the prior study on 03/23/2019, the BMD of the total mean hip shows no statistically significant change. Lumbar spine  was excluded due to advanced degenerative changes. Patient is not eligible for FRAX due to use of Prolia. World Pharmacologist [facility]) criteria for post-menopausal, Caucasian Women: Normal:       T-score at or above -1 SD Osteopenia:   T-score between -1 and -2.5 SD Osteoporosis: T-score at or below -2.5 SD RECOMMENDATIONS: 1. All patients should optimize calcium and vitamin D intake. 2. Consider FDA-approved medical therapies in postmenopausal women and med aged 22 years and older, based on the following: a. A hip or vertebral (clinical or morphometric) fracture b. T-score< -2.5 at the femoral neck or spine after appropriate evaluation to exclude secondary causes c. Low bone mass (T-score between -1.0 and -2.5 at the femoral neck or spine) and a 10-year probability of a hip fracture > 3% or a 10-year probability of a major osteoporosis-related fracture > 20% based on the [place]-adapted WHO algorithm d. Clinician judgment and/or patient preferences may indicate treatment for people with 10-year fracture probabilities above or below these levels FOLLOW-UP: Patients with diagnosis of osteoporsis or at high risk for fracture should have regular bone mineral density tests. For patients eligible for Medicare, routine testing is allowed once every 2 years. The testing frequency can be increased to one year for patients who have rapidly progressing disease, those who are receiving or discontinuing medical therapy to restore bone mass, or have additional risk factors. Electronically Signed   By: Marlaine Hind M.D.   On: 05/01/2021 15:42   DG Ankle Right Port  Result Date: 05/11/2021 CLINICAL DATA:  Status  post right ankle fracture ORIF. EXAM: PORTABLE RIGHT ANKLE - 2 VIEW COMPARISON:  05/10/2021 and 05/07/2021. FINDINGS: Lateral fixation plate reduces the distal fibular fracture into anatomic alignment. Two screws reduce the medial malleolar fracture, also into anatomic alignment. Ankle mortise is normally aligned.  Orthopedic hardware is well seated. Ankle encased in a fiberglass cast/splint. IMPRESSION: 1. Well-aligned distal fibula and medial malleolar fractures following ORIF. Normally aligned ankle joint. Electronically Signed   By: Lajean Manes M.D.   On: 05/11/2021 10:30   DG C-Arm 1-60 Min-No Report  Result Date: 05/10/2021 Fluoroscopy was utilized by the requesting physician.  No radiographic interpretation.   DG C-Arm 1-60 Min-No Report  Result Date: 05/10/2021 Fluoroscopy was utilized by the requesting physician.  No radiographic interpretation.       The results of significant diagnostics from this hospitalization (including imaging, microbiology, ancillary and laboratory) are listed below for reference.     Microbiology: Recent Results (from the past 240 hour(s))  Resp Panel by RT-PCR (Flu A&B, Covid) Nasopharyngeal Swab     Status: Abnormal   Collection Time: 05/07/21  5:04 PM   Specimen: Nasopharyngeal Swab; Nasopharyngeal(NP) swabs in vial transport medium  Result Value Ref Range Status   SARS Coronavirus 2 by RT PCR POSITIVE (A) NEGATIVE Final    Comment: (NOTE) SARS-CoV-2 target nucleic acids are DETECTED.  The SARS-CoV-2 RNA is generally detectable in upper respiratory specimens during the acute phase of infection. Positive results are indicative of the presence of the identified virus, but do not rule out bacterial infection or co-infection with other pathogens not detected by the test. Clinical correlation with patient history and other diagnostic information is necessary to determine patient infection status. The expected result is Negative.  Fact Sheet for Patients: EntrepreneurPulse.com.au  Fact Sheet for Healthcare Providers: IncredibleEmployment.be  This test is not yet approved or cleared by the [place] FDA and  has been authorized for detection and/or diagnosis of SARS-CoV-2 by FDA under an Emergency Use Authorization  (EUA).  This EUA will remain in effect (meaning this test can be used) for the duration of  the COVID-19 declaration under Section 564(b)(1) of the A ct, 21 U.S.C. section 360bbb-3(b)(1), unless the authorization is terminated or revoked sooner.     Influenza A by PCR NEGATIVE NEGATIVE Final   Influenza B by PCR NEGATIVE NEGATIVE Final    Comment: (NOTE) The Xpert Xpress SARS-CoV-2/FLU/RSV plus assay is intended as an aid in the diagnosis of influenza from Nasopharyngeal swab specimens and should not be used as a sole basis for treatment. Nasal washings and aspirates are unacceptable for Xpert Xpress SARS-CoV-2/FLU/RSV testing.  Fact Sheet for Patients: EntrepreneurPulse.com.au  Fact Sheet for Healthcare Providers: IncredibleEmployment.be  This test is not yet approved or cleared by the [place] FDA and has been authorized for detection and/or diagnosis of SARS-CoV-2 by FDA under an Emergency Use Authorization (EUA). This EUA will remain in effect (meaning this test can be used) for the duration of the COVID-19 declaration under Section 564(b)(1) of the Act, 21 U.S.C. section 360bbb-3(b)(1), unless the authorization is terminated or revoked.  Performed at [facility], [address], [place],  [address]   Surgical pcr screen     Status: Abnormal   Collection Time: 05/07/21 11:45 PM   Specimen: Nasal Mucosa; Nasal Swab  Result Value Ref Range Status   MRSA, PCR POSITIVE (A) NEGATIVE Final    Comment: RESULT CALLED TO, READ BACK BY AND VERIFIED WITH: J ATANBILA,RN@[id number]  05/08/21 [place]  Staphylococcus aureus POSITIVE (A) NEGATIVE Final    Comment: (NOTE) The Xpert SA Assay (FDA approved for NASAL specimens in patients 35 years of age and older), is one component of a comprehensive surveillance program. It is not intended to diagnose infection nor to guide or monitor treatment. Performed at [facility], [place] [address], [place], [place] [address]      Labs:  CBC: Recent Labs  Lab 05/07/21 1403 05/08/21 0301 05/09/21 0558 05/10/21 0303 05/11/21 0335  WBC 5.0 5.6 6.4 6.5 8.6  NEUTROABS 1.8  --   --   --   --   HGB 14.2 12.4 12.6 12.7 12.8  HCT 42.4 37.6 38.6 37.6 38.9  MCV 95.3 92.6 92.3 92.2 92.6  PLT 130* 116* 115* 120* 139*   BMP &GFR Recent Labs  Lab 05/07/21 1403 05/08/21 0301 05/09/21 0558 05/11/21 0335  NA 140 138 140 139  K 4.8 4.3 4.9 4.7  CL 104 106 104 106  CO2 26 25 27 26   GLUCOSE 103* 172* 138* 154*  BUN 17 15 17 15   CREATININE 0.72 0.61 0.67 0.75  CALCIUM 8.6* 8.3* 7.8* 8.8*  MG  --   --  2.1 1.7  PHOS  --   --  3.6 4.5   Estimated Creatinine Clearance: 64.8 mL/min (by C-G formula based on SCr of 0.75 mg/dL). Liver & Pancreas: Recent Labs  Lab 05/09/21 0558 05/11/21 0335  ALBUMIN 3.4* 3.3*   No results for input(s): LIPASE, AMYLASE in the last 168 hours. No results for input(s): AMMONIA in the last 168 hours. Diabetic: No results for input(s): HGBA1C in the last 72 hours. No results for input(s): GLUCAP in the last 168 hours. Cardiac Enzymes: No results for input(s): CKTOTAL, CKMB, CKMBINDEX, TROPONINI in the last 168 hours. No results for input(s): PROBNP in the last 8760 hours. Coagulation Profile: No results for input(s): INR, PROTIME in the last 168 hours. Thyroid Function Tests: No results for input(s): TSH, T4TOTAL, FREET4, T3FREE, THYROIDAB in the last 72 hours. Lipid Profile: No results for input(s): CHOL, HDL, LDLCALC, TRIG, CHOLHDL, LDLDIRECT in the last 72 hours. Anemia Panel: No results for input(s): VITAMINB12, FOLATE, FERRITIN, TIBC, IRON, RETICCTPCT in the last 72 hours. Urine analysis:    Component Value Date/Time   COLORURINE YELLOW 03/24/2018 0530   APPEARANCEUR CLEAR 03/24/2018 0530   LABSPEC 1.013 03/24/2018 0530   PHURINE 6.0 03/24/2018 0530   GLUCOSEU NEGATIVE 03/24/2018 0530   HGBUR SMALL (A) 03/24/2018 0530   BILIRUBINUR  small (A) 02/14/2021 1450   KETONESUR negative 02/14/2021 1450   KETONESUR NEGATIVE 03/24/2018 0530   PROTEINUR >=300 (A) 02/14/2021 1450   PROTEINUR NEGATIVE 03/24/2018 0530   UROBILINOGEN 0.2 02/14/2021 1450   NITRITE Positive (A) 02/14/2021 1450   NITRITE NEGATIVE 03/24/2018 0530   LEUKOCYTESUR Small (1+) (A) 02/14/2021 1450   Sepsis Labs: Invalid input(s): PROCALCITONIN, LACTICIDVEN   Time coordinating discharge: 55 minutes  SIGNED:  Mercy Riding, MD  [facility] 05/13/2021, 5:23 PM

## 2021-05-13 NOTE — Progress Notes (Signed)
Occupational Therapy Treatment Patient Details Name: Laurie Moreno MRN: [id number] DOB: 1951/03/02 Today's Date: 05/13/2021   History of present illness Laurie Moreno  is a 71 y.o. female who patient presents to ED secondary to complaints of right ankle pain and deformity after a fall, patient reports she was visiting her doctor's office, when it was raining, and she slipped due to rain, and fell backward, and struck the back of the head and twisted her right ankle, she had immediate ankle pain; found to have bimalleolar fx, now s/p ORIF, NWB RLE; with past medical history of bipolar disorder, tremors, CHF, COPD, breast cancer   OT comments  Pt seen for second session this afternoon for family education. Reviewed transfer techniques for stand-pivot and lateral scoot transfer with pt/family. Pt still requiring heavy cuing for hand placement with transfers and gently lowering self onto surfaces when sitting. Educated pt/family on shower transfer when pt is ready, however pt states she will be bathing sinkside initially until she is more stable. Educated family on drop-arm [employer] use as well as LB dressing compensatory strategies for pt. Pt and family verbalized/demonstrated understanding. Pt presenting with impairments listed below, will continue to follow acutely. Recommend d/c home with assistance and HHOT.   Recommendations for follow up therapy are one component of a multi-disciplinary discharge planning process, led by the attending physician.  Recommendations may be updated based on patient status, additional functional criteria and insurance authorization.    Follow Up Recommendations  Home health OT    Assistance Recommended at Discharge Set up Supervision/Assistance  Patient can return home with the following  A lot of help with walking and/or transfers;Assistance with cooking/housework;Help with stairs or ramp for entrance;A lot of help with bathing/dressing/bathroom   Equipment  Recommendations  Wheelchair (measurements OT);Wheelchair cushion (measurements OT);Other (comment);Tub/shower seat    Recommendations for Other Services PT consult    Precautions / Restrictions Precautions Precautions: Fall;Other (comment) Precaution Comments: Covid Restrictions Weight Bearing Restrictions: Yes RLE Weight Bearing: Non weight bearing       Mobility Bed Mobility Overal bed mobility: Needs Assistance Bed Mobility: Supine to Sit;Sit to Supine     Supine to sit: Min guard Sit to supine: Min guard   General bed mobility comments: able to pull self up towards HOB once supine    Transfers Overall transfer level: Needs assistance Equipment used: Rolling walker (2 wheels) Transfers: Sit to/from Stand;Bed to chair/wheelchair/BSC Sit to Stand: Min assist Stand pivot transfers: Min assist        Lateral/Scoot Transfers: Min assist General transfer comment: increased cuing, pt sits prematurely     Balance Overall balance assessment: Needs assistance Sitting-balance support: Feet supported Sitting balance-Leahy Scale: Good     Standing balance support: Bilateral upper extremity supported;During functional activity;Reliant on assistive device for balance Standing balance-Leahy Scale: Poor                             ADL either performed or assessed with clinical judgement   ADL Overall ADL's : Needs assistance/impaired                 Upper Body Dressing : Supervision/safety;Sitting Upper Body Dressing Details (indicate cue type and reason): dons shirt sitting EOB Lower Body Dressing: Minimal assistance;Cueing for sequencing;Cueing for safety;Sitting/lateral leans;Bed level Lower Body Dressing Details (indicate cue type and reason): pt dons pants, cues to put RLE into pants first Toilet Transfer: BSC/3in1;Rolling walker (2 wheels);Stand-pivot;Minimal assistance  Toilet Transfer Details (indicate cue type and reason): cues for hand placement  prior to sitting Toileting- Clothing Manipulation and Hygiene: Supervision/safety;Sitting/lateral lean       Functional mobility during ADLs: Minimal assistance      Extremity/Trunk Assessment Upper Extremity Assessment Upper Extremity Assessment: Generalized weakness   Lower Extremity Assessment Lower Extremity Assessment: Defer to PT evaluation        Vision   Vision Assessment?: No apparent visual deficits   Perception Perception Perception: Not tested   Praxis Praxis Praxis: Not tested    Cognition Arousal/Alertness: Awake/alert Behavior During Therapy: WFL for tasks assessed/performed Overall Cognitive Status: Within Functional Limits for tasks assessed                                 General Comments: Noting pt can get overwhelmed with all the steps and mechanisms involved with wheelchair parts management and use of drop-arm BSC; will need reinforcement and recommend involving family in wheelchair training, DME training          Exercises     Shoulder Instructions       General Comments Family present, educated pt on transfer to [facility] as well as compensatory strategies for LB dressing. Discussed concerns regarding shower, and pt plans to wash up sinkside at d/c until stable enough to attempt shower transfer.    Pertinent Vitals/ Pain       Pain Assessment: No/denies pain  Home Living                                          Prior Functioning/Environment              Frequency  Min 2X/week        Progress Toward Goals  OT Goals(current goals can now be found in the care plan section)  Progress towards OT goals: Progressing toward goals  Acute Rehab OT Goals Patient Stated Goal: to go home OT Goal Formulation: With patient/family Time For Goal Achievement: 05/26/21 Potential to Achieve Goals: Good ADL Goals Pt Will Perform Lower Body Dressing: with min guard assist;sit to/from stand;sitting/lateral  leans;with adaptive equipment Pt Will Transfer to Toilet: with min guard assist;squat pivot transfer;bedside commode Pt Will Perform Tub/Shower Transfer: with min guard assist;ambulating;rolling walker;shower seat  Plan Discharge plan remains appropriate;Frequency remains appropriate    Co-evaluation                 AM-PAC OT "6 Clicks" Daily Activity     Outcome Measure   Help from another person eating meals?: None Help from another person taking care of personal grooming?: A Little Help from another person toileting, which includes using toliet, bedpan, or urinal?: A Little Help from another person bathing (including washing, rinsing, drying)?: A Lot Help from another person to put on and taking off regular upper body clothing?: A Little Help from another person to put on and taking off regular lower body clothing?: A Little 6 Click Score: 18    End of Session Equipment Utilized During Treatment: Gait belt;Rolling walker (2 wheels)  OT Visit Diagnosis: Repeated falls (R29.6);Muscle weakness (generalized) (M62.81);Other abnormalities of gait and mobility (R26.89);History of falling (Z91.81)   Activity Tolerance Patient tolerated treatment well;Patient limited by fatigue   Patient Left in bed;with bed alarm set;with call bell/phone within reach;with family/visitor present  Nurse Communication Mobility status        Time: 9458-5929 OT Time Calculation (min): 12 min  Charges: OT General Charges $OT Visit: 1 Visit OT Treatments $Self Care/Home Management : 8-22 mins  Lynnda Child, OTD, OTR/L Acute Rehab [phone] [place] 05/13/2021, 2:06 PM

## 2021-05-13 NOTE — Progress Notes (Signed)
Physical Therapy Note  Continuing to follow;  Pt is slower to progress with mobility and transfers than anticipated;  Lengthy discussion with pt yesterday, and connected with TOC RN this morning; Pt and her sister are unsure if they can meet her needs at home;   At this point, I think it is prudent to initiate pursuit of SNF for post-acute rehab to maximize independence and safety with mobility and ADLs;  Will monitor for progress and discuss progress and plans with pt and her sister;   Laurie Moreno, [place] Pager [phone] Office [phone]

## 2021-05-16 ENCOUNTER — Other Ambulatory Visit (HOSPITAL_COMMUNITY): Payer: Self-pay

## 2021-05-20 ENCOUNTER — Encounter (HOSPITAL_COMMUNITY): Payer: Self-pay | Admitting: Hematology

## 2021-05-22 ENCOUNTER — Other Ambulatory Visit (HOSPITAL_COMMUNITY): Payer: Self-pay

## 2021-05-22 ENCOUNTER — Telehealth (HOSPITAL_COMMUNITY): Payer: Self-pay | Admitting: Pharmacist

## 2021-05-22 NOTE — Telephone Encounter (Signed)
Pharmacy Transitions of Care Follow-up Telephone Call  Date of discharge: 05/13/21  Discharge Diagnosis: ankle fracture requiring DVT Prophylaxis  How have you been since you were released from the hospital? Patient was waiting for therapy at the time of call.  She asked me to speak with her sister, Fabio Bering, who is her caretaker.    Medication changes made at discharge:  - START: Enoxaparin daily  - STOPPED: n/a  - CHANGED: n/a  Medication changes verified by the patient? Yes    Medication Accessibility:  Home Pharmacy: not discussed   Was the patient provided with refills on discharged medications? no   Have all prescriptions been transferred from [facility] to home pharmacy? N/a      Medication Review:  ENOXAPARIN - Pts sister is administering this once daily - no missed doses - some minor bruising, possibly from injections but also from the belt used in physical therapy - no other bleeding issues - discussed that length of therapy is at least one month and for this to be discussed at upcoming appt with surgeon.  Follow-up Appointments:  Pt has follow-up scheduled with surgeon, is aware of date/time.   If their condition worsens, is the pt aware to call PCP or go to the Emergency Dept.? yes  Final Patient Assessment: Pt is doing well with help of her sister.  No significant issues but experiencing minor bruising.  No refills authorized, pt will discuss this at upcoming appt if necessary.

## 2021-05-27 ENCOUNTER — Inpatient Hospital Stay (HOSPITAL_COMMUNITY): Payer: Medicaid Other | Attending: Hematology | Admitting: Hematology

## 2021-05-27 ENCOUNTER — Other Ambulatory Visit: Payer: Self-pay

## 2021-05-27 ENCOUNTER — Encounter (HOSPITAL_COMMUNITY): Payer: Self-pay | Admitting: Hematology

## 2021-05-27 DIAGNOSIS — C50212 Malignant neoplasm of upper-inner quadrant of left female breast: Secondary | ICD-10-CM

## 2021-05-27 DIAGNOSIS — Z170 Estrogen receptor positive status [ER+]: Secondary | ICD-10-CM | POA: Diagnosis not present

## 2021-05-27 MED ORDER — LANREOTIDE ACETATE 120 MG/0.5ML ~~LOC~~ SOLN
SUBCUTANEOUS | Status: AC
  Filled 2021-05-27: qty 120

## 2021-05-27 NOTE — Progress Notes (Signed)
Virtual Visit via Telephone Note  I connected with Laurie Moreno on 05/27/21 at  4:00 PM EST by telephone and verified that I am speaking with the correct person using two identifiers.  Location: Patient: At home Provider: In office   I discussed the limitations, risks, security and privacy concerns of performing an evaluation and management service by telephone and the availability of in person appointments. I also discussed with the patient that there may be a patient responsible charge related to this service. The patient expressed understanding and agreed to proceed.   History of Present Illness: She is seen in our clinic for stage I left breast cancer, osteopenia and mild thrombocytopenia.   Observations/Objective: She reports that she fell during the last week of December at her doctor's office and broke her right ankle and underwent surgery.  She reports that she has been taking anastrozole without fail.  She is also taking calcium and vitamin D supplements.  Assessment and Plan:  1.  Stage I (T1BN0) left breast IDC, ER/PR positive, HER2 negative: - Left MRM and SLNB on 01/23/2019, 0.8 cm IDC, margins negative, grade 2 - Anastrozole started on 03/03/2019 - Mammogram from June 2022 was BI-RADS Category 2. - Reviewed labs from 04/30/2021 which showed normal LFTs and CBC. - Will RTC in 6 months with repeat mammogram of the right breast and labs.  2.  Osteopenia: - Reviewed results of DEXA scan results from 04/30/2021 which showed T score -2.4. - This is stable from prior bone density.  Vitamin D level is 46. - She has been receiving Prolia injections, last injection on 04/30/2021. - She is planning to have dental implants placed in the summer.  We will hold her next dose of Prolia until after the procedure.  3.  Thrombocytopenia: - She has cirrhosis with normal-sized spleen based on ultrasound from August 2020.  She has mild thrombocytopenia with platelet count 139 and stable.   Differential includes thrombocytopenia from liver disease versus immune mediated thrombocytopenia.   Follow Up Instructions: RTC 6 months with repeat labs and right mammogram.   I discussed the assessment and treatment plan with the patient. The patient was provided an opportunity to ask questions and all were answered. The patient agreed with the plan and demonstrated an understanding of the instructions.   The patient was advised to call back or seek an in-person evaluation if the symptoms worsen or if the condition fails to improve as anticipated.  I provided 22 minutes of non-face-to-face time during this encounter.   Derek Jack, MD

## 2021-05-28 ENCOUNTER — Other Ambulatory Visit (HOSPITAL_COMMUNITY): Payer: Self-pay

## 2021-05-28 DIAGNOSIS — Z1231 Encounter for screening mammogram for malignant neoplasm of breast: Secondary | ICD-10-CM

## 2021-05-28 DIAGNOSIS — Z170 Estrogen receptor positive status [ER+]: Secondary | ICD-10-CM

## 2021-05-28 DIAGNOSIS — C50212 Malignant neoplasm of upper-inner quadrant of left female breast: Secondary | ICD-10-CM

## 2021-05-28 NOTE — Progress Notes (Signed)
Message from Dr. Delton Coombes- Can you please order right breast screening mammogram for this patient after 10/28/2021.  Thanks.  Order placed per Dr. Tomie China request.

## 2021-05-30 ENCOUNTER — Telehealth (HOSPITAL_COMMUNITY): Payer: Self-pay

## 2021-05-30 NOTE — Telephone Encounter (Signed)
Transition Care Management Follow-up Telephone Call Date of discharge and from where: 05/13/2021  [facility] How have you been since you were released from the hospital? Doing well- having therapy Any questions or concerns? No  Items Reviewed: Did the pt receive and understand the discharge instructions provided? Yes  Medications obtained and verified? Yes  Other? No  Any new allergies since your discharge? No  Dietary orders reviewed? No Do you have support at home? Yes   Home Care and Equipment/Supplies: Were home health services ordered? yes If so, what is the name of the agency? [facility]  Has the agency set up a time to come to the patient's home? yes Were any new equipment or medical supplies ordered? yes What is the name of the medical supply agency? [facility]kknown Were you able to get the supplies/equipment? yes Do you have any questions related to the use of the equipment or supplies? No  Functional Questionnaire: (I = Independent and D = Dependent) ADLs: assist  Bathing/Dressing- assist  Meal Prep- assist  Eating- I  Maintaining continence- I  Transferring/Ambulation- walker and wheelchair  Managing Meds- assist  Follow up appointments reviewed:  PCP Hospital f/u appt confirmed? No  New PCP  [facility] f/u appt confirmed? Yes  Already seen ortho Are transportation arrangements needed? No  If their condition worsens, is the pt aware to call PCP or go to the Emergency Dept.? Yes Was the patient provided with contact information for the PCP's office or ED? Yes Was to pt encouraged to call back with questions or concerns? Yes  Tomasa Rand, RN, BSN, CEN [facility] [employer] [phone]

## 2021-06-26 ENCOUNTER — Other Ambulatory Visit: Payer: Self-pay | Admitting: Family Medicine

## 2021-06-26 ENCOUNTER — Other Ambulatory Visit (HOSPITAL_COMMUNITY): Payer: Self-pay | Admitting: Family Medicine

## 2021-06-26 DIAGNOSIS — Z720 Tobacco use: Secondary | ICD-10-CM

## 2021-07-30 ENCOUNTER — Encounter (HOSPITAL_COMMUNITY): Payer: Self-pay | Admitting: Hematology

## 2021-08-06 ENCOUNTER — Ambulatory Visit (HOSPITAL_COMMUNITY)
Admission: RE | Admit: 2021-08-06 | Discharge: 2021-08-06 | Disposition: A | Discharge status: Home / Self Care | Payer: Medicare Other | Source: Ambulatory Visit | Attending: Family Medicine | Admitting: Family Medicine

## 2021-08-06 DIAGNOSIS — Z122 Encounter for screening for malignant neoplasm of respiratory organs: Secondary | ICD-10-CM | POA: Diagnosis not present

## 2021-08-06 DIAGNOSIS — J439 Emphysema, unspecified: Secondary | ICD-10-CM | POA: Diagnosis not present

## 2021-08-06 DIAGNOSIS — K7460 Unspecified cirrhosis of liver: Secondary | ICD-10-CM | POA: Insufficient documentation

## 2021-08-06 DIAGNOSIS — Z720 Tobacco use: Secondary | ICD-10-CM

## 2021-08-06 DIAGNOSIS — F17210 Nicotine dependence, cigarettes, uncomplicated: Secondary | ICD-10-CM | POA: Insufficient documentation

## 2021-08-18 ENCOUNTER — Encounter: Payer: Self-pay | Admitting: Internal Medicine

## 2021-09-23 ENCOUNTER — Encounter: Payer: Self-pay | Admitting: Internal Medicine

## 2021-10-01 ENCOUNTER — Ambulatory Visit: Payer: Commercial Managed Care - HMO | Admitting: Gastroenterology

## 2021-10-15 ENCOUNTER — Other Ambulatory Visit: Payer: Self-pay | Admitting: Family Medicine

## 2021-10-15 DIAGNOSIS — J449 Chronic obstructive pulmonary disease, unspecified: Secondary | ICD-10-CM

## 2021-10-15 DIAGNOSIS — R918 Other nonspecific abnormal finding of lung field: Secondary | ICD-10-CM

## 2021-10-15 DIAGNOSIS — R0902 Hypoxemia: Secondary | ICD-10-CM

## 2021-10-22 ENCOUNTER — Ambulatory Visit (INDEPENDENT_AMBULATORY_CARE_PROVIDER_SITE_OTHER): Payer: Medicare Other | Admitting: Gastroenterology

## 2021-10-22 ENCOUNTER — Encounter: Payer: Self-pay | Admitting: Gastroenterology

## 2021-10-22 VITALS — BP 135/85 | HR 79 | Temp 97.7°F | Ht 64.0 in | Wt 161.8 lb

## 2021-10-22 DIAGNOSIS — K7460 Unspecified cirrhosis of liver: Secondary | ICD-10-CM

## 2021-10-22 DIAGNOSIS — K629 Disease of anus and rectum, unspecified: Secondary | ICD-10-CM

## 2021-10-22 DIAGNOSIS — K219 Gastro-esophageal reflux disease without esophagitis: Secondary | ICD-10-CM | POA: Diagnosis not present

## 2021-10-22 NOTE — Progress Notes (Signed)
GI Office Note    Referring Provider: Alfonse Flavors Primary Care Physician:  Derek Jack, MD  Primary Gastroenterologist: Elon Alas. Abbey Chatters, DO Michail Jewels DO at [facility]  Chief Complaint   Chief Complaint  Patient presents with   Follow-up    States there is a place on her rectum that Dr. Nevada Crane won't take it off that she wants referred to a surgeon for.     History of Present Illness   Laurie Moreno is a 71 y.o. 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  Last seen March 2022.  Patient missed her appointment back in January because of illness.  Hospitalized after accidental fall resulting in right ankle fracture requiring surgery.  Incidentally noted to have COVID-19 at that time as well.  She had an EGD and colonoscopy in June 2021.  She had mild portal hypertensive gastropathy, linear gastric erosions.  No varices.  Biopsy showed reactive gastropathy but no H. pylori.  Plans for repeat EGD in 2 years to screen for varices.  On colonoscopy she had 1.5 cm verrucous, raised nodule upper gluteal fold, scattered diverticula, 3 sessile tubular adenomas removed.  Plans for next colonoscopy in 5 years.  She was advised to see dermatology for the nodule noted near the upper end of the gluteal folds.  Right upper quadrant ultrasound July 2022: Cholelithiasis, cirrhotic liver but no focal hepatic lesion.  Today: Appetite good. No abdominal pain. BM regular. No melena, brbpr. No heartburn. States Dr. Nevada Crane gave her cream for the "wart". She did not use it much, got sick with UTI and stopped it. Lesion does not hurt or bleed. She got impression that Dr. [place] Crane did not want to remove it but mentioned sending her to a surgeon. She said she has not been given follow up appointment or referral.    States she goes for blood work June 30th with Dr. Delton Coombes. She wants to get our labs at the same time. She also updates her  mammogram that day. She states she had CT chest 08/2021 for lung cancer screening.  She has a lung RADS 4B, suspicious findings.  Multifocal consolidative and nodular airspace opacity in the left lower lobe.  Features could be secondary to infectious/inflammatory but given nodular component, close interval follow-up recommended for consultation with pulmonology or thoracic surgery.  She states she has another scan in July.  Arrangements being made by her PCP.     Medications   Current Outpatient Medications  Medication Sig Dispense Refill   albuterol (VENTOLIN HFA) 108 (90 Base) MCG/ACT inhaler Inhale 2 puffs into the lungs as needed for wheezing or shortness of breath.     anastrozole (ARIMIDEX) 1 MG tablet TAKE 1 TABLET BY MOUTH EVERY DAY 90 tablet 3   atorvastatin (LIPITOR) 40 MG tablet Take 40 mg by mouth daily.      Calcium Carbonate-Vit D-Min (CALCIUM 600+D3 PLUS MINERALS) 600-800 MG-UNIT CHEW Chew 1 tablet by mouth 2 (two) times daily.     carbidopa-levodopa (SINEMET IR) 25-100 MG tablet Take 1.5 tablets by mouth 3 (three) times daily.     carvedilol (COREG) 3.125 MG tablet Take 3.125 mg by mouth 2 (two) times daily with a meal.     Cholecalciferol (VITAMIN D) 50 MCG (2000 UT) tablet Take 2,000 Units by mouth daily.     clonazePAM (KLONOPIN) 0.5 MG tablet Take 1 tablet (0.5 mg total) by mouth 2 (two) times daily. (  Patient taking differently: Take 0.5 mg by mouth daily.) 60 tablet 0   Denosumab (PROLIA [place]) Inject into the skin every 6 (six) months.     divalproex (DEPAKOTE) 250 MG DR tablet Take 250 mg by mouth 3 (three) times daily. Do not crush     escitalopram (LEXAPRO) 20 MG tablet Take 20 mg by mouth daily.     fexofenadine (ALLEGRA) 180 MG tablet Take 180 mg by mouth daily.     fluticasone (FLONASE) 50 MCG/ACT nasal spray Place 2 sprays into both nostrils daily.     gabapentin (NEURONTIN) 300 MG capsule Take 300 mg by mouth at bedtime.     hydrOXYzine (ATARAX/VISTARIL) 25 MG tablet  Take 25 mg by mouth every 8 (eight) hours as needed for itching.      ipratropium-albuterol (DUONEB) 0.5-2.5 (3) MG/3ML SOLN SMARTSIG:3 Milliliter(s) Via Nebulizer Every 6 Hours PRN     levothyroxine (SYNTHROID, LEVOTHROID) 125 MCG tablet Take 125 mcg by mouth daily before breakfast.     lisinopril (ZESTRIL) 2.5 MG tablet Take 2.5 mg by mouth daily.     Multiple Vitamin (MULTIVITAMIN) tablet Take 1 tablet by mouth every morning.      oxybutynin (DITROPAN-XL) 5 MG 24 hr tablet Take 5 mg by mouth daily.     pantoprazole (PROTONIX) 40 MG tablet TAKE 1 TABLET BY MOUTH EVERY DAY BEFORE BREAKFAST (Patient taking differently: Take 40 mg by mouth daily.) 90 tablet 3   primidone (MYSOLINE) 50 MG tablet Take 100 mg by mouth 2 (two) times daily.      SPIRIVA HANDIHALER 18 MCG inhalation capsule 1 capsule daily.     SYMBICORT 160-4.5 MCG/ACT inhaler SMARTSIG:2 Puff(s) By Mouth Daily PRN     No current facility-administered medications for this visit.    Allergies   Allergies as of 10/22/2021 - Review Complete 10/22/2021  Allergen Reaction Noted   Codeine Nausea And Vomiting 04/02/2011   Penicillins Hives, Rash, and Other (See Comments) 04/02/2011      Review of Systems   General: Negative for anorexia, weight loss, fever, chills, fatigue, weakness. ENT: Negative for hoarseness, difficulty swallowing , nasal congestion. CV: Negative for chest pain, angina, palpitations, dyspnea on exertion, peripheral edema.  Respiratory: Negative for dyspnea at rest, dyspnea on exertion, cough, sputum, wheezing.  GI: See history of present illness. GU:  Negative for dysuria, hematuria, urinary incontinence, urinary frequency, nocturnal urination.  Endo: Negative for unusual weight change.     Physical Exam   BP 135/85 (BP Location: Right Arm, Patient Position: Sitting, Cuff Size: Normal)   Pulse 79   Temp 97.7 F (36.5 C) (Temporal)   Ht '5\' 4"'$  (1.626 m)   Wt 161 lb 12.8 oz (73.4 kg)   SpO2 94%   BMI  27.77 kg/m    General: elderly, chronically ill appearing female in no acute distress.  Eyes: No icterus. Mouth: Oropharyngeal mucosa moist and pink , no lesions erythema or exudate. Lungs: Clear to auscultation bilaterally.  Heart: Regular rate and rhythm, no murmurs rubs or gallops.  Abdomen: Bowel sounds are normal, nontender, nondistended, no hepatosplenomegaly or masses,  no abdominal bruits or hernia , no rebound or guarding.  Rectal: perianal lesion, smooth, hard, skin colored without erosions. Measuring 1.5cm.  Extremities: Trace lower extremity edema. No clubbing or deformities. Neuro: Alert and oriented x 4   Skin: Warm and dry, no jaundice.   Psych: Alert and cooperative, normal mood and affect.  Labs   Lab Results  Component Value Date  CREATININE 0.75 05/11/2021   BUN 15 05/11/2021   NA 139 05/11/2021   K 4.7 05/11/2021   CL 106 05/11/2021   CO2 26 05/11/2021   Lab Results  Component Value Date   WBC 8.6 05/11/2021   HGB 12.8 05/11/2021   HCT 38.9 05/11/2021   MCV 92.6 05/11/2021   PLT 139 (L) 05/11/2021   Lab Results  Component Value Date   ALT 9 04/30/2021   AST 17 04/30/2021   ALKPHOS 36 (L) 04/30/2021   BILITOT 0.6 04/30/2021   Lab Results  Component Value Date   INR 1.1 10/24/2020   INR 1.1 04/05/2020   INR 1.1 10/09/2019    Imaging Studies   No results found.  Assessment   GERD: well controlled on current regimen. Reinforced antireflux measures  Cirrhosis: will update labs for MELD and AFP with upcoming labs. Based on findings, will arrange for hepatoma screening. Due for screening for esophageal varices at any time. Will consider later this year, pending labs, pending Chest CT, etc.  Perianal lesion: untreated. She did not use cream provided on a regular basis. Not sure of plan, will request records. Patient would like definitive treatment if possible.   PLAN   Review records from dermatology, determine if general surgery referral  needed. Labs later this month as planned. Once labs available, if AFP normal, then RUQ U/S to be ordered for hepatoma screening.  EGD later this year for esophageal variceal screening. Chest CT next month as planned by PCP.     Laureen Ochs. Bobby Rumpf, [place], [facility]

## 2021-10-22 NOTE — Patient Instructions (Signed)
Please take our orders when you go for labs for Dr. Raliegh Ip.  We will order ultrasound of liver after results received.  I will obtain records from Dr. Nevada Crane for review and let you know next step for management of lesion on your bottom. Please complete Chest CT as planned by your PCP. Will consider updating your upper endoscopy later this year to screen for blood vessels around the esophagus called varices.  Return office visit to be determined after labs available.

## 2021-10-31 ENCOUNTER — Inpatient Hospital Stay (HOSPITAL_COMMUNITY): Payer: Medicare Other | Attending: Hematology

## 2021-10-31 ENCOUNTER — Other Ambulatory Visit (HOSPITAL_COMMUNITY)
Admission: RE | Admit: 2021-10-31 | Discharge: 2021-10-31 | Disposition: A | Discharge status: Home / Self Care | Payer: Medicare Other | Source: Home / Self Care

## 2021-10-31 ENCOUNTER — Ambulatory Visit (HOSPITAL_COMMUNITY)
Admission: RE | Admit: 2021-10-31 | Discharge: 2021-10-31 | Disposition: A | Discharge status: Home / Self Care | Payer: Medicare Other | Source: Ambulatory Visit | Attending: Hematology | Admitting: Hematology

## 2021-10-31 ENCOUNTER — Ambulatory Visit (HOSPITAL_COMMUNITY): Payer: Medicare Other

## 2021-10-31 DIAGNOSIS — Z1231 Encounter for screening mammogram for malignant neoplasm of breast: Secondary | ICD-10-CM | POA: Insufficient documentation

## 2021-10-31 DIAGNOSIS — Z170 Estrogen receptor positive status [ER+]: Secondary | ICD-10-CM | POA: Insufficient documentation

## 2021-10-31 DIAGNOSIS — C50212 Malignant neoplasm of upper-inner quadrant of left female breast: Secondary | ICD-10-CM | POA: Insufficient documentation

## 2021-10-31 DIAGNOSIS — M8580 Other specified disorders of bone density and structure, unspecified site: Secondary | ICD-10-CM | POA: Insufficient documentation

## 2021-10-31 DIAGNOSIS — K7460 Unspecified cirrhosis of liver: Secondary | ICD-10-CM | POA: Insufficient documentation

## 2021-10-31 DIAGNOSIS — E559 Vitamin D deficiency, unspecified: Secondary | ICD-10-CM

## 2021-10-31 DIAGNOSIS — K629 Disease of anus and rectum, unspecified: Secondary | ICD-10-CM | POA: Insufficient documentation

## 2021-10-31 DIAGNOSIS — K219 Gastro-esophageal reflux disease without esophagitis: Secondary | ICD-10-CM | POA: Insufficient documentation

## 2021-10-31 DIAGNOSIS — Z79811 Long term (current) use of aromatase inhibitors: Secondary | ICD-10-CM | POA: Diagnosis not present

## 2021-10-31 DIAGNOSIS — D696 Thrombocytopenia, unspecified: Secondary | ICD-10-CM | POA: Insufficient documentation

## 2021-10-31 LAB — CBC WITH DIFFERENTIAL/PLATELET
Abs Immature Granulocytes: 0.01 10*3/uL (ref 0.00–0.07)
Basophils Absolute: 0.1 10*3/uL (ref 0.0–0.1)
Basophils Relative: 1 %
Eosinophils Absolute: 0.1 10*3/uL (ref 0.0–0.5)
Eosinophils Relative: 2 %
HCT: 39.4 % (ref 36.0–46.0)
Hemoglobin: 13.5 g/dL (ref 12.0–15.0)
Immature Granulocytes: 0 %
Lymphocytes Relative: 50 %
Lymphs Abs: 2.8 10*3/uL (ref 0.7–4.0)
MCH: 30.4 pg (ref 26.0–34.0)
MCHC: 34.3 g/dL (ref 30.0–36.0)
MCV: 88.7 fL (ref 80.0–100.0)
Monocytes Absolute: 0.6 10*3/uL (ref 0.1–1.0)
Monocytes Relative: 11 %
Neutro Abs: 2 10*3/uL (ref 1.7–7.7)
Neutrophils Relative %: 36 %
Platelets: 134 10*3/uL — ABNORMAL LOW (ref 150–400)
RBC: 4.44 MIL/uL (ref 3.87–5.11)
RDW: 12.9 % (ref 11.5–15.5)
WBC: 5.5 10*3/uL (ref 4.0–10.5)
nRBC: 0 % (ref 0.0–0.2)

## 2021-10-31 LAB — COMPREHENSIVE METABOLIC PANEL
ALT: 6 U/L (ref 0–44)
AST: 22 U/L (ref 15–41)
Albumin: 4.1 g/dL (ref 3.5–5.0)
Alkaline Phosphatase: 38 U/L (ref 38–126)
Anion gap: 8 (ref 5–15)
BUN: 24 mg/dL — ABNORMAL HIGH (ref 8–23)
CO2: 25 mmol/L (ref 22–32)
Calcium: 9.1 mg/dL (ref 8.9–10.3)
Chloride: 104 mmol/L (ref 98–111)
Creatinine, Ser: 0.7 mg/dL (ref 0.44–1.00)
GFR, Estimated: >60 mL/min (ref >60)
Glucose, Bld: 108 mg/dL — ABNORMAL HIGH (ref 70–99)
Potassium: 4.5 mmol/L (ref 3.5–5.1)
Sodium: 137 mmol/L (ref 135–145)
Total Bilirubin: 0.9 mg/dL (ref 0.3–1.2)
Total Protein: 6.8 g/dL (ref 6.5–8.1)

## 2021-10-31 LAB — VITAMIN D 25 HYDROXY (VIT D DEFICIENCY, FRACTURES): Vit D, 25-Hydroxy: 34.18 ng/mL (ref 30–100)

## 2021-10-31 LAB — PROTIME-INR
INR: 1.2 (ref 0.8–1.2)
Prothrombin Time: 14.6 seconds (ref 11.4–15.2)

## 2021-11-01 LAB — AFP TUMOR MARKER: AFP, Serum, Tumor Marker: <1.8 ng/mL (ref 0.0–9.2)

## 2021-11-10 ENCOUNTER — Inpatient Hospital Stay (HOSPITAL_COMMUNITY): Payer: Medicare Other | Attending: Hematology | Admitting: Hematology

## 2021-11-10 ENCOUNTER — Inpatient Hospital Stay (HOSPITAL_COMMUNITY): Payer: Medicare Other

## 2021-11-10 VITALS — BP 136/81 | HR 79 | Temp 98.4°F | Resp 18 | Wt 159.0 lb

## 2021-11-10 DIAGNOSIS — Z170 Estrogen receptor positive status [ER+]: Secondary | ICD-10-CM | POA: Diagnosis not present

## 2021-11-10 DIAGNOSIS — D696 Thrombocytopenia, unspecified: Secondary | ICD-10-CM | POA: Diagnosis not present

## 2021-11-10 DIAGNOSIS — C50212 Malignant neoplasm of upper-inner quadrant of left female breast: Secondary | ICD-10-CM | POA: Diagnosis present

## 2021-11-10 DIAGNOSIS — M810 Age-related osteoporosis without current pathological fracture: Secondary | ICD-10-CM | POA: Diagnosis not present

## 2021-11-10 DIAGNOSIS — Z79811 Long term (current) use of aromatase inhibitors: Secondary | ICD-10-CM | POA: Diagnosis not present

## 2021-11-10 DIAGNOSIS — E559 Vitamin D deficiency, unspecified: Secondary | ICD-10-CM | POA: Insufficient documentation

## 2021-11-10 DIAGNOSIS — M8580 Other specified disorders of bone density and structure, unspecified site: Secondary | ICD-10-CM

## 2021-11-10 MED ORDER — DENOSUMAB 60 MG/ML ~~LOC~~ SOSY
60.0000 mg | PREFILLED_SYRINGE | Freq: Once | SUBCUTANEOUS | Status: AC
  Administered 2021-11-10: 60 mg via SUBCUTANEOUS
  Filled 2021-11-10: qty 1

## 2021-11-10 NOTE — Patient Instructions (Signed)
[place]  Discharge Instructions: Thank you for choosing [facility] to provide your oncology and hematology care.  If you have a lab appointment with the [facility], please come in thru the Main Entrance and check in at the main information desk.  Wear comfortable clothing and clothing appropriate for easy access to any Portacath or PICC line.   We strive to give you quality time with your provider. You may need to reschedule your appointment if you arrive late (15 or more minutes).  Arriving late affects you and other patients whose appointments are after yours.  Also, if you miss three or more appointments without notifying the office, you may be dismissed from the clinic at the provider's discretion.      For prescription refill requests, have your pharmacy contact our office and allow 72 hours for refills to be completed.    Today you received the following chemotherapy and/or immunotherapy agents Prolia      To help prevent nausea and vomiting after your treatment, we encourage you to take your nausea medication as directed.  BELOW ARE SYMPTOMS THAT SHOULD BE REPORTED IMMEDIATELY: *FEVER GREATER THAN 100.4 F (38 C) OR HIGHER *CHILLS OR SWEATING *NAUSEA AND VOMITING THAT IS NOT CONTROLLED WITH YOUR NAUSEA MEDICATION *UNUSUAL SHORTNESS OF BREATH *UNUSUAL BRUISING OR BLEEDING *URINARY PROBLEMS (pain or burning when urinating, or frequent urination) *BOWEL PROBLEMS (unusual diarrhea, constipation, pain near the anus) TENDERNESS IN MOUTH AND THROAT WITH OR WITHOUT PRESENCE OF ULCERS (sore throat, sores in mouth, or a toothache) UNUSUAL RASH, SWELLING OR PAIN  UNUSUAL VAGINAL DISCHARGE OR ITCHING   Items with * indicate a potential emergency and should be followed up as soon as possible or go to the Emergency Department if any problems should occur.  Please show the CHEMOTHERAPY ALERT CARD or IMMUNOTHERAPY ALERT CARD at check-in to the Emergency  Department and triage nurse.  Should you have questions after your visit or need to cancel or reschedule your appointment, please contact [facility] [phone]  and follow the prompts.  Office hours are 8:00 a.m. to 4:30 p.m. Monday - Friday. Please note that voicemails left after 4:00 p.m. may not be returned until the following business day.  We are closed weekends and major holidays. You have access to a nurse at all times for urgent questions. Please call the main number to the clinic [phone] and follow the prompts.  For any non-urgent questions, you may also contact your provider using MyChart. We now offer e-Visits for anyone 52 and older to request care online for non-urgent symptoms. For details visit [email or web address].   Also download the MyChart app! Go to the app store, search "MyChart", open the app, select [facility], and log in with your MyChart username and password.  Masks are optional in the cancer centers. If you would like for your care team to wear a mask while they are taking care of you, please let them know. For doctor visits, patients may have with them one support person who is at least 71 years old. At this time, visitors are not allowed in the infusion area.

## 2021-11-10 NOTE — Patient Instructions (Addendum)
[place] at [facility] Discharge Instructions  You were seen and examined today by Dr. Delton Coombes.  Dr. Delton Coombes discussed your most recent lab work and mammogram everything looks good. Continue taking Anastrozole as prescribed.   Follow-up as scheduled in 6 months.    Thank you for choosing [facility] to provide your oncology and hematology care.  To afford each patient quality time with our provider, please arrive at least 15 minutes before your scheduled appointment time.   If you have a lab appointment with the [facility] please come in thru the Main Entrance and check in at the main information desk.  You need to re-schedule your appointment should you arrive 10 or more minutes late.  We strive to give you quality time with our providers, and arriving late affects you and other patients whose appointments are after yours.  Also, if you no show three or more times for appointments you may be dismissed from the clinic at the providers discretion.     Again, thank you for choosing [facility].  Our hope is that these requests will decrease the amount of time that you wait before being seen by our physicians.       _____________________________________________________________  Should you have questions after your visit to [facility], please contact our office at [phone] and follow the prompts.  Our office hours are 8:00 a.m. and 4:30 p.m. Monday - Friday.  Please note that voicemails left after 4:00 p.m. may not be returned until the following business day.  We are closed weekends and major holidays.  You do have access to a nurse 24-7, just call the main number to the clinic [phone] and do not press any options, hold on the line and a nurse will answer the phone.    For prescription refill requests, have your pharmacy contact our office and allow 72 hours.    Due to Covid, you will  need to wear a mask upon entering the hospital. If you do not have a mask, a mask will be given to you at the Main Entrance upon arrival. For doctor visits, patients may have 1 support person age 40 or older with them. For treatment visits, patients can not have anyone with them due to social distancing guidelines and our immunocompromised population.

## 2021-11-10 NOTE — Progress Notes (Signed)
Laurie Moreno presents today for Prolia injection per the provider's orders.  Stable during administration without incident; injection site WNL; see MAR for injection details.  Patient tolerated procedure well and without incident.  No questions or complaints noted at this time. Discharge from clinic ambulatory in stable condition.  Alert and oriented X 3.  Follow up with [facility] as scheduled.

## 2021-11-10 NOTE — Progress Notes (Signed)
Laurie Moreno [address], [place] [address]   Patient Care Team: Doreatha Massed, MD as PCP - General (Hematology) West Bali, MD (Inactive) as Consulting Physician (Gastroenterology)  SUMMARY OF ONCOLOGIC HISTORY: Oncology History  Breast cancer [facility])  11/28/2018 Initial Diagnosis   Breast cancer (HCC)   02/23/2019 Cancer Staging   Staging form: Breast, AJCC 8th Edition - Clinical stage from 02/23/2019: Stage IA (cT1b, cN0(sn), cM0, G2, ER+, PR+, HER2-) - Signed by Doreatha Massed, MD on 02/23/2019     CHIEF COMPLIANT: Follow-up for left breast cancer   INTERVAL HISTORY: Laurie Moreno is a 71 y.o. female here today for follow up of her left breast cancer. Her last visit was on 06/27/2019.   Today Laurie Moreno reports feeling good. Laurie Moreno is taking anastrozole. Laurie Moreno reports occasional hot flashes. Laurie Moreno reports knots on her back which have been present since her mastectomy.   REVIEW OF SYSTEMS:   Review of Systems  Constitutional:  Negative for appetite change and fatigue.  Endocrine: Positive for hot flashes.  Psychiatric/Behavioral:  Positive for depression and sleep disturbance. The patient is nervous/anxious.   All other systems reviewed and are negative.   I have reviewed the past medical history, past surgical history, social history and family history with the patient and they are unchanged from previous note.   ALLERGIES:   is allergic to codeine and penicillins.   MEDICATIONS:  Current Outpatient Medications  Medication Sig Dispense Refill   albuterol (VENTOLIN HFA) 108 (90 Base) MCG/ACT inhaler Inhale 2 puffs into the lungs as needed for wheezing or shortness of breath.     anastrozole (ARIMIDEX) 1 MG tablet TAKE 1 TABLET BY MOUTH EVERY DAY 90 tablet 3   atorvastatin (LIPITOR) 40 MG tablet Take 40 mg by mouth daily.      Calcium Carbonate-Vit D-Min (CALCIUM 600+D3 PLUS MINERALS) 600-800 MG-UNIT CHEW Chew 1 tablet by mouth 2 (two)  times daily.     carbidopa-levodopa (SINEMET IR) 25-100 MG tablet Take 1.5 tablets by mouth 3 (three) times daily.     carvedilol (COREG) 3.125 MG tablet Take 3.125 mg by mouth 2 (two) times daily with a meal.     Cholecalciferol (VITAMIN D) 50 MCG (2000 UT) tablet Take 2,000 Units by mouth daily.     clonazePAM (KLONOPIN) 0.5 MG tablet Take 1 tablet (0.5 mg total) by mouth 2 (two) times daily. (Patient taking differently: Take 0.5 mg by mouth daily.) 60 tablet 0   Denosumab (PROLIA [place]) Inject into the skin every 6 (six) months.     divalproex (DEPAKOTE) 250 MG DR tablet Take 250 mg by mouth 3 (three) times daily. Do not crush     escitalopram (LEXAPRO) 20 MG tablet Take 20 mg by mouth daily.     fexofenadine (ALLEGRA) 180 MG tablet Take 180 mg by mouth daily.     fluticasone (FLONASE) 50 MCG/ACT nasal spray Place 2 sprays into both nostrils daily.     gabapentin (NEURONTIN) 300 MG capsule Take 300 mg by mouth at bedtime.     hydrOXYzine (ATARAX/VISTARIL) 25 MG tablet Take 25 mg by mouth every 8 (eight) hours as needed for itching.      ipratropium-albuterol (DUONEB) 0.5-2.5 (3) MG/3ML SOLN SMARTSIG:3 Milliliter(s) Via Nebulizer Every 6 Hours PRN     levothyroxine (SYNTHROID, LEVOTHROID) 125 MCG tablet Take 125 mcg by mouth daily before breakfast.     lisinopril (ZESTRIL) 2.5 MG tablet Take 2.5 mg by mouth daily.  Multiple Vitamin (MULTIVITAMIN) tablet Take 1 tablet by mouth every morning.      oxybutynin (DITROPAN-XL) 5 MG 24 hr tablet Take 5 mg by mouth daily.     pantoprazole (PROTONIX) 40 MG tablet TAKE 1 TABLET BY MOUTH EVERY DAY BEFORE BREAKFAST (Patient taking differently: Take 40 mg by mouth daily.) 90 tablet 3   primidone (MYSOLINE) 50 MG tablet Take 100 mg by mouth 2 (two) times daily.      SPIRIVA HANDIHALER 18 MCG inhalation capsule 1 capsule daily.     SYMBICORT 160-4.5 MCG/ACT inhaler SMARTSIG:2 Puff(s) By Mouth Daily PRN     No current facility-administered medications for  this visit.     PHYSICAL EXAMINATION: Performance status (ECOG): 1 - Symptomatic but completely ambulatory  There were no vitals filed for this visit. Wt Readings from Last 3 Encounters:  10/22/21 161 lb 12.8 oz (73.4 kg)  05/07/21 165 lb (74.8 kg)  04/30/21 155 lb 12.8 oz (70.7 kg)   Physical Exam Vitals reviewed.  Constitutional:      Appearance: Normal appearance.  Cardiovascular:     Rate and Rhythm: Normal rate and regular rhythm.     Pulses: Normal pulses.     Heart sounds: Normal heart sounds.  Pulmonary:     Effort: Pulmonary effort is normal.     Breath sounds: Normal breath sounds.  Chest:  Breasts:    Right: Normal. No swelling, bleeding, inverted nipple, mass, nipple discharge, skin change or tenderness.     Left: Absent. No swelling, bleeding, mass, skin change or tenderness.  Abdominal:     Palpations: Abdomen is soft. There is no hepatomegaly or mass.     Tenderness: There is no abdominal tenderness.  Musculoskeletal:     Right lower leg: No edema.     Left lower leg: No edema.  Lymphadenopathy:     Upper Body:     Right upper body: No supraclavicular or axillary adenopathy.     Left upper body: No supraclavicular or axillary adenopathy.  Neurological:     General: No focal deficit present.     Mental Status: Laurie Moreno is alert and oriented to person, place, and time.  Psychiatric:        Mood and Affect: Mood normal.        Behavior: Behavior normal.     Breast Exam Chaperone: Thana Ates     LABORATORY DATA:  I have reviewed the data as listed    Latest Ref Rng & Units 10/31/2021    9:07 AM 05/11/2021    3:35 AM 05/09/2021    5:58 AM  CMP  Glucose 70 - 99 mg/dL 108  154  138   BUN 8 - 23 mg/dL $Remove'24  15  17   'tobWVQg$ Creatinine 0.44 - 1.00 mg/dL 0.70  0.75  0.67   Sodium 135 - 145 mmol/L 137  139  140   Potassium 3.5 - 5.1 mmol/L 4.5  4.7  4.9   Chloride 98 - 111 mmol/L 104  106  104   CO2 22 - 32 mmol/L $RemoveB'25  26  27   'rGCpSTTr$ Calcium 8.9 - 10.3 mg/dL 9.1  8.8  7.8    Total Protein 6.5 - 8.1 g/dL 6.8     Total Bilirubin 0.3 - 1.2 mg/dL 0.9     Alkaline Phos 38 - 126 U/L 38     AST 15 - 41 U/L 22     ALT 0 - 44 U/L 6  No results found for: "CAN153" Lab Results  Component Value Date   WBC 5.5 10/31/2021   HGB 13.5 10/31/2021   HCT 39.4 10/31/2021   MCV 88.7 10/31/2021   PLT 134 (L) 10/31/2021   NEUTROABS 2.0 10/31/2021    ASSESSMENT:  1.  Stage Ia (T1BN0) left breast IDC, ER/PR positive, HER-2 negative: -Left modified radical mastectomy and SLNB on 01/23/2019, 0.8 cm IDC, margins negative, grade 2. -Anastrozole started on 03/03/2019.   2.  Thrombocytopenia: -Laurie Moreno has nodular liver consistent with mild cirrhosis from April 2019.  Spleen was normal. -Laurie Moreno has thrombocytopenia ranging between 78-2 72 since 2019.  Ultrasound of the abdomen on 12/29/2018 showed normal-sized spleen. -Differential diagnosis includes thrombocytopenia from liver disease versus ITP.  Latest platelet count is 110.  No bleeding issues.   3.  Osteopenia: -DEXA scan from 03/23/2019 shows T score of -2.4. -Prolia was started on 04/04/2019.   4.  Tobacco abuse: -Laurie Moreno smokes half pack of cigarettes per day for the past 45 years.  Unfortunately Laurie Moreno does not qualify for low-dose CT.   PLAN:  1.  Stage Ia (T1BN0) left breast IDC, ER/PR positive, HER-2 negative: - Physical exam today shows left mastectomy site within normal limits.  Right breast has no palpable masses. - Laurie Moreno has small subcutaneous nodules in the left mid back region which are stable. - Laurie Moreno is tolerating anastrozole except that Laurie Moreno has some hot flashes. - Reviewed labs which showed normal LFTs and CBC. - Right breast mammogram (10/31/2021) was BI-RADS Category 1 - RTC 6 months for follow-up with repeat labs.    2.  Vitamin D deficiency: - Vitamin D level is 34.  Continue vitamin D supplements.    3.  Thrombocytopenia: - Mild thrombocytopenia with platelet count 134 stable.     Breast Cancer therapy  associated bone loss: I have recommended calcium, Vitamin D and weight bearing exercises.  Orders placed this encounter:  No orders of the defined types were placed in this encounter.   The patient has a good understanding of the overall plan. Laurie Moreno agrees with it. Laurie Moreno will call with any problems that may develop before the next visit here.  Derek Jack, MD [facility] [phone]   I, Thana Ates, am acting as a scribe for Dr. Derek Jack.  I, Derek Jack MD, have reviewed the above documentation for accuracy and completeness, and I agree with the above.

## 2021-11-19 ENCOUNTER — Telehealth: Payer: Self-pay | Admitting: Internal Medicine

## 2021-11-19 ENCOUNTER — Other Ambulatory Visit: Payer: Self-pay | Admitting: *Deleted

## 2021-11-19 DIAGNOSIS — K7460 Unspecified cirrhosis of liver: Secondary | ICD-10-CM

## 2021-11-19 NOTE — Telephone Encounter (Signed)
Dr Nevada Crane (dermatology) office called to say that would not release records on patient without a signed release of information. I told them that it was for continuity of care and our PA needed the records ASAP. They will not release records to me. I have mailed patient a release of information form to sign and mail back to me.

## 2021-11-19 NOTE — Telephone Encounter (Signed)
Noted  

## 2021-11-20 NOTE — Telephone Encounter (Signed)
Noted  

## 2021-11-27 ENCOUNTER — Ambulatory Visit (HOSPITAL_COMMUNITY)
Admission: RE | Admit: 2021-11-27 | Discharge: 2021-11-27 | Disposition: A | Discharge status: Home / Self Care | Payer: Medicare Other | Source: Ambulatory Visit | Attending: Gastroenterology | Admitting: Gastroenterology

## 2021-11-27 DIAGNOSIS — K7460 Unspecified cirrhosis of liver: Secondary | ICD-10-CM | POA: Insufficient documentation

## 2021-12-01 ENCOUNTER — Other Ambulatory Visit (HOSPITAL_COMMUNITY): Payer: Self-pay | Admitting: Family Medicine

## 2021-12-01 DIAGNOSIS — J449 Chronic obstructive pulmonary disease, unspecified: Secondary | ICD-10-CM

## 2021-12-01 DIAGNOSIS — R918 Other nonspecific abnormal finding of lung field: Secondary | ICD-10-CM

## 2021-12-01 DIAGNOSIS — R0902 Hypoxemia: Secondary | ICD-10-CM

## 2021-12-10 ENCOUNTER — Telehealth: Payer: Self-pay

## 2021-12-10 NOTE — Telephone Encounter (Signed)
Requested records from Dr. Nevada Crane [facility] are scanned under media in the patient's chart for review.

## 2021-12-11 NOTE — Telephone Encounter (Signed)
Records have been scanned into patient's chart.

## 2021-12-15 NOTE — Telephone Encounter (Signed)
Addressed. See ruq u/s result note.

## 2022-01-12 ENCOUNTER — Ambulatory Visit (HOSPITAL_COMMUNITY)
Admission: RE | Admit: 2022-01-12 | Discharge: 2022-01-12 | Disposition: A | Discharge status: Home / Self Care | Payer: Medicare Other | Source: Ambulatory Visit | Attending: Family Medicine | Admitting: Family Medicine

## 2022-01-12 DIAGNOSIS — Z122 Encounter for screening for malignant neoplasm of respiratory organs: Secondary | ICD-10-CM | POA: Insufficient documentation

## 2022-01-12 DIAGNOSIS — R918 Other nonspecific abnormal finding of lung field: Secondary | ICD-10-CM | POA: Insufficient documentation

## 2022-01-12 DIAGNOSIS — R911 Solitary pulmonary nodule: Secondary | ICD-10-CM | POA: Insufficient documentation

## 2022-01-12 DIAGNOSIS — F17210 Nicotine dependence, cigarettes, uncomplicated: Secondary | ICD-10-CM | POA: Diagnosis not present

## 2022-01-12 DIAGNOSIS — R0902 Hypoxemia: Secondary | ICD-10-CM | POA: Insufficient documentation

## 2022-01-12 DIAGNOSIS — J449 Chronic obstructive pulmonary disease, unspecified: Secondary | ICD-10-CM | POA: Diagnosis present

## 2022-01-22 ENCOUNTER — Other Ambulatory Visit (HOSPITAL_COMMUNITY): Payer: Self-pay | Admitting: Hematology

## 2022-01-22 ENCOUNTER — Other Ambulatory Visit: Payer: Self-pay | Admitting: *Deleted

## 2022-01-22 DIAGNOSIS — Z170 Estrogen receptor positive status [ER+]: Secondary | ICD-10-CM

## 2022-01-22 NOTE — Telephone Encounter (Signed)
Anastrozole refill approved.  Patient is tolerating and is to continue therapy.  

## 2022-03-11 ENCOUNTER — Encounter: Payer: Self-pay | Admitting: Internal Medicine

## 2022-05-13 ENCOUNTER — Inpatient Hospital Stay: Payer: 59 | Attending: Hematology

## 2022-05-13 DIAGNOSIS — E559 Vitamin D deficiency, unspecified: Secondary | ICD-10-CM | POA: Diagnosis not present

## 2022-05-13 DIAGNOSIS — C50212 Malignant neoplasm of upper-inner quadrant of left female breast: Secondary | ICD-10-CM | POA: Diagnosis present

## 2022-05-13 DIAGNOSIS — D696 Thrombocytopenia, unspecified: Secondary | ICD-10-CM | POA: Insufficient documentation

## 2022-05-13 DIAGNOSIS — Z170 Estrogen receptor positive status [ER+]: Secondary | ICD-10-CM

## 2022-05-13 LAB — COMPREHENSIVE METABOLIC PANEL
ALT: 11 U/L (ref 0–44)
AST: 21 U/L (ref 15–41)
Albumin: 4 g/dL (ref 3.5–5.0)
Alkaline Phosphatase: 35 U/L — ABNORMAL LOW (ref 38–126)
Anion gap: 6 (ref 5–15)
BUN: 18 mg/dL (ref 8–23)
CO2: 26 mmol/L (ref 22–32)
Calcium: 9.1 mg/dL (ref 8.9–10.3)
Chloride: 104 mmol/L (ref 98–111)
Creatinine, Ser: 0.74 mg/dL (ref 0.44–1.00)
GFR, Estimated: >60 mL/min (ref >60)
Glucose, Bld: 104 mg/dL — ABNORMAL HIGH (ref 70–99)
Potassium: 4.2 mmol/L (ref 3.5–5.1)
Sodium: 136 mmol/L (ref 135–145)
Total Bilirubin: 0.9 mg/dL (ref 0.3–1.2)
Total Protein: 6.6 g/dL (ref 6.5–8.1)

## 2022-05-13 LAB — CBC WITH DIFFERENTIAL/PLATELET
Abs Immature Granulocytes: 0.01 10*3/uL (ref 0.00–0.07)
Basophils Absolute: 0.1 10*3/uL (ref 0.0–0.1)
Basophils Relative: 1 %
Eosinophils Absolute: 0.1 10*3/uL (ref 0.0–0.5)
Eosinophils Relative: 1 %
HCT: 41.7 % (ref 36.0–46.0)
Hemoglobin: 14 g/dL (ref 12.0–15.0)
Immature Granulocytes: 0 %
Lymphocytes Relative: 47 %
Lymphs Abs: 2.9 10*3/uL (ref 0.7–4.0)
MCH: 30.4 pg (ref 26.0–34.0)
MCHC: 33.6 g/dL (ref 30.0–36.0)
MCV: 90.5 fL (ref 80.0–100.0)
Monocytes Absolute: 0.6 10*3/uL (ref 0.1–1.0)
Monocytes Relative: 10 %
Neutro Abs: 2.5 10*3/uL (ref 1.7–7.7)
Neutrophils Relative %: 41 %
Platelets: 139 10*3/uL — ABNORMAL LOW (ref 150–400)
RBC: 4.61 MIL/uL (ref 3.87–5.11)
RDW: 12 % (ref 11.5–15.5)
WBC: 6.1 10*3/uL (ref 4.0–10.5)
nRBC: 0 % (ref 0.0–0.2)

## 2022-05-15 LAB — MISC LABCORP TEST (SEND OUT): Labcorp test code: 81950

## 2022-05-20 ENCOUNTER — Inpatient Hospital Stay: Payer: 59 | Admitting: Hematology

## 2022-05-20 ENCOUNTER — Inpatient Hospital Stay: Payer: 59

## 2022-05-28 ENCOUNTER — Encounter (HOSPITAL_COMMUNITY): Payer: Self-pay | Admitting: Hematology

## 2022-06-05 ENCOUNTER — Encounter (HOSPITAL_COMMUNITY): Payer: Self-pay | Admitting: Hematology

## 2022-06-08 ENCOUNTER — Ambulatory Visit (INDEPENDENT_AMBULATORY_CARE_PROVIDER_SITE_OTHER): Payer: 59 | Admitting: Gastroenterology

## 2022-06-08 ENCOUNTER — Encounter: Payer: Self-pay | Admitting: Gastroenterology

## 2022-06-08 VITALS — BP 116/77 | HR 86 | Temp 98.1°F | Ht 64.0 in | Wt 158.8 lb

## 2022-06-08 DIAGNOSIS — K7460 Unspecified cirrhosis of liver: Secondary | ICD-10-CM

## 2022-06-08 DIAGNOSIS — K219 Gastro-esophageal reflux disease without esophagitis: Secondary | ICD-10-CM | POA: Diagnosis not present

## 2022-06-08 NOTE — Progress Notes (Signed)
GI Office Note    Referring Provider: Derek Jack, MD Primary Care Physician:  Derek Jack, MD  Primary Gastroenterologist: Dr. Abbey Chatters PCP: Mina Marble DO at [facility]  Chief Complaint   Chief Complaint  Patient presents with   Follow-up    Doing well    History of Present Illness   Laurie Moreno is a 72 y.o. female presenting today for follow-up.  She was last seen in June 2023.  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  Right upper quadrant ultrasound July 2023: -5.8 mm polyp in the gallbladder -Increased echogenicity in the liver nonspecific but may be due to reported cirrhosis.  I reviewed dermatology records regarding perianal lesion.  She was given Aldara cream to apply to the lesion.  Patient reports she wanted to try the cream more consistently but if it was not effective that she wanted to pursue general surgery consult.  Labs from June 2023: MELD sodium 9, LFTs normal, AFP normal, stable mild thrombocytopenia.  She had an EGD and colonoscopy in June 2021.  She had mild portal hypertensive gastropathy, linear gastric erosions.  No varices.  Biopsy showed reactive gastropathy but no H. pylori.  Plans for repeat EGD in 2 years to screen for varices.  On colonoscopy she had 1.5 cm verrucous, raised nodule upper gluteal fold, scattered diverticula, 3 sessile tubular adenomas removed.  Plans for next colonoscopy in 5 years.  She was advised to see dermatology for the nodule noted near the upper end of the gluteal folds.      Medications   Current Outpatient Medications  Medication Sig Dispense Refill   acetaminophen (TYLENOL) 500 MG tablet Take by mouth.     albuterol (VENTOLIN HFA) 108 (90 Base) MCG/ACT inhaler Inhale 2 puffs into the lungs as needed for wheezing or shortness of breath.     anastrozole (ARIMIDEX) 1 MG tablet TAKE 1 TABLET BY MOUTH EVERY DAY 90 tablet 3   atorvastatin  (LIPITOR) 40 MG tablet Take 40 mg by mouth daily.      Calcium Carbonate-Vit D-Min (CALCIUM 600+D3 PLUS MINERALS) 600-800 MG-UNIT CHEW Chew 1 tablet by mouth 2 (two) times daily.     carbidopa-levodopa (SINEMET IR) 25-100 MG tablet Take 1.5 tablets by mouth 3 (three) times daily.     Cholecalciferol (VITAMIN D) 50 MCG (2000 UT) tablet Take 2,000 Units by mouth daily.     clonazePAM (KLONOPIN) 0.5 MG tablet Take 1 tablet (0.5 mg total) by mouth 2 (two) times daily. (Patient taking differently: Take 0.5 mg by mouth 2 (two) times daily as needed.) 60 tablet 0   Denosumab (PROLIA [place]) Inject into the skin every 6 (six) months.     divalproex (DEPAKOTE) 250 MG DR tablet Take 250 mg by mouth 3 (three) times daily. Do not crush     escitalopram (LEXAPRO) 20 MG tablet Take 20 mg by mouth daily.     fluticasone (FLONASE) 50 MCG/ACT nasal spray Place 2 sprays into both nostrils daily.     gabapentin (NEURONTIN) 300 MG capsule Take 300 mg by mouth at bedtime.     hydrOXYzine (ATARAX/VISTARIL) 25 MG tablet Take 25 mg by mouth every 8 (eight) hours as needed for itching.      levothyroxine (SYNTHROID, LEVOTHROID) 125 MCG tablet Take 125 mcg by mouth daily before breakfast.     lisinopril (ZESTRIL) 2.5 MG tablet Take 2.5 mg by mouth daily.  metoprolol tartrate (LOPRESSOR) 25 MG tablet 1/2 tablet with food Orally Twice a day for 90 days     Multiple Vitamin (MULTIVITAMIN) tablet Take 1 tablet by mouth every morning.      oxybutynin (DITROPAN-XL) 5 MG 24 hr tablet Take 5 mg by mouth daily.     pantoprazole (PROTONIX) 40 MG tablet TAKE 1 TABLET BY MOUTH EVERY DAY BEFORE BREAKFAST (Patient taking differently: Take 40 mg by mouth daily.) 90 tablet 3   primidone (MYSOLINE) 50 MG tablet Take 100 mg by mouth 2 (two) times daily.      SPIRIVA HANDIHALER 18 MCG inhalation capsule 1 capsule daily.     SYMBICORT 160-4.5 MCG/ACT inhaler SMARTSIG:2 Puff(s) By Mouth Daily PRN     No current facility-administered  medications for this visit.    Allergies   Allergies as of 06/08/2022 - Review Complete 06/08/2022  Allergen Reaction Noted   Codeine Nausea And Vomiting 04/02/2011   Penicillins Hives, Rash, and Other (See Comments) 04/02/2011       Review of Systems   General: Negative for anorexia, weight loss, fever, chills, fatigue, weakness. ENT: Negative for hoarseness, difficulty swallowing , nasal congestion. CV: Negative for chest pain, angina, palpitations, dyspnea on exertion, peripheral edema.  Respiratory: Negative for dyspnea at rest, dyspnea on exertion, cough, sputum, wheezing.  GI: See history of present illness. GU:  Negative for dysuria, hematuria, urinary incontinence, urinary frequency, nocturnal urination.  Endo: Negative for unusual weight change.     Physical Exam   BP 116/77 (BP Location: Right Arm, Patient Position: Sitting, Cuff Size: Large)   Pulse 86   Temp 98.1 F (36.7 C) (Oral)   Ht '5\' 4"'$  (1.626 m)   Wt 158 lb 12.8 oz (72 kg)   SpO2 94%   BMI 27.26 kg/m    General: Well-nourished, well-developed in no acute distress.  Eyes: No icterus. Mouth: Oropharyngeal mucosa moist and pink   Abdomen: Bowel sounds are normal, nontender, nondistended, no hepatosplenomegaly or masses,  no abdominal bruits or hernia , no rebound or guarding.  Rectal: not performed Extremities: No lower extremity edema. No clubbing or deformities. Neuro: Alert and oriented x 4   Skin: Warm and dry, no jaundice.   Psych: Alert and cooperative, normal mood and affect.  Labs   Lab Results  Component Value Date   CREATININE 0.74 05/13/2022   BUN 18 05/13/2022   NA 136 05/13/2022   K 4.2 05/13/2022   CL 104 05/13/2022   CO2 26 05/13/2022   Lab Results  Component Value Date   ALT 11 05/13/2022   AST 21 05/13/2022   ALKPHOS 35 (L) 05/13/2022   BILITOT 0.9 05/13/2022   Lab Results  Component Value Date   WBC 6.1 05/13/2022   HGB 14.0 05/13/2022   HCT 41.7 05/13/2022   MCV  90.5 05/13/2022   PLT 139 (L) 05/13/2022      Imaging Studies   No results found.  Assessment   GERD: doing well on PPI  Cirrhosis: remains well compensated. Due for labs. Once received we will move towards hepatoma screen. Will offer her EGD after next visit as she has several appointments and procedures pending.      PLAN   Labs for MELD and hepatoma screening. She has been having labs drawn when she goes for Dr. Tomie China labs every six months. We will try to keep order for our labs available for her every six months.  Imaging for hepatoma screening pending labs.  EGD  later this year.  Ov in six months.     Laureen Ochs. Bobby Rumpf, [place] [facility], [facility]

## 2022-06-08 NOTE — Patient Instructions (Signed)
Please compete my labs on 06/17/22. We will be in touch with results.  I will let Dr. Tomie China office know you are coming for my labs and to make sure all that needs to be done will be done at same time.  In the next month or two, would recommend abdominal ultrasound to look at your liver. We will reach out to schedule after your labs return.  Return office visit in six months.

## 2022-06-09 ENCOUNTER — Other Ambulatory Visit: Payer: Self-pay | Admitting: *Deleted

## 2022-06-09 DIAGNOSIS — Z170 Estrogen receptor positive status [ER+]: Secondary | ICD-10-CM

## 2022-06-09 DIAGNOSIS — D696 Thrombocytopenia, unspecified: Secondary | ICD-10-CM

## 2022-06-17 ENCOUNTER — Inpatient Hospital Stay (HOSPITAL_BASED_OUTPATIENT_CLINIC_OR_DEPARTMENT_OTHER): Payer: 59 | Admitting: Hematology

## 2022-06-17 ENCOUNTER — Inpatient Hospital Stay: Payer: 59

## 2022-06-17 ENCOUNTER — Inpatient Hospital Stay: Payer: 59 | Attending: Hematology

## 2022-06-17 ENCOUNTER — Other Ambulatory Visit (HOSPITAL_COMMUNITY)
Admission: RE | Admit: 2022-06-17 | Discharge: 2022-06-17 | Disposition: A | Discharge status: Home / Self Care | Payer: 59 | Source: Ambulatory Visit | Attending: Gastroenterology | Admitting: Gastroenterology

## 2022-06-17 VITALS — HR 78 | Temp 98.4°F | Resp 18 | Ht 64.0 in | Wt 155.5 lb

## 2022-06-17 DIAGNOSIS — Z170 Estrogen receptor positive status [ER+]: Secondary | ICD-10-CM

## 2022-06-17 DIAGNOSIS — D696 Thrombocytopenia, unspecified: Secondary | ICD-10-CM | POA: Diagnosis not present

## 2022-06-17 DIAGNOSIS — C50212 Malignant neoplasm of upper-inner quadrant of left female breast: Secondary | ICD-10-CM | POA: Diagnosis present

## 2022-06-17 DIAGNOSIS — Z79811 Long term (current) use of aromatase inhibitors: Secondary | ICD-10-CM | POA: Diagnosis not present

## 2022-06-17 DIAGNOSIS — K7460 Unspecified cirrhosis of liver: Secondary | ICD-10-CM | POA: Diagnosis not present

## 2022-06-17 DIAGNOSIS — M8580 Other specified disorders of bone density and structure, unspecified site: Secondary | ICD-10-CM | POA: Insufficient documentation

## 2022-06-17 DIAGNOSIS — K219 Gastro-esophageal reflux disease without esophagitis: Secondary | ICD-10-CM | POA: Diagnosis present

## 2022-06-17 DIAGNOSIS — E559 Vitamin D deficiency, unspecified: Secondary | ICD-10-CM | POA: Insufficient documentation

## 2022-06-17 LAB — COMPREHENSIVE METABOLIC PANEL
ALT: 6 U/L (ref 0–44)
AST: 21 U/L (ref 15–41)
Albumin: 4.2 g/dL (ref 3.5–5.0)
Alkaline Phosphatase: 43 U/L (ref 38–126)
Anion gap: 9 (ref 5–15)
BUN: 19 mg/dL (ref 8–23)
CO2: 26 mmol/L (ref 22–32)
Calcium: 9.3 mg/dL (ref 8.9–10.3)
Chloride: 103 mmol/L (ref 98–111)
Creatinine, Ser: 0.78 mg/dL (ref 0.44–1.00)
GFR, Estimated: >60 mL/min (ref >60)
Glucose, Bld: 104 mg/dL — ABNORMAL HIGH (ref 70–99)
Potassium: 4.6 mmol/L (ref 3.5–5.1)
Sodium: 138 mmol/L (ref 135–145)
Total Bilirubin: 0.9 mg/dL (ref 0.3–1.2)
Total Protein: 6.7 g/dL (ref 6.5–8.1)

## 2022-06-17 LAB — CBC WITH DIFFERENTIAL/PLATELET
Abs Immature Granulocytes: 0.02 10*3/uL (ref 0.00–0.07)
Basophils Absolute: 0.1 10*3/uL (ref 0.0–0.1)
Basophils Relative: 1 %
Eosinophils Absolute: 0.1 10*3/uL (ref 0.0–0.5)
Eosinophils Relative: 1 %
HCT: 42.8 % (ref 36.0–46.0)
Hemoglobin: 14.1 g/dL (ref 12.0–15.0)
Immature Granulocytes: 0 %
Lymphocytes Relative: 46 %
Lymphs Abs: 3.1 10*3/uL (ref 0.7–4.0)
MCH: 30.7 pg (ref 26.0–34.0)
MCHC: 32.9 g/dL (ref 30.0–36.0)
MCV: 93.2 fL (ref 80.0–100.0)
Monocytes Absolute: 0.7 10*3/uL (ref 0.1–1.0)
Monocytes Relative: 10 %
Neutro Abs: 2.9 10*3/uL (ref 1.7–7.7)
Neutrophils Relative %: 42 %
Platelets: 145 10*3/uL — ABNORMAL LOW (ref 150–400)
RBC: 4.59 MIL/uL (ref 3.87–5.11)
RDW: 12.2 % (ref 11.5–15.5)
WBC: 6.8 10*3/uL (ref 4.0–10.5)
nRBC: 0 % (ref 0.0–0.2)

## 2022-06-17 LAB — PROTIME-INR
INR: 1.2 (ref 0.8–1.2)
Prothrombin Time: 14.6 seconds (ref 11.4–15.2)

## 2022-06-17 MED ORDER — DENOSUMAB 60 MG/ML ~~LOC~~ SOSY
60.0000 mg | PREFILLED_SYRINGE | Freq: Once | SUBCUTANEOUS | Status: AC
  Administered 2022-06-17: 60 mg via SUBCUTANEOUS
  Filled 2022-06-17: qty 1

## 2022-06-17 NOTE — Progress Notes (Signed)
[facility] [address], [place] [address]    Clinic Day:  06/17/2022  Referring physician: Derek Jack, MD  Patient Care Team: Derek Jack, MD as PCP - General (Hematology) Danie Binder, MD (Inactive) as Consulting Physician (Gastroenterology)   ASSESSMENT & PLAN:   Assessment: 1.  Stage Ia (T1BN0) left breast IDC, ER/PR positive, HER-2 negative: -Left modified radical mastectomy and SLNB on 01/23/2019, 0.8 cm IDC, margins negative, grade 2. -Anastrozole started on 03/03/2019.   2.  Thrombocytopenia: -She has nodular liver consistent with mild cirrhosis from April 2019.  Spleen was normal. -She has thrombocytopenia ranging between 78-2 72 since 2019.  Ultrasound of the abdomen on 12/29/2018 showed normal-sized spleen. -Differential diagnosis includes thrombocytopenia from liver disease versus ITP.  Latest platelet count is 110.  No bleeding issues.   3.  Osteopenia: -DEXA scan from 03/23/2019 shows T score of -2.4. -Prolia was started on 04/04/2019.   4.  Tobacco abuse: -She smokes half pack of cigarettes per day for the past 45 years.  Unfortunately she does not qualify for low-dose CT.  Plan: 1.  Stage Ia (T1BN0) left breast IDC, ER/PR positive, HER-2 negative: - She is tolerating anastrozole very well. - Physical exam of the left mastectomy site is within normal limits.  No palpable adenopathy. - Reviewed labs today which showed normal LFTs and CBC. - Will schedule right breast mammogram after 11/01/2022.  RTC 6 months for follow-up.     2.  Vitamin D deficiency: - Last vitamin D level is normal.  Continue vitamin D supplements.     3.  Thrombocytopenia: - Mild thrombocytopenia with platelet count 145 stable.  No bleeding issues.    Orders Placed This Encounter  Procedures   MM 3D SCREEN BREAST UNI RIGHT    NAS No implants [facility]    Standing Status:   Future    Standing Expiration Date:   06/17/2023    Order  Specific Question:   Reason for Exam (SYMPTOM  OR DIAGNOSIS REQUIRED)    Answer:   breast cancer screening    Order Specific Question:   Preferred imaging location?    Answer:   [place] as a scribe for Derek Jack, MD.,have documented all relevant documentation on the behalf of Derek Jack, MD,as directed by  Derek Jack, MD while in the presence of Derek Jack, MD.   I, Derek Jack MD, have reviewed the above documentation for accuracy and completeness, and I agree with the above.   Doyce Loose   2/14/202412:59 PM  CHIEF COMPLAINT:   Diagnosis: left breast cancer     Cancer Staging  Breast cancer [facility]) Staging form: Breast, AJCC 8th Edition - Clinical stage from 02/23/2019: Stage IA (cT1b, cN0(sn), cM0, G2, ER+, PR+, HER2-) - Signed by Derek Jack, MD on 02/23/2019    Prior Therapy: Left modified radical mastectomy and SLNB on 01/23/2019  Current Therapy: Anastrozole started on 03/03/2019   HISTORY OF PRESENT ILLNESS:   Oncology History  Breast cancer ([place])  11/28/2018 Initial Diagnosis   Breast cancer ([place])   02/23/2019 Cancer Staging   Staging form: Breast, AJCC 8th Edition - Clinical stage from 02/23/2019: Stage IA (cT1b, cN0(sn), cM0, G2, ER+, PR+, HER2-) - Signed by Derek Jack, MD on 02/23/2019      INTERVAL HISTORY:   Laurie Moreno is a 72 y.o. female presenting to clinic today for follow up of left breast cancer. She  was last seen by me on 11/10/2021.  Today, she states that she is doing well overall. Her appetite level is at 100%. She also denies abdominal pain. She drink x1 ensure a day. Her energy level is at 80%. She has 3/20 right shoulder pain. She denies any surgery for future treatment. She is doing well with her cancer pills. She needs surgery for skin around her eye.   PAST MEDICAL HISTORY:   Past Medical History: Past Medical History:  Diagnosis  Date   Acute pyelonephritis    Anxiety    Bipolar disorder, unspecified ([place])    From [place] form   CHF (congestive heart failure) ([place])    see LOV 06-25-16 epic care everywhere ; "sister reports that patient has a 'weak heart'  but they did testing and she hasnt had to go back since"   Chronic respiratory failure with hypoxia ([place])    Complication of anesthesia    reports with last surgery on 09-07-17 it took 3 hours for her to wake up and she was on the ventilator post operatively for an additional  8 hours    COPD (chronic obstructive pulmonary disease) ([place])    Dermatitis    Dermatitis    Dysrhythmia    Elevated temperature 10/26/2017   temp of 99.9 , denies cold/flu sx; reports she gets frwquent UTIs, patient sister accompanying today  reports she is going to Dr Gloriann Loan (surgeon) office today  for pre-op appt  at 2pm   Essential tremor    Family history of adverse reaction to anesthesia    sister gets nausea and vomiting    Fever    History of ESBL E. coli infection    10-26-17 this RN did not enter this ESBL inf hx; patient and sister deny hx of ESBL infection and there is no supporting lab work for ESBL infection in epic    Hyperlipidemia    Hypothyroidism    Thrombocytopenia ([place])    Unspecified hydronephrosis    Urinary incontinence     Surgical History: Past Surgical History:  Procedure Laterality Date   BIOPSY  10/12/2019   Procedure: BIOPSY;  Surgeon: Daneil Dolin, MD;  [facility];  Service: Endoscopy;;   CARDIAC CATHETERIZATION  07/15/2016   at [facility]   COLONOSCOPY  12/2013   Dr. Aaron Edelman C. [facility] at [facility], long tortuous colon, nonbleeding external hemorrhoids, questionable small subcentimeter benign/hyperplastic polyp in the ascending colon, not removed.  Advised to have repeat colonoscopy in 4 years.   COLONOSCOPY WITH PROPOFOL N/A 10/12/2019   [place]: 1.5 cm verrucous, raised nodule upper end of her gluteal folds.   Scattered diverticula.  3 sessile polyps removed, tubular adenomas.  Next colonoscopy in 5 years.   CYSTOSCOPY W/ URETERAL STENT PLACEMENT Left 09/07/2017   Procedure: CYSTOSCOPY WITH RETROGRADE PYELOGRAM/URETERAL STENT PLACEMENT;  Surgeon: Lucas Mallow, MD;  Location: [facility];  Service: Urology;  Laterality: Left;   ESOPHAGOGASTRODUODENOSCOPY (EGD) WITH PROPOFOL N/A 10/12/2019   [place]: Portal hypertensive gastropathy.  Linear gastric erosions.  No varices.  Biopsy showed reactive gastropathy.  No H. pylori.  Repeat EGD in 2 years to screen for varices.   EYE SURGERY  5 years ago    bilateral cataract extraction    eyelid lift Bilateral    LAPAROSCOPIC NEPHRECTOMY, HAND ASSISTED Left 11/01/2017   Procedure: LEFT HAND ASSISTED LAPAROSCOPIC NEPHRECTOMY;  Surgeon: Lucas Mallow, MD;  Location: [facility];  Service: Urology;  Laterality: Left;   MASTECTOMY MODIFIED RADICAL Left 01/23/2019   Procedure: LEFT MODIFIED RADICAL MASTECTOMY;  Surgeon: Aviva Signs, MD;  Location: [facility];  Service: General;  Laterality: Left;   ORIF ANKLE FRACTURE Right 05/10/2021   Procedure: OPEN REDUCTION INTERNAL FIXATION (ORIF) ANKLE FRACTURE;  Surgeon: Willaim Sheng, MD;  Location: [facility];  Service: Orthopedics;  Laterality: Right;   POLYPECTOMY  10/12/2019   Procedure: POLYPECTOMY;  Surgeon: Daneil Dolin, MD;  [facility];  Service: Endoscopy;;    Social History: Social History   Socioeconomic History   Marital status: Single    Spouse name: Not on file   Number of children: 1   Years of education: Not on file   Highest education level: Not on file  Occupational History   Occupation: disability  Tobacco Use   Smoking status: Every Day    Packs/day: 0.50    Years: 45.00    Total pack years: 22.50    Types: Cigarettes   Smokeless tobacco: Never   Tobacco comments:    smokes 8 cigarettes per day 02/01/2020  Vaping Use   Vaping Use: Never used  Substance and Sexual Activity   Alcohol  use: Not Currently    Comment: no prior history heavy etoh use   Drug use: Not Currently   Sexual activity: Not Currently  Other Topics Concern   Not on file  Social History Narrative   Not on file   Social Determinants of Health   Financial Resource Strain: Low Risk  (11/28/2018)   Overall Financial Resource Strain (CARDIA)    Difficulty of Paying Living Expenses: Not hard at all  Food Insecurity: No Food Insecurity (11/28/2018)   Hunger Vital Sign    Worried About Running Out of Food in the Last Year: Never true    Ran Out of Food in the Last Year: Never true  Transportation Needs: No Transportation Needs (11/28/2018)   PRAPARE - Hydrologist (Medical): No    Lack of Transportation (Non-Medical): No  Physical Activity: Sufficiently Active (11/28/2018)   Exercise Vital Sign    Days of Exercise per Week: 5 days    Minutes of Exercise per Session: 30 min  Stress: No Stress Concern Present (11/28/2018)   [place]    Feeling of Stress : Not at all  Social Connections: Moderately Isolated (11/28/2018)   Social Connection and Isolation Panel [NHANES]    Frequency of Communication with Friends and Family: More than three times a week    Frequency of Social Gatherings with Friends and Family: More than three times a week    Attends Religious Services: 1 to 4 times per year    Active Member of [employer] or Organizations: No    Attends Archivist Meetings: Never    Marital Status: Never married  Intimate Partner Violence: Not At Risk (11/28/2018)   Humiliation, Afraid, Rape, and Kick questionnaire    Fear of Current or Ex-Partner: No    Emotionally Abused: No    Physically Abused: No    Sexually Abused: No    Family History: Family History  Problem Relation Age of Onset   Colon cancer Mother        died at age 4   Colon cancer Father        died in his 77s   Tremor Father     Melanoma Brother    Colon cancer Paternal  Grandmother        age 61   Colon cancer Paternal Grandfather        in his 20s   Liver cancer Maternal Grandfather        died age 40    Current Medications:  Current Outpatient Medications:    acetaminophen (TYLENOL) 500 MG tablet, Take by mouth., Disp: , Rfl:    albuterol (VENTOLIN HFA) 108 (90 Base) MCG/ACT inhaler, Inhale 2 puffs into the lungs as needed for wheezing or shortness of breath., Disp: , Rfl:    anastrozole (ARIMIDEX) 1 MG tablet, TAKE 1 TABLET BY MOUTH EVERY DAY, Disp: 90 tablet, Rfl: 3   atorvastatin (LIPITOR) 40 MG tablet, Take 40 mg by mouth daily. , Disp: , Rfl:    Calcium Carbonate-Vit D-Min (CALCIUM 600+D3 PLUS MINERALS) 600-800 MG-UNIT CHEW, Chew 1 tablet by mouth 2 (two) times daily., Disp: , Rfl:    Cholecalciferol (VITAMIN D) 50 MCG (2000 UT) tablet, Take 2,000 Units by mouth daily., Disp: , Rfl:    clonazePAM (KLONOPIN) 0.5 MG tablet, Take 1 tablet (0.5 mg total) by mouth 2 (two) times daily. (Patient taking differently: Take 0.5 mg by mouth 2 (two) times daily as needed.), Disp: 60 tablet, Rfl: 0   Denosumab (PROLIA [place]), Inject into the skin every 6 (six) months., Disp: , Rfl:    divalproex (DEPAKOTE) 250 MG DR tablet, Take 250 mg by mouth 3 (three) times daily. Do not crush, Disp: , Rfl:    escitalopram (LEXAPRO) 20 MG tablet, Take 20 mg by mouth daily., Disp: , Rfl:    fluticasone (FLONASE) 50 MCG/ACT nasal spray, Place 2 sprays into both nostrils daily., Disp: , Rfl:    gabapentin (NEURONTIN) 300 MG capsule, Take 300 mg by mouth at bedtime., Disp: , Rfl:    hydrOXYzine (ATARAX/VISTARIL) 25 MG tablet, Take 25 mg by mouth every 8 (eight) hours as needed for itching. , Disp: , Rfl:    levothyroxine (SYNTHROID, LEVOTHROID) 125 MCG tablet, Take 125 mcg by mouth daily before breakfast., Disp: , Rfl:    lisinopril (ZESTRIL) 2.5 MG tablet, Take 2.5 mg by mouth daily., Disp: , Rfl:    metoprolol tartrate (LOPRESSOR) 25 MG  tablet, 1/2 tablet with food Orally Twice a day for 90 days, Disp: , Rfl:    Multiple Vitamin (MULTIVITAMIN) tablet, Take 1 tablet by mouth every morning. , Disp: , Rfl:    oxybutynin (DITROPAN-XL) 5 MG 24 hr tablet, Take 5 mg by mouth daily., Disp: , Rfl:    pantoprazole (PROTONIX) 40 MG tablet, TAKE 1 TABLET BY MOUTH EVERY DAY BEFORE BREAKFAST (Patient taking differently: Take 40 mg by mouth daily.), Disp: 90 tablet, Rfl: 3   primidone (MYSOLINE) 50 MG tablet, Take 100 mg by mouth 2 (two) times daily. , Disp: , Rfl:    SPIRIVA HANDIHALER 18 MCG inhalation capsule, 1 capsule daily., Disp: , Rfl:    SYMBICORT 160-4.5 MCG/ACT inhaler, SMARTSIG:2 Puff(s) By Mouth Daily PRN, Disp: , Rfl:    carbidopa-levodopa (SINEMET IR) 25-100 MG tablet, Take 1.5 tablets by mouth 3 (three) times daily., Disp: , Rfl:    Allergies: Allergies  Allergen Reactions   Codeine Nausea And Vomiting    In cough syrup cause nausea and vomiting   Penicillins Hives, Rash and Other (See Comments)    Did it involve swelling of the face/tongue/throat, SOB, or low BP? Unknown Did it involve sudden or severe rash/hives, skin peeling, or any reaction on the inside of your mouth  or nose? Unknown Did you need to seek medical attention at a hospital or doctor's office? Unknown When did it last happen?    as a child If all above answers are "NO", may proceed with cephalosporin use.     REVIEW OF SYSTEMS:   Review of Systems  Constitutional:  Negative for chills, fatigue and fever.  HENT:   Negative for lump/mass, mouth sores, nosebleeds, sore throat and trouble swallowing.   Eyes:  Negative for eye problems.  Respiratory:  Negative for cough and shortness of breath.   Cardiovascular:  Negative for chest pain, leg swelling and palpitations.  Gastrointestinal:  Negative for abdominal pain, constipation, diarrhea, nausea and vomiting.  Genitourinary:  Negative for bladder incontinence, difficulty urinating, dysuria, frequency,  hematuria and nocturia.   Musculoskeletal:  Negative for arthralgias, back pain, flank pain, myalgias and neck pain.  Skin:  Negative for itching and rash.  Neurological:  Negative for dizziness, headaches and numbness.  Hematological:  Does not bruise/bleed easily.  Psychiatric/Behavioral:  Positive for depression. Negative for sleep disturbance and suicidal ideas. The patient is nervous/anxious.   All other systems reviewed and are negative.    VITALS:   Pulse 78, temperature 98.4 F (36.9 C), temperature source Oral, resp. rate 18, height 5' 4"$  (1.626 m), weight 155 lb 8 oz (70.5 kg), SpO2 95 %.  Wt Readings from Last 3 Encounters:  06/17/22 155 lb 8 oz (70.5 kg)  06/08/22 158 lb 12.8 oz (72 kg)  11/10/21 159 lb (72.1 kg)    Body mass index is 26.69 kg/m.  Performance status (ECOG): 1 - Symptomatic but completely ambulatory  PHYSICAL EXAM:   Physical Exam Vitals and nursing note reviewed. Exam conducted with a chaperone present.  Constitutional:      Appearance: Normal appearance.  Cardiovascular:     Rate and Rhythm: Normal rate and regular rhythm.     Pulses: Normal pulses.     Heart sounds: Normal heart sounds.  Pulmonary:     Effort: Pulmonary effort is normal.     Breath sounds: Normal breath sounds.  Abdominal:     Palpations: Abdomen is soft. There is no hepatomegaly, splenomegaly or mass.     Tenderness: There is no abdominal tenderness.  Musculoskeletal:     Right lower leg: No edema.     Left lower leg: No edema.  Lymphadenopathy:     Cervical: No cervical adenopathy.     Right cervical: No superficial, deep or posterior cervical adenopathy.    Left cervical: No superficial, deep or posterior cervical adenopathy.     Upper Body:     Right upper body: No supraclavicular or axillary adenopathy.     Left upper body: No supraclavicular or axillary adenopathy.  Neurological:     General: No focal deficit present.     Mental Status: She is alert and oriented  to person, place, and time.  Psychiatric:        Mood and Affect: Mood normal.        Behavior: Behavior normal.     LABS:      Latest Ref Rng & Units 06/17/2022   10:03 AM 05/13/2022    1:37 PM 10/31/2021    9:07 AM  CBC  WBC 4.0 - 10.5 K/uL 6.8  6.1  5.5   Hemoglobin 12.0 - 15.0 g/dL 14.1  14.0  13.5   Hematocrit 36.0 - 46.0 % 42.8  41.7  39.4   Platelets 150 - 400 K/uL 145  139  134       Latest Ref Rng & Units 06/17/2022   10:03 AM 05/13/2022    1:37 PM 10/31/2021    9:07 AM  CMP  Glucose 70 - 99 mg/dL 104  104  108   BUN 8 - 23 mg/dL 19  18  24   $ Creatinine 0.44 - 1.00 mg/dL 0.78  0.74  0.70   Sodium 135 - 145 mmol/L 138  136  137   Potassium 3.5 - 5.1 mmol/L 4.6  4.2  4.5   Chloride 98 - 111 mmol/L 103  104  104   CO2 22 - 32 mmol/L 26  26  25   $ Calcium 8.9 - 10.3 mg/dL 9.3  9.1  9.1   Total Protein 6.5 - 8.1 g/dL 6.7  6.6  6.8   Total Bilirubin 0.3 - 1.2 mg/dL 0.9  0.9  0.9   Alkaline Phos 38 - 126 U/L 43  35  38   AST 15 - 41 U/L 21  21  22   $ ALT 0 - 44 U/L 6  11  6      $ No results found for: "CEA1", "CEA" / No results found for: "CEA1", "CEA" No results found for: "PSA1" No results found for: "[id number]" No results found for: "CAN125"  No results found for: "TOTALPROTELP", "ALBUMINELP", "A1GS", "A2GS", "BETS", "BETA2SER", "GAMS", "MSPIKE", "SPEI" Lab Results  Component Value Date   TIBC 340 12/23/2018   FERRITIN 107 12/23/2018   IRONPCTSAT 26 12/23/2018   No results found for: "LDH"   STUDIES:   No results found.

## 2022-06-17 NOTE — Progress Notes (Signed)
Patient presents today for f/u visit with Dr. Delton Coombes and Prolia injection. Creatinine is 0.78 and calcium 9.3 today. Patient takes Calcium Carbonate - Vitamin D (Calcium 600+D3 plus minerals) 600-800 MG-unit . Patient denies jaw pain or major upcoming dental work.   Laurie Moreno presents today for injection per MD orders. Prolia administered SQ in right Abdomen. Administration without incident. Patient tolerated well.  No complaints at this time. Discharged from clinic ambulatory in stable condition. Alert and oriented x 3. F/U with [facility] as scheduled.

## 2022-06-17 NOTE — Patient Instructions (Addendum)
[place] at [facility] Discharge Instructions   You were seen and examined today by Dr. Delton Coombes.  He reviewed the results of your lab work which are normal/stable.   Continue anastrozole as prescribed.   We will repeat a mammogram in June.   Return as scheduled in 6 months.    Thank you for choosing [place] at [facility] to provide your oncology and hematology care.  To afford each patient quality time with our provider, please arrive at least 15 minutes before your scheduled appointment time.   If you have a lab appointment with the [facility] please come in thru the Main Entrance and check in at the main information desk.  You need to re-schedule your appointment should you arrive 10 or more minutes late.  We strive to give you quality time with our providers, and arriving late affects you and other patients whose appointments are after yours.  Also, if you no show three or more times for appointments you may be dismissed from the clinic at the providers discretion.     Again, thank you for choosing [facility].  Our hope is that these requests will decrease the amount of time that you wait before being seen by our physicians.       _____________________________________________________________  Should you have questions after your visit to [facility], please contact our office at [phone] and follow the prompts.  Our office hours are 8:00 a.m. and 4:30 p.m. Monday - Friday.  Please note that voicemails left after 4:00 p.m. may not be returned until the following business day.  We are closed weekends and major holidays.  You do have access to a nurse 24-7, just call the main number to the clinic [phone] and do not press any options, hold on the line and a nurse will answer the phone.    For prescription refill requests, have your pharmacy contact our office and allow 72 hours.    Due to Covid,  you will need to wear a mask upon entering the hospital. If you do not have a mask, a mask will be given to you at the Main Entrance upon arrival. For doctor visits, patients may have 1 support person age 72 or older with them. For treatment visits, patients can not have anyone with them due to social distancing guidelines and our immunocompromised population.

## 2022-06-17 NOTE — Patient Instructions (Signed)
[facility]  Discharge Instructions: Thank you for choosing [facility] to provide your oncology and hematology care.  If you have a lab appointment with the [facility], please come in thru the Main Entrance and check in at the main information desk.  Wear comfortable clothing and clothing appropriate for easy access to any Portacath or PICC line.   We strive to give you quality time with your provider. You may need to reschedule your appointment if you arrive late (15 or more minutes).  Arriving late affects you and other patients whose appointments are after yours.  Also, if you miss three or more appointments without notifying the office, you may be dismissed from the clinic at the provider's discretion.      For prescription refill requests, have your pharmacy contact our office and allow 72 hours for refills to be completed.    Today you received the following chemotherapy and/or immunotherapy agents Prolia injection.  Denosumab Injection (Osteoporosis) What is this medication? DENOSUMAB (den oh SUE mab) prevents and treats osteoporosis. It works by [employer] stronger and less likely to break (fracture). It is a monoclonal antibody. This medicine may be used for other purposes; ask your health care provider or pharmacist if you have questions. COMMON BRAND NAME(S): Prolia What should I tell my care team before I take this medication? They need to know if you have any of these conditions: Dental or gum disease, or plan to have dental surgery or a tooth pulled Infection Kidney disease Low levels of calcium or vitamin D in your blood On dialysis Poor nutrition Skin conditions Thyroid disease, or have had thyroid or parathyroid surgery Trouble absorbing minerals in your stomach or intestine An unusual or allergic reaction to denosumab, other medications, foods, dyes, or preservatives Pregnant or trying to get pregnant Breastfeeding How  should I use this medication? This medication is injected under the skin. It is given by your care team in a hospital or clinic setting. A special MedGuide will be given to you before each treatment. Be sure to read this information carefully each time. Talk to your care team about the use of this medication in children. Special care may be needed. Overdosage: If you think you have taken too much of this medicine contact a poison control center or emergency room at once. NOTE: This medicine is only for you. Do not share this medicine with others. What if I miss a dose? Keep appointments for follow-up doses. It is important not to miss your dose. Call your care team if you are unable to keep an appointment. What may interact with this medication? Do not take this medication with any of the following: Other medications that contain denosumab This medication may also interact with the following: Medications that lower your chance of fighting infection Steroid medications, such as prednisone or cortisone This list may not describe all possible interactions. Give your health care provider a list of all the medicines, herbs, non-prescription drugs, or dietary supplements you use. Also tell them if you smoke, drink alcohol, or use illegal drugs. Some items may interact with your medicine. What should I watch for while using this medication? Your condition will be monitored carefully while you are receiving this medication. You may need blood work while taking this medication. This medication may increase your risk of getting an infection. Call your care team for advice if you get a fever, chills, sore throat, or other symptoms of a cold  or flu. Do not treat yourself. Try to avoid being around people who are sick. Tell your dentist and dental surgeon that you are taking this medication. You should not have major dental surgery while on this medication. See your dentist to have a dental exam and fix any  dental problems before starting this medication. Take good care of your teeth while on this medication. Make sure you see your dentist for regular follow-up appointments. You should make sure you get enough calcium and vitamin D while you are taking this medication. Discuss the foods you eat and the vitamins you take with your care team. Talk to your care team if you are pregnant or think you might be pregnant. This medication can cause serious birth defects if taken during pregnancy and for 5 months after the last dose. You will need a negative pregnancy test before starting this medication. Contraception is recommended while taking this medication and for 5 months after the last dose. Your care team can help you find the option that works for you. Talk to your care team before breastfeeding. Changes to your treatment plan may be needed. What side effects may I notice from receiving this medication? Side effects that you should report to your care team as soon as possible: Allergic reactions--skin rash, itching, hives, swelling of the face, lips, tongue, or throat Infection--fever, chills, cough, sore throat, wounds that don't heal, pain or trouble when passing urine, general feeling of discomfort or being unwell Low calcium level--muscle pain or cramps, confusion, tingling, or numbness in the hands or feet Osteonecrosis of the jaw--pain, swelling, or redness in the mouth, numbness of the jaw, poor healing after dental work, unusual discharge from the mouth, visible bones in the mouth Severe bone, joint, or muscle pain Skin infection--skin redness, swelling, warmth, or pain Side effects that usually do not require medical attention (report these to your care team if they continue or are bothersome): Back pain Headache Joint pain Muscle pain Pain in the hands, arms, legs, or feet Runny or stuffy nose Sore throat This list may not describe all possible side effects. Call your doctor for medical  advice about side effects. You may report side effects to FDA at 1-800-FDA-1088. Where should I keep my medication? This medication is given in a hospital or clinic. It will not be stored at home. NOTE: This sheet is a summary. It may not cover all possible information. If you have questions about this medicine, talk to your doctor, pharmacist, or health care provider.  2023 Elsevier/Gold Standard (2021-09-01 00:00:00)       To help prevent nausea and vomiting after your treatment, we encourage you to take your nausea medication as directed.  BELOW ARE SYMPTOMS THAT SHOULD BE REPORTED IMMEDIATELY: *FEVER GREATER THAN 100.4 F (38 C) OR HIGHER *CHILLS OR SWEATING *NAUSEA AND VOMITING THAT IS NOT CONTROLLED WITH YOUR NAUSEA MEDICATION *UNUSUAL SHORTNESS OF BREATH *UNUSUAL BRUISING OR BLEEDING *URINARY PROBLEMS (pain or burning when urinating, or frequent urination) *BOWEL PROBLEMS (unusual diarrhea, constipation, pain near the anus) TENDERNESS IN MOUTH AND THROAT WITH OR WITHOUT PRESENCE OF ULCERS (sore throat, sores in mouth, or a toothache) UNUSUAL RASH, SWELLING OR PAIN  UNUSUAL VAGINAL DISCHARGE OR ITCHING   Items with * indicate a potential emergency and should be followed up as soon as possible or go to the Emergency Department if any problems should occur.  Please show the CHEMOTHERAPY ALERT CARD or IMMUNOTHERAPY ALERT CARD at check-in to the Emergency Department and triage  nurse.  Should you have questions after your visit or need to cancel or reschedule your appointment, please contact [facility] [phone]  and follow the prompts.  Office hours are 8:00 a.m. to 4:30 p.m. Monday - Friday. Please note that voicemails left after 4:00 p.m. may not be returned until the following business day.  We are closed weekends and major holidays. You have access to a nurse at all times for urgent questions. Please call the main number to the clinic [phone] and follow  the prompts.  For any non-urgent questions, you may also contact your provider using MyChart. We now offer e-Visits for anyone 4 and older to request care online for non-urgent symptoms. For details visit [email or web address].   Also download the MyChart app! Go to the app store, search "MyChart", open the app, select [facility], and log in with your MyChart username and password.

## 2022-06-19 LAB — AFP TUMOR MARKER: AFP, Serum, Tumor Marker: <1.8 ng/mL (ref 0.0–9.2)

## 2022-06-22 ENCOUNTER — Encounter: Payer: Self-pay | Admitting: *Deleted

## 2022-06-22 ENCOUNTER — Other Ambulatory Visit: Payer: Self-pay | Admitting: *Deleted

## 2022-06-22 DIAGNOSIS — K824 Cholesterolosis of gallbladder: Secondary | ICD-10-CM

## 2022-06-24 ENCOUNTER — Ambulatory Visit (HOSPITAL_COMMUNITY): Payer: 59

## 2022-07-06 ENCOUNTER — Ambulatory Visit (HOSPITAL_COMMUNITY)
Admission: RE | Admit: 2022-07-06 | Discharge: 2022-07-06 | Disposition: A | Discharge status: Home / Self Care | Payer: 59 | Source: Ambulatory Visit | Attending: Gastroenterology | Admitting: Gastroenterology

## 2022-07-06 DIAGNOSIS — K824 Cholesterolosis of gallbladder: Secondary | ICD-10-CM | POA: Diagnosis present

## 2022-08-25 ENCOUNTER — Telehealth: Payer: Self-pay

## 2022-08-25 NOTE — Telephone Encounter (Signed)
Pt is needing a letter for her insurance company stating why an u/s was needed on 07/06/2022. Pt states that the insurance company is not wanting to pay for it because it was a second one in a year.

## 2022-09-01 ENCOUNTER — Encounter: Payer: Self-pay | Admitting: Gastroenterology

## 2022-09-01 NOTE — Telephone Encounter (Signed)
I routed letter to you today.

## 2022-09-01 NOTE — Telephone Encounter (Signed)
Lmom for pt to return my call. Letter is up front for pick up.

## 2022-09-02 NOTE — Telephone Encounter (Signed)
Lmom for pt to return my call.  

## 2022-09-04 NOTE — Telephone Encounter (Signed)
Unable to reach pt by phone, mailed letter to patient.

## 2022-10-26 ENCOUNTER — Ambulatory Visit (HOSPITAL_COMMUNITY): Payer: 59

## 2022-11-02 ENCOUNTER — Ambulatory Visit: Payer: 59 | Admitting: Internal Medicine

## 2022-11-09 ENCOUNTER — Ambulatory Visit (HOSPITAL_COMMUNITY): Payer: 59

## 2022-11-12 ENCOUNTER — Other Ambulatory Visit (HOSPITAL_COMMUNITY): Payer: Self-pay | Admitting: Family Medicine

## 2022-11-12 DIAGNOSIS — Z720 Tobacco use: Secondary | ICD-10-CM

## 2022-11-13 ENCOUNTER — Encounter: Payer: Self-pay | Admitting: Gastroenterology

## 2022-12-10 ENCOUNTER — Ambulatory Visit (HOSPITAL_COMMUNITY): Payer: 59

## 2022-12-16 ENCOUNTER — Inpatient Hospital Stay: Payer: 59 | Attending: Hematology

## 2022-12-16 DIAGNOSIS — Z79811 Long term (current) use of aromatase inhibitors: Secondary | ICD-10-CM | POA: Diagnosis not present

## 2022-12-16 DIAGNOSIS — D696 Thrombocytopenia, unspecified: Secondary | ICD-10-CM | POA: Insufficient documentation

## 2022-12-16 DIAGNOSIS — M8580 Other specified disorders of bone density and structure, unspecified site: Secondary | ICD-10-CM | POA: Diagnosis not present

## 2022-12-16 DIAGNOSIS — E559 Vitamin D deficiency, unspecified: Secondary | ICD-10-CM | POA: Diagnosis not present

## 2022-12-16 DIAGNOSIS — C50912 Malignant neoplasm of unspecified site of left female breast: Secondary | ICD-10-CM | POA: Insufficient documentation

## 2022-12-16 DIAGNOSIS — Z170 Estrogen receptor positive status [ER+]: Secondary | ICD-10-CM | POA: Diagnosis not present

## 2022-12-16 DIAGNOSIS — F17210 Nicotine dependence, cigarettes, uncomplicated: Secondary | ICD-10-CM | POA: Insufficient documentation

## 2022-12-16 DIAGNOSIS — C50212 Malignant neoplasm of upper-inner quadrant of left female breast: Secondary | ICD-10-CM

## 2022-12-16 LAB — CBC WITH DIFFERENTIAL/PLATELET
Abs Immature Granulocytes: 0.01 10*3/uL (ref 0.00–0.07)
Basophils Absolute: 0.1 10*3/uL (ref 0.0–0.1)
Basophils Relative: 1 %
Eosinophils Absolute: 0.1 10*3/uL (ref 0.0–0.5)
Eosinophils Relative: 2 %
HCT: 40.1 % (ref 36.0–46.0)
Hemoglobin: 13.4 g/dL (ref 12.0–15.0)
Immature Granulocytes: 0 %
Lymphocytes Relative: 55 %
Lymphs Abs: 3.7 10*3/uL (ref 0.7–4.0)
MCH: 30.9 pg (ref 26.0–34.0)
MCHC: 33.4 g/dL (ref 30.0–36.0)
MCV: 92.4 fL (ref 80.0–100.0)
Monocytes Absolute: 0.5 10*3/uL (ref 0.1–1.0)
Monocytes Relative: 8 %
Neutro Abs: 2.3 10*3/uL (ref 1.7–7.7)
Neutrophils Relative %: 34 %
Platelets: 147 10*3/uL — ABNORMAL LOW (ref 150–400)
RBC: 4.34 MIL/uL (ref 3.87–5.11)
RDW: 12.7 % (ref 11.5–15.5)
WBC: 6.7 10*3/uL (ref 4.0–10.5)
nRBC: 0 % (ref 0.0–0.2)

## 2022-12-16 LAB — COMPREHENSIVE METABOLIC PANEL
ALT: 19 U/L (ref 0–44)
AST: 23 U/L (ref 15–41)
Albumin: 4 g/dL (ref 3.5–5.0)
Alkaline Phosphatase: 35 U/L — ABNORMAL LOW (ref 38–126)
Anion gap: 9 (ref 5–15)
BUN: 13 mg/dL (ref 8–23)
CO2: 27 mmol/L (ref 22–32)
Calcium: 8.9 mg/dL (ref 8.9–10.3)
Chloride: 101 mmol/L (ref 98–111)
Creatinine, Ser: 0.83 mg/dL (ref 0.44–1.00)
GFR, Estimated: >60 mL/min (ref >60)
Glucose, Bld: 134 mg/dL — ABNORMAL HIGH (ref 70–99)
Potassium: 4.4 mmol/L (ref 3.5–5.1)
Sodium: 137 mmol/L (ref 135–145)
Total Bilirubin: 0.5 mg/dL (ref 0.3–1.2)
Total Protein: 6.4 g/dL — ABNORMAL LOW (ref 6.5–8.1)

## 2022-12-17 ENCOUNTER — Encounter: Payer: Self-pay | Admitting: Internal Medicine

## 2022-12-21 ENCOUNTER — Encounter (HOSPITAL_COMMUNITY): Payer: Self-pay

## 2022-12-21 ENCOUNTER — Ambulatory Visit (HOSPITAL_COMMUNITY)
Admission: RE | Admit: 2022-12-21 | Discharge: 2022-12-21 | Disposition: A | Discharge status: Home / Self Care | Payer: 59 | Source: Ambulatory Visit | Attending: Hematology | Admitting: Hematology

## 2022-12-21 DIAGNOSIS — Z1231 Encounter for screening mammogram for malignant neoplasm of breast: Secondary | ICD-10-CM | POA: Diagnosis not present

## 2022-12-21 DIAGNOSIS — Z170 Estrogen receptor positive status [ER+]: Secondary | ICD-10-CM | POA: Diagnosis present

## 2022-12-21 DIAGNOSIS — C50212 Malignant neoplasm of upper-inner quadrant of left female breast: Secondary | ICD-10-CM | POA: Diagnosis present

## 2022-12-21 DIAGNOSIS — R92321 Mammographic fibroglandular density, right breast: Secondary | ICD-10-CM | POA: Diagnosis not present

## 2022-12-22 NOTE — Progress Notes (Signed)
[facility] [address] [place] [address]    Clinic Day:  12/23/2022  Referring physician: Doreatha Massed, MD  Patient Care Team: Doreatha Massed, MD as PCP - General (Hematology) [place] Bali, MD (Inactive) as Consulting Physician (Gastroenterology)   ASSESSMENT & PLAN:   Assessment: 1.  Stage Ia (T1BN0) left breast IDC, ER/PR positive, HER-2 negative: -Left modified radical mastectomy and SLNB on 01/23/2019, 0.8 cm IDC, margins negative, grade 2. -Anastrozole started on 03/03/2019.   2.  Thrombocytopenia: -She has nodular liver consistent with mild cirrhosis from April 2019.  Spleen was normal. -She has thrombocytopenia ranging between 78-2 72 since 2019.  Ultrasound of the abdomen on 12/29/2018 showed normal-sized spleen. -Differential diagnosis includes thrombocytopenia from liver disease versus ITP.  Latest platelet count is 110.  No bleeding issues.   3.  Osteopenia: -DEXA scan from 03/23/2019 shows T score of -2.4. -Prolia was started on 04/04/2019.   4.  Tobacco abuse: -She smokes half pack of cigarettes per day for the past 45 years.  Unfortunately she does not qualify for low-dose CT.    Plan: 1.  Stage Ia (T1BN0) left breast IDC, ER/PR positive, HER-2 negative: - She is tolerating anastrozole very well. - Physical exam: Left mastectomy site is within normal limits.  No palpable adenopathy.  No palpable right breast mass. - Labs from 12/16/2022: Normal LFTs.  CBC grossly normal.  Mild thrombocytopenia stable. -Right breast mammogram on 12/21/2022: BI-RADS Category 1. -We will schedule her for mammogram in August next year.  RTC 1 year for follow-up.   2.  Vitamin D deficiency: - Last vitamin D was 34.  Continue vitamin D supplements.   3.  Osteopenia (DEXA scan 04/22/2021 T-score -2.4): - Calcium is 8.9.  Continue Prolia every 6 months.  She reports high co-pay.  She will let us know if it continues to be a problem after today's  injection.  If it is, we will cancel Prolia and start her on oral bisphosphonates.      Orders Placed This Encounter  Procedures   MM 3D SCREENING MAMMOGRAM UNILATERAL RIGHT BREAST    Standing Status:   Future    Standing Expiration Date:   12/23/2023    Order Specific Question:   Reason for Exam (SYMPTOM  OR DIAGNOSIS REQUIRED)    Answer:   screening for breast cancer    Order Specific Question:   Preferred imaging location?    Answer:   [facility]    Order Specific Question:   Release to patient    Answer:   Immediate   CBC with Differential/Platelet    Standing Status:   Future    Standing Expiration Date:   12/23/2023    Order Specific Question:   Release to patient    Answer:   Immediate   Comprehensive metabolic panel    Standing Status:   Future    Standing Expiration Date:   12/23/2023    Order Specific Question:   Release to patient    Answer:   Immediate   VITAMIN D 25 Hydroxy (Vit-D Deficiency, Fractures)    Standing Status:   Future    Standing Expiration Date:   12/23/2023    Order Specific Question:   Release to patient    Answer:   Immediate      I,Katie Daubenspeck,acting as a scribe for Doreatha Massed, MD.,have documented all relevant documentation on the behalf of Doreatha Massed, MD,as directed by  Doreatha Massed,  MD while in the presence of Doreatha Massed, MD.   I, Doreatha Massed MD, have reviewed the above documentation for accuracy and completeness, and I agree with the above.   Doreatha Massed, MD   8/21/20245:32 PM  CHIEF COMPLAINT:   Diagnosis: left breast cancer    Cancer Staging  Breast cancer [facility]) Staging form: Breast, AJCC 8th Edition - Clinical stage from 02/23/2019: Stage IA (cT1b, cN0(sn), cM0, G2, ER+, PR+, HER2-) - Signed by Doreatha Massed, MD on 02/23/2019    Prior Therapy: Left modified radical mastectomy and SLNB on 01/23/2019   Current Therapy:  Anastrozole started on 03/03/2019     HISTORY OF PRESENT ILLNESS:   Oncology History  Breast cancer (HCC)  11/28/2018 Initial Diagnosis   Breast cancer (HCC)   02/23/2019 Cancer Staging   Staging form: Breast, AJCC 8th Edition - Clinical stage from 02/23/2019: Stage IA (cT1b, cN0(sn), cM0, G2, ER+, PR+, HER2-) - Signed by Doreatha Massed, MD on 02/23/2019      INTERVAL HISTORY:   Laurie Moreno is a 72 y.o. female presenting to clinic today for follow up of left breast cancer. She was last seen by me on 06/17/22.  Since her last visit, she underwent screening right mammogram on 12/21/22. Results were negative.  Today, she states that she is doing well overall. Her appetite level is at 100%. Her energy level is at 75%.  PAST MEDICAL HISTORY:   Past Medical History: Past Medical History:  Diagnosis Date   Acute pyelonephritis    Anxiety    Bipolar disorder, unspecified (HCC)    From [place] form   Breast cancer (HCC) 01/2019   INVASIVE CARCINOMA OF THE BREAST   CHF (congestive heart failure) (HCC)    see LOV 06-25-16 epic care everywhere ; "sister reports that patient has a 'weak heart'  but they did testing and she hasnt had to go back since"   Chronic respiratory failure with hypoxia (HCC)    Complication of anesthesia    reports with last surgery on 09-07-17 it took 3 hours for her to wake up and she was on the ventilator post operatively for an additional  8 hours    COPD (chronic obstructive pulmonary disease) (HCC)    Dermatitis    Dermatitis    Dysrhythmia    Elevated temperature 10/26/2017   temp of 99.9 , denies cold/flu sx; reports she gets frwquent UTIs, patient sister accompanying today  reports she is going to Dr Alvester Morin (surgeon) office today  for pre-op appt  at 2pm   Essential tremor    Family history of adverse reaction to anesthesia    sister gets nausea and vomiting    Fever    History of ESBL E. coli infection    10-26-17 this RN did not enter this ESBL inf hx; patient and sister deny hx of  ESBL infection and there is no supporting lab work for ESBL infection in epic    Hyperlipidemia    Hypothyroidism    Thrombocytopenia (HCC)    Unspecified hydronephrosis    Urinary incontinence     Surgical History: Past Surgical History:  Procedure Laterality Date   BIOPSY  10/12/2019   Procedure: BIOPSY;  Surgeon: Corbin Ade, MD;  Location: [facility];  Service: Endoscopy;;   CARDIAC CATHETERIZATION  07/15/2016   at [facility]   COLONOSCOPY  12/2013   Dr. Arlys [facility] C. Lewis at [facility], long tortuous colon, nonbleeding external hemorrhoids, questionable small subcentimeter benign/hyperplastic  polyp in the ascending colon, not removed.  Advised to have repeat colonoscopy in 4 years.   COLONOSCOPY WITH PROPOFOL N/A 10/12/2019   [place]: 1.5 cm verrucous, raised nodule upper end of her gluteal folds.  Scattered diverticula.  3 sessile polyps removed, tubular adenomas.  Next colonoscopy in 5 years.   CYSTOSCOPY W/ URETERAL STENT PLACEMENT Left 09/07/2017   Procedure: CYSTOSCOPY WITH RETROGRADE PYELOGRAM/URETERAL STENT PLACEMENT;  Surgeon: Crista Elliot, MD;  Location: [facility] OR[facility];  Service: Urology;  Laterality: Left;   ESOPHAGOGASTRODUODENOSCOPY (EGD) WITH PROPOFOL N/A 10/12/2019   [place]k: Portal hypertensive gastropathy.  Linear gastric erosions.  No varices.  Biopsy showed reactive gastropathy.  No H. pylori.  Repeat EGD in 2 years to screen for varices.   EYE SURGERY  5 years ago    bilateral cataract extraction    eyelid lift Bilateral    LAPAROSCOPIC NEPHRECTOMY, HAND ASSISTED Left 11/01/2017   Procedure: LEFT HAND ASSISTED LAPAROSCOPIC NEPHRECTOMY;  Surgeon: Crista Elliot, MD;  Location: [facility];  Service: Urology;  Laterality: Left;   MASTECTOMY Left 01/2019   MASTECTOMY MODIFIED RADICAL Left 01/23/2019   Procedure: LEFT MODIFIED RADICAL MASTECTOMY;  Surgeon: Franky Macho, MD;  Location: [facility] OR[facility];  Service: General;  Laterality: Left;   ORIF ANKLE  FRACTURE Right 05/10/2021   Procedure: OPEN REDUCTION INTERNAL FIXATION (ORIF) ANKLE FRACTURE;  Surgeon: Joen Laura, MD;  Location: [facility] OR;  Service: Orthopedics;  Laterality: Right;   POLYPECTOMY  10/12/2019   Procedure: POLYPECTOMY;  Surgeon: Corbin Ade, MD;  Location: [facility];  Service: Endoscopy;;    Social History: Social History   Socioeconomic History   Marital status: Single    Spouse name: Not on file   Number of children: 1   Years of education: Not on file   Highest education level: Not on file  Occupational History   Occupation: disability  Tobacco Use   Smoking status: Every Day    Current packs/day: 0.50    Average packs/day: 0.5 packs/day for 45.0 years (22.5 ttl pk-yrs)    Types: Cigarettes   Smokeless tobacco: Never   Tobacco comments:    smokes 8 cigarettes per day 02/01/2020  Vaping Use   Vaping status: Never Used  Substance and Sexual Activity   Alcohol use: Not Currently    Comment: no prior history heavy etoh use   Drug use: Not Currently   Sexual activity: Not Currently  Other Topics Concern   Not on file  Social History Narrative   Not on file   Social Determinants of Health   Financial Resource Strain: Low Risk  (11/28/2018)   Overall Financial Resource Strain (CARDIA)    Difficulty of Paying Living Expenses: Not hard at all  Food Insecurity: No Food Insecurity (11/28/2018)   Hunger Vital Sign    Worried About Running Out of Food in the Last Year: Never true    Ran Out of Food in the Last Year: Never true  Transportation Needs: No Transportation Needs (11/28/2018)   PRAPARE - Administrator, Civil Service (Medical): No    Lack of Transportation (Non-Medical): No  Physical Activity: Sufficiently Active (11/28/2018)   Exercise Vital Sign    Days of Exercise per Week: 5 days    Minutes of Exercise per Session: 30 min  Stress: No Stress Concern Present (11/28/2018)   [employer] of Occupational Health -  Occupational Stress Questionnaire    Feeling of Stress : Not at all  Social Connections: Moderately Isolated (11/28/2018)   Social Connection and Isolation Panel [NHANES]    Frequency of Communication with Friends and Family: More than three times a week    Frequency of Social Gatherings with Friends and Family: More than three times a week    Attends Religious Services: 1 to 4 times per year    Active Member of [employer] or Organizations: No    Attends Banker Meetings: Never    Marital Status: Never married  Intimate Partner Violence: Not At Risk (11/28/2018)   Humiliation, Afraid, Rape, and Kick questionnaire    Fear of Current or Ex-Partner: No    Emotionally Abused: No    Physically Abused: No    Sexually Abused: No    Family History: Family History  Problem Relation Age of Onset   Colon cancer Mother        died at age 58   Colon cancer Father        died in his 7s   Tremor Father    Melanoma Brother    Colon cancer Paternal Grandmother        age 58   Colon cancer Paternal Grandfather        in his 42s   Liver cancer Maternal Grandfather        died age 51    Current Medications:  Current Outpatient Medications:    acetaminophen (TYLENOL) 500 MG tablet, Take by mouth., Disp: , Rfl:    albuterol (VENTOLIN HFA) 108 (90 Base) MCG/ACT inhaler, Inhale 2 puffs into the lungs as needed for wheezing or shortness of breath., Disp: , Rfl:    anastrozole (ARIMIDEX) 1 MG tablet, TAKE 1 TABLET BY MOUTH EVERY DAY, Disp: 90 tablet, Rfl: 3   atorvastatin (LIPITOR) 40 MG tablet, Take 40 mg by mouth daily. , Disp: , Rfl:    Calcium Carbonate-Vit D-Min (CALCIUM 600+D3 PLUS MINERALS) 600-800 MG-UNIT CHEW, Chew 1 tablet by mouth 2 (two) times daily., Disp: , Rfl:    Cholecalciferol (VITAMIN D) 50 MCG (2000 UT) tablet, Take 2,000 Units by mouth daily., Disp: , Rfl:    clonazePAM (KLONOPIN) 0.5 MG tablet, Take 1 tablet (0.5 mg total) by mouth 2 (two) times daily. (Patient taking  differently: Take 0.5 mg by mouth 2 (two) times daily as needed.), Disp: 60 tablet, Rfl: 0   Denosumab (PROLIA Athena), Inject into the skin every 6 (six) months., Disp: , Rfl:    divalproex (DEPAKOTE) 250 MG DR tablet, Take 250 mg by mouth 3 (three) times daily. Do not crush, Disp: , Rfl:    escitalopram (LEXAPRO) 20 MG tablet, Take 20 mg by mouth daily., Disp: , Rfl:    fluticasone (FLONASE) 50 MCG/ACT nasal spray, Place 2 sprays into both nostrils daily., Disp: , Rfl:    gabapentin (NEURONTIN) 300 MG capsule, Take 300 mg by mouth at bedtime., Disp: , Rfl:    hydrOXYzine (ATARAX/VISTARIL) 25 MG tablet, Take 25 mg by mouth every 8 (eight) hours as needed for itching. , Disp: , Rfl:    levothyroxine (SYNTHROID, LEVOTHROID) 125 MCG tablet, Take 125 mcg by mouth daily before breakfast., Disp: , Rfl:    lisinopril (ZESTRIL) 2.5 MG tablet, Take 2.5 mg by mouth daily., Disp: , Rfl:    metoprolol tartrate (LOPRESSOR) 25 MG tablet, 1/2 tablet with food Orally Twice a day for 90 days, Disp: , Rfl:    Multiple Vitamin (MULTIVITAMIN) tablet, Take 1 tablet by mouth every morning. , Disp: , Rfl:  oxybutynin (DITROPAN-XL) 5 MG 24 hr tablet, Take 5 mg by mouth daily., Disp: , Rfl:    pantoprazole (PROTONIX) 40 MG tablet, TAKE 1 TABLET BY MOUTH EVERY DAY BEFORE BREAKFAST (Patient taking differently: Take 40 mg by mouth daily.), Disp: 90 tablet, Rfl: 3   primidone (MYSOLINE) 50 MG tablet, Take 100 mg by mouth 2 (two) times daily. , Disp: , Rfl:    SPIRIVA HANDIHALER 18 MCG inhalation capsule, 1 capsule daily., Disp: , Rfl:    SYMBICORT 160-4.5 MCG/ACT inhaler, SMARTSIG:2 Puff(s) By Mouth Daily PRN, Disp: , Rfl:    carbidopa-levodopa (SINEMET IR) 25-100 MG tablet, Take 1.5 tablets by mouth 3 (three) times daily., Disp: , Rfl:    Allergies: Allergies  Allergen Reactions   Codeine Nausea And Vomiting    In cough syrup cause nausea and vomiting   Penicillins Hives, Rash and Other (See Comments)    Did it involve  swelling of the face/tongue/throat, SOB, or low BP? Unknown Did it involve sudden or severe rash/hives, skin peeling, or any reaction on the inside of your mouth or nose? Unknown Did you need to seek medical attention at a hospital or doctor's office? Unknown When did it last happen?    as a child If all above answers are "NO", may proceed with cephalosporin use.     REVIEW OF SYSTEMS:   Review of Systems  Constitutional:  Negative for chills, fatigue and fever.  HENT:   Negative for lump/mass, mouth sores, nosebleeds, sore throat and trouble swallowing.   Eyes:  Negative for eye problems.  Respiratory:  Negative for cough and shortness of breath.   Cardiovascular:  Negative for chest pain, leg swelling and palpitations.  Gastrointestinal:  Negative for abdominal pain, constipation, diarrhea, nausea and vomiting.  Genitourinary:  Negative for bladder incontinence, difficulty urinating, dysuria, frequency, hematuria and nocturia.   Musculoskeletal:  Negative for arthralgias, back pain, flank pain, myalgias and neck pain.  Skin:  Negative for itching and rash.  Neurological:  Negative for dizziness, headaches and numbness.  Hematological:  Does not bruise/bleed easily.  Psychiatric/Behavioral:  Negative for depression, sleep disturbance and suicidal ideas. The patient is not nervous/anxious.   All other systems reviewed and are negative.    VITALS:   Blood pressure 117/68, pulse 79, temperature 99.1 F (37.3 C), temperature source Oral, resp. rate 19, weight 154 lb 9 oz (70.1 kg), SpO2 94%.  Wt Readings from Last 3 Encounters:  12/23/22 154 lb 9 oz (70.1 kg)  06/17/22 155 lb 8 oz (70.5 kg)  06/08/22 158 lb 12.8 oz (72 kg)    Body mass index is 26.53 kg/m.  Performance status (ECOG): 1 - Symptomatic but completely ambulatory  PHYSICAL EXAM:   Physical Exam Vitals and nursing note reviewed. Exam conducted with a chaperone present.  Constitutional:      Appearance: Normal  appearance.  Cardiovascular:     Rate and Rhythm: Normal rate and regular rhythm.     Pulses: Normal pulses.     Heart sounds: Normal heart sounds.  Pulmonary:     Effort: Pulmonary effort is normal.     Breath sounds: Normal breath sounds.  Abdominal:     Palpations: Abdomen is soft. There is no hepatomegaly, splenomegaly or mass.     Tenderness: There is no abdominal tenderness.  Musculoskeletal:     Right lower leg: No edema.     Left lower leg: No edema.  Lymphadenopathy:     Cervical: No cervical adenopathy.  Right cervical: No superficial, deep or posterior cervical adenopathy.    Left cervical: No superficial, deep or posterior cervical adenopathy.     Upper Body:     Right upper body: No supraclavicular or axillary adenopathy.     Left upper body: No supraclavicular or axillary adenopathy.  Neurological:     General: No focal deficit present.     Mental Status: She is alert and oriented to person, place, and time.  Psychiatric:        Mood and Affect: Mood normal.        Behavior: Behavior normal.     LABS:      Latest Ref Rng & Units 12/16/2022    2:49 PM 06/17/2022   10:03 AM 05/13/2022    1:37 PM  CBC  WBC 4.0 - 10.5 K/uL 6.7  6.8  6.1   Hemoglobin 12.0 - 15.0 g/dL 16.1  09.6  04.5   Hematocrit 36.0 - 46.0 % 40.1  42.8  41.7   Platelets 150 - 400 K/uL 147  145  139       Latest Ref Rng & Units 12/16/2022    2:49 PM 06/17/2022   10:03 AM 05/13/2022    1:37 PM  CMP  Glucose 70 - 99 mg/dL 409  811  914   BUN 8 - 23 mg/dL 13  19  18    Creatinine 0.44 - 1.00 mg/dL 7.82  9.56  2.13   Sodium 135 - 145 mmol/L 137  138  136   Potassium 3.5 - 5.1 mmol/L 4.4  4.6  4.2   Chloride 98 - 111 mmol/L 101  103  104   CO2 22 - 32 mmol/L 27  26  26    Calcium 8.9 - 10.3 mg/dL 8.9  9.3  9.1   Total Protein 6.5 - 8.1 g/dL 6.4  6.7  6.6   Total Bilirubin 0.3 - 1.2 mg/dL 0.5  0.9  0.9   Alkaline Phos 38 - 126 U/L 35  43  35   AST 15 - 41 U/L 23  21  21    ALT 0 - 44 U/L 19   6  11       No results found for: "CEA1", "CEA" / No results found for: "CEA1", "CEA" No results found for: "PSA1" No results found for: "YQM578" No results found for: "CAN125"  No results found for: "TOTALPROTELP", "ALBUMINELP", "A1GS", "A2GS", "BETS", "BETA2SER", "GAMS", "MSPIKE", "SPEI" Lab Results  Component Value Date   TIBC 340 12/23/2018   FERRITIN 107 12/23/2018   IRONPCTSAT 26 12/23/2018   No results found for: "LDH"   STUDIES:   MM 3D SCREEN BREAST UNI RIGHT  Result Date: 12/22/2022 CLINICAL DATA:  Screening. EXAM: DIGITAL SCREENING UNILATERAL RIGHT MAMMOGRAM WITH CAD AND TOMOSYNTHESIS TECHNIQUE: Right screening digital craniocaudal and mediolateral oblique mammograms were obtained. Right screening digital breast tomosynthesis was performed. The images were evaluated with computer-aided detection. COMPARISON:  Previous exam(s). ACR Breast Density Category b: There are scattered areas of fibroglandular density. FINDINGS: The patient has had a left mastectomy. There are no findings suspicious for malignancy. IMPRESSION: No mammographic evidence of malignancy. A result letter of this screening mammogram will be mailed directly to the patient. RECOMMENDATION: Screening mammogram in one year.  (Code:SM-R-30M) BI-RADS CATEGORY  1: Negative. Electronically Signed   By: Frederico Hamman M.D.   On: 12/22/2022 16:59

## 2022-12-23 ENCOUNTER — Inpatient Hospital Stay: Payer: 59 | Admitting: Hematology

## 2022-12-23 ENCOUNTER — Inpatient Hospital Stay: Payer: 59

## 2022-12-23 VITALS — BP 117/68 | HR 79 | Temp 99.1°F | Resp 19 | Wt 154.6 lb

## 2022-12-23 DIAGNOSIS — C50212 Malignant neoplasm of upper-inner quadrant of left female breast: Secondary | ICD-10-CM | POA: Diagnosis not present

## 2022-12-23 DIAGNOSIS — C50912 Malignant neoplasm of unspecified site of left female breast: Secondary | ICD-10-CM | POA: Diagnosis not present

## 2022-12-23 DIAGNOSIS — Z170 Estrogen receptor positive status [ER+]: Secondary | ICD-10-CM

## 2022-12-23 DIAGNOSIS — Z1231 Encounter for screening mammogram for malignant neoplasm of breast: Secondary | ICD-10-CM | POA: Diagnosis not present

## 2022-12-23 DIAGNOSIS — M8580 Other specified disorders of bone density and structure, unspecified site: Secondary | ICD-10-CM

## 2022-12-23 MED ORDER — DENOSUMAB 60 MG/ML ~~LOC~~ SOSY
60.0000 mg | PREFILLED_SYRINGE | Freq: Once | SUBCUTANEOUS | Status: AC
  Administered 2022-12-23: 60 mg via SUBCUTANEOUS
  Filled 2022-12-23: qty 1

## 2022-12-23 NOTE — Patient Instructions (Addendum)
[facility]  Discharge Instructions  You were seen and examined today by Dr. Ellin Saba.  Dr. Ellin Saba discussed your most recent lab work and mammogram which revealed that everything looks good and stable.  Follow-up as scheduled in 1 year with labs and mammogram.    Thank you for choosing [facility] to provide your oncology and hematology care.   To afford each patient quality time with our provider, please arrive at least 15 minutes before your scheduled appointment time. You may need to reschedule your appointment if you arrive late (10 or more minutes). Arriving late affects you and other patients whose appointments are after yours.  Also, if you miss three or more appointments without notifying the office, you may be dismissed from the clinic at the provider's discretion.    Again, thank you for choosing [facility].  Our hope is that these requests will decrease the amount of time that you wait before being seen by our physicians.   If you have a lab appointment with the Cancer Center - please note that after April 8th, all labs will be drawn in the cancer center.  You do not have to check in or register with the main entrance as you have in the past but will complete your check-in at the cancer center.            _____________________________________________________________  Should you have questions after your visit to [facility], please contact our office at [phone] and follow the prompts.  Our office hours are 8:00 a.m. to 4:30 p.m. Monday - Thursday and 8:00 a.m. to 2:30 p.m. Friday.  Please note that voicemails left after 4:00 p.m. may not be returned until the following business day.  We are closed weekends and all major holidays.  You do have access to a nurse 24-7, just call the main number to the clinic [phone] and do not press any options, hold on the line and a nurse will answer  the phone.    For prescription refill requests, have your pharmacy contact our office and allow 72 hours.    Masks are no longer required in the cancer centers. If you would like for your care team to wear a mask while they are taking care of you, please let them know. You may have one support person who is at least 72 years old accompany you for your appointments.

## 2022-12-23 NOTE — Progress Notes (Signed)
Patient presents today for Prolia injection.  Patient is in satisfactory condition with no new complaints voiced.  Vital signs are stable.  Labs reviewed by Dr. Ellin Saba during her office visit.  Calcium today is 8.9.  We will proceed with injection per MD orders.   Patient tolerated injection well with no complaints voiced.  Patient left ambulatory with sister in stable condition.  Vital signs stable at discharge.  Follow up as scheduled.

## 2022-12-23 NOTE — Patient Instructions (Signed)
[facility]  Discharge Instructions: Thank you for choosing [facility] to provide your oncology and hematology care.  If you have a lab appointment with the Cancer Center - please note that after April 8th, 2024, all labs will be drawn in the cancer center.  You do not have to check in or register with the main entrance as you have in the past but will complete your check-in in the cancer center.  Wear comfortable clothing and clothing appropriate for easy access to any Portacath or PICC line.   We strive to give you quality time with your provider. You may need to reschedule your appointment if you arrive late (15 or more minutes).  Arriving late affects you and other patients whose appointments are after yours.  Also, if you miss three or more appointments without notifying the office, you may be dismissed from the clinic at the provider's discretion.      For prescription refill requests, have your pharmacy contact our office and allow 72 hours for refills to be completed.    Today you received the following chemotherapy and/or immunotherapy agents Prolia.  Denosumab Injection (Osteoporosis) What is this medication? DENOSUMAB (den oh SUE mab) prevents and treats osteoporosis. It works by Interior and spatial designer stronger and less likely to break (fracture). It is a monoclonal antibody. This medicine may be used for other purposes; ask your health care provider or pharmacist if you have questions. COMMON BRAND NAME(S): Prolia What should I tell my care team before I take this medication? They need to know if you have any of these conditions: Dental or gum disease Had thyroid or parathyroid (glands located in neck) surgery Having dental surgery or a tooth pulled Kidney disease Low levels of calcium in the blood On dialysis Poor nutrition Thyroid disease Trouble absorbing nutrients from your food An unusual or allergic reaction to denosumab, other  medications, foods, dyes, or preservatives Pregnant or trying to get pregnant Breastfeeding How should I use this medication? This medication is injected under the skin. It is given by your care team in a hospital or clinic setting. A special MedGuide will be given to you before each treatment. Be sure to read this information carefully each time. Talk to your care team about the use of this medication in children. Special care may be needed. Overdosage: If you think you have taken too much of this medicine contact a poison control center or emergency room at once. NOTE: This medicine is only for you. Do not share this medicine with others. What if I miss a dose? Keep appointments for follow-up doses. It is important not to miss your dose. Call your care team if you are unable to keep an appointment. What may interact with this medication? Do not take this medication with any of the following: Other medications that contain denosumab This medication may also interact with the following: Medications that lower your chance of fighting infection Steroid medications, such as prednisone or cortisone This list may not describe all possible interactions. Give your health care provider a list of all the medicines, herbs, non-prescription drugs, or dietary supplements you use. Also tell them if you smoke, drink alcohol, or use illegal drugs. Some items may interact with your medicine. What should I watch for while using this medication? Your condition will be monitored carefully while you are receiving this medication. You may need blood work done while taking this medication. This medication may increase your risk of getting  an infection. Call your care team for advice if you get a fever, chills, sore throat, or other symptoms of a cold or flu. Do not treat yourself. Try to avoid being around people who are sick. Tell your dentist and dental surgeon that you are taking this medication. You should not  have major dental surgery while on this medication. See your dentist to have a dental exam and fix any dental problems before starting this medication. Take good care of your teeth while on this medication. Make sure you see your dentist for regular follow-up appointments. This medication may cause low levels of calcium in your body. The risk of severe side effects is increased in people with kidney disease. Your care team may prescribe calcium and vitamin D to help prevent low calcium levels while you take this medication. It is important to take calcium and vitamin D as directed by your care team. Talk to your care team if you may be pregnant. Serious birth defects may occur if you take this medication during pregnancy and for 5 months after the last dose. You will need a negative pregnancy test before starting this medication. Contraception is recommended while taking this medication and for 5 months after the last dose. Your care team can help you find the option that works for you. Talk to your care team before breastfeeding. Changes to your treatment plan may be needed. What side effects may I notice from receiving this medication? Side effects that you should report to your care team as soon as possible: Allergic reactions--skin rash, itching, hives, swelling of the face, lips, tongue, or throat Infection--fever, chills, cough, sore throat, wounds that don't heal, pain or trouble when passing urine, general feeling of discomfort or being unwell Low calcium level--muscle pain or cramps, confusion, tingling, or numbness in the hands or feet Osteonecrosis of the jaw--pain, swelling, or redness in the mouth, numbness of the jaw, poor healing after dental work, unusual discharge from the mouth, visible bones in the mouth Severe bone, joint, or muscle pain Skin infection--skin redness, swelling, warmth, or pain Side effects that usually do not require medical attention (report these to your care team if  they continue or are bothersome): Back pain Headache Joint pain Muscle pain Pain in the hands, arms, legs, or feet Runny or stuffy nose Sore throat This list may not describe all possible side effects. Call your doctor for medical advice about side effects. You may report side effects to FDA at 1-800-FDA-1088. Where should I keep my medication? This medication is given in a hospital or clinic. It will not be stored at home. NOTE: This sheet is a summary. It may not cover all possible information. If you have questions about this medicine, talk to your doctor, pharmacist, or health care provider.  2024 Elsevier/Gold Standard (2022-05-26 00:00:00)        To help prevent nausea and vomiting after your treatment, we encourage you to take your nausea medication as directed.  BELOW ARE SYMPTOMS THAT SHOULD BE REPORTED IMMEDIATELY: *FEVER GREATER THAN 100.4 F (38 C) OR HIGHER *CHILLS OR SWEATING *NAUSEA AND VOMITING THAT IS NOT CONTROLLED WITH YOUR NAUSEA MEDICATION *UNUSUAL SHORTNESS OF BREATH *UNUSUAL BRUISING OR BLEEDING *URINARY PROBLEMS (pain or burning when urinating, or frequent urination) *BOWEL PROBLEMS (unusual diarrhea, constipation, pain near the anus) TENDERNESS IN MOUTH AND THROAT WITH OR WITHOUT PRESENCE OF ULCERS (sore throat, sores in mouth, or a toothache) UNUSUAL RASH, SWELLING OR PAIN  UNUSUAL VAGINAL DISCHARGE OR ITCHING  Items with * indicate a potential emergency and should be followed up as soon as possible or go to the Emergency Department if any problems should occur.  Please show the CHEMOTHERAPY ALERT CARD or IMMUNOTHERAPY ALERT CARD at check-in to the Emergency Department and triage nurse.  Should you have questions after your visit or need to cancel or reschedule your appointment, please contact [facility] [phone]  and follow the prompts.  Office hours are 8:00 a.m. to 4:30 p.m. Monday - Friday. Please note that voicemails left  after 4:00 p.m. may not be returned until the following business day.  We are closed weekends and major holidays. You have access to a nurse at all times for urgent questions. Please call the main number to the clinic [phone] and follow the prompts.  For any non-urgent questions, you may also contact your provider using MyChart. We now offer e-Visits for anyone 50 and older to request care online for non-urgent symptoms. For details visit [email or web address].   Also download the MyChart app! Go to the app store, search "MyChart", open the app, select [place], and log in with your MyChart username and password.

## 2022-12-25 ENCOUNTER — Encounter: Payer: Self-pay | Admitting: *Deleted

## 2022-12-25 ENCOUNTER — Encounter: Payer: Self-pay | Admitting: Gastroenterology

## 2022-12-25 ENCOUNTER — Ambulatory Visit (INDEPENDENT_AMBULATORY_CARE_PROVIDER_SITE_OTHER): Payer: 59 | Admitting: Gastroenterology

## 2022-12-25 VITALS — BP 124/77 | HR 67 | Temp 98.9°F | Ht 64.0 in | Wt 154.6 lb

## 2022-12-25 DIAGNOSIS — K7460 Unspecified cirrhosis of liver: Secondary | ICD-10-CM | POA: Diagnosis not present

## 2022-12-25 DIAGNOSIS — K219 Gastro-esophageal reflux disease without esophagitis: Secondary | ICD-10-CM | POA: Diagnosis not present

## 2022-12-25 NOTE — Patient Instructions (Signed)
Continue pantoprazole 40 mg daily before breakfast. We will schedule you for ultrasound of your liver.  When you are at the hospital that day, please complete blood work. We would determine timing of your next upper endoscopy based on labs.   It was a pleasure to see you today.  I truly value your feedback, so please be on the lookout for a survey regarding your visit with me today. I appreciate your time in completing this!

## 2022-12-25 NOTE — Progress Notes (Unsigned)
GI Office Note    Referring Provider: Doreatha Massed, MD Primary Care Physician:  Doreatha Massed, MD  Primary Gastroenterologist: Hennie Duos. Marletta Lor, DO   Chief Complaint   Chief Complaint  Patient presents with   Follow-up    Doing well, no issues.    History of Present Illness   Laurie Moreno is a 72 y.o. female presenting today for follow-up.  Last seen February 2024.  History of cirrhosis felt to be due to NASH, GERD, perianal lesion.  Right upper quadrant ultrasound July 2023: -5.8 mm polyp in the gallbladder -Increased echogenicity in the liver nonspecific but may be due to reported cirrhosis.  She had an EGD and colonoscopy in June 2021.  She had mild portal hypertensive gastropathy, linear gastric erosions.  No varices.  Biopsy showed reactive gastropathy but no H. pylori.  Plans for repeat EGD in 2 years to screen for varices.  On colonoscopy she had 1.5 cm verrucous, raised nodule upper gluteal fold, scattered diverticula, 3 sessile tubular adenomas removed. Plans for next colonoscopy in 5 years.  She was advised to see dermatology for the nodule noted near the upper end of the gluteal folds.    I reviewed dermatology records regarding perianal lesion.  She was given Aldara cream to apply to the lesion.  Patient reports she wanted to try the cream more consistently but if it was not effective that she wanted to pursue general surgery consult.   Today: doing well. No complaints. Stools regular on metamucil. No melena, brbpr. No heartburn, dysphagia, n/v. No episodes of confusion. Appetite is good.    Medications   Current Outpatient Medications  Medication Sig Dispense Refill   acetaminophen (TYLENOL) 500 MG tablet Take by mouth.     albuterol (VENTOLIN HFA) 108 (90 Base) MCG/ACT inhaler Inhale 2 puffs into the lungs as needed for wheezing or shortness of breath.     anastrozole (ARIMIDEX) 1 MG tablet TAKE 1 TABLET BY MOUTH EVERY DAY 90 tablet 3    atorvastatin (LIPITOR) 40 MG tablet Take 40 mg by mouth daily.      Calcium Carbonate-Vit D-Min (CALCIUM 600+D3 PLUS MINERALS) 600-800 MG-UNIT CHEW Chew 1 tablet by mouth 2 (two) times daily.     carbidopa-levodopa (SINEMET IR) 25-100 MG tablet Take 1.5 tablets by mouth 3 (three) times daily.     Cholecalciferol (VITAMIN D) 50 MCG (2000 UT) tablet Take 2,000 Units by mouth daily.     clonazePAM (KLONOPIN) 0.5 MG tablet Take 1 tablet (0.5 mg total) by mouth 2 (two) times daily. (Patient taking differently: Take 0.5 mg by mouth 2 (two) times daily as needed.) 60 tablet 0   Denosumab (PROLIA [place]) Inject into the skin every 6 (six) months.     divalproex (DEPAKOTE) 250 MG DR tablet Take 250 mg by mouth 3 (three) times daily. Do not crush     escitalopram (LEXAPRO) 20 MG tablet Take 20 mg by mouth daily.     fluticasone (FLONASE) 50 MCG/ACT nasal spray Place 2 sprays into both nostrils daily.     gabapentin (NEURONTIN) 300 MG capsule Take 300 mg by mouth at bedtime.     hydrOXYzine (ATARAX/VISTARIL) 25 MG tablet Take 25 mg by mouth every 8 (eight) hours as needed for itching.      levothyroxine (SYNTHROID, LEVOTHROID) 125 MCG tablet Take 125 mcg by mouth daily before breakfast.     lisinopril (ZESTRIL) 2.5 MG tablet Take 2.5 mg by mouth daily.  metoprolol tartrate (LOPRESSOR) 25 MG tablet Take 25 mg by mouth 2 (two) times daily.     Multiple Vitamin (MULTIVITAMIN) tablet Take 1 tablet by mouth every morning.      oxybutynin (DITROPAN-XL) 5 MG 24 hr tablet Take 5 mg by mouth daily.     pantoprazole (PROTONIX) 40 MG tablet TAKE 1 TABLET BY MOUTH EVERY DAY BEFORE BREAKFAST (Patient taking differently: Take 40 mg by mouth daily.) 90 tablet 3   primidone (MYSOLINE) 50 MG tablet Take 100 mg by mouth 2 (two) times daily.      SPIRIVA HANDIHALER 18 MCG inhalation capsule 1 capsule daily.     SYMBICORT 160-4.5 MCG/ACT inhaler SMARTSIG:2 Puff(s) By Mouth Daily PRN     No current facility-administered  medications for this visit.    Allergies   Allergies as of 12/25/2022 - Review Complete 12/25/2022  Allergen Reaction Noted   Codeine Nausea And Vomiting 04/02/2011   Penicillins Hives, Rash, and Other (See Comments) 04/02/2011     Past Medical History   Past Medical History:  Diagnosis Date   Acute pyelonephritis    Anxiety    Bipolar disorder, unspecified (HCC)    From [place] form   Breast cancer (HCC) 01/2019   INVASIVE CARCINOMA OF THE BREAST   CHF (congestive heart failure) (HCC)    see LOV 06-25-16 epic care everywhere ; "sister reports that patient has a 'weak heart'  but they did testing and she hasnt had to go back since"   Chronic respiratory failure with hypoxia (HCC)    Complication of anesthesia    reports with last surgery on 09-07-17 it took 3 hours for her to wake up and she was on the ventilator post operatively for an additional  8 hours    COPD (chronic obstructive pulmonary disease) (HCC)    Dermatitis    Dermatitis    Dysrhythmia    Elevated temperature 10/26/2017   temp of 99.9 , denies cold/flu sx; reports she gets frwquent UTIs, patient sister accompanying today  reports she is going to Dr Alvester Morin (surgeon) office today  for pre-op appt  at 2pm   Essential tremor    Family history of adverse reaction to anesthesia    sister gets nausea and vomiting    Fever    History of ESBL E. coli infection    10-26-17 this RN did not enter this ESBL inf hx; patient and sister deny hx of ESBL infection and there is no supporting lab work for ESBL infection in epic    Hyperlipidemia    Hypothyroidism    Thrombocytopenia (HCC)    Unspecified hydronephrosis    Urinary incontinence     Past Surgical History   Past Surgical History:  Procedure Laterality Date   BIOPSY  10/12/2019   Procedure: BIOPSY;  Surgeon: Corbin Ade, MD;  Location: [facility];  Service: Endoscopy;;   CARDIAC CATHETERIZATION  07/15/2016   at [facility]   COLONOSCOPY  12/2013   Dr.  Arlys John C. Fabienne Nolasco at [facility], long tortuous colon, nonbleeding external hemorrhoids, questionable small subcentimeter benign/hyperplastic polyp in the ascending colon, not removed.  Advised to have repeat colonoscopy in 4 years.   COLONOSCOPY WITH PROPOFOL N/A 10/12/2019   [place]: 1.5 cm verrucous, raised nodule upper end of her gluteal folds.  Scattered diverticula.  3 sessile polyps removed, tubular adenomas.  Next colonoscopy in 5 years.   CYSTOSCOPY W/ URETERAL STENT PLACEMENT Left 09/07/2017   Procedure: CYSTOSCOPY  WITH RETROGRADE PYELOGRAM/URETERAL STENT PLACEMENT;  Surgeon: Crista Elliot, MD;  Location: [facility];  Service: Urology;  Laterality: Left;   ESOPHAGOGASTRODUODENOSCOPY (EGD) WITH PROPOFOL N/A 10/12/2019   [place]: Portal hypertensive gastropathy.  Linear gastric erosions.  No varices.  Biopsy showed reactive gastropathy.  No H. pylori.  Repeat EGD in 2 years to screen for varices.   EYE SURGERY  5 years ago    bilateral cataract extraction    eyelid lift Bilateral    LAPAROSCOPIC NEPHRECTOMY, HAND ASSISTED Left 11/01/2017   Procedure: LEFT HAND ASSISTED LAPAROSCOPIC NEPHRECTOMY;  Surgeon: Crista Elliot, MD;  Location: [facility];  Service: Urology;  Laterality: Left;   MASTECTOMY Left 01/2019   MASTECTOMY MODIFIED RADICAL Left 01/23/2019   Procedure: LEFT MODIFIED RADICAL MASTECTOMY;  Surgeon: Franky Macho, MD;  Location: [facility];  Service: General;  Laterality: Left;   ORIF ANKLE FRACTURE Right 05/10/2021   Procedure: OPEN REDUCTION INTERNAL FIXATION (ORIF) ANKLE FRACTURE;  Surgeon: Joen Laura, MD;  Location: [facility];  Service: Orthopedics;  Laterality: Right;   POLYPECTOMY  10/12/2019   Procedure: POLYPECTOMY;  Surgeon: Corbin Ade, MD;  [facility]: [facility];  Service: Endoscopy;;    Past Family History   Family History  Problem Relation Age of Onset   Colon cancer Mother        died at age 43   Colon cancer Father         died in his 17s   Tremor Father    Melanoma Brother    Colon cancer Paternal Grandmother        age 81   Colon cancer Paternal Grandfather        in his 34s   Liver cancer Maternal Grandfather        died age 66    Past Social History   Social History   Socioeconomic History   Marital status: Single    Spouse name: Not on file   Number of children: 1   Years of education: Not on file   Highest education level: Not on file  Occupational History   Occupation: disability  Tobacco Use   Smoking status: Every Day    Current packs/day: 0.50    Average packs/day: 0.5 packs/day for 45.0 years (22.5 ttl pk-yrs)    Types: Cigarettes   Smokeless tobacco: Never   Tobacco comments:    smokes 8 cigarettes per day 02/01/2020  Vaping Use   Vaping status: Never Used  Substance and Sexual Activity   Alcohol use: Not Currently    Comment: no prior history heavy etoh use   Drug use: Not Currently   Sexual activity: Not Currently  Other Topics Concern   Not on file  Social History Narrative   Not on file   Social Determinants of Health   Financial Resource Strain: Low Risk  (11/28/2018)   Overall Financial Resource Strain (CARDIA)    Difficulty of Paying Living Expenses: Not hard at all  Food Insecurity: No Food Insecurity (11/28/2018)   Hunger Vital Sign    Worried About Running Out of Food in the Last Year: Never true    Ran Out of Food in the Last Year: Never true  Transportation Needs: No Transportation Needs (11/28/2018)   PRAPARE - Administrator, Civil Service (Medical): No    Lack of Transportation (Non-Medical): No  Physical Activity: Sufficiently Active (11/28/2018)   Exercise Vital Sign    Days of Exercise per Week:  5 days    Minutes of Exercise per Session: 30 min  Stress: No Stress Concern Present (11/28/2018)   [employer] of Occupational Health - Occupational Stress Questionnaire    Feeling of Stress : Not at all  Social Connections: Moderately  Isolated (11/28/2018)   Social Connection and Isolation Panel [NHANES]    Frequency of Communication with Friends and Family: More than three times a week    Frequency of Social Gatherings with Friends and Family: More than three times a week    Attends Religious Services: 1 to 4 times per year    Active Member of [employer] or Organizations: No    Attends Banker Meetings: Never    Marital Status: Never married  Intimate Partner Violence: Not At Risk (11/28/2018)   Humiliation, Afraid, Rape, and Kick questionnaire    Fear of Current or Ex-Partner: No    Emotionally Abused: No    Physically Abused: No    Sexually Abused: No    Review of Systems   General: Negative for anorexia, weight loss, fever, chills, fatigue, weakness. ENT: Negative for hoarseness, difficulty swallowing , nasal congestion. CV: Negative for chest pain, angina, palpitations, dyspnea on exertion, peripheral edema.  Respiratory: Negative for dyspnea at rest, dyspnea on exertion, cough, sputum, wheezing.  GI: See history of present illness. GU:  Negative for dysuria, hematuria, urinary incontinence, urinary frequency, nocturnal urination.  Endo: Negative for unusual weight change.     Physical Exam   BP 124/77 (BP Location: Right Arm, Patient Position: Sitting, Cuff Size: Normal)   Pulse 67   Temp 98.9 F (37.2 C) (Oral)   Ht 5\' 4"  (1.626 m)   Wt 154 lb 9.6 oz (70.1 kg)   SpO2 95%   BMI 26.54 kg/m    General: Well-nourished, well-developed in no acute distress.  Eyes: No icterus. Mouth: Oropharyngeal mucosa moist and pink   Lungs: Clear to auscultation bilaterally.  Heart: Regular rate and rhythm, no murmurs rubs or gallops.  Abdomen: Bowel sounds are normal, nontender, nondistended, no hepatosplenomegaly or masses,  no abdominal bruits or hernia , no rebound or guarding.  Rectal: not performed Extremities: No lower extremity edema. No clubbing or deformities. Neuro: Alert and oriented x 4    Skin: Warm and dry, no jaundice.   Psych: Alert and cooperative, normal mood and affect.  Labs   Lab Results  Component Value Date   NA 137 12/16/2022   CL 101 12/16/2022   K 4.4 12/16/2022   CO2 27 12/16/2022   BUN 13 12/16/2022   CREATININE 0.83 12/16/2022   GFRNONAA >60 12/16/2022   CALCIUM 8.9 12/16/2022   PHOS 4.5 05/11/2021   ALBUMIN 4.0 12/16/2022   GLUCOSE 134 (H) 12/16/2022   Lab Results  Component Value Date   ALT 19 12/16/2022   AST 23 12/16/2022   ALKPHOS 35 (L) 12/16/2022   BILITOT 0.5 12/16/2022   Lab Results  Component Value Date   WBC 6.7 12/16/2022   HGB 13.4 12/16/2022   HCT 40.1 12/16/2022   MCV 92.4 12/16/2022   PLT 147 (L) 12/16/2022   Lab Results  Component Value Date   INR 1.2 06/17/2022   INR 1.2 10/31/2021   INR 1.1 10/24/2020    Imaging Studies   MM 3D SCREEN BREAST UNI RIGHT  Result Date: 12/22/2022 CLINICAL DATA:  Screening. EXAM: DIGITAL SCREENING UNILATERAL RIGHT MAMMOGRAM WITH CAD AND TOMOSYNTHESIS TECHNIQUE: Right screening digital craniocaudal and mediolateral oblique mammograms were obtained. Right screening digital  breast tomosynthesis was performed. The images were evaluated with computer-aided detection. COMPARISON:  Previous exam(s). ACR Breast Density Category b: There are scattered areas of fibroglandular density. FINDINGS: The patient has had a left mastectomy. There are no findings suspicious for malignancy. IMPRESSION: No mammographic evidence of malignancy. A result letter of this screening mammogram will be mailed directly to the patient. RECOMMENDATION: Screening mammogram in one year.  (Code:SM-R-7M) BI-RADS CATEGORY  1: Negative. Electronically Signed   By: Frederico Hamman M.D.   On: 12/22/2022 16:59    Assessment   *GERD *Cirrhosis  GERD is well-controlled on PPI.  Cirrhosis remains well compensated.  She is due for labs and hepatoma screening.  Once completed we will make arrangements for EGD for esophageal  variceal bleed platelets <150,000 or any other evidence of portal hypertension.   PLAN   Continue pantoprazole 40 mg daily before breakfast. Abdominal ultrasound. Update labs.   Leanna Battles. Melvyn Neth, MHS, PA-C [facility]

## 2022-12-26 ENCOUNTER — Encounter: Payer: Self-pay | Admitting: Gastroenterology

## 2022-12-31 ENCOUNTER — Encounter (HOSPITAL_COMMUNITY): Payer: Self-pay | Admitting: Hematology

## 2023-01-05 ENCOUNTER — Ambulatory Visit (HOSPITAL_COMMUNITY): Payer: 59 | Attending: Gastroenterology

## 2023-01-21 ENCOUNTER — Ambulatory Visit (HOSPITAL_COMMUNITY): Payer: 59

## 2023-01-23 ENCOUNTER — Other Ambulatory Visit: Payer: Self-pay | Admitting: Hematology

## 2023-01-23 DIAGNOSIS — Z170 Estrogen receptor positive status [ER+]: Secondary | ICD-10-CM

## 2023-01-25 ENCOUNTER — Other Ambulatory Visit: Payer: Self-pay | Admitting: *Deleted

## 2023-01-25 ENCOUNTER — Encounter (HOSPITAL_COMMUNITY): Payer: Self-pay | Admitting: Hematology

## 2023-01-25 NOTE — Telephone Encounter (Signed)
Anastrozole refill approved.  Patient is tolerating and is to continue therapy.

## 2023-02-16 ENCOUNTER — Ambulatory Visit (HOSPITAL_COMMUNITY)
Admission: RE | Admit: 2023-02-16 | Discharge: 2023-02-16 | Disposition: A | Discharge status: Home / Self Care | Payer: 59 | Source: Ambulatory Visit | Attending: Family Medicine | Admitting: Family Medicine

## 2023-02-16 DIAGNOSIS — Z122 Encounter for screening for malignant neoplasm of respiratory organs: Secondary | ICD-10-CM | POA: Insufficient documentation

## 2023-02-16 DIAGNOSIS — Z720 Tobacco use: Secondary | ICD-10-CM | POA: Insufficient documentation

## 2023-02-16 DIAGNOSIS — I2510 Atherosclerotic heart disease of native coronary artery without angina pectoris: Secondary | ICD-10-CM | POA: Diagnosis not present

## 2023-02-16 DIAGNOSIS — J439 Emphysema, unspecified: Secondary | ICD-10-CM | POA: Diagnosis not present

## 2023-02-16 DIAGNOSIS — F17210 Nicotine dependence, cigarettes, uncomplicated: Secondary | ICD-10-CM | POA: Insufficient documentation

## 2023-02-16 DIAGNOSIS — I700 Atherosclerosis of aorta: Secondary | ICD-10-CM | POA: Insufficient documentation

## 2023-02-22 ENCOUNTER — Ambulatory Visit (HOSPITAL_COMMUNITY): Admission: RE | Admit: 2023-02-22 | Payer: 59 | Source: Ambulatory Visit

## 2023-02-22 ENCOUNTER — Ambulatory Visit (HOSPITAL_COMMUNITY): Payer: 59

## 2023-02-23 ENCOUNTER — Encounter (HOSPITAL_COMMUNITY): Payer: Self-pay | Admitting: Hematology

## 2023-02-24 ENCOUNTER — Encounter (HOSPITAL_COMMUNITY): Payer: Self-pay | Admitting: Hematology

## 2023-02-25 ENCOUNTER — Other Ambulatory Visit (HOSPITAL_COMMUNITY)
Admission: RE | Admit: 2023-02-25 | Discharge: 2023-02-25 | Disposition: A | Discharge status: Home / Self Care | Payer: 59 | Source: Ambulatory Visit | Attending: Gastroenterology | Admitting: Gastroenterology

## 2023-02-25 ENCOUNTER — Ambulatory Visit (HOSPITAL_COMMUNITY)
Admission: RE | Admit: 2023-02-25 | Discharge: 2023-02-25 | Disposition: A | Discharge status: Home / Self Care | Payer: 59 | Source: Ambulatory Visit | Attending: Gastroenterology | Admitting: Gastroenterology

## 2023-02-25 DIAGNOSIS — K7460 Unspecified cirrhosis of liver: Secondary | ICD-10-CM | POA: Diagnosis present

## 2023-02-25 DIAGNOSIS — K219 Gastro-esophageal reflux disease without esophagitis: Secondary | ICD-10-CM | POA: Insufficient documentation

## 2023-02-25 LAB — COMPREHENSIVE METABOLIC PANEL
ALT: 23 U/L (ref 0–44)
AST: 20 U/L (ref 15–41)
Albumin: 4 g/dL (ref 3.5–5.0)
Alkaline Phosphatase: 33 U/L — ABNORMAL LOW (ref 38–126)
Anion gap: 9 (ref 5–15)
BUN: 18 mg/dL (ref 8–23)
CO2: 26 mmol/L (ref 22–32)
Calcium: 8.3 mg/dL — ABNORMAL LOW (ref 8.9–10.3)
Chloride: 103 mmol/L (ref 98–111)
Creatinine, Ser: 0.72 mg/dL (ref 0.44–1.00)
GFR, Estimated: >60 mL/min (ref >60)
Glucose, Bld: 126 mg/dL — ABNORMAL HIGH (ref 70–99)
Potassium: 4.6 mmol/L (ref 3.5–5.1)
Sodium: 138 mmol/L (ref 135–145)
Total Bilirubin: 0.6 mg/dL (ref 0.3–1.2)
Total Protein: 6.6 g/dL (ref 6.5–8.1)

## 2023-02-25 LAB — PROTIME-INR
INR: 1.1 (ref 0.8–1.2)
Prothrombin Time: 14.4 s (ref 11.4–15.2)

## 2023-02-26 LAB — AFP TUMOR MARKER: AFP, Serum, Tumor Marker: <1.8 ng/mL (ref 0.0–9.2)

## 2023-05-26 ENCOUNTER — Other Ambulatory Visit (HOSPITAL_COMMUNITY): Payer: Self-pay | Admitting: Family Medicine

## 2023-05-26 DIAGNOSIS — R1084 Generalized abdominal pain: Secondary | ICD-10-CM

## 2023-06-02 ENCOUNTER — Ambulatory Visit (HOSPITAL_COMMUNITY)
Admission: RE | Admit: 2023-06-02 | Discharge: 2023-06-02 | Disposition: A | Discharge status: Home / Self Care | Payer: 59 | Source: Ambulatory Visit | Attending: Family Medicine | Admitting: Family Medicine

## 2023-06-02 DIAGNOSIS — R1084 Generalized abdominal pain: Secondary | ICD-10-CM | POA: Diagnosis present

## 2023-06-25 ENCOUNTER — Inpatient Hospital Stay: Payer: 59

## 2023-06-25 ENCOUNTER — Ambulatory Visit: Payer: 59

## 2023-06-29 ENCOUNTER — Other Ambulatory Visit: Payer: Self-pay | Admitting: *Deleted

## 2023-06-29 ENCOUNTER — Telehealth: Payer: Self-pay

## 2023-06-29 ENCOUNTER — Telehealth: Payer: Self-pay | Admitting: *Deleted

## 2023-06-29 MED ORDER — ALENDRONATE SODIUM 70 MG PO TABS: 70.0000 mg | ORAL_TABLET | ORAL | 0 refills | Status: DC

## 2023-06-29 NOTE — Telephone Encounter (Signed)
 Patient called concerning the cost of her Prolia, therefore per Dr. Ellin Saba she will be switched to Fosamax weekly and has been sent to pharmacy.  Verbalized understanding.

## 2023-06-29 NOTE — Telephone Encounter (Signed)
 Pt called stating that she had an US done on 06/02/2023 by her PCP and wanted to make you aware of it incase you seen something on it that she needs to be aware of.

## 2023-06-30 NOTE — Telephone Encounter (Signed)
 Reviewed u/s results. No changes from previous u/s, known cirrhosis and gallstones.

## 2023-06-30 NOTE — Telephone Encounter (Signed)
 Pt was made aware and verbalized understanding.

## 2023-10-18 ENCOUNTER — Emergency Department (HOSPITAL_COMMUNITY)
Admission: EM | Admit: 2023-10-18 | Discharge: 2023-10-18 | Disposition: A | Discharge status: Home / Self Care | Attending: Emergency Medicine | Admitting: Emergency Medicine

## 2023-10-18 ENCOUNTER — Encounter (HOSPITAL_COMMUNITY): Payer: Self-pay

## 2023-10-18 ENCOUNTER — Emergency Department (HOSPITAL_COMMUNITY)

## 2023-10-18 ENCOUNTER — Other Ambulatory Visit: Payer: Self-pay

## 2023-10-18 DIAGNOSIS — F319 Bipolar disorder, unspecified: Secondary | ICD-10-CM | POA: Diagnosis not present

## 2023-10-18 DIAGNOSIS — Z79899 Other long term (current) drug therapy: Secondary | ICD-10-CM | POA: Diagnosis not present

## 2023-10-18 DIAGNOSIS — G20A1 Parkinson's disease without dyskinesia, without mention of fluctuations: Secondary | ICD-10-CM | POA: Diagnosis not present

## 2023-10-18 DIAGNOSIS — K7460 Unspecified cirrhosis of liver: Secondary | ICD-10-CM | POA: Diagnosis not present

## 2023-10-18 DIAGNOSIS — K8020 Calculus of gallbladder without cholecystitis without obstruction: Secondary | ICD-10-CM | POA: Insufficient documentation

## 2023-10-18 DIAGNOSIS — Z905 Acquired absence of kidney: Secondary | ICD-10-CM | POA: Insufficient documentation

## 2023-10-18 DIAGNOSIS — R1012 Left upper quadrant pain: Secondary | ICD-10-CM

## 2023-10-18 DIAGNOSIS — R109 Unspecified abdominal pain: Secondary | ICD-10-CM | POA: Diagnosis present

## 2023-10-18 DIAGNOSIS — R079 Chest pain, unspecified: Secondary | ICD-10-CM | POA: Diagnosis present

## 2023-10-18 DIAGNOSIS — R1084 Generalized abdominal pain: Secondary | ICD-10-CM | POA: Diagnosis not present

## 2023-10-18 LAB — URINALYSIS, ROUTINE W REFLEX MICROSCOPIC
Bilirubin Urine: NEGATIVE
Glucose, UA: NEGATIVE mg/dL
Hgb urine dipstick: NEGATIVE
Ketones, ur: NEGATIVE mg/dL
Leukocytes,Ua: NEGATIVE
Nitrite: NEGATIVE
Protein, ur: NEGATIVE mg/dL
Specific Gravity, Urine: 1.003 — ABNORMAL LOW (ref 1.005–1.030)
pH: 7 (ref 5.0–8.0)

## 2023-10-18 LAB — TROPONIN I (HIGH SENSITIVITY)
Troponin I (High Sensitivity): 3 ng/L (ref <18)
Troponin I (High Sensitivity): 3 ng/L (ref <18)

## 2023-10-18 LAB — COMPREHENSIVE METABOLIC PANEL WITH GFR
ALT: 11 U/L (ref 0–44)
AST: 35 U/L (ref 15–41)
Albumin: 4.4 g/dL (ref 3.5–5.0)
Alkaline Phosphatase: 47 U/L (ref 38–126)
Anion gap: 13 (ref 5–15)
BUN: 19 mg/dL (ref 8–23)
CO2: 24 mmol/L (ref 22–32)
Calcium: 9.2 mg/dL (ref 8.9–10.3)
Chloride: 104 mmol/L (ref 98–111)
Creatinine, Ser: 0.82 mg/dL (ref 0.44–1.00)
GFR, Estimated: >60 mL/min (ref >60)
Glucose, Bld: 110 mg/dL — ABNORMAL HIGH (ref 70–99)
Potassium: 4.5 mmol/L (ref 3.5–5.1)
Sodium: 141 mmol/L (ref 135–145)
Total Bilirubin: 0.9 mg/dL (ref 0.0–1.2)
Total Protein: 6.9 g/dL (ref 6.5–8.1)

## 2023-10-18 LAB — CBC
HCT: 41.7 % (ref 36.0–46.0)
Hemoglobin: 14.5 g/dL (ref 12.0–15.0)
MCH: 31.2 pg (ref 26.0–34.0)
MCHC: 34.8 g/dL (ref 30.0–36.0)
MCV: 89.7 fL (ref 80.0–100.0)
Platelets: 136 10*3/uL — ABNORMAL LOW (ref 150–400)
RBC: 4.65 MIL/uL (ref 3.87–5.11)
RDW: 12.2 % (ref 11.5–15.5)
WBC: 6.3 10*3/uL (ref 4.0–10.5)
nRBC: 0 % (ref 0.0–0.2)

## 2023-10-18 LAB — BRAIN NATRIURETIC PEPTIDE: B Natriuretic Peptide: 17 pg/mL (ref 0.0–100.0)

## 2023-10-18 LAB — LIPASE, BLOOD: Lipase: 41 U/L (ref 11–51)

## 2023-10-18 MED ORDER — PANTOPRAZOLE SODIUM 40 MG IV SOLR
40.0000 mg | Freq: Once | INTRAVENOUS | Status: AC
  Filled 2023-10-18: qty 10

## 2023-10-18 MED ORDER — ONDANSETRON HCL 4 MG/2ML IJ SOLN
4.0000 mg | Freq: Once | INTRAMUSCULAR | Status: AC
  Filled 2023-10-18: qty 2

## 2023-10-18 MED ORDER — FENTANYL CITRATE PF 50 MCG/ML IJ SOSY
50.0000 ug | PREFILLED_SYRINGE | Freq: Once | INTRAMUSCULAR | Status: AC
  Administered 2023-10-18: 50 ug via INTRAVENOUS
  Filled 2023-10-18: qty 1

## 2023-10-18 MED ORDER — IOHEXOL 300 MG/ML  SOLN: 100.0000 mL | Freq: Once | INTRAMUSCULAR | Status: AC | PRN

## 2023-10-18 MED ORDER — ALUM & MAG HYDROXIDE-SIMETH 200-200-20 MG/5ML PO SUSP
30.0000 mL | Freq: Once | ORAL | Status: AC
  Filled 2023-10-18: qty 30

## 2023-10-18 NOTE — ED Notes (Signed)
 X-ray at bedside

## 2023-10-18 NOTE — ED Provider Notes (Signed)
 [facility] AT [facility] Provider Note   CSN: [id number] Arrival date & time: 10/18/23  1547     Patient presents with: Abdominal Pain   Laurie Moreno is a 73 y.o. female.  He has PMH of Parkinson disease, cirrhosis, bipolar disorder, history of left nephrectomy, GERD, thrombocytopenia.  She started having epigastric and left lower chest pain around 1 PM today, she tried to lay down and did not get any relief.  She also noticed her abdomen was distended.  Her family was not able to drive her to the ER so she asked them to call EMS for further evaluation.  She denies vomiting but has had nausea, no shortness of breath, no fevers or chills, no lower extremity or swelling or pain.  Denies diarrhea or constipation.    Abdominal Pain      Prior to Admission medications   Medication Sig Start Date End Date Taking? Authorizing Provider  acetaminophen  (TYLENOL ) 500 MG tablet Take by mouth.    [provider]  albuterol  (VENTOLIN  HFA) 108 (90 Base) MCG/ACT inhaler Inhale 2 puffs into the lungs as needed for wheezing or shortness of breath.    [provider]  alendronate  (FOSAMAX ) 70 MG tablet Take 1 tablet (70 mg total) by mouth once a week. Take with a full glass of water on an empty stomach. 06/29/23   Paulett Boros, MD  anastrozole  (ARIMIDEX ) 1 MG tablet TAKE 1 TABLET BY MOUTH EVERY DAY 01/25/23   Paulett Boros, MD  atorvastatin  (LIPITOR) 40 MG tablet Take 40 mg by mouth daily.     [provider]  Calcium  Carbonate-Vit D-Min (CALCIUM  600+D3 PLUS MINERALS) 600-800 MG-UNIT CHEW Chew 1 tablet by mouth 2 (two) times daily.    [provider]  carbidopa -levodopa  (SINEMET  IR) 25-100 MG tablet Take 1.5 tablets by mouth 3 (three) times daily. 10/04/18 12/25/22  [provider]  Cholecalciferol  (VITAMIN D ) 50 MCG (2000 UT) tablet Take 2,000 Units by mouth daily.    [provider]  clonazePAM  (KLONOPIN )  0.5 MG tablet Take 1 tablet (0.5 mg total) by mouth 2 (two) times daily. Patient taking differently: Take 0.5 mg by mouth 2 (two) times daily as needed. 09/17/17   Lassen, Arlo C, PA-C  divalproex  (DEPAKOTE ) 250 MG DR tablet Take 250 mg by mouth 3 (three) times daily. Do not crush    [provider]  escitalopram  (LEXAPRO ) 20 MG tablet Take 20 mg by mouth daily. 12/20/18   [provider]  fluticasone (FLONASE) 50 MCG/ACT nasal spray Place 2 sprays into both nostrils daily.    [provider]  gabapentin  (NEURONTIN ) 300 MG capsule Take 300 mg by mouth at bedtime.    [provider]  hydrOXYzine  (ATARAX /VISTARIL ) 25 MG tablet Take 25 mg by mouth every 8 (eight) hours as needed for itching.     [provider]  levothyroxine  (SYNTHROID , LEVOTHROID) 125 MCG tablet Take 125 mcg by mouth daily before breakfast.    [provider]  lisinopril (ZESTRIL) 2.5 MG tablet Take 2.5 mg by mouth daily. 07/02/20   [provider]  metoprolol tartrate (LOPRESSOR) 25 MG tablet Take 25 mg by mouth 2 (two) times daily. 04/15/22   [provider]  Multiple Vitamin (MULTIVITAMIN) tablet Take 1 tablet by mouth every morning.     [provider]  oxybutynin  (DITROPAN -XL) 5 MG 24 hr tablet Take 5 mg by mouth daily. 04/22/21   [provider]  pantoprazole  (PROTONIX )  40 MG tablet TAKE 1 TABLET BY MOUTH EVERY DAY BEFORE BREAKFAST Patient taking differently: Take 40 mg by mouth daily. 02/27/21   Lanney Pitts, PA-C  primidone  (MYSOLINE ) 50 MG tablet Take 100 mg by mouth 2 (two) times daily.     [provider]  SPIRIVA HANDIHALER 18 MCG inhalation capsule 1 capsule daily. 09/25/21   [provider]  SYMBICORT 160-4.5 MCG/ACT inhaler SMARTSIG:2 Puff(s) By Mouth Daily PRN 10/03/21   [provider]    Allergies: Codeine and Penicillins    Review of Systems  Gastrointestinal:  Positive for abdominal pain.     Updated Vital Signs BP 111/60   Pulse 63   Temp 98.9 F (37.2 C) (Oral)   Resp 17   Ht 5' 4 (1.626 m)   Wt 70.1 kg   SpO2 91%   BMI 26.53 kg/m   Physical Exam Vitals and nursing note reviewed.  Constitutional:      General: She is not in acute distress.    Appearance: She is well-developed.  HENT:     Head: Normocephalic and atraumatic.   Eyes:     Conjunctiva/sclera: Conjunctivae normal.    Cardiovascular:     Rate and Rhythm: Normal rate and regular rhythm.     Heart sounds: No murmur heard. Pulmonary:     Effort: Pulmonary effort is normal. No respiratory distress.     Breath sounds: Normal breath sounds.  Abdominal:     General: There is distension.     Palpations: Abdomen is soft.     Tenderness: There is abdominal tenderness in the epigastric area and left upper quadrant.   Musculoskeletal:        General: No swelling.     Cervical back: Neck supple.   Skin:    General: Skin is warm and dry.     Capillary Refill: Capillary refill takes less than 2 seconds.   Neurological:     Mental Status: She is alert.   Psychiatric:        Mood and Affect: Mood normal.     (all labs ordered are listed, but only abnormal results are displayed) Labs Reviewed  COMPREHENSIVE METABOLIC PANEL WITH GFR - Abnormal; Notable for the following components:      Result Value   Glucose, Bld 110 (*)    All other components within normal limits  CBC - Abnormal; Notable for the following components:   Platelets 136 (*)    All other components within normal limits  URINALYSIS, ROUTINE W REFLEX MICROSCOPIC - Abnormal; Notable for the following components:   Color, Urine STRAW (*)    Specific Gravity, Urine 1.003 (*)    All other components within normal limits  LIPASE, BLOOD  BRAIN NATRIURETIC PEPTIDE  TROPONIN I (HIGH SENSITIVITY)  TROPONIN I (HIGH SENSITIVITY)    EKG: EKG Interpretation Date/Time:  Monday October 18 2023 19:14:37 EDT Ventricular Rate:  60 PR  Interval:  183 QRS Duration:  108 QT Interval:  440 QTC Calculation: 440 R Axis:   -78  Text Interpretation: Sinus rhythm Left anterior fascicular block Nonspecific T abnormalities, inferior leads Confirmed by Iva Mariner [phone]) on 10/18/2023 7:30:19 PM  Radiology: CT ABDOMEN PELVIS W CONTRAST Result Date: 10/18/2023 CLINICAL DATA:  Abdominal pain. EXAM: CT ABDOMEN AND PELVIS WITH CONTRAST TECHNIQUE: Multidetector CT imaging of the abdomen and pelvis was performed using the standard protocol following bolus administration of intravenous contrast. RADIATION DOSE REDUCTION: This exam was performed according to the departmental dose-optimization program  which includes automated exposure control, adjustment of the mA and/or kV according to patient size and/or use of iterative reconstruction technique. CONTRAST:  OMNIPAQUE  IOHEXOL  300 MG/ML  SOLN COMPARISON:  08/21/2017. FINDINGS: Lower chest: No acute abnormality. No pleural or pericardial effusions. Hepatobiliary: Nodular configuration suggesting cirrhosis. No focal hepatic lesions. No biliary ductal dilatation. Numerous small gallstones. No pericholecystic inflammatory changes. Pancreas: Unremarkable. No pancreatic ductal dilatation or surrounding inflammatory changes. Spleen: Enlarged, 14 cm.  No focal lesions. Adrenals/Urinary Tract: Status post left nephrectomy. Right kidney several cysts a few right kidney cysts identified measuring up to 1.9 cm. No nephrolithiasis or hydronephrosis. Stomach/Bowel: Stomach is within normal limits. Appendix appears normal. No evidence of bowel wall thickening, distention, or inflammatory changes. Vascular/Lymphatic: No significant vascular findings are present. No enlarged abdominal or pelvic lymph nodes. Reproductive: Uterine nodularity suggesting fibroids. No masses or fluid collections. Other: No abdominal wall hernia or abnormality. No abdominopelvic ascites. Musculoskeletal: Thoracolumbosacral degenerative  changes. IMPRESSION: 1. Cirrhotic appearing liver. Splenomegaly. 2. Cholelithiasis. 3. Right kidney cysts. 4. Uterine fibroids. Electronically Signed   By: Sydell Eva M.D.   On: 10/18/2023 20:08   DG Chest Portable 1 View Result Date: 10/18/2023 CLINICAL DATA:  chest pain EXAM: PORTABLE CHEST 1 VIEW COMPARISON:  Chest x-ray 03/24/2018, CT chest 02/16/2023 FINDINGS: The heart and mediastinal contours are within normal limits. No focal consolidation. No pulmonary edema. No pleural effusion. No pneumothorax. No acute osseous abnormality.  Left chest wall vascular clips. IMPRESSION: No active disease. Electronically Signed   By: Morgane  Naveau M.D.   On: 10/18/2023 19:35     Procedures   Medications Ordered in the ED  ondansetron  (ZOFRAN ) injection 4 mg (4 mg Intravenous Given 10/18/23 1935)  fentaNYL  (SUBLIMAZE ) injection 50 mcg (50 mcg Intravenous Given 10/18/23 1939)  iohexol  (OMNIPAQUE ) 300 MG/ML solution 100 mL (100 mLs Intravenous Contrast Given 10/18/23 1950)  pantoprazole  (PROTONIX ) injection 40 mg (40 mg Intravenous Given 10/18/23 2057)  alum & mag hydroxide-simeth (MAALOX/MYLANTA) 200-200-20 MG/5ML suspension 30 mL (30 mLs Oral Given 10/18/23 2058)                                    Medical Decision Making This patient presents to the ED for concern of abdominal pain with nausea and distention, chest pain after eating, this involves an extensive number of treatment options, and is a complaint that carries with it a high risk of complications and morbidity.  The differential diagnosis includes ACS, PE, pneumonia, gastritis, PUD, gastroenteritis, pancreatitis, gastroparesis, bowel obstruction, other   Co morbidities that complicate the patient evaluation :   Parkinson disease, cirrhosis, gallstones, bipolar disorder, left nephrectomy   Additional history obtained:  Additional history obtained from EMR External records from outside source obtained and reviewed including prior notes  and labs   Lab Tests:  I Ordered, and personally interpreted labs.  The pertinent results include: CBC, CMP, lipase, BNP and UA are all normal.  Troponin is negative   Imaging Studies ordered:  I ordered imaging studies including chest x-ray which shows pulmonary edema or infiltrate; CT abdomen pelvis shows no acute findings, findings consistent with cirrhosis and gallstones  I independently visualized and interpreted imaging within scope of identifying emergent findings  I agree with the radiologist interpretation   Cardiac Monitoring: / EKG:  The patient was maintained on a cardiac monitor.  I personally viewed and interpreted the cardiac monitored which showed an  underlying rhythm of: sinus rhythm nonspecific T wave abnormalities in inferior leads    Problem List / ED Course / Critical interventions / Medication management  abdominal pain-patient started having left lower chest and left upper and epigastric abdominal pain after eating a roast beef sandwich at lunch today.  Down but was not able to get relief, comes into the ER for further evaluation which is reassuring today.  2 negative troponins, EKG reassuring, pain is primarily in the abdomen with reproducible tenderness to this area.  She felt like she was having bloating.  She is feeling better after Protonix , GI cocktail, also got fentanyl  as original medication without significant relief, the GI cocktail Protonix  have proved her symptoms significantly.  Is already on Protonix  at home and advised to continue taking this and follow-up with her PCP.  She has known gallstones and known cirrhosis that was informed of her incidental findings on CT.  Reevaluation of the patient after these medicines showed that the patient improved I have reviewed the patients home medicines and have made adjustments as needed   Social Determinants of Health:  Patient lives at home     Amount and/or Complexity of Data Reviewed Labs:  ordered. Radiology: ordered.  Risk OTC drugs. Prescription drug management.        Final diagnoses:  Generalized abdominal pain  Left upper quadrant abdominal pain    ED Discharge Orders     None          Joshua Nieves 10/18/23 2356    Iva Mariner, MD 10/19/23 [phone]

## 2023-10-18 NOTE — Discharge Instructions (Addendum)
 Your test results today are reassuring.  Continue your daily Protonix .  Take over-the-counter Maalox or Mylanta as needed for upper abdominal or chest discomfort.  Return to the emergency department for any persistent, new, or worsening symptoms.

## 2023-10-18 NOTE — ED Triage Notes (Signed)
 Pt arrived via [facility] for complaints of upper abdominal pain and chest pain. EMS establish 20g IV access in Pts right AC PTA. Pt reports pain not relieved by anything. Pt reports she took 4 baby aspirin . Pt denies N/V/D.

## 2023-10-18 NOTE — ED Notes (Signed)
 Patient transported to CT

## 2023-12-21 ENCOUNTER — Other Ambulatory Visit: Payer: Self-pay | Admitting: *Deleted

## 2023-12-21 DIAGNOSIS — Z170 Estrogen receptor positive status [ER+]: Secondary | ICD-10-CM

## 2023-12-21 DIAGNOSIS — Z1231 Encounter for screening mammogram for malignant neoplasm of breast: Secondary | ICD-10-CM

## 2023-12-22 ENCOUNTER — Inpatient Hospital Stay: Payer: 59 | Attending: Oncology

## 2023-12-22 ENCOUNTER — Ambulatory Visit (HOSPITAL_COMMUNITY)
Admission: RE | Admit: 2023-12-22 | Discharge: 2023-12-22 | Disposition: A | Discharge status: Home / Self Care | Payer: 59 | Source: Ambulatory Visit | Attending: Hematology | Admitting: Hematology

## 2023-12-22 DIAGNOSIS — Z170 Estrogen receptor positive status [ER+]: Secondary | ICD-10-CM | POA: Diagnosis not present

## 2023-12-22 DIAGNOSIS — M85852 Other specified disorders of bone density and structure, left thigh: Secondary | ICD-10-CM | POA: Diagnosis not present

## 2023-12-22 DIAGNOSIS — Z79811 Long term (current) use of aromatase inhibitors: Secondary | ICD-10-CM | POA: Insufficient documentation

## 2023-12-22 DIAGNOSIS — C50212 Malignant neoplasm of upper-inner quadrant of left female breast: Secondary | ICD-10-CM | POA: Insufficient documentation

## 2023-12-22 DIAGNOSIS — Z9012 Acquired absence of left breast and nipple: Secondary | ICD-10-CM | POA: Diagnosis not present

## 2023-12-22 DIAGNOSIS — Z1231 Encounter for screening mammogram for malignant neoplasm of breast: Secondary | ICD-10-CM | POA: Insufficient documentation

## 2023-12-22 DIAGNOSIS — R161 Splenomegaly, not elsewhere classified: Secondary | ICD-10-CM | POA: Diagnosis not present

## 2023-12-22 DIAGNOSIS — K7460 Unspecified cirrhosis of liver: Secondary | ICD-10-CM | POA: Diagnosis not present

## 2023-12-22 DIAGNOSIS — D696 Thrombocytopenia, unspecified: Secondary | ICD-10-CM | POA: Insufficient documentation

## 2023-12-22 DIAGNOSIS — Z17411 Hormone receptor positive with human epidermal growth factor receptor 2 negative status: Secondary | ICD-10-CM | POA: Diagnosis not present

## 2023-12-22 LAB — COMPREHENSIVE METABOLIC PANEL WITH GFR
ALT: 47 U/L — ABNORMAL HIGH (ref 0–44)
AST: 35 U/L (ref 15–41)
Albumin: 4.1 g/dL (ref 3.5–5.0)
Alkaline Phosphatase: 53 U/L (ref 38–126)
Anion gap: 12 (ref 5–15)
BUN: 18 mg/dL (ref 8–23)
CO2: 23 mmol/L (ref 22–32)
Calcium: 8.9 mg/dL (ref 8.9–10.3)
Chloride: 103 mmol/L (ref 98–111)
Creatinine, Ser: 0.82 mg/dL (ref 0.44–1.00)
GFR, Estimated: >60 mL/min (ref >60)
Glucose, Bld: 228 mg/dL — ABNORMAL HIGH (ref 70–99)
Potassium: 4 mmol/L (ref 3.5–5.1)
Sodium: 138 mmol/L (ref 135–145)
Total Bilirubin: 0.9 mg/dL (ref 0.0–1.2)
Total Protein: 6.9 g/dL (ref 6.5–8.1)

## 2023-12-22 LAB — CBC WITH DIFFERENTIAL/PLATELET
Abs Immature Granulocytes: 0.02 K/uL (ref 0.00–0.07)
Basophils Absolute: 0 K/uL (ref 0.0–0.1)
Basophils Relative: 1 %
Eosinophils Absolute: 0 K/uL (ref 0.0–0.5)
Eosinophils Relative: 0 %
HCT: 41.4 % (ref 36.0–46.0)
Hemoglobin: 14.2 g/dL (ref 12.0–15.0)
Immature Granulocytes: 0 %
Lymphocytes Relative: 26 %
Lymphs Abs: 1.4 K/uL (ref 0.7–4.0)
MCH: 30.7 pg (ref 26.0–34.0)
MCHC: 34.3 g/dL (ref 30.0–36.0)
MCV: 89.4 fL (ref 80.0–100.0)
Monocytes Absolute: 0.5 K/uL (ref 0.1–1.0)
Monocytes Relative: 9 %
Neutro Abs: 3.5 K/uL (ref 1.7–7.7)
Neutrophils Relative %: 64 %
Platelets: 135 K/uL — ABNORMAL LOW (ref 150–400)
RBC: 4.63 MIL/uL (ref 3.87–5.11)
RDW: 12.1 % (ref 11.5–15.5)
WBC: 5.5 K/uL (ref 4.0–10.5)
nRBC: 0 % (ref 0.0–0.2)

## 2023-12-22 LAB — VITAMIN D 25 HYDROXY (VIT D DEFICIENCY, FRACTURES): Vit D, 25-Hydroxy: 44.08 ng/mL (ref 30–100)

## 2023-12-29 ENCOUNTER — Inpatient Hospital Stay (HOSPITAL_BASED_OUTPATIENT_CLINIC_OR_DEPARTMENT_OTHER): Payer: 59 | Admitting: Oncology

## 2023-12-29 ENCOUNTER — Ambulatory Visit: Payer: 59

## 2023-12-29 VITALS — BP 123/75 | HR 79 | Temp 98.2°F | Resp 18 | Wt 152.6 lb

## 2023-12-29 DIAGNOSIS — M85852 Other specified disorders of bone density and structure, left thigh: Secondary | ICD-10-CM

## 2023-12-29 DIAGNOSIS — C50212 Malignant neoplasm of upper-inner quadrant of left female breast: Secondary | ICD-10-CM | POA: Diagnosis not present

## 2023-12-29 DIAGNOSIS — Z170 Estrogen receptor positive status [ER+]: Secondary | ICD-10-CM | POA: Diagnosis not present

## 2023-12-29 NOTE — Assessment & Plan Note (Addendum)
-   She is tolerating anastrozole  very well.  - Physical exam: Left mastectomy site is within normal limits.  No palpable adenopathy.  No palpable right breast mass. - Labs from 12/22/2023: Normal LFTs.  CBC grossly normal.  Mild thrombocytopenia stable. -Right breast mammogram on 12/22/2023 was read as BI-RADS Category 1 negative. -She will complete 5 years of anastrozole  in March 2026.  We discussed BCI testing.  Will send this off and get results back at her next visit in 6 months.  For now, continue anastrozole . -We will schedule her for mammogram in August next year.  RTC 1 year for follow-up.

## 2023-12-29 NOTE — Assessment & Plan Note (Addendum)
-   Calcium  is 8.9.   -She appears to be tolerating Fosamax  once per week well.  Previously on Prolia  but this was expensive. -She continues calcium  and vitamin D . -Her most recent bone scan was from 04/30/2021 which showed a T-score of -2.4.  We will repeat this in the next few weeks. -Will call with results.

## 2023-12-29 NOTE — Progress Notes (Signed)
 [facility] OFFICE PROGRESS NOTE  Rogers Hai, MD (Inactive)  ASSESSMENT & PLAN:    Assessment & Plan Malignant neoplasm of upper-inner quadrant of left breast in female, estrogen receptor positive (HCC) - She is tolerating anastrozole  very well.  - Physical exam: Left mastectomy site is within normal limits.  No palpable adenopathy.  No palpable right breast mass. - Labs from 12/22/2023: Normal LFTs.  CBC grossly normal.  Mild thrombocytopenia stable. -Right breast mammogram on 12/22/2023 was read as BI-RADS Category 1 negative. -She will complete 5 years of anastrozole  in March 2026.  We discussed BCI testing.  Will send this off and get results back at her next visit in 6 months.  For now, continue anastrozole . -We will schedule her for mammogram in August next year.  RTC 1 year for follow-up. Osteopenia of neck of left femur - Calcium  is 8.9.   -She appears to be tolerating Fosamax  once per week well.  Previously on Prolia  but this was expensive. -She continues calcium  and vitamin D . -Her most recent bone scan was from 04/30/2021 which showed a T-score of -2.4.  We will repeat this in the next few weeks. -Will call with results.   Orders Placed This Encounter  Procedures   DG Bone Density    Standing Status:   Future    Expected Date:   01/05/2024    Expiration Date:   12/28/2024    Reason for Exam (SYMPTOM  OR DIAGNOSIS REQUIRED):   osteopenia- on AI    Preferred imaging location?:   [facility]   CBC with Differential/Platelet    Standing Status:   Future    Expected Date:   06/30/2024    Expiration Date:   12/28/2024    Release to patient:   Immediate   Comprehensive metabolic panel    Standing Status:   Future    Expected Date:   06/30/2024    Expiration Date:   12/28/2024    Release to patient:   Immediate   VITAMIN D  25 Hydroxy (Vit-D Deficiency, Fractures)    Standing Status:   Future    Expected Date:   06/30/2024    Expiration Date:    12/28/2024    Release to patient:   Immediate    INTERVAL HISTORY: Patient returns for follow-up for breast cancer and osteopenia.  Patient stopped taking Prolia  due to expense and switch to Fosamax  for her bone health.  She appears to be tolerating well.  She takes this once per week.  She recently had a mammogram on 12/22/2023 which showed BI-RADS Category 1 negative.  Repeat annually.  She continues anastrozole  and tolerates well.  She will complete 5 years of treatment in March 2026.  She was evaluated in the ED on 10/18/2023 for abdominal pain with nausea and distention.  She received GI cocktail Protonix  and fentanyl  with relief.  She is followed by gastroenterology for cirrhosis and recently saw them on 10/18/2023.  She recently had an CT abdomen pelvis which showed cirrhotic appearing liver.  Splenomegaly.  Cholelithiasis and a right kidney cyst.  We reviewed CBC, CMP and vitamin D .  SUMMARY OF HEMATOLOGIC HISTORY: Oncology History  Breast cancer (HCC)  11/28/2018 Initial Diagnosis   Breast cancer (HCC)   02/23/2019 Cancer Staging   Staging form: Breast, AJCC 8th Edition - Clinical stage from 02/23/2019: Stage IA (cT1b, cN0(sn), cM0, G2, ER+, PR+, HER2-) - Signed by Rogers Hai, MD on 02/23/2019      CBC  Component Value Date/Time   WBC 5.5 12/22/2023 0958   RBC 4.63 12/22/2023 0958   HGB 14.2 12/22/2023 0958   HCT 41.4 12/22/2023 0958   PLT 135 (L) 12/22/2023 0958   MCV 89.4 12/22/2023 0958   MCH 30.7 12/22/2023 0958   MCHC 34.3 12/22/2023 0958   RDW 12.1 12/22/2023 0958   LYMPHSABS 1.4 12/22/2023 0958   MONOABS 0.5 12/22/2023 0958   EOSABS 0.0 12/22/2023 0958   BASOSABS 0.0 12/22/2023 0958       Latest Ref Rng & Units 12/22/2023    9:58 AM 10/18/2023    5:56 PM 02/25/2023    9:30 AM  CMP  Glucose 70 - 99 mg/dL 771  889  873   BUN 8 - 23 mg/dL 18  19  18    Creatinine 0.44 - 1.00 mg/dL 9.17  9.17  9.27   Sodium 135 - 145 mmol/L 138  141  138    Potassium 3.5 - 5.1 mmol/L 4.0  4.5  4.6   Chloride 98 - 111 mmol/L 103  104  103   CO2 22 - 32 mmol/L 23  24  26    Calcium  8.9 - 10.3 mg/dL 8.9  9.2  8.3   Total Protein 6.5 - 8.1 g/dL 6.9  6.9  6.6   Total Bilirubin 0.0 - 1.2 mg/dL 0.9  0.9  0.6   Alkaline Phos 38 - 126 U/L 53  47  33   AST 15 - 41 U/L 35  35  20   ALT 0 - 44 U/L 47  11  23      Lab Results  Component Value Date   FERRITIN 107 12/23/2018   VITAMINB12 671 09/06/2017    Vitals:   12/29/23 1417  BP: 123/75  Pulse: 79  Resp: 18  Temp: 98.2 F (36.8 C)  SpO2: 94%    Review of System:  Review of Systems  Constitutional:  Positive for malaise/fatigue.  Gastrointestinal:  Positive for constipation.  Neurological:  Positive for weakness.  Psychiatric/Behavioral:  The patient is nervous/anxious.     Physical Exam: Physical Exam Constitutional:      Appearance: Normal appearance. She is obese.  HENT:     Head: Normocephalic and atraumatic.  Eyes:     Pupils: Pupils are equal, round, and reactive to light.  Cardiovascular:     Rate and Rhythm: Normal rate and regular rhythm.     Heart sounds: Normal heart sounds. No murmur heard. Pulmonary:     Effort: Pulmonary effort is normal.     Breath sounds: Normal breath sounds. No wheezing.  Chest:  Breasts:    Left: Absent.     Comments: No lymphadenopathy.  No unusual lumps or bumps. Abdominal:     General: Bowel sounds are normal. There is no distension.     Palpations: Abdomen is soft.     Tenderness: There is no abdominal tenderness.  Musculoskeletal:        General: Normal range of motion.     Cervical back: Normal range of motion.  Skin:    General: Skin is warm and dry.     Findings: No rash.  Neurological:     Mental Status: She is alert and oriented to person, place, and time.     Gait: Gait is intact.  Psychiatric:        Mood and Affect: Mood and affect normal.        Cognition and Memory: Memory normal.  Judgment: Judgment  normal.      I spent 20 minutes dedicated to the care of this patient (face-to-face and non-face-to-face) on the date of the encounter to include what is described in the assessment and plan.,  Delon Hope, NP 12/29/2023 2:42 PM

## 2024-01-12 ENCOUNTER — Other Ambulatory Visit (HOSPITAL_COMMUNITY)

## 2024-01-17 ENCOUNTER — Encounter (HOSPITAL_COMMUNITY): Payer: Self-pay | Admitting: Oncology

## 2024-01-24 ENCOUNTER — Ambulatory Visit: Admitting: Gastroenterology

## 2024-01-28 ENCOUNTER — Ambulatory Visit: Admitting: Gastroenterology

## 2024-01-28 ENCOUNTER — Other Ambulatory Visit (HOSPITAL_COMMUNITY): Payer: Self-pay | Admitting: Family Medicine

## 2024-01-28 DIAGNOSIS — Z87891 Personal history of nicotine dependence: Secondary | ICD-10-CM

## 2024-01-31 ENCOUNTER — Other Ambulatory Visit: Payer: Self-pay | Admitting: *Deleted

## 2024-01-31 DIAGNOSIS — Z170 Estrogen receptor positive status [ER+]: Secondary | ICD-10-CM

## 2024-01-31 MED ORDER — ANASTROZOLE 1 MG PO TABS: 1.0000 mg | ORAL_TABLET | Freq: Every day | ORAL | 2 refills | Status: AC

## 2024-02-14 ENCOUNTER — Ambulatory Visit (HOSPITAL_COMMUNITY)
Admission: RE | Admit: 2024-02-14 | Discharge: 2024-02-14 | Disposition: A | Discharge status: Home / Self Care | Source: Ambulatory Visit | Attending: Family Medicine | Admitting: Family Medicine

## 2024-02-14 ENCOUNTER — Ambulatory Visit (HOSPITAL_COMMUNITY)
Admission: RE | Admit: 2024-02-14 | Discharge: 2024-02-14 | Disposition: A | Discharge status: Home / Self Care | Source: Ambulatory Visit | Attending: Oncology | Admitting: Oncology

## 2024-02-14 DIAGNOSIS — Z87891 Personal history of nicotine dependence: Secondary | ICD-10-CM | POA: Diagnosis present

## 2024-02-14 DIAGNOSIS — M85852 Other specified disorders of bone density and structure, left thigh: Secondary | ICD-10-CM | POA: Diagnosis present

## 2024-03-08 ENCOUNTER — Ambulatory Visit: Admitting: Gastroenterology

## 2024-03-08 ENCOUNTER — Telehealth: Payer: Self-pay | Admitting: *Deleted

## 2024-03-08 ENCOUNTER — Encounter: Payer: Self-pay | Admitting: Gastroenterology

## 2024-03-08 VITALS — BP 120/81 | HR 68 | Temp 98.1°F | Ht 64.0 in | Wt 150.6 lb

## 2024-03-08 DIAGNOSIS — K5900 Constipation, unspecified: Secondary | ICD-10-CM | POA: Insufficient documentation

## 2024-03-08 DIAGNOSIS — K219 Gastro-esophageal reflux disease without esophagitis: Secondary | ICD-10-CM

## 2024-03-08 DIAGNOSIS — K7460 Unspecified cirrhosis of liver: Secondary | ICD-10-CM | POA: Diagnosis not present

## 2024-03-08 NOTE — Progress Notes (Signed)
 GI Office Note    Referring Provider: No ref. provider found Primary Care Physician:  Halbert Mariano SQUIBB, DO   Primary Gastroenterologist: Carlin POUR. Cindie, DO   Chief Complaint   Chief Complaint  Patient presents with   Follow-up    Having to use miralax  if she doesn't have a bm after 2 days.     History of Present Illness   Laurie Moreno is a 73 y.o. female presenting today for follow up. Last seen 12/2022. H/O MASH cirrhosis, GERD, constipation.   She had an EGD and colonoscopy in June 2021.  She had mild portal hypertensive gastropathy, linear gastric erosions.  No varices.  Biopsy showed reactive gastropathy but no H. pylori.  Plans for repeat EGD in 2 years to screen for varices. On colonoscopy she had 1.5 cm verrucous, raised nodule upper gluteal fold, scattered diverticula, 3 sessile tubular adenomas removed. Plans for next colonoscopy in 5 years.  She was advised to see dermatology for the nodule noted near the upper end of the gluteal folds.   Last MELD 3.0 on file was 8 in 02/2023.  She is immune to Hepatitis A.  Discussed the use of AI scribe software for clinical note transcription with the patient, who gave verbal consent to proceed.   She has a history of cirrhosis and typically attends follow-up appointments every six months for monitoring. However, she has not been seen in over a year. No recent episodes of lethargy, confusion, or behavioral changes have been reported by the patient or her family. Her family has not noticed any such changes either.  She experiences occasional constipation, which she manages with Miralax . She does not take it daily but uses it as needed, typically mixing a capful with orange juice if she has not had a bowel movement in a day. This usually results in a bowel movement before dinner on the same day. She tries to maintain regular bowel movements. No melena, brbpr.  In June, she experienced stomach pain and a rapid heartbeat,  leading to an ER visit. She was given aspirin  and underwent a CT A/P and chest X-ray, which were reported as normal except for known liver issues. She was treated with a GI cocktail and pain medication while in the ER. No recurrent abdominal pain.  She is on pantoprazole  for heartburn, which she takes before breakfast. Her heartburn is controlled with this medication. No dysphagia.   No lower extremity edema or abdominal distention. Appetite remains good.   She has a small nodule on her left upper arm (inner aspect). She says she showed it to her cardiologist and wants me to look at it also.       Prior Data   CT A/P with contrast 10/2023: IMPRESSION: 1. Cirrhotic appearing liver. Splenomegaly. 2. Cholelithiasis. 3. Right kidney cysts. 4. Uterine fibroids.  12/2023:  WBC 5.5, Hgb 14.2, Plt 135 Na 138, K 4, Cre 0.82, Alb 4.1 Tbili 0.9, AP 53, AST 35, ALT 47H   Medications   Current Outpatient Medications  Medication Sig Dispense Refill   acetaminophen  (TYLENOL ) 500 MG tablet Take by mouth.     albuterol  (VENTOLIN  HFA) 108 (90 Base) MCG/ACT inhaler Inhale 2 puffs into the lungs as needed for wheezing or shortness of breath.     alendronate  (FOSAMAX ) 70 MG tablet Take 1 tablet (70 mg total) by mouth once a week. Take with a full glass of water on an empty stomach. 52 tablet 0   anastrozole  (  ARIMIDEX ) 1 MG tablet Take 1 tablet (1 mg total) by mouth daily. 90 tablet 2   atorvastatin  (LIPITOR) 80 MG tablet Take 80 mg by mouth daily.     busPIRone (BUSPAR) 10 MG tablet Take 10 mg by mouth 3 (three) times daily.     Calcium  Carbonate-Vit D-Min (CALCIUM  600+D3 PLUS MINERALS) 600-800 MG-UNIT CHEW Chew 1 tablet by mouth 2 (two) times daily.     carbidopa -levodopa  (SINEMET  IR) 25-100 MG tablet Take 1.5 tablets by mouth 3 (three) times daily.     Cholecalciferol  (VITAMIN D ) 50 MCG (2000 UT) tablet Take 2,000 Units by mouth daily.     clonazePAM  (KLONOPIN ) 0.5 MG tablet Take 1 tablet (0.5 mg  total) by mouth 2 (two) times daily. 60 tablet 0   escitalopram  (LEXAPRO ) 20 MG tablet Take 20 mg by mouth daily.     famotidine  (PEPCID ) 20 MG tablet 1 tablet daily Orally Once a day; Duration: 30 days     fluticasone (FLONASE) 50 MCG/ACT nasal spray Place 2 sprays into both nostrils daily.     gabapentin  (NEURONTIN ) 300 MG capsule Take 300 mg by mouth at bedtime.     hydrOXYzine  (ATARAX /VISTARIL ) 25 MG tablet Take 25 mg by mouth every 8 (eight) hours as needed for itching.      levothyroxine  (SYNTHROID , LEVOTHROID) 125 MCG tablet Take 125 mcg by mouth daily before breakfast.     lisinopril (ZESTRIL) 5 MG tablet Take 5 mg by mouth daily.     melatonin 1 MG TABS tablet 2 tablet at bedtime as needed Orally Once a day     metoprolol tartrate (LOPRESSOR) 25 MG tablet Take 25 mg by mouth 2 (two) times daily.     Multiple Vitamin (MULTIVITAMIN) tablet Take 1 tablet by mouth every morning.      oxybutynin  (DITROPAN -XL) 5 MG 24 hr tablet Take 5 mg by mouth daily.     pantoprazole  (PROTONIX ) 40 MG tablet TAKE 1 TABLET BY MOUTH EVERY DAY BEFORE BREAKFAST 90 tablet 3   primidone  (MYSOLINE ) 50 MG tablet Take 100 mg by mouth 2 (two) times daily.      QUEtiapine (SEROQUEL) 25 MG tablet 1 tablet in the morning, 2 tablets at bedtime Orally; Duration: 90 days     SPIRIVA HANDIHALER 18 MCG inhalation capsule 1 capsule daily.     SYMBICORT 160-4.5 MCG/ACT inhaler SMARTSIG:2 Puff(s) By Mouth Daily PRN     No current facility-administered medications for this visit.    Allergies   Allergies as of 03/08/2024 - Review Complete 03/08/2024  Allergen Reaction Noted   Codeine Nausea And Vomiting 04/02/2011   Penicillins Hives, Rash, and Other (See Comments) 04/02/2011      Review of Systems   General: Negative for anorexia, weight loss, fever, chills, fatigue, weakness. ENT: Negative for hoarseness, difficulty swallowing , nasal congestion. CV: Negative for chest pain, angina, palpitations, dyspnea on  exertion, peripheral edema.  Respiratory: Negative for dyspnea at rest, dyspnea on exertion, cough, sputum, wheezing.  GI: See history of present illness. GU:  Negative for dysuria, hematuria, urinary incontinence, urinary frequency, nocturnal urination.  Endo: Negative for unusual weight change.     Physical Exam   BP 120/81 (BP Location: Right Arm, Patient Position: Sitting, Cuff Size: Normal)   Pulse 68   Temp 98.1 F (36.7 C) (Oral)   Ht 5' 4 (1.626 m)   Wt 150 lb 9.6 oz (68.3 kg)   SpO2 95%   BMI 25.85 kg/m    General: Well-nourished,  well-developed in no acute distress. Accompanied by her niece. Eyes: No icterus. Mouth: Oropharyngeal mucosa moist and pink   Lungs: Clear to auscultation bilaterally.  Heart: Regular rate and rhythm, no murmurs rubs or gallops.  Abdomen: Bowel sounds are normal, nontender, nondistended, no hepatosplenomegaly or masses,  no abdominal bruits or hernia , no rebound or guarding.  Rectal: not performed Extremities: No lower extremity edema. No clubbing or deformities. Small pea sized mobile nodule upper left arm, nontender Neuro: Alert and oriented x 4   Skin: Warm and dry, no jaundice.   Psych: Alert and cooperative, normal mood and affect.  Labs   Lab Results  Component Value Date   NA 138 12/22/2023   CL 103 12/22/2023   K 4.0 12/22/2023   CO2 23 12/22/2023   BUN 18 12/22/2023   CREATININE 0.82 12/22/2023   GFRNONAA >60 12/22/2023   CALCIUM  8.9 12/22/2023   PHOS 4.5 05/11/2021   ALBUMIN 4.1 12/22/2023   GLUCOSE 228 (H) 12/22/2023   Lab Results  Component Value Date   ALT 47 (H) 12/22/2023   AST 35 12/22/2023   ALKPHOS 53 12/22/2023   BILITOT 0.9 12/22/2023   Lab Results  Component Value Date   WBC 5.5 12/22/2023   HGB 14.2 12/22/2023   HCT 41.4 12/22/2023   MCV 89.4 12/22/2023   PLT 135 (L) 12/22/2023    Imaging Studies   CT CHEST LUNG CA SCREEN LOW DOSE W/O CM Result Date: 02/17/2024 CLINICAL DATA:  Lung cancer  screening. Former smoker, quit 6 months ago. Forty pack-year history. EXAM: CT CHEST WITHOUT CONTRAST LOW-DOSE FOR LUNG CANCER SCREENING TECHNIQUE: Multidetector CT imaging of the chest was performed following the standard protocol without IV contrast. RADIATION DOSE REDUCTION: This exam was performed according to the departmental dose-optimization program which includes automated exposure control, adjustment of the mA and/or kV according to patient size and/or use of iterative reconstruction technique. COMPARISON:  Lung cancer screening exam 02/16/2023 FINDINGS: Cardiovascular: The heart is upper normal in size. Faint coronary artery calcifications. Aortic tortuosity without aneurysm. Minimal atherosclerosis of the aorta. Mediastinum/Nodes: No enlarged mediastinal lymph nodes. No obvious bulky hilar adenopathy, detailed hilar assessment is limited in the absence of IV contrast. Surgical clips in the left axilla, no axillary adenopathy. Unremarkable appearance of the esophagus. Lungs/Pleura: Mild emphysema. No consolidative airspace disease. No pleural effusion. Stable 4.1 mm pulmonary nodule in the right lower lobe, series 4, image 194. No new or enlarging pulmonary nodules. Upper Abdomen: No acute upper abdominal findings. Musculoskeletal: There are no acute or suspicious osseous abnormalities. Incidental hemangioma within T9 vertebral body. IMPRESSION: 1. Lung-RADS 2, benign appearance or behavior. Continue annual screening with low-dose chest CT without contrast in 12 months. 2. Coronary artery calcifications. 3. Emphysema (ICD10-J43.9). Aortic Atherosclerosis (ICD10-I70.0). Electronically Signed   By: Andrea Gasman M.D.   On: 02/17/2024 13:20   DG Bone Density Result Date: 02/14/2024 EXAM: DUAL X-RAY ABSORPTIOMETRY (DXA) FOR BONE MINERAL DENSITY 02/14/2024 12:08 pm CLINICAL DATA:  73 year old Female Postmenopausal. Osteopenia - on AI Patient is or has been on bone building therapies. TECHNIQUE: An axial  (e.g., hips, spine) and/or appendicular (e.g., radius) exam was performed, as appropriate, using [employer] [employer] [facility]. Images are obtained for bone mineral density measurement and are not obtained for diagnostic purposes. [id number] Exclusions: Lumbar spine due to advanced degenerative changes. COMPARISON:  04/30/2021. FINDINGS: Scan quality: Good. LEFT FEMORAL NECK: BMD (in g/cm2): 0.714 T-score: -2.3 Z-score: -0.5 LEFT TOTAL  HIP: BMD (in g/cm2): 0.810 T-score: -1.6 Z-score: 0.0 RIGHT FEMORAL NECK: BMD (in g/cm2): 0.725 T-score: -2.3 Z-score: -0.4 RIGHT TOTAL HIP: BMD (in g/cm2): 0.772 T-score: -1.9 Z-score: -0.3 DUAL-FEMUR TOTAL MEAN: Rate of change from previous exam: No significant rate of change from previous exam. LEFT FOREARM (RADIUS 33%): BMD (in g/cm2): 0.622 T-score: -1.2 Z-score: 0.9 Rate of change from previous exam: No significant rate of change from previous exam. FRAX 10-YEAR PROBABILITY OF FRACTURE: FRAX not reported as the patient is receiving bone building therapy. IMPRESSION: Osteopenia based on BMD. Fracture risk is unknown due to history of bone building therapy. RECOMMENDATIONS: 1. All patients should optimize calcium  and vitamin D  intake. 2. Consider FDA-approved medical therapies in postmenopausal women and men aged 49 years and older, based on the following: - A hip or vertebral (clinical or morphometric) fracture - T-score less than or equal to -2.5 and secondary causes have been excluded. - Low bone mass (T-score between -1.0 and -2.5) and a 10-year probability of a hip fracture greater than or equal to 3% or a 10-year probability of a major osteoporosis-related fracture greater than or equal to 20% based on the [place] -adapted WHO algorithm. - Clinician judgment and/or patient preferences may indicate treatment for people with 10-year fracture probabilities above or below these levels 3. Patients with diagnosis of osteoporosis or at high risk for fracture should  have regular bone mineral density tests. For patients eligible for Medicare, routine testing is allowed once every 2 years. The testing frequency can be increased to one year for patients who have rapidly progressing disease, those who are receiving or discontinuing medical therapy to restore bone mass, or have additional risk factors. Electronically Signed   By: Harrietta Sherry M.D.   On: 02/14/2024 13:33    Assessment/Plan:    Cirrhosis, likely due to MASH: Cirrhosis has historically been well compensated. In 2021, she had mild portal HTN gastropathy on EGD. Recommended egd in 2 years, which has not been completed. No signs of hepatic encephalopathy such as lethargy or confusion. No blood in stool. Previous CT scan in June showed no new concerning findings. Regular monitoring is necessary to follow MELD and screen for hepatoma. She was last seen over a year ago, due to her sister being ill and unable to bring her. She was able to get her niece to bring her today. - Ordered CMET, PT/INR, CBC, AFP, Hep B surf ab - Scheduled RUQ U/S - offer EGD for esophageal variceal screening after labs and u/s. Could consider colonoscopy at the same time. - Continue regular follow-up every six months  Chronic constipation: Managed with Miralax  as needed. Bowel movements are not daily, but she adjusts Miralax  use based on bowel movement frequency. Emphasis on maintaining regular bowel movements to prevent toxin (ammonia) buildup. - Continue Miralax  daily as needed to maintain daily bowel movements. So far, no evidence of HE therefore will not change her bowel regimen.  Gastroesophageal reflux disease (GERD) GERD is managed with pantoprazole , which she continues to take regularly. No current symptoms of heartburn or indigestion reported. - Continue pantoprazole  40mg  daily   Left upper arm nodule: possible lymph node or lipoma. She has h/o left breast cancer. Recommend she discuss management with PCP, which she  has not done to date.      History of colon polyps: -due surveillance colonoscopy in 10/2024   Laurie Moreno, MHS, PA-C [facility]

## 2024-03-08 NOTE — Telephone Encounter (Signed)
 [facility]  US  scheduled for Thursday; 03/14/24, arrive at 10:15 am to check in, NPO after midnight.

## 2024-03-08 NOTE — Telephone Encounter (Signed)
 Pt informed of US  appointment at [facility]

## 2024-03-08 NOTE — Patient Instructions (Addendum)
 Ultrasound of the liver and labs to be done at [facility]. We will be in touch with results as available. Continue pantoprazole  once daily before breakfast for acid reflux. Use miralax  one capful once daily as needed to maintain daily bowel movements. We want you to have a bowel movement most days to keep the toxins down.  You will be due for colonoscopy next year. We have discussed having upper endoscopy to screen for varices (large blood vessels) due to your cirrhosis. This one can be done at any time. Let's wait for your labs and ultrasound to come back and we can determine if we want to move forward now or wait to have upper endoscopy with colonoscopy after your next office visit.  Always check your stool for black stools or blood in the stool. Let us  know if you see any of these color changes.  We will plan for office visit in six months or sooner if needed.

## 2024-03-14 ENCOUNTER — Other Ambulatory Visit (HOSPITAL_COMMUNITY)
Admission: RE | Admit: 2024-03-14 | Discharge: 2024-03-14 | Disposition: A | Discharge status: Home / Self Care | Source: Ambulatory Visit | Attending: Gastroenterology | Admitting: Gastroenterology

## 2024-03-14 ENCOUNTER — Ambulatory Visit (HOSPITAL_COMMUNITY)
Admission: RE | Admit: 2024-03-14 | Discharge: 2024-03-14 | Disposition: A | Discharge status: Home / Self Care | Source: Ambulatory Visit | Attending: Gastroenterology | Admitting: Gastroenterology

## 2024-03-14 DIAGNOSIS — K5900 Constipation, unspecified: Secondary | ICD-10-CM

## 2024-03-14 DIAGNOSIS — K219 Gastro-esophageal reflux disease without esophagitis: Secondary | ICD-10-CM | POA: Diagnosis present

## 2024-03-14 DIAGNOSIS — K7460 Unspecified cirrhosis of liver: Secondary | ICD-10-CM | POA: Diagnosis present

## 2024-03-14 LAB — COMPREHENSIVE METABOLIC PANEL WITH GFR
ALT: 75 U/L — ABNORMAL HIGH (ref 0–44)
AST: 51 U/L — ABNORMAL HIGH (ref 15–41)
Albumin: 4.5 g/dL (ref 3.5–5.0)
Alkaline Phosphatase: 70 U/L (ref 38–126)
Anion gap: 10 (ref 5–15)
BUN: 16 mg/dL (ref 8–23)
CO2: 29 mmol/L (ref 22–32)
Calcium: 9.3 mg/dL (ref 8.9–10.3)
Chloride: 104 mmol/L (ref 98–111)
Creatinine, Ser: 0.82 mg/dL (ref 0.44–1.00)
GFR, Estimated: >60 mL/min (ref >60)
Glucose, Bld: 147 mg/dL — ABNORMAL HIGH (ref 70–99)
Potassium: 4.8 mmol/L (ref 3.5–5.1)
Sodium: 143 mmol/L (ref 135–145)
Total Bilirubin: 0.6 mg/dL (ref 0.0–1.2)
Total Protein: 7 g/dL (ref 6.5–8.1)

## 2024-03-14 LAB — CBC WITH DIFFERENTIAL/PLATELET
Abs Immature Granulocytes: 0.01 K/uL (ref 0.00–0.07)
Basophils Absolute: 0.1 K/uL (ref 0.0–0.1)
Basophils Relative: 1 %
Eosinophils Absolute: 0.1 K/uL (ref 0.0–0.5)
Eosinophils Relative: 2 %
HCT: 39.5 % (ref 36.0–46.0)
Hemoglobin: 13.2 g/dL (ref 12.0–15.0)
Immature Granulocytes: 0 %
Lymphocytes Relative: 41 %
Lymphs Abs: 1.5 K/uL (ref 0.7–4.0)
MCH: 30 pg (ref 26.0–34.0)
MCHC: 33.4 g/dL (ref 30.0–36.0)
MCV: 89.8 fL (ref 80.0–100.0)
Monocytes Absolute: 0.5 K/uL (ref 0.1–1.0)
Monocytes Relative: 13 %
Neutro Abs: 1.6 K/uL — ABNORMAL LOW (ref 1.7–7.7)
Neutrophils Relative %: 43 %
Platelets: 113 K/uL — ABNORMAL LOW (ref 150–400)
RBC: 4.4 MIL/uL (ref 3.87–5.11)
RDW: 12.6 % (ref 11.5–15.5)
WBC: 3.7 K/uL — ABNORMAL LOW (ref 4.0–10.5)
nRBC: 0 % (ref 0.0–0.2)

## 2024-03-14 LAB — PROTIME-INR
INR: 1.1 (ref 0.8–1.2)
Prothrombin Time: 14.6 s (ref 11.4–15.2)

## 2024-03-14 LAB — HEPATITIS B SURFACE ANTIBODY,QUALITATIVE: Hep B S Ab: NONREACTIVE

## 2024-03-15 LAB — AFP TUMOR MARKER: AFP, Serum, Tumor Marker: <1.8 ng/mL (ref 0.0–9.2)

## 2024-03-21 ENCOUNTER — Ambulatory Visit: Payer: Self-pay | Admitting: Gastroenterology

## 2024-03-21 DIAGNOSIS — K7460 Unspecified cirrhosis of liver: Secondary | ICD-10-CM

## 2024-03-28 ENCOUNTER — Encounter: Payer: Self-pay | Admitting: *Deleted

## 2024-04-05 NOTE — Telephone Encounter (Signed)
 Pt called stating that she had another Hep B shot on 04/03/2024 at [facility] to try to obtain immunity. Hep B surface antibody lab was ordered and will be mailed to the pt to have done in 8 weeks.

## 2024-04-17 ENCOUNTER — Encounter (HOSPITAL_COMMUNITY)
Admission: RE | Admit: 2024-04-17 | Discharge: 2024-04-17 | Disposition: A | Source: Ambulatory Visit | Attending: Internal Medicine

## 2024-04-20 ENCOUNTER — Other Ambulatory Visit: Payer: Self-pay

## 2024-04-20 ENCOUNTER — Encounter (HOSPITAL_COMMUNITY): Payer: Self-pay | Admitting: Internal Medicine

## 2024-04-21 ENCOUNTER — Other Ambulatory Visit: Payer: Self-pay

## 2024-04-21 ENCOUNTER — Encounter (HOSPITAL_COMMUNITY): Admission: RE | Disposition: A | Payer: Self-pay | Source: Home / Self Care | Attending: Internal Medicine

## 2024-04-21 ENCOUNTER — Ambulatory Visit (HOSPITAL_COMMUNITY): Admitting: Anesthesiology

## 2024-04-21 ENCOUNTER — Ambulatory Visit (HOSPITAL_COMMUNITY)
Admission: RE | Admit: 2024-04-21 | Discharge: 2024-04-21 | Disposition: A | Discharge status: Home / Self Care | Attending: Internal Medicine | Admitting: Internal Medicine

## 2024-04-21 DIAGNOSIS — K7460 Unspecified cirrhosis of liver: Secondary | ICD-10-CM | POA: Diagnosis not present

## 2024-04-21 DIAGNOSIS — I110 Hypertensive heart disease with heart failure: Secondary | ICD-10-CM | POA: Diagnosis not present

## 2024-04-21 DIAGNOSIS — Z87891 Personal history of nicotine dependence: Secondary | ICD-10-CM | POA: Insufficient documentation

## 2024-04-21 DIAGNOSIS — E039 Hypothyroidism, unspecified: Secondary | ICD-10-CM | POA: Insufficient documentation

## 2024-04-21 DIAGNOSIS — F419 Anxiety disorder, unspecified: Secondary | ICD-10-CM | POA: Insufficient documentation

## 2024-04-21 DIAGNOSIS — K3189 Other diseases of stomach and duodenum: Secondary | ICD-10-CM | POA: Insufficient documentation

## 2024-04-21 DIAGNOSIS — I509 Heart failure, unspecified: Secondary | ICD-10-CM

## 2024-04-21 DIAGNOSIS — J449 Chronic obstructive pulmonary disease, unspecified: Secondary | ICD-10-CM | POA: Diagnosis not present

## 2024-04-21 DIAGNOSIS — Z13810 Encounter for screening for upper gastrointestinal disorder: Secondary | ICD-10-CM | POA: Diagnosis present

## 2024-04-21 DIAGNOSIS — K766 Portal hypertension: Secondary | ICD-10-CM | POA: Diagnosis not present

## 2024-04-21 DIAGNOSIS — F319 Bipolar disorder, unspecified: Secondary | ICD-10-CM | POA: Diagnosis not present

## 2024-04-21 DIAGNOSIS — K219 Gastro-esophageal reflux disease without esophagitis: Secondary | ICD-10-CM | POA: Diagnosis not present

## 2024-04-21 HISTORY — PX: ESOPHAGOGASTRODUODENOSCOPY: SHX5428

## 2024-04-21 SURGERY — EGD (ESOPHAGOGASTRODUODENOSCOPY)
Anesthesia: Monitor Anesthesia Care

## 2024-04-21 MED ORDER — PROPOFOL 10 MG/ML IV BOLUS
INTRAVENOUS | Status: DC | PRN
  Administered 2024-04-21: 100 mg via INTRAVENOUS

## 2024-04-21 MED ORDER — LACTATED RINGERS IV SOLN: INTRAVENOUS | Status: DC

## 2024-04-21 MED ORDER — LIDOCAINE HCL (CARDIAC) PF 100 MG/5ML IV SOSY
PREFILLED_SYRINGE | INTRAVENOUS | Status: DC | PRN
  Administered 2024-04-21: 50 mg via INTRAVENOUS

## 2024-04-21 NOTE — Anesthesia Preprocedure Evaluation (Signed)
"                                    Anesthesia Evaluation  Patient identified by MRN, date of birth, ID band Patient awake    Reviewed: Allergy & Precautions, H&P , NPO status , Patient's Chart, lab work & pertinent test results, reviewed documented beta blocker date and time   History of Anesthesia Complications (+) Family history of anesthesia reaction and history of anesthetic complications  Airway Mallampati: II  TM Distance: >3 FB Neck ROM: full    Dental no notable dental hx.    Pulmonary COPD, former smoker   Pulmonary exam normal breath sounds clear to auscultation       Cardiovascular Exercise Tolerance: Good hypertension, +CHF  + dysrhythmias  Rhythm:regular Rate:Normal     Neuro/Psych  PSYCHIATRIC DISORDERS Anxiety  Bipolar Disorder   negative neurological ROS     GI/Hepatic Neg liver ROS,GERD  ,,  Endo/Other  Hypothyroidism    Renal/GU Renal disease  negative genitourinary   Musculoskeletal   Abdominal   Peds  Hematology  (+) Blood dyscrasia, anemia   Anesthesia Other Findings   Reproductive/Obstetrics negative OB ROS                              Anesthesia Physical Anesthesia Plan  ASA: 3  Anesthesia Plan: MAC   Post-op Pain Management:    Induction:   PONV Risk Score and Plan: Propofol  infusion  Airway Management Planned:   Additional Equipment:   Intra-op Plan:   Post-operative Plan:   Informed Consent: I have reviewed the patients History and Physical, chart, labs and discussed the procedure including the risks, benefits and alternatives for the proposed anesthesia with the patient or authorized representative who has indicated his/her understanding and acceptance.     Dental Advisory Given  Plan Discussed with: CRNA  Anesthesia Plan Comments:         Anesthesia Quick Evaluation  "

## 2024-04-21 NOTE — Op Note (Signed)
 [facility] Patient Name: Laurie Moreno Procedure Date: 04/21/2024 10:49 AM MRN: [id number] Date of Birth: January 22, 1951 Attending MD: Carlin POUR. Cindie , [place], [id number] CSN: [id number] Age: 73 Admit Type: Outpatient Procedure:                Upper GI endoscopy Indications:              Screening procedure, Cirrhosis rule out esophageal                            varices Providers:                Carlin POUR. Cindie, DO, Tammy Vaught, RN, Bascom Blush Referring MD:              Medicines:                See the Anesthesia note for documentation of the                            administered medications Complications:            No immediate complications. Estimated Blood Loss:     Estimated blood loss: none. Procedure:                Pre-Anesthesia Assessment:                           - The anesthesia plan was to use monitored                            anesthesia care (MAC).                           After obtaining informed consent, the endoscope was                            passed under direct vision. Throughout the                            procedure, the patient's blood pressure, pulse, and                            oxygen saturations were monitored continuously. The                            [id number] ([id number]) Upper was introduced through                            the mouth, and advanced to the second part of                            duodenum. The upper GI endoscopy was accomplished                            without difficulty. The patient tolerated the  procedure well. Scope In: 11:06:50 AM Scope Out: 11:09:39 AM Total Procedure Duration: 0 hours 2 minutes 49 seconds  Findings:      There is no endoscopic evidence of varices in the entire esophagus.      Mild portal hypertensive gastropathy was found in the gastric body.      There is no endoscopic evidence of varices in the entire examined       stomach.       The duodenal bulb, first portion of the duodenum and second portion of       the duodenum were normal. Impression:               - Portal hypertensive gastropathy.                           - Normal duodenal bulb, first portion of the                            duodenum and second portion of the duodenum.                           - No specimens collected. Moderate Sedation:      Per Anesthesia Care Recommendation:           - Patient has a contact number available for                            emergencies. The signs and symptoms of potential                            delayed complications were discussed with the                            patient. Return to normal activities tomorrow.                            Written discharge instructions were provided to the                            patient.                           - Resume previous diet.                           - Continue present medications.                           - Return to GI clinic in 3 months.                           - Consider switching metoprolol to carvedilol  to                            reduce risk of liver decompensation.                           - Repeat upper endoscopy  in 2 years for screening                            purposes. Procedure Code(s):        --- Professional ---                           [phone], Esophagogastroduodenoscopy, flexible,                            transoral; diagnostic, including collection of                            specimen(s) by brushing or washing, when performed                            (separate procedure) Diagnosis Code(s):        --- Professional ---                           K76.6, Portal hypertension                           K31.89, Other diseases of stomach and duodenum                           Z13.810, Encounter for screening for upper                            gastrointestinal disorder                           K74.60, Unspecified cirrhosis of liver CPT  copyright 2022 American Medical Association. All rights reserved. The codes documented in this report are preliminary and upon coder review may  be revised to meet current compliance requirements. Carlin POUR. Cindie, DO Carlin POUR. Monita Swier, DO 04/21/2024 11:18:38 AM This report has been signed electronically. Number of Addenda: 0

## 2024-04-21 NOTE — Discharge Instructions (Signed)
 EGD Discharge instructions Please read the instructions outlined below and refer to this sheet in the next few weeks. These discharge instructions provide you with general information on caring for yourself after you leave the hospital. Your doctor may also give you specific instructions. While your treatment has been planned according to the most current medical practices available, unavoidable complications occasionally occur. If you have any problems or questions after discharge, please call your doctor. ACTIVITY You may resume your regular activity but move at a slower pace for the next 24 hours.  Take frequent rest periods for the next 24 hours.  Walking will help expel (get rid of) the air and reduce the bloated feeling in your abdomen.  No driving for 24 hours (because of the anesthesia (medicine) used during the test).  You may shower.  Do not sign any important legal documents or operate any machinery for 24 hours (because of the anesthesia used during the test).  NUTRITION Drink plenty of fluids.  You may resume your normal diet.  Begin with a light meal and progress to your normal diet.  Avoid alcoholic beverages for 24 hours or as instructed by your caregiver.  MEDICATIONS You may resume your normal medications unless your caregiver tells you otherwise.  WHAT YOU CAN EXPECT TODAY You may experience abdominal discomfort such as a feeling of fullness or gas pains.  FOLLOW-UP Your doctor will discuss the results of your test with you.  SEEK IMMEDIATE MEDICAL ATTENTION IF ANY OF THE FOLLOWING OCCUR: Excessive nausea (feeling sick to your stomach) and/or vomiting.  Severe abdominal pain and distention (swelling).  Trouble swallowing.  Temperature over 101 F (37.8 C).  Rectal bleeding or vomiting of blood.   Your upper endoscopy did not reveal any evidence of esophageal varices, which is great. Continue current medications.  We will plan on repeating upper endoscopy in 2 years.   Follow-up in GI office in 3 months.   I hope you have a great rest of your week!  Carlin POUR. Cindie, D.O. Gastroenterology and Hepatology [facility]

## 2024-04-21 NOTE — Transfer of Care (Signed)
 Immediate Anesthesia Transfer of Care Note  Patient: Laurie Moreno  Procedure(s) Performed: EGD (ESOPHAGOGASTRODUODENOSCOPY)  Patient Location: Short Stay  Anesthesia Type:General  Level of Consciousness: drowsy, patient cooperative, and responds to stimulation  Airway & Oxygen Therapy: Patient Spontanous Breathing and Patient connected to nasal cannula oxygen  Post-op Assessment: Report given to RN and Post -op Vital signs reviewed and stable  Post vital signs: Reviewed and stable  Last Vitals:  Vitals Value Taken Time  BP 124/78   Temp 97.6   Pulse 76   Resp 16   SpO2 95     Last Pain:  Vitals:   04/21/24 1100  TempSrc:   PainSc: 0-No pain         Complications: No notable events documented.

## 2024-04-21 NOTE — H&P (Signed)
 " Primary Care Physician:  Halbert Mariano SQUIBB, DO Primary Gastroenterologist:  Dr. Cindie  Pre-Procedure History & Physical: HPI:  Laurie Moreno is a 73 y.o. female is here for an EGD  to be performed for cirrhosis, variceal screening   Past Medical History:  Diagnosis Date   Acute pyelonephritis    Anxiety    Bipolar disorder, unspecified (HCC)    From [place] [facility] form   Breast cancer (HCC) 01/2019   INVASIVE CARCINOMA OF THE BREAST   CHF (congestive heart failure) (HCC)    see LOV 06-25-16 epic care everywhere ; sister reports that patient has a 'weak heart'  but they did testing and she hasnt had to go back since   Chronic respiratory failure with hypoxia (HCC)    Complication of anesthesia    reports with last surgery on 09-07-17 it took 3 hours for her to wake up and she was on the ventilator post operatively for an additional  8 hours    COPD (chronic obstructive pulmonary disease) (HCC)    Dermatitis    Dermatitis    Dysrhythmia    Elevated temperature 10/26/2017   temp of 99.9 , denies cold/flu sx; reports she gets frwquent UTIs, patient sister accompanying today  reports she is going to Dr Carolee (surgeon) office today  for pre-op appt  at 2pm   Essential tremor    Family history of adverse reaction to anesthesia    sister gets nausea and vomiting    Fever    History of ESBL E. coli infection    10-26-17 this RN did not enter this ESBL inf hx; patient and sister deny hx of ESBL infection and there is no supporting lab work for ESBL infection in epic    Hyperlipidemia    Hypothyroidism    Thrombocytopenia    Unspecified hydronephrosis    Urinary incontinence     Past Surgical History:  Procedure Laterality Date   BIOPSY  10/12/2019   Procedure: BIOPSY;  Surgeon: Shaaron Lamar HERO, MD;  Location: [facility];  Service: Endoscopy;;   CARDIAC CATHETERIZATION  07/15/2016   at [facility]   COLONOSCOPY  12/2013   Dr. Redell C. Lewis at [facility], long tortuous colon, nonbleeding external hemorrhoids, questionable small subcentimeter benign/hyperplastic polyp in the ascending colon, not removed.  Advised to have repeat colonoscopy in 4 years.   COLONOSCOPY WITH PROPOFOL  N/A 10/12/2019   [place]: 1.5 cm verrucous, raised nodule upper end of her gluteal folds.  Scattered diverticula.  3 sessile polyps removed, tubular adenomas.  Next colonoscopy in 5 years.   CYSTOSCOPY W/ URETERAL STENT PLACEMENT Left 09/07/2017   Procedure: CYSTOSCOPY WITH RETROGRADE PYELOGRAM/URETERAL STENT PLACEMENT;  Surgeon: Carolee Sherwood JONETTA DOUGLAS, MD;  Location: [facility];  Service: Urology;  Laterality: Left;   ESOPHAGOGASTRODUODENOSCOPY (EGD) WITH PROPOFOL  N/A 10/12/2019   [place]: Portal hypertensive gastropathy.  Linear gastric erosions.  No varices.  Biopsy showed reactive gastropathy.  No H. pylori.  Repeat EGD in 2 years to screen for varices.   EYE SURGERY  5 years ago    bilateral cataract extraction    eyelid lift Bilateral    LAPAROSCOPIC NEPHRECTOMY, HAND ASSISTED Left 11/01/2017   Procedure: LEFT HAND ASSISTED LAPAROSCOPIC NEPHRECTOMY;  Surgeon: Carolee Sherwood JONETTA DOUGLAS, MD;  Location: [facility];  Service: Urology;  Laterality: Left;   MASTECTOMY Left 01/2019   MASTECTOMY MODIFIED RADICAL Left 01/23/2019   Procedure: LEFT MODIFIED RADICAL MASTECTOMY;  Surgeon: Mavis Anes, MD;  Location: [facility];  Service: General;  Laterality: Left;   ORIF ANKLE FRACTURE Right 05/10/2021   Procedure: OPEN REDUCTION INTERNAL FIXATION (ORIF) ANKLE FRACTURE;  Surgeon: Edna Toribio LABOR, MD;  Location: [facility];  Service: Orthopedics;  Laterality: Right;   POLYPECTOMY  10/12/2019   Procedure: POLYPECTOMY;  Surgeon: Shaaron Lamar HERO, MD;  Location: [facility];  Service: Endoscopy;;    Prior to Admission medications  Medication Sig Start Date End Date Taking? Authorizing Provider  acetaminophen  (TYLENOL ) 500 MG tablet Take by mouth.   Yes [provider]  albuterol   (VENTOLIN  HFA) 108 (90 Base) MCG/ACT inhaler Inhale 2 puffs into the lungs every 4 (four) hours as needed for wheezing or shortness of breath.   Yes [provider]  alendronate  (FOSAMAX ) 70 MG tablet Take 1 tablet (70 mg total) by mouth once a week. Take with a full glass of water on an empty stomach. 06/29/23  Yes Rogers Hai, MD  anastrozole  (ARIMIDEX ) 1 MG tablet Take 1 tablet (1 mg total) by mouth daily. 01/31/24  Yes Burns, Delon BRAVO, NP  atorvastatin  (LIPITOR) 80 MG tablet Take 80 mg by mouth daily. 02/14/24  Yes [provider]  budesonide-formoterol (SYMBICORT) 80-4.5 MCG/ACT inhaler Inhale 2 puffs into the lungs 2 (two) times daily.   Yes [provider]  busPIRone (BUSPAR) 10 MG tablet Take 10 mg by mouth 3 (three) times daily.   Yes [provider]  Calcium  Carbonate-Vit D-Min (CALCIUM  600+D3 PLUS MINERALS) 600-800 MG-UNIT CHEW Chew 1 tablet by mouth 2 (two) times daily.   Yes [provider]  Cholecalciferol  (VITAMIN D ) 50 MCG (2000 UT) tablet Take 2,000 Units by mouth daily.   Yes [provider]  clonazePAM  (KLONOPIN ) 0.5 MG tablet Take 1 tablet (0.5 mg total) by mouth 2 (two) times daily. 09/17/17  Yes Lassen, Arlo C, PA-C  escitalopram  (LEXAPRO ) 20 MG tablet Take 20 mg by mouth daily. 12/20/18  Yes [provider]  famotidine  (PEPCID ) 20 MG tablet 1 tablet daily Orally Once a day; Duration: 30 days 12/16/23  Yes [provider]  fluticasone (FLONASE) 50 MCG/ACT nasal spray Place 2 sprays into both nostrils daily.   Yes [provider]  gabapentin  (NEURONTIN ) 300 MG capsule Take 300 mg by mouth at bedtime.   Yes [provider]  hydrOXYzine  (ATARAX /VISTARIL ) 25 MG tablet Take 25 mg by mouth every 8 (eight) hours as needed for itching.    Yes [provider]  levothyroxine  (SYNTHROID , LEVOTHROID) 125 MCG tablet Take 125 mcg by mouth daily before breakfast.   Yes [provider]  lisinopril (ZESTRIL) 5 MG tablet Take 5 mg by mouth daily. 01/08/24  Yes [provider]  melatonin 1 MG TABS tablet 2 tablet at bedtime as needed Orally Once a day   Yes [provider]  metoprolol tartrate (LOPRESSOR) 25 MG tablet Take 25 mg by mouth 2 (two) times daily. 04/15/22  Yes [provider]  Multiple Vitamin (MULTIVITAMIN) tablet Take 1 tablet by mouth every morning.    Yes [provider]  oxybutynin  (DITROPAN -XL) 5 MG 24 hr tablet Take 5 mg by mouth daily. 04/22/21  Yes [provider]  pantoprazole  (PROTONIX ) 40 MG tablet TAKE 1 TABLET BY MOUTH EVERY DAY BEFORE BREAKFAST 02/27/21  Yes Ezzard Sonny RAMAN, PA-C  primidone  (MYSOLINE ) 50 MG tablet Take 100 mg by mouth 2 (two) times daily.    Yes [provider]  QUEtiapine (SEROQUEL) 25 MG tablet 1 tablet  in the morning, 2 tablets at bedtime Orally; Duration: 90 days 12/08/23  Yes [provider]  SPIRIVA HANDIHALER 18 MCG inhalation capsule 1 capsule daily. 09/25/21  Yes [provider]  carbidopa -levodopa  (SINEMET  IR) 25-100 MG tablet Take 1.5 tablets by mouth 3 (three) times daily. 10/04/18 03/08/24  [provider]    Allergies as of 03/28/2024 - Review Complete 03/08/2024  Allergen Reaction Noted   Codeine Nausea And Vomiting 04/02/2011   Penicillins Hives, Rash, and Other (See Comments) 04/02/2011    Family History  Problem Relation Age of Onset   Colon cancer Mother        died at age 75   Colon cancer Father        died in his 29s   Tremor Father    Melanoma Brother    Colon cancer Paternal Grandmother        age 74   Colon cancer Paternal Grandfather        in his 23s   Liver cancer Maternal Grandfather        died age 32    Social History   Socioeconomic History   Marital status: Single    Spouse name: Not on file   Number of children: 1   Years of education: Not on file   Highest education level: Not on file  Occupational History    Occupation: disability  Tobacco Use   Smoking status: Former    Current packs/day: 0.50    Average packs/day: 0.5 packs/day for 45.0 years (22.5 ttl pk-yrs)    Types: Cigarettes    Passive exposure: Current   Smokeless tobacco: Never   Tobacco comments:    smokes 8 cigarettes per day 02/01/2020  Vaping Use   Vaping status: Never Used  Substance and Sexual Activity   Alcohol  use: Not Currently    Comment: no prior history heavy etoh use   Drug use: Not Currently   Sexual activity: Not Currently  Other Topics Concern   Not on file  Social History Narrative   Not on file   Social Drivers of Health   Tobacco Use: Medium Risk (04/20/2024)   Patient History    Smoking Tobacco Use: Former    Smokeless Tobacco Use: Never    Passive Exposure: Current  Programmer, Applications: Not on file  Food Insecurity: Not on file  Transportation Needs: Not on file  Physical Activity: Not on file  Stress: Not on file  Social Connections: Not on file  Intimate Partner Violence: Not on file  Depression (PHQ2-9): Low Risk (12/29/2023)   Depression (PHQ2-9)    PHQ-2 Score: 0  Alcohol  Screen: Not on file  Housing: Not on file  Utilities: Not on file  Health Literacy: Not on file    Review of Systems: General: Negative for fever, chills, fatigue, weakness. Eyes: Negative for vision changes.  ENT: Negative for hoarseness, difficulty swallowing , nasal congestion. CV: Negative for chest pain, angina, palpitations, dyspnea on exertion, peripheral edema.  Respiratory: Negative for dyspnea at rest, dyspnea on exertion, cough, sputum, wheezing.  GI: See history of present illness. GU:  Negative for dysuria, hematuria, urinary incontinence, urinary frequency, nocturnal urination.  MS: Negative for joint pain, low back pain.  Derm: Negative for rash or itching.  Neuro: Negative for weakness, abnormal sensation, seizure, frequent headaches, memory loss, confusion.  Psych: Negative for anxiety,  depression Endo: Negative for unusual weight change.  Heme: Negative for bruising or bleeding. Allergy: Negative for rash or hives.  Physical Exam: Vital signs in last 24 hours: Temp:  [98.8 F (37.1 C)] 98.8 F (37.1 C) (12/19 1010) Pulse Rate:  [63] 63 (12/19 1010) Resp:  [22] 22 (12/19 1010) BP: (124)/(82) 124/82 (12/19 1010) SpO2:  [93 %] 93 % (12/19 1010) Weight:  [68.5 kg] 68.5 kg (12/19 1009)   General:   Alert,  Well-developed, well-nourished, pleasant and cooperative in NAD Head:  Normocephalic and atraumatic. Eyes:  Sclera clear, no icterus.   Conjunctiva pink. Ears:  Normal auditory acuity. Nose:  No deformity, discharge,  or lesions. Msk:  Symmetrical without gross deformities. Normal posture. Extremities:  Without clubbing or edema. Neurologic:  Alert and  oriented x4;  grossly normal neurologically. Skin:  Intact without significant lesions or rashes. Psych:  Alert and cooperative. Normal mood and affect.   Impression/Plan: Laurie Moreno is here for an EGD to be performed for cirrhosis, variceal screening   Risks, benefits, limitations, imponderables and alternatives regarding procedure have been reviewed with the patient. Questions have been answered. All parties agreeable.  "

## 2024-04-23 NOTE — Anesthesia Postprocedure Evaluation (Signed)
"   Anesthesia Post Note  Patient: Laurie Moreno  Procedure(s) Performed: EGD (ESOPHAGOGASTRODUODENOSCOPY)  Patient location during evaluation: Phase II Anesthesia Type: MAC Level of consciousness: awake Pain management: pain level controlled Vital Signs Assessment: post-procedure vital signs reviewed and stable Respiratory status: spontaneous breathing and respiratory function stable Cardiovascular status: blood pressure returned to baseline and stable Postop Assessment: no headache and no apparent nausea or vomiting Anesthetic complications: no Comments: Late entry   No notable events documented.   Last Vitals:  Vitals:   04/21/24 1113 04/21/24 1129  BP: (!) 146/77 (!) 141/76  Pulse: 73   Resp: 18   Temp: 36.9 C   SpO2: 95% 97%    Last Pain:  Vitals:   04/21/24 1129  TempSrc:   PainSc: 0-No pain                 Laurie Moreno      "

## 2024-04-24 ENCOUNTER — Encounter (HOSPITAL_COMMUNITY): Payer: Self-pay | Admitting: Internal Medicine

## 2024-05-04 ENCOUNTER — Encounter: Payer: Self-pay | Admitting: Gastroenterology

## 2024-05-08 ENCOUNTER — Other Ambulatory Visit: Payer: Self-pay

## 2024-05-08 DIAGNOSIS — K7460 Unspecified cirrhosis of liver: Secondary | ICD-10-CM

## 2024-05-11 ENCOUNTER — Other Ambulatory Visit: Payer: Self-pay | Admitting: *Deleted

## 2024-05-11 DIAGNOSIS — M85852 Other specified disorders of bone density and structure, left thigh: Secondary | ICD-10-CM

## 2024-05-11 MED ORDER — ALENDRONATE SODIUM 70 MG PO TABS: 70.0000 mg | ORAL_TABLET | ORAL | 0 refills | Status: AC

## 2024-05-27 LAB — HEPATIC FUNCTION PANEL
ALT: 41 [IU]/L — ABNORMAL HIGH (ref 0–32)
AST: 31 [IU]/L (ref 0–40)
Albumin: 4.3 g/dL (ref 3.8–4.8)
Alkaline Phosphatase: 64 [IU]/L (ref 49–135)
Bilirubin Total: 0.7 mg/dL (ref 0.0–1.2)
Bilirubin, Direct: 0.23 mg/dL (ref 0.00–0.40)
Total Protein: 6.7 g/dL (ref 6.0–8.5)

## 2024-05-27 LAB — HEPATITIS B SURFACE ANTIBODY,QUALITATIVE: Hep B Surface Ab, Qual: NONREACTIVE

## 2024-05-29 ENCOUNTER — Ambulatory Visit: Payer: Self-pay | Admitting: Gastroenterology

## 2024-06-21 ENCOUNTER — Inpatient Hospital Stay

## 2024-07-05 ENCOUNTER — Inpatient Hospital Stay: Admitting: Oncology

## 2024-07-05 ENCOUNTER — Ambulatory Visit: Admitting: Oncology

## 2024-09-18 ENCOUNTER — Ambulatory Visit: Admitting: Gastroenterology
